# Patient Record
Sex: Female | Born: 1980
Health system: Southern US, Community
[De-identification: ages and names within clinical notes are randomized; demographics above are authoritative.]

## PROBLEM LIST (undated history)

## (undated) ENCOUNTER — Inpatient Hospital Stay (HOSPITAL_COMMUNITY): Admission: AD | Payer: Federal, State, Local not specified - Other | Admitting: Psychiatry

## (undated) DIAGNOSIS — J45909 Unspecified asthma, uncomplicated: Secondary | ICD-10-CM

## (undated) DIAGNOSIS — K589 Irritable bowel syndrome without diarrhea: Secondary | ICD-10-CM

## (undated) DIAGNOSIS — J069 Acute upper respiratory infection, unspecified: Secondary | ICD-10-CM

## (undated) DIAGNOSIS — T8859XA Other complications of anesthesia, initial encounter: Secondary | ICD-10-CM

## (undated) DIAGNOSIS — S069X9A Unspecified intracranial injury with loss of consciousness of unspecified duration, initial encounter: Secondary | ICD-10-CM

## (undated) DIAGNOSIS — G4730 Sleep apnea, unspecified: Secondary | ICD-10-CM

## (undated) DIAGNOSIS — J309 Allergic rhinitis, unspecified: Secondary | ICD-10-CM

## (undated) DIAGNOSIS — F329 Major depressive disorder, single episode, unspecified: Secondary | ICD-10-CM

## (undated) DIAGNOSIS — F988 Other specified behavioral and emotional disorders with onset usually occurring in childhood and adolescence: Secondary | ICD-10-CM

## (undated) DIAGNOSIS — R519 Headache, unspecified: Secondary | ICD-10-CM

## (undated) DIAGNOSIS — K5900 Constipation, unspecified: Secondary | ICD-10-CM

## (undated) DIAGNOSIS — R5383 Other fatigue: Secondary | ICD-10-CM

## (undated) DIAGNOSIS — J039 Acute tonsillitis, unspecified: Secondary | ICD-10-CM

## (undated) DIAGNOSIS — R4020 Unspecified coma: Secondary | ICD-10-CM

## (undated) DIAGNOSIS — R51 Headache: Secondary | ICD-10-CM

## (undated) DIAGNOSIS — D649 Anemia, unspecified: Secondary | ICD-10-CM

## (undated) DIAGNOSIS — T4145XA Adverse effect of unspecified anesthetic, initial encounter: Secondary | ICD-10-CM

## (undated) DIAGNOSIS — F411 Generalized anxiety disorder: Secondary | ICD-10-CM

## (undated) DIAGNOSIS — R5381 Other malaise: Secondary | ICD-10-CM

## (undated) DIAGNOSIS — L989 Disorder of the skin and subcutaneous tissue, unspecified: Secondary | ICD-10-CM

## (undated) DIAGNOSIS — H6690 Otitis media, unspecified, unspecified ear: Secondary | ICD-10-CM

## (undated) DIAGNOSIS — E785 Hyperlipidemia, unspecified: Secondary | ICD-10-CM

## (undated) HISTORY — DX: Major depressive disorder, single episode, unspecified: F32.9

## (undated) HISTORY — DX: Hyperlipidemia, unspecified: E78.5

## (undated) HISTORY — DX: Acute tonsillitis, unspecified: J03.90

## (undated) HISTORY — DX: Allergic rhinitis, unspecified: J30.9

## (undated) HISTORY — DX: Otitis media, unspecified, unspecified ear: H66.90

## (undated) HISTORY — DX: Other malaise: R53.81

## (undated) HISTORY — DX: Disorder of the skin and subcutaneous tissue, unspecified: L98.9

## (undated) HISTORY — DX: Constipation, unspecified: K59.00

## (undated) HISTORY — DX: Generalized anxiety disorder: F41.1

## (undated) HISTORY — DX: Acute upper respiratory infection, unspecified: J06.9

## (undated) HISTORY — DX: Other specified behavioral and emotional disorders with onset usually occurring in childhood and adolescence: F98.8

## (undated) HISTORY — DX: Other fatigue: R53.83

## (undated) HISTORY — PX: OTHER SURGICAL HISTORY: SHX169

---
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
## 2002-11-13 ENCOUNTER — Other Ambulatory Visit: Admission: RE | Admit: 2002-11-13 | Discharge: 2002-11-13 | Payer: Self-pay | Admitting: Obstetrics and Gynecology

## 2003-05-16 ENCOUNTER — Inpatient Hospital Stay (HOSPITAL_COMMUNITY): Admission: AC | Admit: 2003-05-16 | Discharge: 2003-05-22 | Payer: Self-pay

## 2003-05-26 ENCOUNTER — Emergency Department (HOSPITAL_COMMUNITY): Admission: EM | Admit: 2003-05-26 | Discharge: 2003-05-26 | Payer: Self-pay | Admitting: Emergency Medicine

## 2004-09-11 ENCOUNTER — Ambulatory Visit: Payer: Self-pay | Admitting: Internal Medicine

## 2005-12-22 ENCOUNTER — Ambulatory Visit: Payer: Self-pay | Admitting: Internal Medicine

## 2007-01-27 HISTORY — PX: CERVICAL POLYPECTOMY: SHX88

## 2007-03-30 ENCOUNTER — Encounter: Admission: RE | Admit: 2007-03-30 | Discharge: 2007-03-30 | Payer: Self-pay | Admitting: Internal Medicine

## 2007-09-28 ENCOUNTER — Encounter: Admission: RE | Admit: 2007-09-28 | Discharge: 2007-09-28 | Payer: Self-pay | Admitting: Internal Medicine

## 2008-09-26 ENCOUNTER — Ambulatory Visit: Payer: Self-pay | Admitting: Internal Medicine

## 2008-09-26 DIAGNOSIS — L989 Disorder of the skin and subcutaneous tissue, unspecified: Secondary | ICD-10-CM | POA: Insufficient documentation

## 2008-09-26 DIAGNOSIS — K5900 Constipation, unspecified: Secondary | ICD-10-CM

## 2008-09-26 DIAGNOSIS — J309 Allergic rhinitis, unspecified: Secondary | ICD-10-CM

## 2008-09-26 DIAGNOSIS — F329 Major depressive disorder, single episode, unspecified: Secondary | ICD-10-CM

## 2008-09-26 DIAGNOSIS — E785 Hyperlipidemia, unspecified: Secondary | ICD-10-CM | POA: Insufficient documentation

## 2008-09-26 DIAGNOSIS — F3289 Other specified depressive episodes: Secondary | ICD-10-CM

## 2008-09-26 DIAGNOSIS — F411 Generalized anxiety disorder: Secondary | ICD-10-CM | POA: Insufficient documentation

## 2008-09-26 DIAGNOSIS — F32A Depression, unspecified: Secondary | ICD-10-CM | POA: Insufficient documentation

## 2008-09-26 HISTORY — DX: Other specified depressive episodes: F32.89

## 2008-09-26 HISTORY — DX: Major depressive disorder, single episode, unspecified: F32.9

## 2008-09-26 HISTORY — DX: Disorder of the skin and subcutaneous tissue, unspecified: L98.9

## 2008-09-26 HISTORY — DX: Generalized anxiety disorder: F41.1

## 2008-09-26 HISTORY — DX: Allergic rhinitis, unspecified: J30.9

## 2008-09-26 HISTORY — DX: Hyperlipidemia, unspecified: E78.5

## 2008-09-26 HISTORY — DX: Constipation, unspecified: K59.00

## 2009-01-14 ENCOUNTER — Ambulatory Visit: Payer: Self-pay | Admitting: Internal Medicine

## 2009-01-14 DIAGNOSIS — R5381 Other malaise: Secondary | ICD-10-CM

## 2009-01-14 DIAGNOSIS — J039 Acute tonsillitis, unspecified: Secondary | ICD-10-CM

## 2009-01-14 DIAGNOSIS — R5383 Other fatigue: Secondary | ICD-10-CM

## 2009-01-14 DIAGNOSIS — H6690 Otitis media, unspecified, unspecified ear: Secondary | ICD-10-CM | POA: Insufficient documentation

## 2009-01-14 HISTORY — DX: Other fatigue: R53.83

## 2009-01-14 HISTORY — DX: Otitis media, unspecified, unspecified ear: H66.90

## 2009-01-14 HISTORY — DX: Other malaise: R53.81

## 2009-01-14 HISTORY — DX: Acute tonsillitis, unspecified: J03.90

## 2009-10-07 ENCOUNTER — Ambulatory Visit (HOSPITAL_COMMUNITY)
Admission: RE | Admit: 2009-10-07 | Discharge: 2009-10-07 | Payer: Self-pay | Source: Home / Self Care | Admitting: Obstetrics & Gynecology

## 2009-10-22 ENCOUNTER — Ambulatory Visit: Payer: Self-pay | Admitting: Internal Medicine

## 2009-10-22 ENCOUNTER — Encounter (INDEPENDENT_AMBULATORY_CARE_PROVIDER_SITE_OTHER): Payer: Self-pay | Admitting: *Deleted

## 2009-10-22 DIAGNOSIS — F988 Other specified behavioral and emotional disorders with onset usually occurring in childhood and adolescence: Secondary | ICD-10-CM | POA: Insufficient documentation

## 2009-10-22 DIAGNOSIS — J069 Acute upper respiratory infection, unspecified: Secondary | ICD-10-CM | POA: Insufficient documentation

## 2009-10-22 HISTORY — DX: Acute upper respiratory infection, unspecified: J06.9

## 2009-10-22 HISTORY — DX: Other specified behavioral and emotional disorders with onset usually occurring in childhood and adolescence: F98.8

## 2009-12-16 ENCOUNTER — Ambulatory Visit: Payer: Self-pay | Admitting: Internal Medicine

## 2010-02-16 ENCOUNTER — Encounter: Payer: Self-pay | Admitting: Infectious Diseases
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
## 2010-02-25 NOTE — Letter (Signed)
Summary: Out of Work  [facility]  [address] [place], [place] [address]   Phone: [phone]  Fax: [phone]    December 16, 2009   Employee:  [employer]    To Whom It May Concern:   For Medical reasons, please excuse the above named employee from work for the following dates:  Start:   Monday, Nov 21  End:   Thursday, Nov 24  If you need additional information, please feel free to contact our office.         Sincerely,    Jacques Navy MD
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
## 2010-02-25 NOTE — Letter (Signed)
Summary: Out of Work  [facility]  [address] [place], [place] [address]   Phone: [phone]  Fax: [phone]    October 22, 2009   Employee:  [name] [employer] [facility] [employer] [facility]    To Whom It May Concern:   For Medical reasons, please excuse the above named employee from work for the following dates:  Start:   10/21/2009  End:   10/21/2009  If you need additional information, please feel free to contact our office.         Sincerely,    DR. Oliver Barre

## 2010-04-10 LAB — CBC
Hemoglobin: 13.3 g/dL (ref 12.0–15.0)
MCH: 32.3 pg (ref 26.0–34.0)
MCHC: 34.5 g/dL (ref 30.0–36.0)
MCV: 93.5 fL (ref 78.0–100.0)
RBC: 4.12 MIL/uL (ref 3.87–5.11)

## 2010-04-10 LAB — PREGNANCY, URINE: Preg Test, Ur: NEGATIVE

## 2010-04-25 ENCOUNTER — Emergency Department (HOSPITAL_COMMUNITY)
Admission: EM | Admit: 2010-04-25 | Discharge: 2010-04-25 | Disposition: A | Discharge status: Home / Self Care | Payer: Managed Care, Other (non HMO) | Attending: Emergency Medicine | Admitting: Emergency Medicine

## 2010-04-25 DIAGNOSIS — F411 Generalized anxiety disorder: Secondary | ICD-10-CM | POA: Insufficient documentation

## 2010-04-25 DIAGNOSIS — R002 Palpitations: Secondary | ICD-10-CM | POA: Insufficient documentation

## 2010-04-25 DIAGNOSIS — R259 Unspecified abnormal involuntary movements: Secondary | ICD-10-CM | POA: Insufficient documentation

## 2010-04-25 LAB — URINALYSIS, ROUTINE W REFLEX MICROSCOPIC
Bilirubin Urine: NEGATIVE
Ketones, ur: NEGATIVE mg/dL
Protein, ur: NEGATIVE mg/dL
Urobilinogen, UA: 0.2 mg/dL (ref 0.0–1.0)

## 2010-04-25 LAB — POCT PREGNANCY, URINE: Preg Test, Ur: NEGATIVE

## 2010-04-30 ENCOUNTER — Encounter: Payer: Self-pay | Admitting: Internal Medicine

## 2010-04-30 ENCOUNTER — Ambulatory Visit (INDEPENDENT_AMBULATORY_CARE_PROVIDER_SITE_OTHER): Payer: Managed Care, Other (non HMO) | Admitting: Internal Medicine

## 2010-04-30 DIAGNOSIS — F411 Generalized anxiety disorder: Secondary | ICD-10-CM

## 2010-04-30 DIAGNOSIS — N390 Urinary tract infection, site not specified: Secondary | ICD-10-CM | POA: Insufficient documentation

## 2010-04-30 DIAGNOSIS — N63 Unspecified lump in unspecified breast: Secondary | ICD-10-CM

## 2010-04-30 DIAGNOSIS — F900 Attention-deficit hyperactivity disorder, predominantly inattentive type: Secondary | ICD-10-CM | POA: Insufficient documentation

## 2010-04-30 DIAGNOSIS — N6320 Unspecified lump in the left breast, unspecified quadrant: Secondary | ICD-10-CM

## 2010-04-30 DIAGNOSIS — F988 Other specified behavioral and emotional disorders with onset usually occurring in childhood and adolescence: Secondary | ICD-10-CM

## 2010-04-30 MED ORDER — ESCITALOPRAM OXALATE 10 MG PO TABS: 10.0000 mg | ORAL_TABLET | Freq: Every day | ORAL | Status: DC

## 2010-04-30 MED ORDER — AMPHETAMINE-DEXTROAMPHET ER 20 MG PO CP24: 20.0000 mg | ORAL_CAPSULE | Freq: Two times a day (BID) | ORAL | Status: DC

## 2010-04-30 MED ORDER — CEPHALEXIN 500 MG PO CAPS: 500.0000 mg | ORAL_CAPSULE | Freq: Three times a day (TID) | ORAL | Status: AC
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
## 2010-04-30 NOTE — Patient Instructions (Signed)
Take all new medications as prescribed Continue all other medications as before You will be contacted regarding the referral for: diagnostic mammogram Please return in 6 mo, or sooner if needed

## 2010-05-01 ENCOUNTER — Telehealth: Payer: Self-pay

## 2010-05-01 ENCOUNTER — Encounter: Payer: Self-pay | Admitting: Internal Medicine

## 2010-05-01 DIAGNOSIS — F988 Other specified behavioral and emotional disorders with onset usually occurring in childhood and adolescence: Secondary | ICD-10-CM

## 2010-05-01 MED ORDER — AMPHETAMINE-DEXTROAMPHET ER 20 MG PO CP24: 20.0000 mg | ORAL_CAPSULE | ORAL | Status: DC

## 2010-05-01 NOTE — Telephone Encounter (Signed)
Pt informed, Rx in cabinet for pt pick up  

## 2010-05-01 NOTE — Telephone Encounter (Signed)
rx changed to once daily   Done hardcopy to dahlia/LIM B

## 2010-05-01 NOTE — Telephone Encounter (Signed)
Pt called stating that pharmacy advised that her Insurance company will only allow pt #30 tablets Adderall because it is a XR tab an therefore should only be taken once daily, please advise.

## 2010-05-01 NOTE — Assessment & Plan Note (Signed)
To start tx with adderall as per emr, f/u any uncontrolled symptoms, call for any perceived side effect

## 2010-05-01 NOTE — Assessment & Plan Note (Signed)
To check urine studies today, tx with cephalexin course,  to f/u any worsening symptoms or concerns

## 2010-05-01 NOTE — Assessment & Plan Note (Signed)
Limited exam today per pt, but will order diag mammogram per request

## 2010-05-01 NOTE — Assessment & Plan Note (Signed)
Uncontrolled, to tx with lexapro after d/w pt today;  If has sexual side effect would consider else such as zoloft
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
## 2010-09-25 ENCOUNTER — Ambulatory Visit: Payer: Managed Care, Other (non HMO) | Admitting: Internal Medicine

## 2010-09-25 ENCOUNTER — Encounter: Payer: Self-pay | Admitting: Internal Medicine

## 2010-09-25 ENCOUNTER — Ambulatory Visit (INDEPENDENT_AMBULATORY_CARE_PROVIDER_SITE_OTHER): Payer: Managed Care, Other (non HMO) | Admitting: Internal Medicine

## 2010-09-25 VITALS — BP 110/82 | HR 90 | Temp 99.0°F | Ht 69.0 in | Wt 142.5 lb

## 2010-09-25 DIAGNOSIS — T7421XA Adult sexual abuse, confirmed, initial encounter: Secondary | ICD-10-CM

## 2010-09-25 DIAGNOSIS — F988 Other specified behavioral and emotional disorders with onset usually occurring in childhood and adolescence: Secondary | ICD-10-CM

## 2010-09-25 DIAGNOSIS — F411 Generalized anxiety disorder: Secondary | ICD-10-CM

## 2010-09-25 DIAGNOSIS — IMO0002 Reserved for concepts with insufficient information to code with codable children: Secondary | ICD-10-CM | POA: Insufficient documentation

## 2010-09-25 DIAGNOSIS — J019 Acute sinusitis, unspecified: Secondary | ICD-10-CM | POA: Insufficient documentation

## 2010-09-25 MED ORDER — AMPHETAMINE-DEXTROAMPHETAMINE 20 MG PO TABS: ORAL_TABLET | ORAL | Status: DC

## 2010-09-25 MED ORDER — AZITHROMYCIN 250 MG PO TABS: ORAL_TABLET | ORAL | Status: AC

## 2010-09-25 MED ORDER — ESCITALOPRAM OXALATE 10 MG PO TABS: 10.0000 mg | ORAL_TABLET | Freq: Every day | ORAL | Status: DC

## 2010-09-25 NOTE — Assessment & Plan Note (Signed)
Improved , to cont meds as is - to cont 1/2 - 1 po qd as is working well
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
## 2010-09-25 NOTE — Assessment & Plan Note (Signed)
Improved, though has some sleepiness/fatigue with the 10 mg lexapro; is feeling better and declines change at this time  - she plans to cont 1/2 - 1 po qd

## 2010-09-25 NOTE — Patient Instructions (Addendum)
Take all new medications as prescribed - the antibiotic (sent to the pharmacy) You are given the 3 month prescription for the lexapro if needed You are given the 3 months of dated prescriptions for the adderall as well Please call if you need further refills Please continue your counseling as you have planned You are given the work note today Please return in 6 months, or sooner if needed

## 2010-09-26 ENCOUNTER — Encounter: Payer: Self-pay | Admitting: Internal Medicine
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
## 2010-09-26 NOTE — Assessment & Plan Note (Signed)
Mild to mod, for antibx course,  to f/u any worsening symptoms or concerns 

## 2010-10-27 ENCOUNTER — Encounter: Payer: Self-pay | Admitting: Internal Medicine

## 2010-10-27 DIAGNOSIS — Z0000 Encounter for general adult medical examination without abnormal findings: Secondary | ICD-10-CM | POA: Insufficient documentation

## 2010-10-30 ENCOUNTER — Ambulatory Visit: Payer: Managed Care, Other (non HMO) | Admitting: Internal Medicine

## 2010-11-04 ENCOUNTER — Encounter: Payer: Self-pay | Admitting: Internal Medicine

## 2010-11-04 ENCOUNTER — Ambulatory Visit (INDEPENDENT_AMBULATORY_CARE_PROVIDER_SITE_OTHER): Payer: Managed Care, Other (non HMO) | Admitting: Internal Medicine

## 2010-11-04 ENCOUNTER — Other Ambulatory Visit (INDEPENDENT_AMBULATORY_CARE_PROVIDER_SITE_OTHER): Payer: Managed Care, Other (non HMO)

## 2010-11-04 VITALS — BP 100/62 | HR 75 | Temp 97.6°F | Ht 66.0 in | Wt 149.4 lb

## 2010-11-04 DIAGNOSIS — F988 Other specified behavioral and emotional disorders with onset usually occurring in childhood and adolescence: Secondary | ICD-10-CM

## 2010-11-04 DIAGNOSIS — Z0000 Encounter for general adult medical examination without abnormal findings: Secondary | ICD-10-CM

## 2010-11-04 DIAGNOSIS — F411 Generalized anxiety disorder: Secondary | ICD-10-CM

## 2010-11-04 DIAGNOSIS — J019 Acute sinusitis, unspecified: Secondary | ICD-10-CM

## 2010-11-04 LAB — BASIC METABOLIC PANEL
Calcium: 9 mg/dL (ref 8.4–10.5)
GFR: 114.87 mL/min (ref >60.00)
Glucose, Bld: 91 mg/dL (ref 70–99)
Sodium: 144 mEq/L (ref 135–145)

## 2010-11-04 LAB — URINALYSIS, ROUTINE W REFLEX MICROSCOPIC
Ketones, ur: NEGATIVE
Specific Gravity, Urine: >=1.03 (ref 1.000–1.030)
Urine Glucose: NEGATIVE
pH: 6 (ref 5.0–8.0)

## 2010-11-04 LAB — CBC WITH DIFFERENTIAL/PLATELET
Basophils Absolute: 0 10*3/uL (ref 0.0–0.1)
Eosinophils Relative: 1.9 % (ref 0.0–5.0)
HCT: 43 % (ref 36.0–46.0)
Hemoglobin: 14.1 g/dL (ref 12.0–15.0)
Lymphs Abs: 2.2 10*3/uL (ref 0.7–4.0)
Monocytes Relative: 7 % (ref 3.0–12.0)
Neutro Abs: 5.1 10*3/uL (ref 1.4–7.7)
RDW: 13.6 % (ref 11.5–14.6)

## 2010-11-04 LAB — HEPATIC FUNCTION PANEL
Albumin: 4.2 g/dL (ref 3.5–5.2)
Alkaline Phosphatase: 82 U/L (ref 39–117)

## 2010-11-04 MED ORDER — AZITHROMYCIN 250 MG PO TABS: ORAL_TABLET | ORAL | Status: AC

## 2010-11-04 MED ORDER — AMPHETAMINE-DEXTROAMPHET ER 20 MG PO CP24: 20.0000 mg | ORAL_CAPSULE | ORAL | Status: DC

## 2010-11-04 NOTE — Assessment & Plan Note (Signed)

## 2010-11-04 NOTE — Progress Notes (Signed)
Quick Note:  Voice message left on [employer] system - lab is negative, normal or otherwise stable, pt to continue same tx ______ 

## 2010-11-04 NOTE — Patient Instructions (Signed)
Take all new medications as prescribed - the antibiotic The adderall was corrected and given hardcopy to you today contj Please go to LAB in the Basement for the blood and/or urine tests to be done today Please call the phone number [phone] (the [employer] System) for results of testing in 2-3 days;  When calling, simply dial the number, and when prompted enter the MRN number above (the Medical Record Number) and the # key, then the message should start. Please call if you change your mind about the flu shot Please return in 1 year for your yearly visit, or sooner if needed, with Lab testing done 3-5 days before

## 2010-11-06 ENCOUNTER — Encounter: Payer: Self-pay | Admitting: Internal Medicine

## 2010-11-06 NOTE — Assessment & Plan Note (Signed)
stable overall by hx and exam, most recent data reviewed with pt, and pt to continue medical treatment as before, for refills today 

## 2010-11-06 NOTE — Assessment & Plan Note (Signed)
stable overall by hx and exam, most recent data reviewed with pt, and pt to continue medical treatment as before  Lab Results  Component Value Date   WBC 8.0 11/04/2010   HGB 14.1 11/04/2010   HCT 43.0 11/04/2010   PLT 205.0 11/04/2010   GLUCOSE 91 11/04/2010   ALT 30 11/04/2010   AST 24 11/04/2010   NA 144 11/04/2010   K 4.7 11/04/2010   CL 108 11/04/2010   CREATININE 0.7 11/04/2010   BUN 13 11/04/2010   CO2 30 11/04/2010   TSH 0.68 11/04/2010    

## 2010-11-06 NOTE — Progress Notes (Signed)
  Subjective:    Patient ID: [name], female    DOB: Jun 19, 1980, 30 y.o.   MRN: [id number]  HPI   Here for wellness and f/u;  Overall doing ok;  Pt denies CP, worsening SOB, DOE, wheezing, orthopnea, PND, worsening LE edema, palpitations, dizziness or syncope.  Pt denies neurological change such as new Headache, facial or extremity weakness.  Pt denies polydipsia, polyuria, or low sugar symptoms. Pt states overall good compliance with treatment and medications, good tolerability, and trying to follow lower cholesterol diet.  Pt denies worsening depressive symptoms, suicidal ideation or panic. No fever, wt loss, night sweats, loss of appetite, or other constitutional symptoms.  Pt states good ability with ADL's, low fall risk, home safety reviewed and adequate, no significant changes in hearing or vision, and occasionally active with exercise.   Here with 3 days acute onset fever, facial pain, pressure, general weakness and malaise, and greenish d/c, with slight ST, but little to no cough and Pt denies chest pain, increased sob or doe, wheezing, orthopnea, PND, increased LE swelling, palpitations, dizziness or syncope.  Pt denies new neurological symptoms such as new headache, or facial or extremity weakness or numbness   Pt denies polydipsia, polyuria.  Denies worsening depressive symptoms, suicidal ideation, or panic, though has ongoing anxiety, not increased recently.   ADD med working well. Past Medical History  Diagnosis Date  . HYPERLIPIDEMIA 09/26/2008  . ANXIETY 09/26/2008  . DEPRESSION 09/26/2008  . ADD 10/22/2009  . OTITIS MEDIA, LEFT 01/14/2009  . TONSILLITIS, ACUTE 01/14/2009  . URI 10/22/2009  . ALLERGIC RHINITIS 09/26/2008  . CONSTIPATION 09/26/2008  . SKIN LESION 09/26/2008  . FATIGUE 01/14/2009   No past surgical history on file.  reports that she has quit smoking. She does not have any smokeless tobacco history on file. She reports that she drinks alcohol. Her drug history not on  file. family history includes Diabetes in her other; Hypertension in her other; and Stroke in her other. No Known Allergies Current Outpatient Prescriptions on File Prior to Visit  Medication Sig Dispense Refill  . escitalopram (LEXAPRO) 10 MG tablet Take 1 tablet (10 mg total) by mouth daily.  90 tablet  3   Review of Systems Review of Systems  Constitutional: Negative for diaphoresis and unexpected weight change.  HENT: Negative for drooling and tinnitus.   Eyes: Negative for photophobia and visual disturbance.  Respiratory: Negative for choking and stridor.   Gastrointestinal: Negative for vomiting and blood in stool.  Genitourinary: Negative for hematuria and decreased urine volume.       Objective:   Physical Exam BP 100/62  Pulse 75  Temp(Src) 97.6 F (36.4 C) (Oral)  Ht 5\' 6"  (1.676 m)  Wt 149 lb 6.4 oz (67.767 kg)  BMI 24.11 kg/m2  SpO2 99% Physical Exam  VS noted Constitutional: Pt appears well-developed and well-nourished.  HENT: Head: Normocephalic.  Right Ear: External ear normal.  Left Ear: External ear normal.  Bilat tm's mild erythema.  Sinus tender bilat.  Pharynx mild erythema Eyes: Conjunctivae and EOM are normal. Pupils are equal, round, and reactive to light.  Neck: Normal range of motion. Neck supple.  Cardiovascular: Normal rate and regular rhythm.   Pulmonary/Chest: Effort normal and breath sounds normal.  Neurological: Pt is alert. No cranial nerve deficit.  Skin: Skin is warm. No erythema.  Psychiatric: Pt behavior is normal. Thought content normal. 1+ nervous, not depressed affect    Assessment & Plan:

## 2010-11-06 NOTE — Assessment & Plan Note (Signed)
Mild to mod, for antibx course,  to f/u any worsening symptoms or concerns 

## 2011-01-09 ENCOUNTER — Encounter: Payer: Self-pay | Admitting: Internal Medicine

## 2011-01-09 ENCOUNTER — Ambulatory Visit (INDEPENDENT_AMBULATORY_CARE_PROVIDER_SITE_OTHER): Payer: Managed Care, Other (non HMO) | Admitting: Internal Medicine

## 2011-01-09 VITALS — BP 110/66 | HR 83 | Temp 98.0°F | Ht 69.0 in | Wt 149.1 lb

## 2011-01-09 DIAGNOSIS — F329 Major depressive disorder, single episode, unspecified: Secondary | ICD-10-CM

## 2011-01-09 DIAGNOSIS — F988 Other specified behavioral and emotional disorders with onset usually occurring in childhood and adolescence: Secondary | ICD-10-CM

## 2011-01-09 DIAGNOSIS — J019 Acute sinusitis, unspecified: Secondary | ICD-10-CM

## 2011-01-09 MED ORDER — AZITHROMYCIN 250 MG PO TABS: ORAL_TABLET | ORAL | Status: AC

## 2011-01-09 MED ORDER — AMPHETAMINE-DEXTROAMPHET ER 20 MG PO CP24: 20.0000 mg | ORAL_CAPSULE | ORAL | Status: DC

## 2011-01-09 MED ORDER — AMPHETAMINE-DEXTROAMPHET ER 20 MG PO CP24
20.0000 mg | ORAL_CAPSULE | ORAL | Status: DC
Start: 2011-01-09 — End: 2011-07-09

## 2011-01-09 NOTE — Patient Instructions (Signed)
Take all new medications as prescribed Continue all other medications as before You are given the adderall refills today You are given the work note

## 2011-01-10 ENCOUNTER — Encounter: Payer: Self-pay | Admitting: Internal Medicine

## 2011-01-10 NOTE — Progress Notes (Signed)
  Subjective:    Patient ID: [name], female    DOB: [birth year], 30 y.o.   MRN: [id number]  HPI   Here with 3 days acute onset fever, facial pain, pressure, general weakness and malaise, and greenish d/c, with slight ST, but little to no cough and Pt denies chest pain, increased sob or doe, wheezing, orthopnea, PND, increased LE swelling, palpitations, dizziness or syncope.  ADD symtpoms well controlled with ability to work/social without signficant difficulty with concentration/task completion.  Denies worsening depressive symptoms, suicidal ideation, or panic, though has ongoing anxiety, not increased recently.   Pt denies new neurological symptoms such as new headache, or facial or extremity weakness or numbness   Pt denies polydipsia, polyuria. Past Medical History  Diagnosis Date  . HYPERLIPIDEMIA 09/26/2008  . ANXIETY 09/26/2008  . DEPRESSION 09/26/2008  . ADD 10/22/2009  . OTITIS MEDIA, LEFT 01/14/2009  . TONSILLITIS, ACUTE 01/14/2009  . URI 10/22/2009  . ALLERGIC RHINITIS 09/26/2008  . CONSTIPATION 09/26/2008  . SKIN LESION 09/26/2008  . FATIGUE 01/14/2009   No past surgical history on file.  reports that she has quit smoking. She does not have any smokeless tobacco history on file. She reports that she drinks alcohol. Her drug history not on file. family history includes Diabetes in her other; Hypertension in her other; and Stroke in her other. No Known Allergies Current Outpatient Prescriptions on File Prior to Visit  Medication Sig Dispense Refill  . escitalopram (LEXAPRO) 10 MG tablet Take 1 tablet (10 mg total) by mouth daily.  90 tablet  3   Review of Systems Review of Systems  Constitutional: Negative for diaphoresis and unexpected weight change.  HENT: Negative for drooling and tinnitus.   Eyes: Negative for photophobia and visual disturbance.  Respiratory: Negative for choking and stridor.   Gastrointestinal: Negative for vomiting and blood in stool.  Genitourinary: Negative  for hematuria and decreased urine volume. .       Objective:   Physical Exam BP 110/66  Pulse 83  Temp(Src) 98 F (36.7 C) (Oral)  Ht 5\' 9"  (1.753 m)  Wt 149 lb 1.9 oz (67.64 kg)  BMI 22.02 kg/m2  SpO2 99% Physical Exam  VS noted.mild ill appaering Constitutional: Pt appears well-developed and well-nourished.  HENT: Head: Normocephalic.  Right Ear: External ear normal.  Left Ear: External ear normal.  Bilat tm's mild erythema.  Sinus tender.  Pharynx mild erythema Eyes: Conjunctivae and EOM are normal. Pupils are equal, round, and reactive to light.  Neck: Normal range of motion. Neck supple.  Cardiovascular: Normal rate and regular rhythm.   Pulmonary/Chest: Effort normal and breath sounds normal.  Neurological: Pt is alert. No cranial nerve deficit.  Skin: Skin is warm. No erythema.  Psychiatric: Pt behavior is normal. Thought content normal. not depressed affect, slight nervous only    Assessment & Plan:

## 2011-01-10 NOTE — Assessment & Plan Note (Addendum)
stable overall by hx and exam,, and pt to continue medical treatment as before , refills done today 

## 2011-01-10 NOTE — Assessment & Plan Note (Signed)
Mild to mod, for antibx course,  to f/u any worsening symptoms or concerns 

## 2011-01-10 NOTE — Assessment & Plan Note (Signed)
stable overall by hx and exam, most recent data reviewed with pt, and pt to continue medical treatment as before  Lab Results  Component Value Date   WBC 8.0 11/04/2010   HGB 14.1 11/04/2010   HCT 43.0 11/04/2010   PLT 205.0 11/04/2010   GLUCOSE 91 11/04/2010   ALT 30 11/04/2010   AST 24 11/04/2010   NA 144 11/04/2010   K 4.7 11/04/2010   CL 108 11/04/2010   CREATININE 0.7 11/04/2010   BUN 13 11/04/2010   CO2 30 11/04/2010   TSH 0.68 11/04/2010

## 2011-03-10 ENCOUNTER — Telehealth: Payer: Self-pay | Admitting: Internal Medicine

## 2011-03-10 ENCOUNTER — Ambulatory Visit: Payer: Managed Care, Other (non HMO) | Admitting: Endocrinology

## 2011-03-10 DIAGNOSIS — Z0289 Encounter for other administrative examinations: Secondary | ICD-10-CM

## 2011-03-10 NOTE — Telephone Encounter (Signed)
The pt is under the impression she has a refill available for an antibiotic and is hoping to get that called in.  She decided not to come into her appt for today, and has made one for Thursday.  Thanks!

## 2011-03-10 NOTE — Telephone Encounter (Signed)
Sorry, we normally dont  rx for antibx without OV for appropriateness, consider urgent care or ER if not able to come to OV

## 2011-03-11 NOTE — Telephone Encounter (Signed)
Called left message to call back 

## 2011-03-11 NOTE — Telephone Encounter (Signed)
Patient has appointment on 03/12/2011 at 2:15 with JWJ.

## 2011-03-12 ENCOUNTER — Ambulatory Visit: Payer: Managed Care, Other (non HMO) | Admitting: Internal Medicine

## 2011-03-12 DIAGNOSIS — Z0289 Encounter for other administrative examinations: Secondary | ICD-10-CM

## 2011-03-31 ENCOUNTER — Ambulatory Visit (INDEPENDENT_AMBULATORY_CARE_PROVIDER_SITE_OTHER): Payer: Managed Care, Other (non HMO) | Admitting: Internal Medicine

## 2011-03-31 ENCOUNTER — Encounter: Payer: Self-pay | Admitting: *Deleted

## 2011-03-31 ENCOUNTER — Encounter: Payer: Self-pay | Admitting: Internal Medicine

## 2011-03-31 VITALS — BP 130/82 | HR 89 | Temp 97.9°F | Ht 69.0 in | Wt 149.2 lb

## 2011-03-31 DIAGNOSIS — F988 Other specified behavioral and emotional disorders with onset usually occurring in childhood and adolescence: Secondary | ICD-10-CM

## 2011-03-31 DIAGNOSIS — J019 Acute sinusitis, unspecified: Secondary | ICD-10-CM

## 2011-03-31 DIAGNOSIS — F411 Generalized anxiety disorder: Secondary | ICD-10-CM

## 2011-03-31 MED ORDER — ESCITALOPRAM OXALATE 10 MG PO TABS: ORAL_TABLET | ORAL | Status: DC

## 2011-03-31 MED ORDER — CLONAZEPAM 0.5 MG PO TABS: 0.5000 mg | ORAL_TABLET | Freq: Every day | ORAL | Status: DC | PRN

## 2011-03-31 MED ORDER — AMPHETAMINE-DEXTROAMPHETAMINE 10 MG PO TABS: 10.0000 mg | ORAL_TABLET | Freq: Every day | ORAL | Status: DC

## 2011-03-31 MED ORDER — AZITHROMYCIN 250 MG PO TABS: ORAL_TABLET | ORAL | Status: AC

## 2011-03-31 NOTE — Progress Notes (Signed)
  Subjective:    Patient ID: [name], female    DOB: Apr 27, 1980, 31 y.o.   MRN: [id number]  HPI   Here with 3 days acute onset fever, facial pain, pressure, general weakness and malaise, and greenish d/c, with slight ST, but little to no cough and Pt denies chest pain, increased sob or doe, wheezing, orthopnea, PND, increased LE swelling, palpitations, dizziness or syncope.  Denies worsening depressive symptoms, suicidal ideation, or panic, though has ongoing anxiety, increased recetnly with work stress, also with occasional diarrhea intermittent for 1 wk, adn difficulty getting to sleep.  Adderall can sometimes makes getting to sleep more difficult. Past Medical History  Diagnosis Date  . HYPERLIPIDEMIA 09/26/2008  . ANXIETY 09/26/2008  . DEPRESSION 09/26/2008  . ADD 10/22/2009  . OTITIS MEDIA, LEFT 01/14/2009  . TONSILLITIS, ACUTE 01/14/2009  . URI 10/22/2009  . ALLERGIC RHINITIS 09/26/2008  . CONSTIPATION 09/26/2008  . SKIN LESION 09/26/2008  . FATIGUE 01/14/2009   No past surgical history on file.  reports that she has quit smoking. She does not have any smokeless tobacco history on file. She reports that she drinks alcohol. Her drug history not on file. family history includes Diabetes in her other; Hypertension in her other; and Stroke in her other. No Known Allergies No current outpatient prescriptions on file prior to visit.   Review of Systems Review of Systems  Constitutional: Negative for diaphoresis and unexpected weight change.  HENT: Negative for drooling and tinnitus.   Eyes: Negative for photophobia and visual disturbance.  Respiratory: Negative for choking and stridor.   Gastrointestinal: Negative for vomiting and blood in stool.  Genitourinary: Negative for hematuria and decreased urine volume.     Objective:   Physical Exam BP 130/82  Pulse 89  Temp(Src) 97.9 F (36.6 C) (Oral)  Ht 5\' 9"  (1.753 m)  Wt 149 lb 4 oz (67.699 kg)  BMI 22.04 kg/m2  SpO2 99% Physical  Exam  VS noted, mild ill Constitutional: Pt appears well-developed and well-nourished.  HENT: Head: Normocephalic.  Right Ear: External ear normal.  Left Ear: External ear normal.  Bilat tm's mild erythema.  Sinus tender bilat.  Pharynx mild erythema Eyes: Conjunctivae and EOM are normal. Pupils are equal, round, and reactive to light.  Neck: Normal range of motion. Neck supple.  Cardiovascular: Normal rate and regular rhythm.   Pulmonary/Chest: Effort normal and breath sounds normal.  Neurological: Pt is alert. No cranial nerve deficit.  Skin: Skin is warm. No erythema.  Psychiatric: Pt behavior is normal. Thought content normal. 1+ nervous    Assessment & Plan:

## 2011-03-31 NOTE — Assessment & Plan Note (Addendum)
Ok to incresae the lexapro to 15 mg daily,  to f/u any worsening symptoms or concerns, encouraged to contiune counseling which she has not done for several months; for klonopin prn more severe symtpoms

## 2011-03-31 NOTE — Assessment & Plan Note (Signed)
Mild to mod, for antibx course,  to f/u any worsening symptoms or concerns 

## 2011-03-31 NOTE — Patient Instructions (Addendum)
Ok to increase the lexapro 10 mg to 1 and 1/2 pills per day Take all new medications as prescribed - the clonazepam as needed only for nerves, and the antibiotic Decrease the adderal to 10 mg per day Continue all other medications as before

## 2011-03-31 NOTE — Assessment & Plan Note (Signed)
Ok to decr the adderal to 10 mg daily,  to f/u any worsening symptoms or concerns

## 2011-05-15 ENCOUNTER — Ambulatory Visit: Payer: Managed Care, Other (non HMO) | Admitting: Internal Medicine

## 2011-05-15 DIAGNOSIS — Z0289 Encounter for other administrative examinations: Secondary | ICD-10-CM

## 2011-05-25 ENCOUNTER — Other Ambulatory Visit: Payer: Self-pay

## 2011-05-25 ENCOUNTER — Encounter: Payer: Self-pay | Admitting: Internal Medicine

## 2011-05-25 ENCOUNTER — Ambulatory Visit: Payer: Managed Care, Other (non HMO) | Admitting: Internal Medicine

## 2011-05-25 ENCOUNTER — Ambulatory Visit (INDEPENDENT_AMBULATORY_CARE_PROVIDER_SITE_OTHER): Payer: Managed Care, Other (non HMO) | Admitting: Internal Medicine

## 2011-05-25 VITALS — BP 122/78 | HR 91 | Temp 97.7°F

## 2011-05-25 DIAGNOSIS — J309 Allergic rhinitis, unspecified: Secondary | ICD-10-CM

## 2011-05-25 DIAGNOSIS — F411 Generalized anxiety disorder: Secondary | ICD-10-CM

## 2011-05-25 DIAGNOSIS — B349 Viral infection, unspecified: Secondary | ICD-10-CM

## 2011-05-25 DIAGNOSIS — R42 Dizziness and giddiness: Secondary | ICD-10-CM

## 2011-05-25 DIAGNOSIS — B9789 Other viral agents as the cause of diseases classified elsewhere: Secondary | ICD-10-CM

## 2011-05-25 MED ORDER — CLONAZEPAM 0.5 MG PO TABS: 0.5000 mg | ORAL_TABLET | Freq: Every day | ORAL | Status: DC | PRN

## 2011-05-25 MED ORDER — MECLIZINE HCL 12.5 MG PO TABS: 12.5000 mg | ORAL_TABLET | Freq: Three times a day (TID) | ORAL | Status: AC | PRN

## 2011-05-25 MED ORDER — FLUTICASONE PROPIONATE 50 MCG/ACT NA SUSP: 2.0000 | Freq: Every day | NASAL | Status: DC

## 2011-05-25 MED ORDER — FEXOFENADINE HCL 180 MG PO TABS: 180.0000 mg | ORAL_TABLET | Freq: Every day | ORAL | Status: DC

## 2011-05-25 NOTE — Patient Instructions (Signed)
Take all new medications as prescribed Continue all other medications as before You are given the work note today You are given the refill today as requested

## 2011-05-25 NOTE — Assessment & Plan Note (Signed)
Due to current illness, for meclizine prn,  to f/u any worsening symptoms or concerns

## 2011-05-25 NOTE — Assessment & Plan Note (Signed)
Mild to mod, already essentially resolved,  to f/u any worsening symptoms or concerns

## 2011-05-25 NOTE — Assessment & Plan Note (Signed)
Mild to mod, for allegra/flonase,  to f/u any worsening symptoms or concerns 

## 2011-05-25 NOTE — Progress Notes (Signed)
Subjective:    Patient ID: [name], female    DOB: 20-Aug-1980, 31 y.o.   MRN: [id number]  HPI  Here with 2-3 days onset low grade temp, URI symtpoms, nausea and several loose stools now mostly resolved by time of her visit today.  Does have several wks ongoing nasal allergy symptoms with clear congestion, itch and sneeze, without fever, pain, ST, cough or wheezing.  Also with intermittent vertigo with turning eyes to the left.  Pt denies chest pain, increased sob or doe, wheezing, orthopnea, PND, increased LE swelling, palpitations, dizziness or syncope other than above.  Pt denies new neurological symptoms such as new headache, or facial or extremity weakness or numbness.   Pt denies polydipsia, polyuria.  Denies worsening depressive symptoms, suicidal ideation, or panic, though has ongoing anxiety, not increased recently.  Past Medical History  Diagnosis Date  . HYPERLIPIDEMIA 09/26/2008  . ANXIETY 09/26/2008  . DEPRESSION 09/26/2008  . ADD 10/22/2009  . OTITIS MEDIA, LEFT 01/14/2009  . TONSILLITIS, ACUTE 01/14/2009  . URI 10/22/2009  . ALLERGIC RHINITIS 09/26/2008  . CONSTIPATION 09/26/2008  . SKIN LESION 09/26/2008  . FATIGUE 01/14/2009   No past surgical history on file.  reports that she has quit smoking. She does not have any smokeless tobacco history on file. She reports that she drinks alcohol. Her drug history not on file. family history includes Diabetes in her other; Hypertension in her other; and Stroke in her other. No Known Allergies Current Outpatient Prescriptions on File Prior to Visit  Medication Sig Dispense Refill  . amphetamine-dextroamphetamine (ADDERALL XR, 20MG ,) 20 MG 24 hr capsule Take 1 capsule (20 mg total) by mouth every morning. To fill Apr 03, 2011  30 capsule  0  . amphetamine-dextroamphetamine (ADDERALL) 10 MG tablet Take 1 tablet (10 mg total) by mouth daily.  30 tablet  0  . escitalopram (LEXAPRO) 10 MG tablet Take 1 and 1/2 tabs per day  135 tablet  3  .  fexofenadine (ALLEGRA) 180 MG tablet Take 1 tablet (180 mg total) by mouth daily.  30 tablet  2  . fluticasone (FLONASE) 50 MCG/ACT nasal spray Place 2 sprays into the nose daily.  16 g  2  . DISCONTD: clonazePAM (KLONOPIN) 0.5 MG tablet Take 1 tablet (0.5 mg total) by mouth daily as needed for anxiety.  30 tablet  1   Review of Systems Review of Systems  Constitutional: Negative for diaphoresis and unexpected weight change.  HENT: Negative for drooling and tinnitus.   Eyes: Negative for photophobia and visual disturbance.  Respiratory: Negative for choking and stridor.   Gastrointestinal: Negative for vomiting and blood in stool.  Genitourinary: Negative for hematuria and decreased urine volume.  Musculoskeletal: Negative for gait problem.  Skin: Negative for color change and wound.     Objective:   Physical Exam BP 122/78  Pulse 91  Temp(Src) 97.7 F (36.5 C) (Oral)  SpO2 95% Physical Exam  VS noted, not ill appearing Constitutional: Pt appears well-developed and well-nourished.  HENT: Head: Normocephalic.  Right Ear: External ear normal.  Left Ear: External ear normal.  Bilat tm's mild erythema.  Sinus nontender.  Pharynx mild erythema Eyes: Conjunctivae and EOM are normal. Pupils are equal, round, and reactive to light.  Neck: Normal range of motion. Neck supple.  Cardiovascular: Normal rate and regular rhythm.   Pulmonary/Chest: Effort normal and breath sounds normal.  Abd:  Soft, NT, non-distended, + BS - benign Neurological: Pt is alert. Not confused, cn  intact, motor/dtr/gait intact  Skin: Skin is warm. No erythema.  Psychiatric: Pt behavior is normal. Thought content normal. not depressed affect    Assessment & Plan:

## 2011-05-25 NOTE — Assessment & Plan Note (Signed)
stable overall by hx and exam, most recent data reviewed with pt, and pt to continue medical treatment as before  Lab Results  Component Value Date   WBC 8.0 11/04/2010   HGB 14.1 11/04/2010   HCT 43.0 11/04/2010   PLT 205.0 11/04/2010   GLUCOSE 91 11/04/2010   ALT 30 11/04/2010   AST 24 11/04/2010   NA 144 11/04/2010   K 4.7 11/04/2010   CL 108 11/04/2010   CREATININE 0.7 11/04/2010   BUN 13 11/04/2010   CO2 30 11/04/2010   TSH 0.68 11/04/2010    

## 2011-07-07 ENCOUNTER — Ambulatory Visit: Payer: Managed Care, Other (non HMO) | Admitting: Internal Medicine

## 2011-07-07 DIAGNOSIS — Z0289 Encounter for other administrative examinations: Secondary | ICD-10-CM

## 2011-07-09 ENCOUNTER — Encounter: Payer: Self-pay | Admitting: Internal Medicine

## 2011-07-09 ENCOUNTER — Ambulatory Visit (INDEPENDENT_AMBULATORY_CARE_PROVIDER_SITE_OTHER): Payer: Managed Care, Other (non HMO) | Admitting: Internal Medicine

## 2011-07-09 VITALS — BP 112/70 | HR 82 | Temp 97.2°F | Ht 69.0 in | Wt 146.5 lb

## 2011-07-09 DIAGNOSIS — F329 Major depressive disorder, single episode, unspecified: Secondary | ICD-10-CM

## 2011-07-09 DIAGNOSIS — F411 Generalized anxiety disorder: Secondary | ICD-10-CM

## 2011-07-09 DIAGNOSIS — F988 Other specified behavioral and emotional disorders with onset usually occurring in childhood and adolescence: Secondary | ICD-10-CM

## 2011-07-09 MED ORDER — AMPHETAMINE-DEXTROAMPHET ER 20 MG PO CP24: 20.0000 mg | ORAL_CAPSULE | Freq: Two times a day (BID) | ORAL | Status: DC

## 2011-07-09 MED ORDER — ESCITALOPRAM OXALATE 10 MG PO TABS: ORAL_TABLET | ORAL | Status: DC

## 2011-07-09 MED ORDER — CLONAZEPAM 0.5 MG PO TABS: 0.5000 mg | ORAL_TABLET | Freq: Every day | ORAL | Status: DC | PRN

## 2011-07-09 NOTE — Patient Instructions (Addendum)
Continue all other medications as before You are given the refills as reqeusted

## 2011-07-09 NOTE — Assessment & Plan Note (Signed)
stable overall by hx and exam, and pt to continue medical treatment as before 

## 2011-07-09 NOTE — Assessment & Plan Note (Signed)
stable overall by hx and exam, most recent data reviewed with pt, and pt to continue medical treatment as before  Lab Results  Component Value Date   WBC 8.0 11/04/2010   HGB 14.1 11/04/2010   HCT 43.0 11/04/2010   PLT 205.0 11/04/2010   GLUCOSE 91 11/04/2010   ALT 30 11/04/2010   AST 24 11/04/2010   NA 144 11/04/2010   K 4.7 11/04/2010   CL 108 11/04/2010   CREATININE 0.7 11/04/2010   BUN 13 11/04/2010   CO2 30 11/04/2010   TSH 0.68 11/04/2010    

## 2011-07-09 NOTE — Assessment & Plan Note (Signed)
Ok for adderal 20 ER bid - total x 3 mo -
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
## 2011-07-14 ENCOUNTER — Emergency Department (HOSPITAL_COMMUNITY)
Admission: EM | Admit: 2011-07-14 | Discharge: 2011-07-14 | Disposition: A | Discharge status: Home / Self Care | Payer: Managed Care, Other (non HMO) | Attending: Emergency Medicine | Admitting: Emergency Medicine

## 2011-07-14 DIAGNOSIS — Z8249 Family history of ischemic heart disease and other diseases of the circulatory system: Secondary | ICD-10-CM | POA: Insufficient documentation

## 2011-07-14 DIAGNOSIS — Z823 Family history of stroke: Secondary | ICD-10-CM | POA: Insufficient documentation

## 2011-07-14 DIAGNOSIS — S0180XA Unspecified open wound of other part of head, initial encounter: Secondary | ICD-10-CM | POA: Insufficient documentation

## 2011-07-14 DIAGNOSIS — F3289 Other specified depressive episodes: Secondary | ICD-10-CM | POA: Insufficient documentation

## 2011-07-14 DIAGNOSIS — E785 Hyperlipidemia, unspecified: Secondary | ICD-10-CM | POA: Insufficient documentation

## 2011-07-14 DIAGNOSIS — F988 Other specified behavioral and emotional disorders with onset usually occurring in childhood and adolescence: Secondary | ICD-10-CM | POA: Insufficient documentation

## 2011-07-14 DIAGNOSIS — F411 Generalized anxiety disorder: Secondary | ICD-10-CM | POA: Insufficient documentation

## 2011-07-14 DIAGNOSIS — Z87898 Personal history of other specified conditions: Secondary | ICD-10-CM | POA: Insufficient documentation

## 2011-07-14 DIAGNOSIS — IMO0002 Reserved for concepts with insufficient information to code with codable children: Secondary | ICD-10-CM

## 2011-07-14 DIAGNOSIS — Z87891 Personal history of nicotine dependence: Secondary | ICD-10-CM | POA: Insufficient documentation

## 2011-07-14 DIAGNOSIS — Z833 Family history of diabetes mellitus: Secondary | ICD-10-CM | POA: Insufficient documentation

## 2011-07-14 DIAGNOSIS — F329 Major depressive disorder, single episode, unspecified: Secondary | ICD-10-CM | POA: Insufficient documentation

## 2011-07-14 NOTE — Discharge Instructions (Signed)
Laceration Care, Adult °A laceration is a cut that goes through all layers of the skin. The cut goes into the tissue beneath the skin. °HOME CARE °For stitches (sutures) or staples: °· Keep the cut clean and dry.  °· If you have a bandage (dressing), change it at least once a day. Change the bandage if it gets wet or dirty, or as told by your doctor.  °· Wash the cut with soap and water 2 times a day. Rinse the cut with water. Pat it dry with a clean towel.  °· Put a thin layer of medicated cream on the cut as told by your doctor.  °· You may shower after the first 24 hours. Do not soak the cut in water until the stitches are removed.  °· Only take medicines as told by your doctor.  °· Have your stitches or staples removed as told by your doctor.  °For skin adhesive strips: °· Keep the cut clean and dry.  °· Do not get the strips wet. You may take a bath, but be careful to keep the cut dry.  °· If the cut gets wet, pat it dry with a clean towel.  °· The strips will fall off on their own. Do not remove the strips that are still stuck to the cut.  °For wound glue: °· You may shower or take baths. Do not soak or scrub the cut. Do not swim. Avoid heavy sweating until the glue falls off on its own. After a shower or bath, pat the cut dry with a clean towel.  °· Do not put medicine on your cut until the glue falls off.  °· If you have a bandage, do not put tape over the glue.  °· Avoid lots of sunlight or tanning lamps until the glue falls off. Put sunscreen on the cut for the first year to reduce your scar.  °· The glue will fall off on its own. Do not pick at the glue.  °You may need a tetanus shot if: °· You cannot remember when you had your last tetanus shot.  °· You have never had a tetanus shot.  °If you need a tetanus shot and you choose not to have one, you may get tetanus. Sickness from tetanus can be serious. °GET HELP RIGHT AWAY IF:  °· Your pain does not get better with medicine.  °· Your arm, hand, leg, or  foot loses feeling (numbness) or changes color.  °· Your cut is bleeding.  °· Your joint feels weak, or you cannot use your joint.  °· You have painful lumps on your body.  °· Your cut is red, puffy (swollen), or painful.  °· You have a red line on the skin near the cut.  °· You have yellowish-white fluid (pus) coming from the cut.  °· You have a fever.  °· You have a bad smell coming from the cut or bandage.  °· Your cut breaks open before or after stitches are removed.  °· You notice something coming out of the cut, such as wood or glass.  °· You cannot move a finger or toe.  °MAKE SURE YOU:  °· Understand these instructions.  °· Will watch your condition.  °· Will get help right away if you are not doing well or get worse.  °Document Released: 07/01/2007 Document Revised: 01/01/2011 Document Reviewed: 07/08/2010 °[employer]® Patient Information ©2012 [employer]. °
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
## 2011-07-14 NOTE — ED Notes (Signed)
[facility] came and saw the patient.
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
## 2011-07-16 ENCOUNTER — Telehealth: Payer: Self-pay

## 2011-07-16 NOTE — Telephone Encounter (Signed)
Received denial for patients Amphetamine/Dextroamphetamineer due to quainty higher than allowed amount. MD informed to call patient to inform and ask if ok to go back to once daily? Called the patient left message to call back

## 2011-07-17 NOTE — Telephone Encounter (Signed)
Called left message to call back 

## 2011-07-17 NOTE — Telephone Encounter (Signed)
Called the patient left message to call back 

## 2011-08-12 ENCOUNTER — Emergency Department (HOSPITAL_COMMUNITY)
Admission: EM | Admit: 2011-08-12 | Discharge: 2011-08-12 | Disposition: A | Discharge status: Home / Self Care | Payer: Managed Care, Other (non HMO) | Attending: Emergency Medicine | Admitting: Emergency Medicine

## 2011-08-12 ENCOUNTER — Encounter (HOSPITAL_COMMUNITY): Payer: Self-pay | Admitting: Emergency Medicine

## 2011-08-12 DIAGNOSIS — F4320 Adjustment disorder, unspecified: Secondary | ICD-10-CM | POA: Insufficient documentation

## 2011-08-12 LAB — POCT PREGNANCY, URINE: Preg Test, Ur: NEGATIVE

## 2011-08-12 LAB — BASIC METABOLIC PANEL
BUN: 10 mg/dL (ref 6–23)
Calcium: 9.4 mg/dL (ref 8.4–10.5)
Chloride: 105 mEq/L (ref 96–112)
Creatinine, Ser: 0.71 mg/dL (ref 0.50–1.10)
GFR calc Af Amer: >90 mL/min (ref >90)
GFR calc non Af Amer: >90 mL/min (ref >90)

## 2011-08-12 LAB — RAPID URINE DRUG SCREEN, HOSP PERFORMED
Amphetamines: NOT DETECTED
Opiates: NOT DETECTED

## 2011-08-12 LAB — URINALYSIS, ROUTINE W REFLEX MICROSCOPIC
Ketones, ur: NEGATIVE mg/dL
Leukocytes, UA: NEGATIVE
Nitrite: NEGATIVE
pH: 6 (ref 5.0–8.0)

## 2011-08-12 LAB — CBC
HCT: 43.6 % (ref 36.0–46.0)
MCH: 31.9 pg (ref 26.0–34.0)
MCHC: 33.5 g/dL (ref 30.0–36.0)
MCV: 95.2 fL (ref 78.0–100.0)
Platelets: 274 10*3/uL (ref 150–400)
RDW: 12.8 % (ref 11.5–15.5)

## 2011-08-12 LAB — ETHANOL: Alcohol, Ethyl (B): 87 mg/dL — ABNORMAL HIGH (ref 0–11)
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
## 2011-08-12 NOTE — ED Notes (Signed)
MD at bedside. 

## 2011-09-29 ENCOUNTER — Other Ambulatory Visit: Payer: Self-pay | Admitting: Internal Medicine

## 2011-09-30 ENCOUNTER — Other Ambulatory Visit: Payer: Self-pay

## 2011-09-30 MED ORDER — CLONAZEPAM 0.5 MG PO TABS: 0.5000 mg | ORAL_TABLET | Freq: Every day | ORAL | Status: DC | PRN

## 2011-09-30 NOTE — Telephone Encounter (Signed)
Done hardcopy to robin  

## 2011-09-30 NOTE — Telephone Encounter (Signed)
Faxed hardcopy to pharmacy. 

## 2011-11-23 ENCOUNTER — Encounter: Payer: Self-pay | Admitting: Internal Medicine

## 2011-11-23 ENCOUNTER — Ambulatory Visit (INDEPENDENT_AMBULATORY_CARE_PROVIDER_SITE_OTHER): Payer: Managed Care, Other (non HMO) | Admitting: Internal Medicine

## 2011-11-23 VITALS — BP 120/70 | HR 79 | Temp 97.0°F | Ht 69.0 in | Wt 160.1 lb

## 2011-11-23 DIAGNOSIS — F988 Other specified behavioral and emotional disorders with onset usually occurring in childhood and adolescence: Secondary | ICD-10-CM

## 2011-11-23 DIAGNOSIS — J019 Acute sinusitis, unspecified: Secondary | ICD-10-CM | POA: Insufficient documentation

## 2011-11-23 DIAGNOSIS — F411 Generalized anxiety disorder: Secondary | ICD-10-CM

## 2011-11-23 MED ORDER — AZITHROMYCIN 250 MG PO TABS: ORAL_TABLET | ORAL | Status: DC

## 2011-11-23 MED ORDER — AMPHETAMINE-DEXTROAMPHET ER 20 MG PO CP24: 20.0000 mg | ORAL_CAPSULE | ORAL | Status: DC

## 2011-11-23 NOTE — Progress Notes (Signed)
Subjective:    Patient ID: [name], female    DOB: 27-Apr-1980, 31 y.o.   MRN: [id number]  HPI   Here with 3 days acute onset fever, facial pain, pressure, general weakness and malaise, and greenish d/c, with slight ST, but little to no cough and Pt denies chest pain, increased sob or doe, wheezing, orthopnea, PND, increased LE swelling, palpitations, dizziness or syncope.  Pt denies new neurological symptoms such as new headache, or facial or extremity weakness or numbness   Pt denies polydipsia, polyuria.  Also mentions she has tried a trial off her adderall, but has been mentioned to by her physician employer that she appears to be continuing to have symtpoms of ADD with mult project starts with out finishing, and suggested she consider re-start treatment.  Pt has gained wt off the adderal as well, but was not overly losing wt on it.  Denies worsening depressive symptoms, suicidal ideation, or panic, though has ongoing anxiety Past Medical History  Diagnosis Date  . HYPERLIPIDEMIA 09/26/2008  . ANXIETY 09/26/2008  . DEPRESSION 09/26/2008  . ADD 10/22/2009  . OTITIS MEDIA, LEFT 01/14/2009  . TONSILLITIS, ACUTE 01/14/2009  . URI 10/22/2009  . ALLERGIC RHINITIS 09/26/2008  . CONSTIPATION 09/26/2008  . SKIN LESION 09/26/2008  . FATIGUE 01/14/2009   Past Surgical History  Procedure Date  . Extraction of wisdom teeth     error in charting  . Polyp removal      reports that she has been smoking Cigarettes.  She has never used smokeless tobacco. She reports that she drinks alcohol. She reports that she does not use illicit drugs. family history includes Diabetes in her other; Hypertension in her mother and other; and Thyroid disease in her mother. No Known Allergies Current Outpatient Prescriptions on File Prior to Visit  Medication Sig Dispense Refill  . clonazePAM (KLONOPIN) 0.5 MG tablet Take 1 tablet (0.5 mg total) by mouth daily as needed. For anxiety  30 tablet  2  . escitalopram (LEXAPRO)  10 MG tablet TAKE 1 TABLET BY MOUTH EVERY DAY  90 tablet  2  . clonazePAM (KLONOPIN) 0.5 MG tablet Take 1 tablet (0.5 mg total) by mouth daily as needed for anxiety.  30 tablet  2    Review of Systems  Constitutional: Negative for diaphoresis and unexpected weight change.  HENT: Negative for tinnitus.   Eyes: Negative for photophobia and visual disturbance.  Respiratory: Negative for choking and stridor.   Gastrointestinal: Negative for vomiting and blood in stool.  Genitourinary: Negative for hematuria and decreased urine volume.  Musculoskeletal: Negative for gait problem.  Skin: Negative for color change and wound.  Neurological: Negative for tremors and numbness.  Psychiatric/Behavioral: Negative for decreased concentration. The patient is not hyperactive.       Objective:   Physical Exam BP 120/70  Pulse 79  Temp 97 F (36.1 C) (Oral)  Ht 5\' 9"  (1.753 m)  Wt 160 lb 2 oz (72.632 kg)  BMI 23.65 kg/m2  SpO2 99%  Physical Exam  VS noted, mild ill Constitutional: Pt appears well-developed and well-nourished.  HENT: Head: Normocephalic.  Right Ear: External ear normal.  Left Ear: External ear normal.  Bilat tm's mild erythema.  Sinus tender.  Pharynx mild erythema Eyes: Conjunctivae and EOM are normal. Pupils are equal, round, and reactive to light.  Neck: Normal range of motion. Neck supple.  Cardiovascular: Normal rate and regular rhythm.   Pulmonary/Chest: Effort normal and breath sounds normal.  Neurological: Pt  is alert. Not confused  Skin: Skin is warm. No erythema.  Psychiatric: Pt behavior is normal. Thought content normal. 1+ nervous, not depressed affect    Assessment & Plan:

## 2011-11-23 NOTE — Assessment & Plan Note (Signed)
stable overall by hx and exam, and pt to continue medical treatment as before, cont's to see counseling as well

## 2011-11-23 NOTE — Patient Instructions (Addendum)
Take all new medications as prescribed Continue all other medications as before Please re-start the Adderall with the same dosing as before Please return in 1 year for your yearly visit, or sooner if needed

## 2011-11-23 NOTE — Assessment & Plan Note (Signed)
Mild to mod, for antibx course,  to f/u any worsening symptoms or concerns 

## 2011-11-23 NOTE — Assessment & Plan Note (Signed)
Needs re-start med,  to f/u any worsening symptoms or concerns

## 2011-12-28 ENCOUNTER — Other Ambulatory Visit: Payer: Self-pay

## 2011-12-28 MED ORDER — CLONAZEPAM 0.5 MG PO TABS: 0.5000 mg | ORAL_TABLET | Freq: Every day | ORAL | Status: DC | PRN

## 2011-12-28 NOTE — Telephone Encounter (Signed)
Faxed hard copy to the pharmacy.

## 2011-12-28 NOTE — Telephone Encounter (Signed)
Done hardcopy to robin  

## 2012-01-11 ENCOUNTER — Other Ambulatory Visit: Payer: Self-pay | Admitting: Obstetrics and Gynecology

## 2012-02-08 ENCOUNTER — Telehealth: Payer: Self-pay | Admitting: Internal Medicine

## 2012-02-08 MED ORDER — AMPHETAMINE-DEXTROAMPHET ER 20 MG PO CP24: 20.0000 mg | ORAL_CAPSULE | ORAL | Status: DC

## 2012-02-08 NOTE — Telephone Encounter (Signed)
Done hardcopy to robin  

## 2012-02-08 NOTE — Telephone Encounter (Signed)
Requesting a refill on Adderall [facility] on [facility]

## 2012-02-08 NOTE — Telephone Encounter (Signed)
Called the patient informed to pickup hardcopy at the front desk at our office.

## 2012-02-28 ENCOUNTER — Emergency Department (HOSPITAL_COMMUNITY)
Admission: EM | Admit: 2012-02-28 | Discharge: 2012-02-28 | Disposition: A | Discharge status: Home / Self Care | Payer: Managed Care, Other (non HMO) | Attending: Emergency Medicine | Admitting: Emergency Medicine

## 2012-02-28 ENCOUNTER — Emergency Department (HOSPITAL_COMMUNITY): Payer: Managed Care, Other (non HMO)

## 2012-02-28 ENCOUNTER — Encounter (HOSPITAL_COMMUNITY): Payer: Self-pay

## 2012-02-28 DIAGNOSIS — Z8669 Personal history of other diseases of the nervous system and sense organs: Secondary | ICD-10-CM | POA: Insufficient documentation

## 2012-02-28 DIAGNOSIS — E785 Hyperlipidemia, unspecified: Secondary | ICD-10-CM | POA: Insufficient documentation

## 2012-02-28 DIAGNOSIS — F411 Generalized anxiety disorder: Secondary | ICD-10-CM | POA: Insufficient documentation

## 2012-02-28 DIAGNOSIS — Z8709 Personal history of other diseases of the respiratory system: Secondary | ICD-10-CM | POA: Insufficient documentation

## 2012-02-28 DIAGNOSIS — F3289 Other specified depressive episodes: Secondary | ICD-10-CM | POA: Insufficient documentation

## 2012-02-28 DIAGNOSIS — F17200 Nicotine dependence, unspecified, uncomplicated: Secondary | ICD-10-CM | POA: Insufficient documentation

## 2012-02-28 DIAGNOSIS — Z872 Personal history of diseases of the skin and subcutaneous tissue: Secondary | ICD-10-CM | POA: Insufficient documentation

## 2012-02-28 DIAGNOSIS — Y9389 Activity, other specified: Secondary | ICD-10-CM | POA: Insufficient documentation

## 2012-02-28 DIAGNOSIS — F329 Major depressive disorder, single episode, unspecified: Secondary | ICD-10-CM | POA: Insufficient documentation

## 2012-02-28 DIAGNOSIS — Y92410 Unspecified street and highway as the place of occurrence of the external cause: Secondary | ICD-10-CM | POA: Insufficient documentation

## 2012-02-28 DIAGNOSIS — S0990XA Unspecified injury of head, initial encounter: Secondary | ICD-10-CM | POA: Insufficient documentation

## 2012-02-28 DIAGNOSIS — S0993XA Unspecified injury of face, initial encounter: Secondary | ICD-10-CM | POA: Insufficient documentation

## 2012-02-28 DIAGNOSIS — Z8719 Personal history of other diseases of the digestive system: Secondary | ICD-10-CM | POA: Insufficient documentation

## 2012-02-28 DIAGNOSIS — Z79899 Other long term (current) drug therapy: Secondary | ICD-10-CM | POA: Insufficient documentation

## 2012-02-28 DIAGNOSIS — IMO0002 Reserved for concepts with insufficient information to code with codable children: Secondary | ICD-10-CM | POA: Insufficient documentation

## 2012-02-28 DIAGNOSIS — F988 Other specified behavioral and emotional disorders with onset usually occurring in childhood and adolescence: Secondary | ICD-10-CM | POA: Insufficient documentation

## 2012-02-28 DIAGNOSIS — Z3202 Encounter for pregnancy test, result negative: Secondary | ICD-10-CM | POA: Insufficient documentation

## 2012-02-28 DIAGNOSIS — S199XXA Unspecified injury of neck, initial encounter: Secondary | ICD-10-CM | POA: Insufficient documentation

## 2012-02-28 LAB — URINALYSIS, ROUTINE W REFLEX MICROSCOPIC
Bilirubin Urine: NEGATIVE
Hgb urine dipstick: NEGATIVE
Nitrite: NEGATIVE
Protein, ur: NEGATIVE mg/dL
Urobilinogen, UA: 0.2 mg/dL (ref 0.0–1.0)

## 2012-02-28 LAB — RAPID URINE DRUG SCREEN, HOSP PERFORMED
Amphetamines: NOT DETECTED
Barbiturates: NOT DETECTED
Benzodiazepines: NOT DETECTED

## 2012-02-28 LAB — POCT I-STAT, CHEM 8
Hemoglobin: 13.9 g/dL (ref 12.0–15.0)
Sodium: 144 mEq/L (ref 135–145)
TCO2: 23 mmol/L (ref 0–100)

## 2012-02-28 LAB — GLUCOSE, CAPILLARY: Glucose-Capillary: 94 mg/dL (ref 70–99)

## 2012-02-28 LAB — PREGNANCY, URINE: Preg Test, Ur: NEGATIVE

## 2012-02-28 MED ORDER — METHOCARBAMOL 500 MG PO TABS: 500.0000 mg | ORAL_TABLET | Freq: Two times a day (BID) | ORAL | Status: DC

## 2012-02-28 MED ORDER — IBUPROFEN 600 MG PO TABS: 600.0000 mg | ORAL_TABLET | Freq: Four times a day (QID) | ORAL | Status: DC | PRN

## 2012-02-28 MED ORDER — IBUPROFEN 800 MG PO TABS
800.0000 mg | ORAL_TABLET | Freq: Once | ORAL | Status: AC
  Administered 2012-02-28: 800 mg via ORAL
  Filled 2012-02-28: qty 1

## 2012-02-28 NOTE — ED Notes (Addendum)
Pt currently unable to provide urine sample at this time.

## 2012-02-28 NOTE — ED Notes (Signed)
Pt. Is aware that we need urine,unable to give [place] an example.  Pt.s clothes removed and pt. Is gowned.

## 2012-02-28 NOTE — ED Notes (Signed)
Pt. Refused In and Out cath.  Pt. Ambulated to the bathroom, gait steady, urine specimen obtained

## 2012-02-28 NOTE — ED Notes (Signed)
Po. Fluids given
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
## 2012-02-28 NOTE — ED Provider Notes (Signed)
Medical screening examination/treatment/procedure(s) were performed by non-physician practitioner and as supervising physician I was immediately available for consultation/collaboration.  Hannia Matchett T Nastasha Reising, MD 02/28/12 1459 
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
## 2012-03-15 ENCOUNTER — Telehealth: Payer: Self-pay | Admitting: Internal Medicine

## 2012-03-15 MED ORDER — AMPHETAMINE-DEXTROAMPHET ER 20 MG PO CP24: 20.0000 mg | ORAL_CAPSULE | ORAL | Status: DC

## 2012-03-15 NOTE — Telephone Encounter (Signed)
Done hardcopy to lucy 

## 2012-03-15 NOTE — Telephone Encounter (Signed)
The pt called hoping to get a refill of her Adderall rx

## 2012-03-15 NOTE — Telephone Encounter (Signed)
Pt notiified

## 2012-04-07 ENCOUNTER — Other Ambulatory Visit: Payer: Self-pay

## 2012-04-07 MED ORDER — CLONAZEPAM 0.5 MG PO TABS: 0.5000 mg | ORAL_TABLET | Freq: Every day | ORAL | Status: DC | PRN

## 2012-04-07 NOTE — Telephone Encounter (Signed)
Done hardcopy to robin  

## 2012-04-07 NOTE — Telephone Encounter (Signed)
Faxed hardcopy to pharmacy. 

## 2012-06-29 ENCOUNTER — Other Ambulatory Visit: Payer: Self-pay

## 2012-06-29 MED ORDER — CLONAZEPAM 0.5 MG PO TABS: 0.5000 mg | ORAL_TABLET | Freq: Every day | ORAL | Status: DC | PRN

## 2012-06-29 MED ORDER — AMPHETAMINE-DEXTROAMPHET ER 20 MG PO CP24: 20.0000 mg | ORAL_CAPSULE | ORAL | Status: DC

## 2012-06-29 MED ORDER — ESCITALOPRAM OXALATE 10 MG PO TABS: ORAL_TABLET | ORAL | Status: DC

## 2012-06-29 NOTE — Telephone Encounter (Signed)
Done hardcopy to robin  

## 2012-06-29 NOTE — Telephone Encounter (Signed)
Called the patient left detailed message that prescriptions requested are ready for pickup at the front desk.

## 2012-08-17 ENCOUNTER — Ambulatory Visit: Payer: Managed Care, Other (non HMO) | Admitting: Internal Medicine

## 2012-10-24 ENCOUNTER — Other Ambulatory Visit: Payer: Self-pay | Admitting: Internal Medicine

## 2012-11-02 ENCOUNTER — Encounter: Payer: Self-pay | Admitting: Internal Medicine

## 2012-11-02 ENCOUNTER — Ambulatory Visit (INDEPENDENT_AMBULATORY_CARE_PROVIDER_SITE_OTHER): Payer: Managed Care, Other (non HMO) | Admitting: Internal Medicine

## 2012-11-02 VITALS — BP 102/70 | HR 80 | Temp 98.6°F | Ht 69.0 in | Wt 169.4 lb

## 2012-11-02 DIAGNOSIS — J019 Acute sinusitis, unspecified: Secondary | ICD-10-CM

## 2012-11-02 MED ORDER — AZITHROMYCIN 250 MG PO TABS: ORAL_TABLET | ORAL | Status: DC

## 2012-11-02 MED ORDER — HYDROCODONE-HOMATROPINE 5-1.5 MG/5ML PO SYRP: 5.0000 mL | ORAL_SOLUTION | Freq: Four times a day (QID) | ORAL | Status: DC | PRN

## 2012-11-02 NOTE — Patient Instructions (Signed)
Please take all new medication as prescribed Please continue all other medications as before, and refills have been done if requested.  Please remember to sign up for My Chart if you have not done so, as this will be important to you in the future with finding out test results, communicating by private email, and scheduling acute appointments online when needed.   

## 2012-11-02 NOTE — Assessment & Plan Note (Signed)
Mild to mod, for antibx course,  to f/u any worsening symptoms or concerns 

## 2012-11-02 NOTE — Progress Notes (Signed)
  Subjective:    Patient ID: [name], female    DOB: [birth year], 32 y.o.   MRN: [id number]  HPI  Here with 2-3 days acute onset fever, facial pain, pressure, headache, general weakness and malaise, and greenish d/c, with mild ST and cough, but pt denies chest pain, wheezing, increased sob or doe, orthopnea, PND, increased LE swelling, palpitations, dizziness or syncope. Past Medical History  Diagnosis Date  . HYPERLIPIDEMIA 09/26/2008  . ANXIETY 09/26/2008  . DEPRESSION 09/26/2008  . ADD 10/22/2009  . OTITIS MEDIA, LEFT 01/14/2009  . TONSILLITIS, ACUTE 01/14/2009  . URI 10/22/2009  . ALLERGIC RHINITIS 09/26/2008  . CONSTIPATION 09/26/2008  . SKIN LESION 09/26/2008  . FATIGUE 01/14/2009   Past Surgical History  Procedure Laterality Date  . Extraction of wisdom teeth      error in charting  . Polyp removal       reports that she has been smoking Cigarettes.  She has been smoking about 0.00 packs per day. She has never used smokeless tobacco. She reports that she drinks alcohol. She reports that she does not use illicit drugs. family history includes Diabetes in her other; Hypertension in her mother and other; Thyroid disease in her mother. No Known Allergies Current Outpatient Prescriptions on File Prior to Visit  Medication Sig Dispense Refill  . amphetamine-dextroamphetamine (ADDERALL XR) 20 MG 24 hr capsule Take 1 capsule (20 mg total) by mouth every morning.  30 capsule  0  . clonazePAM (KLONOPIN) 0.5 MG tablet Take 1 tablet (0.5 mg total) by mouth daily as needed. To fill July 05, 2012, For anxiety  30 tablet  2  . escitalopram (LEXAPRO) 10 MG tablet TAKE 1 TABLET BY MOUTH EVERY DAY  90 tablet  0  . ibuprofen (ADVIL,MOTRIN) 600 MG tablet Take 1 tablet (600 mg total) by mouth every 6 (six) hours as needed for pain.  30 tablet  0  . methocarbamol (ROBAXIN) 500 MG tablet Take 1 tablet (500 mg total) by mouth 2 (two) times daily.  20 tablet  0   No current facility-administered medications  on file prior to visit.   Review of Systems All otherwise neg per pt     Objective:   Physical Exam BP 102/70  Pulse 80  Temp(Src) 98.6 F (37 C) (Oral)  Ht 5\' 9"  (1.753 m)  Wt 169 lb 6 oz (76.828 kg)  BMI 25 kg/m2  SpO2 97% VS noted,  Constitutional: Pt appears well-developed and well-nourished.  HENT: Head: NCAT.  Right Ear: External ear normal.  Left Ear: External ear normal.  Eyes: Conjunctivae and EOM are normal. Pupils are equal, round, and reactive to light.  Bilat tm's with mild erythema.  Max sinus areas mild tender.  Pharynx with mild erythema, no exudate Neck: Normal range of motion. Neck supple.  Cardiovascular: Normal rate and regular rhythm.   Pulmonary/Chest: Effort normal and breath sounds normal.  Neurological: Pt is alert. Not confused  Skin: Skin is warm. No erythema.  Psychiatric: Pt behavior is normal. Thought content normal.     Assessment & Plan:

## 2013-01-01 ENCOUNTER — Other Ambulatory Visit: Payer: Self-pay | Admitting: Internal Medicine

## 2013-01-02 ENCOUNTER — Ambulatory Visit: Payer: Managed Care, Other (non HMO) | Admitting: Nurse Practitioner

## 2013-01-16 ENCOUNTER — Other Ambulatory Visit: Payer: Self-pay | Admitting: Obstetrics and Gynecology

## 2013-05-17 ENCOUNTER — Ambulatory Visit: Payer: Managed Care, Other (non HMO) | Admitting: Physician Assistant

## 2013-05-17 ENCOUNTER — Ambulatory Visit: Payer: Managed Care, Other (non HMO) | Admitting: Family

## 2013-05-18 ENCOUNTER — Ambulatory Visit (INDEPENDENT_AMBULATORY_CARE_PROVIDER_SITE_OTHER): Payer: Managed Care, Other (non HMO) | Admitting: Internal Medicine

## 2013-05-18 ENCOUNTER — Encounter: Payer: Self-pay | Admitting: *Deleted

## 2013-05-18 ENCOUNTER — Encounter: Payer: Self-pay | Admitting: Internal Medicine

## 2013-05-18 VITALS — BP 102/74 | HR 80 | Temp 98.7°F | Ht 69.0 in | Wt 161.0 lb

## 2013-05-18 DIAGNOSIS — F988 Other specified behavioral and emotional disorders with onset usually occurring in childhood and adolescence: Secondary | ICD-10-CM

## 2013-05-18 DIAGNOSIS — J329 Chronic sinusitis, unspecified: Secondary | ICD-10-CM | POA: Insufficient documentation

## 2013-05-18 DIAGNOSIS — J309 Allergic rhinitis, unspecified: Secondary | ICD-10-CM

## 2013-05-18 MED ORDER — METHYLPREDNISOLONE ACETATE 80 MG/ML IJ SUSP
80.0000 mg | Freq: Once | INTRAMUSCULAR | Status: AC
  Administered 2013-05-18: 80 mg via INTRAMUSCULAR

## 2013-05-18 MED ORDER — FLUTICASONE PROPIONATE 50 MCG/ACT NA SUSP: 2.0000 | Freq: Every day | NASAL | Status: DC

## 2013-05-18 MED ORDER — PREDNISONE 10 MG PO TABS: ORAL_TABLET | ORAL | Status: DC

## 2013-05-18 MED ORDER — AZITHROMYCIN 250 MG PO TABS: ORAL_TABLET | ORAL | Status: DC

## 2013-05-18 MED ORDER — FEXOFENADINE HCL 180 MG PO TABS: 180.0000 mg | ORAL_TABLET | Freq: Every day | ORAL | Status: DC

## 2013-05-18 NOTE — Patient Instructions (Signed)
You had the steroid shot today  Please take all new medication as prescribed - the prednisone, allegra, flonase and antibiotic as discussed  Please continue all other medications as before, and refills have been done if requested. Please have the pharmacy call with any other refills you may need.

## 2013-05-18 NOTE — Progress Notes (Signed)
Pre visit review using our clinic review tool, if applicable. No additional management support is needed unless otherwise documented below in the visit note. 

## 2013-05-19 ENCOUNTER — Telehealth: Payer: Self-pay | Admitting: Internal Medicine

## 2013-05-19 NOTE — Telephone Encounter (Signed)
Relevant patient education mailed to patient.  

## 2013-05-20 NOTE — Assessment & Plan Note (Signed)
Mild to mod, for depomedrol IM, predpack asd,  to f/u any worsening symptoms or concerns 

## 2013-05-20 NOTE — Assessment & Plan Note (Signed)
Mild to mod, for antibx course,  to f/u any worsening symptoms or concerns 

## 2013-05-20 NOTE — Assessment & Plan Note (Signed)
stable overall by history and exam, and pt to continue medical treatment as before,  to f/u any worsening symptoms or concerns 

## 2013-05-20 NOTE — Progress Notes (Signed)
Subjective:    Patient ID: [name], female    DOB: 04/06/1980, 33 y.o.   MRN: [id number]  HPI  Here to f/u with 2-3 wks onset allergy symptoms - Does have several wks ongoing nasal allergy symptoms with clearish congestion, itch and sneezing, without fever, pain, ST, cough, swelling or wheezing.  Ithcing all over making her very nervous, wonders several times about scabies after a friend suggested this.  Has intermittent HA, recurring red skin areas, general weakness, and increased stress, anxiety.  Also  Here with 2-3 days acute onset fever, facial pain, pressure, headache, general weakness and malaise.  Denies worsening depressive symptoms, suicidal ideation, or panic Past Medical History  Diagnosis Date  . HYPERLIPIDEMIA 09/26/2008  . ANXIETY 09/26/2008  . DEPRESSION 09/26/2008  . ADD 10/22/2009  . OTITIS MEDIA, LEFT 01/14/2009  . TONSILLITIS, ACUTE 01/14/2009  . URI 10/22/2009  . ALLERGIC RHINITIS 09/26/2008  . CONSTIPATION 09/26/2008  . SKIN LESION 09/26/2008  . FATIGUE 01/14/2009   Past Surgical History  Procedure Laterality Date  . Extraction of wisdom teeth      error in charting  . Polyp removal       reports that she has been smoking Cigarettes.  She has been smoking about 0.00 packs per day. She has never used smokeless tobacco. She reports that she drinks alcohol. She reports that she does not use illicit drugs. family history includes Diabetes in her other; Hypertension in her mother and other; Thyroid disease in her mother. No Known Allergies Current Outpatient Prescriptions on File Prior to Visit  Medication Sig Dispense Refill  . amphetamine-dextroamphetamine (ADDERALL XR) 20 MG 24 hr capsule Take 1 capsule (20 mg total) by mouth every morning.  30 capsule  0  . clonazePAM (KLONOPIN) 0.5 MG tablet Take 1 tablet (0.5 mg total) by mouth daily as needed. To fill July 05, 2012, For anxiety  30 tablet  2  . escitalopram (LEXAPRO) 10 MG tablet TAKE 1 TABLET BY MOUTH EVERY DAY  90  tablet  3  . ibuprofen (ADVIL,MOTRIN) 600 MG tablet Take 1 tablet (600 mg total) by mouth every 6 (six) hours as needed for pain.  30 tablet  0  . methocarbamol (ROBAXIN) 500 MG tablet Take 1 tablet (500 mg total) by mouth 2 (two) times daily.  20 tablet  0   No current facility-administered medications on file prior to visit.    Review of Systems  Constitutional: Negative for unusual diaphoresis or other sweats  HENT: Negative for ringing in ear Eyes: Negative for double vision or worsening visual disturbance.  Respiratory: Negative for choking and stridor.   Gastrointestinal: Negative for vomiting or other signifcant bowel change Genitourinary: Negative for hematuria or decreased urine volume.  Musculoskeletal: Negative for other MSK pain or swelling Skin: Negative for color change and worsening wound.  Neurological: Negative for tremors and numbness other than noted  Psychiatric/Behavioral: Negative for decreased concentration or agitation other than above       Objective:   Physical Exam BP 102/74  Pulse 80  Temp(Src) 98.7 F (37.1 C) (Oral)  Ht 5\' 9"  (1.753 m)  Wt 161 lb (73.029 kg)  BMI 23.76 kg/m2 Ms. VS noted,  Constitutional: Pt appears well-developed, well-nourished.  HENT: Head: NCAT.  Right Ear: External ear normal.  Left Ear: External ear normal.  Eyes: . Pupils are equal, round, and reactive to light. Conjunctivae and EOM are normal Bilat tm's with mild erythema.  Max sinus areas mild tender.  Pharynx with mild erythema, no exudate Neck: Normal range of motion. Neck supple.  Cardiovascular: Normal rate and regular rhythm.   Pulmonary/Chest: Effort normal and breath sounds normal.  Neurological: Pt is alert. Not confused , motor grossly intact Skin: Skin is warm. Several areas ill defined erythema with blanching, no induration, NT, no lip swelling Psychiatric: Pt behavior is normal. mild agitation.1+ nervous     Assessment & Plan:

## 2013-08-28 ENCOUNTER — Ambulatory Visit: Payer: Self-pay | Admitting: Internal Medicine

## 2013-08-30 ENCOUNTER — Encounter: Payer: Self-pay | Admitting: Internal Medicine

## 2013-08-30 ENCOUNTER — Ambulatory Visit (INDEPENDENT_AMBULATORY_CARE_PROVIDER_SITE_OTHER): Payer: Managed Care, Other (non HMO) | Admitting: Internal Medicine

## 2013-08-30 ENCOUNTER — Other Ambulatory Visit (INDEPENDENT_AMBULATORY_CARE_PROVIDER_SITE_OTHER): Payer: Managed Care, Other (non HMO)

## 2013-08-30 VITALS — BP 118/80 | HR 80 | Temp 97.8°F | Wt 159.4 lb

## 2013-08-30 DIAGNOSIS — R112 Nausea with vomiting, unspecified: Secondary | ICD-10-CM

## 2013-08-30 DIAGNOSIS — R5381 Other malaise: Secondary | ICD-10-CM

## 2013-08-30 DIAGNOSIS — K589 Irritable bowel syndrome without diarrhea: Secondary | ICD-10-CM

## 2013-08-30 DIAGNOSIS — J309 Allergic rhinitis, unspecified: Secondary | ICD-10-CM

## 2013-08-30 DIAGNOSIS — R5383 Other fatigue: Principal | ICD-10-CM

## 2013-08-30 LAB — CBC WITH DIFFERENTIAL/PLATELET
BASOS PCT: 0.4 % (ref 0.0–3.0)
Basophils Absolute: 0 10*3/uL (ref 0.0–0.1)
EOS PCT: 0.3 % (ref 0.0–5.0)
Eosinophils Absolute: 0 10*3/uL (ref 0.0–0.7)
HEMATOCRIT: 44.5 % (ref 36.0–46.0)
Hemoglobin: 14.8 g/dL (ref 12.0–15.0)
Lymphocytes Relative: 17.6 % (ref 12.0–46.0)
Lymphs Abs: 2.1 10*3/uL (ref 0.7–4.0)
MCHC: 33.3 g/dL (ref 30.0–36.0)
MCV: 96.5 fl (ref 78.0–100.0)
MONOS PCT: 5 % (ref 3.0–12.0)
Monocytes Absolute: 0.6 10*3/uL (ref 0.1–1.0)
Neutro Abs: 9.1 10*3/uL — ABNORMAL HIGH (ref 1.4–7.7)
Neutrophils Relative %: 76.7 % (ref 43.0–77.0)
Platelets: 217 10*3/uL (ref 150.0–400.0)
RBC: 4.61 Mil/uL (ref 3.87–5.11)
RDW: 13.2 % (ref 11.5–15.5)
WBC: 11.8 10*3/uL — AB (ref 4.0–10.5)

## 2013-08-30 NOTE — Progress Notes (Signed)
Pre visit review using our clinic review tool, if applicable. No additional management support is needed unless otherwise documented below in the visit note. 
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
## 2013-08-30 NOTE — Patient Instructions (Addendum)
Your next office appointment will be determined based upon review of your pending labs . Those instructions will be transmitted to you  by mail;whichever process is your choice to receive results & recommendations .  Followup as needed for your acute issue. Please report any significant change in your symptoms. Plain Mucinex (NOT D) for thick secretions ;force NON dairy fluids .   Nasal cleansing in the shower as discussed with lather of mild shampoo.After 10 seconds wash off lather while  exhaling through nostrils. Make sure that all residual soap is removed to prevent irritation.  Flonase OR Nasacort AQ 1 spray in each nostril twice a day as needed. Use the "crossover" technique into opposite nostril spraying toward opposite ear @ 45 degree angle, not straight up into nostril.  Use a Neti pot daily only  as needed for significant sinus congestion; going from open side to congested side . Plain Allegra (NOT D )  160 daily , Loratidine 10 mg , OR Zyrtec 10 mg @ bedtime  as needed for itchy eyes & sneezing. Report persistent or progressive fever; discolored nasal or chest secretions; or frontal headache or facial  pain.       Please take a probiotic , Florastor OR Align, every day if  the bowels are loose. This will replace the normal bacteria which  are necessary for formation of normal stool and processing of food.

## 2013-08-31 LAB — HEPATIC FUNCTION PANEL
ALBUMIN: 4.6 g/dL (ref 3.5–5.2)
ALK PHOS: 65 U/L (ref 39–117)
ALT: 16 U/L (ref 0–35)
AST: 18 U/L (ref 0–37)
Bilirubin, Direct: 0.1 mg/dL (ref 0.0–0.3)
TOTAL PROTEIN: 7.1 g/dL (ref 6.0–8.3)
Total Bilirubin: 0.7 mg/dL (ref 0.2–1.2)

## 2013-08-31 LAB — BASIC METABOLIC PANEL
BUN: 9 mg/dL (ref 6–23)
CALCIUM: 9.7 mg/dL (ref 8.4–10.5)
CHLORIDE: 101 meq/L (ref 96–112)
CO2: 28 meq/L (ref 19–32)
Creatinine, Ser: 0.7 mg/dL (ref 0.4–1.2)
GFR: 101.77 mL/min (ref >60.00)
GLUCOSE: 91 mg/dL (ref 70–99)
Potassium: 4.8 mEq/L (ref 3.5–5.1)
SODIUM: 136 meq/L (ref 135–145)

## 2013-08-31 LAB — TSH: TSH: 1.65 u[IU]/mL (ref 0.35–4.50)

## 2013-09-13 ENCOUNTER — Other Ambulatory Visit: Payer: Self-pay | Admitting: Internal Medicine

## 2013-09-17 ENCOUNTER — Other Ambulatory Visit: Payer: Self-pay | Admitting: Internal Medicine

## 2013-09-19 ENCOUNTER — Telehealth: Payer: Self-pay

## 2013-09-19 NOTE — Telephone Encounter (Signed)
Done hardcopy to robin  

## 2013-09-19 NOTE — Telephone Encounter (Signed)
Refill done.  

## 2013-09-19 NOTE — Telephone Encounter (Signed)
Faxed hardcopy to [facility] [address] [place] 

## 2013-11-10 ENCOUNTER — Emergency Department (HOSPITAL_COMMUNITY): Payer: Managed Care, Other (non HMO)

## 2013-11-10 ENCOUNTER — Encounter (HOSPITAL_COMMUNITY): Payer: Self-pay | Admitting: Emergency Medicine

## 2013-11-10 ENCOUNTER — Emergency Department (HOSPITAL_COMMUNITY)
Admission: EM | Admit: 2013-11-10 | Discharge: 2013-11-11 | Disposition: A | Discharge status: Home / Self Care | Payer: Managed Care, Other (non HMO) | Attending: Emergency Medicine | Admitting: Emergency Medicine

## 2013-11-10 DIAGNOSIS — F329 Major depressive disorder, single episode, unspecified: Secondary | ICD-10-CM | POA: Diagnosis not present

## 2013-11-10 DIAGNOSIS — R0789 Other chest pain: Secondary | ICD-10-CM | POA: Diagnosis not present

## 2013-11-10 DIAGNOSIS — R05 Cough: Secondary | ICD-10-CM | POA: Diagnosis not present

## 2013-11-10 DIAGNOSIS — F419 Anxiety disorder, unspecified: Secondary | ICD-10-CM | POA: Insufficient documentation

## 2013-11-10 DIAGNOSIS — Z720 Tobacco use: Secondary | ICD-10-CM | POA: Insufficient documentation

## 2013-11-10 DIAGNOSIS — Z872 Personal history of diseases of the skin and subcutaneous tissue: Secondary | ICD-10-CM | POA: Diagnosis not present

## 2013-11-10 DIAGNOSIS — F909 Attention-deficit hyperactivity disorder, unspecified type: Secondary | ICD-10-CM | POA: Diagnosis not present

## 2013-11-10 DIAGNOSIS — Z79899 Other long term (current) drug therapy: Secondary | ICD-10-CM | POA: Insufficient documentation

## 2013-11-10 DIAGNOSIS — Z8709 Personal history of other diseases of the respiratory system: Secondary | ICD-10-CM | POA: Insufficient documentation

## 2013-11-10 DIAGNOSIS — Z8719 Personal history of other diseases of the digestive system: Secondary | ICD-10-CM | POA: Diagnosis not present

## 2013-11-10 DIAGNOSIS — Z8669 Personal history of other diseases of the nervous system and sense organs: Secondary | ICD-10-CM | POA: Diagnosis not present

## 2013-11-10 DIAGNOSIS — R079 Chest pain, unspecified: Secondary | ICD-10-CM | POA: Diagnosis present

## 2013-11-10 DIAGNOSIS — Z8639 Personal history of other endocrine, nutritional and metabolic disease: Secondary | ICD-10-CM | POA: Insufficient documentation

## 2013-11-10 LAB — CBC WITH DIFFERENTIAL/PLATELET
BASOS ABS: 0 10*3/uL (ref 0.0–0.1)
BASOS PCT: 0 % (ref 0–1)
EOS PCT: 2 % (ref 0–5)
Eosinophils Absolute: 0.1 10*3/uL (ref 0.0–0.7)
HEMATOCRIT: 40.6 % (ref 36.0–46.0)
HEMOGLOBIN: 13.6 g/dL (ref 12.0–15.0)
Lymphocytes Relative: 34 % (ref 12–46)
Lymphs Abs: 3 10*3/uL (ref 0.7–4.0)
MCH: 32 pg (ref 26.0–34.0)
MCHC: 33.5 g/dL (ref 30.0–36.0)
MCV: 95.5 fL (ref 78.0–100.0)
MONO ABS: 0.5 10*3/uL (ref 0.1–1.0)
MONOS PCT: 6 % (ref 3–12)
Neutro Abs: 5.3 10*3/uL (ref 1.7–7.7)
Neutrophils Relative %: 58 % (ref 43–77)
Platelets: 191 10*3/uL (ref 150–400)
RBC: 4.25 MIL/uL (ref 3.87–5.11)
RDW: 12 % (ref 11.5–15.5)
WBC: 9.1 10*3/uL (ref 4.0–10.5)

## 2013-11-10 LAB — I-STAT BETA HCG BLOOD, ED (MC, WL, AP ONLY): I-stat hCG, quantitative: <5 m[IU]/mL (ref <5)

## 2013-11-10 LAB — BASIC METABOLIC PANEL
ANION GAP: 13 (ref 5–15)
BUN: 10 mg/dL (ref 6–23)
CALCIUM: 9.1 mg/dL (ref 8.4–10.5)
CO2: 22 mEq/L (ref 19–32)
Chloride: 103 mEq/L (ref 96–112)
Creatinine, Ser: 0.65 mg/dL (ref 0.50–1.10)
GFR calc non Af Amer: >90 mL/min (ref >90)
Glucose, Bld: 91 mg/dL (ref 70–99)
Potassium: 3.9 mEq/L (ref 3.7–5.3)
Sodium: 138 mEq/L (ref 137–147)

## 2013-11-10 LAB — I-STAT TROPONIN, ED: TROPONIN I, POC: 0 ng/mL (ref 0.00–0.08)

## 2013-11-10 MED ORDER — ASPIRIN 81 MG PO CHEW
324.0000 mg | CHEWABLE_TABLET | Freq: Once | ORAL | Status: AC
  Administered 2013-11-10: 324 mg via ORAL
  Filled 2013-11-10: qty 4

## 2013-11-10 NOTE — ED Notes (Signed)
Patient transported to X-ray 

## 2013-11-10 NOTE — ED Notes (Signed)
Pt presents w/ c/o CP.  Pain is in left chest w/ radiation to left arm.  Pt describes chest pain as a pressure 5/10.  Pt has had CP like this in the past w/ no cardiac origin.  Pt endorses SOB however is able to speak in full sentences.  Pt does have hx of anxiety.

## 2013-11-10 NOTE — ED Notes (Signed)
Bed: WTR5 Expected date:  Expected time:  Means of arrival:  Comments: 

## 2013-11-10 NOTE — ED Provider Notes (Signed)
CSN: [id number]     Arrival date & time 11/10/13  2217 History  This chart was scribed for non-physician practitioner, Wynetta EmeryNicole Leshawn Houseworth, PA-C working with Lahoma CrockerKelvin Campos, MD by Luisa DagoPriscilla Tutu, ED scribe. This patient was seen in room WA14/WA14 and the patient's care was started at 10:56 PM.      Chief Complaint  Patient presents with  . Chest Pain     The history is provided by the patient. No language interpreter was used.   HPI Comments: [name] is a 33 y.o. female with a hx of anxiety presents to the Emergency Department complaining of acute onset chest pain that started today just prior to arrival. Pt describes her current pain as a "pulling" pain and radiating down bilateral arms. Ms. Lovie MacadamiaKenney states that the pain is relieved by applying pressure to the affected areas. She also endorses associated leg swelling, leg pain, and a dry cough. Pt states that she is unsure of this current episode being a panic attack. She reports taking half a Klonopin at home which gave her mild relief of her symptoms. Pt rates her current pain as a "3/10". Pain is non-pleuritical and non-exertional. Denies taking any birth control, recent travels, or hx of unexpected family death at the age of 89. Pt smokes daily.  Past Medical History  Diagnosis Date  . HYPERLIPIDEMIA 09/26/2008  . ANXIETY 09/26/2008  . DEPRESSION 09/26/2008  . ADD 10/22/2009  . OTITIS MEDIA, LEFT 01/14/2009  . TONSILLITIS, ACUTE 01/14/2009  . URI 10/22/2009  . ALLERGIC RHINITIS 09/26/2008  . CONSTIPATION 09/26/2008  . SKIN LESION 09/26/2008  . FATIGUE 01/14/2009   Past Surgical History  Procedure Laterality Date  . Extraction of wisdom teeth      error in charting  . Polyp removal      Family History  Problem Relation Age of Onset  . Diabetes Other   . Hypertension Other   . Thyroid disease Mother   . Hypertension Mother    History  Substance Use Topics  . Smoking status: Current Every Day Smoker    Types: Cigarettes  .  Smokeless tobacco: Never Used  . Alcohol Use: Yes   OB History   Grav Para Term Preterm Abortions TAB SAB Ect Mult Living                 Review of Systems  Constitutional: Negative for chills and appetite change.  HENT: Negative for mouth sores and sore throat.   Eyes: Negative for visual disturbance.  Respiratory: Positive for cough. Negative for chest tightness and wheezing.   Cardiovascular: Positive for chest pain and leg swelling.  Gastrointestinal: Negative for diarrhea and abdominal distention.  Endocrine: Negative for polydipsia, polyphagia and polyuria.  Genitourinary: Negative for dysuria, frequency and hematuria.  Musculoskeletal: Positive for arthralgias. Negative for gait problem.  Skin: Negative for color change, pallor and rash.  Neurological: Negative for syncope and light-headedness.  Hematological: Does not bruise/bleed easily.  Psychiatric/Behavioral: Negative for behavioral problems and confusion. The patient is nervous/anxious.    Allergies  Review of patient's allergies indicates no known allergies.  Home Medications   Prior to Admission medications   Medication Sig Start Date End Date Taking? Authorizing Provider  clonazePAM (KLONOPIN) 0.5 MG tablet TAKE 1 TABLET BY MOUTH EVERY DAY AS NEEDED FOR ANXIETY 09/19/13  Yes Corwin LevinsJames W John, MD  escitalopram (LEXAPRO) 10 MG tablet TAKE 1 TABLET BY MOUTH EVERY DAY 09/13/13  Yes Corwin LevinsJames W John, MD  amphetamine-dextroamphetamine (ADDERALL XR) 20  MG 24 hr capsule Take 1 capsule (20 mg total) by mouth every morning. 06/29/12   Corwin LevinsJames W John, MD  clonazePAM (KLONOPIN) 0.5 MG tablet Take 1 tablet (0.5 mg total) by mouth 3 (three) times daily as needed for anxiety. 11/11/13   Maxtyn Nuzum, PA-C   Triage Vitals:BP 149/69  Pulse 92  Temp(Src) 98 F (36.7 C) (Oral)  Resp 18  SpO2 98%  LMP 11/09/2013  Physical Exam  Nursing note and vitals reviewed. Constitutional: She is oriented to person, place, and time. She appears  well-developed and well-nourished. No distress.  HENT:  Head: Normocephalic and atraumatic.  Mouth/Throat: Oropharynx is clear and moist.  Eyes: Conjunctivae and EOM are normal. Pupils are equal, round, and reactive to light.  Neck: Normal range of motion. Neck supple. No tracheal deviation present.  Cardiovascular: Normal rate and intact distal pulses.   Pulmonary/Chest: Effort normal and breath sounds normal. No respiratory distress. She has no wheezes. She has no rales. She exhibits no tenderness.  Abdominal: Soft. She exhibits no distension and no mass. There is no tenderness. There is no rebound and no guarding.  Musculoskeletal: Normal range of motion. She exhibits no edema and no tenderness.  No calf asymmetry, superficial collaterals, palpable cords, edema, Homans sign negative bilaterally.    Neurological: She is alert and oriented to person, place, and time.  Skin: Skin is warm and dry.  Psychiatric: She has a normal mood and affect. Her behavior is normal.    ED Course  Procedures (including critical care time)  DIAGNOSTIC STUDIES: Oxygen Saturation is 98% on RA, normal by my interpretation.    COORDINATION OF CARE: 11:05 PM- Pt advised of plan for treatment and pt agrees.  Labs Review Labs Reviewed  CBC WITH DIFFERENTIAL  BASIC METABOLIC PANEL  D-DIMER, QUANTITATIVE  I-STAT TROPOININ, ED  I-STAT BETA HCG BLOOD, ED ([facility], [facility], [facility] ONLY)    Imaging Review Dg Chest 2 View  11/11/2013   CLINICAL DATA:  33 year old female with left-sided chest pain extending into left arm. Hyperlipidemia. Initial encounter.  EXAM: CHEST  2 VIEW  COMPARISON:  Thoracic spine plain film exam 02/28/2012.  FINDINGS: No infiltrate, congestive heart failure or pneumothorax.  Heart size within normal limits.  Minimal curvature thoracic spine.  IMPRESSION: No acute abnormality.  Please see above.   Electronically Signed   By: Bridgett LarssonSteve  Olson M.D.   On: 11/11/2013 00:00     EKG  Interpretation   Date/Time:  Friday November 10 2013 22:22:20 EDT Ventricular Rate:  100 PR Interval:  117 QRS Duration: 81 QT Interval:  347 QTC Calculation: 447 R Axis:   75 Text Interpretation:  Sinus tachycardia Biatrial enlargement Baseline  wander in lead(s) I II aVR V6 No old tracing to compare Confirmed by  CAMPOS  MD, KEVIN ([id number]) on 11/11/2013 12:55:48 AM      MDM   Final diagnoses:  Atypical chest pain    Filed Vitals:   11/10/13 2223  BP: 149/69  Pulse: 92  Temp: 98 F (36.7 C)  TempSrc: Oral  Resp: 18  SpO2: 98%    Medications  aspirin chewable tablet 324 mg (324 mg Oral Given 11/10/13 2351)  LORazepam (ATIVAN) injection 1 mg (1 mg Intravenous Given 11/11/13 0100)    Janet Settingmanda J Amores is a 33 y.o. female presenting with left-sided chest pain with radiation to the left arm. EKG is nonischemic, troponin is negative. Her heart score is low risk. D-dimer is negative and chest x-ray without acute  abnormality. Patient's pain is relieved by Klonopin, and this may be related to anxiety. Patient is given a short course of Klonopin until she can follow with her primary care. Explained to her that this is not typical of a script we would write for in the emergency room at making an exception in this case.  Evaluation does not show pathology that would require ongoing emergent intervention or inpatient treatment. Pt is hemodynamically stable and mentating appropriately. Discussed findings and plan with patient/guardian, who agrees with care plan. All questions answered. Return precautions discussed and outpatient follow up given.   Discharge Medication List as of 11/11/2013 12:35 AM    START taking these medications   Details  !! clonazePAM (KLONOPIN) 0.5 MG tablet Take 1 tablet (0.5 mg total) by mouth 3 (three) times daily as needed for anxiety., Starting 11/11/2013, Until Discontinued, Print     !! - Potential duplicate medications found. Please discuss with provider.         I personally performed the services described in this documentation, which was scribed in my presence. The recorded information has been reviewed and is accurate.    Wynetta Emery, PA-C 11/11/13 [phone]

## 2013-11-11 LAB — D-DIMER, QUANTITATIVE (NOT AT ARMC)

## 2013-11-11 MED ORDER — LORAZEPAM 2 MG/ML IJ SOLN
1.0000 mg | Freq: Once | INTRAMUSCULAR | Status: AC
  Administered 2013-11-11: 1 mg via INTRAVENOUS
  Filled 2013-11-11: qty 1

## 2013-11-11 MED ORDER — CLONAZEPAM 0.5 MG PO TABS: 0.5000 mg | ORAL_TABLET | Freq: Three times a day (TID) | ORAL | Status: DC | PRN

## 2013-11-11 NOTE — ED Provider Notes (Signed)
Medical screening examination/treatment/procedure(s) were performed by non-physician practitioner and as supervising physician I was immediately available for consultation/collaboration.   EKG Interpretation   Date/Time:  Friday November 10 2013 22:22:20 EDT Ventricular Rate:  100 PR Interval:  117 QRS Duration: 81 QT Interval:  347 QTC Calculation: 447 R Axis:   75 Text Interpretation:  Sinus tachycardia Biatrial enlargement Baseline  wander in lead(s) I II aVR V6 No old tracing to compare Confirmed by  Othniel Maret  MD, Spiro Ausborn ([id number]) on 11/11/2013 12:55:48 AM        Lyanne CoKevin M Kaelen Caughlin, MD 11/11/13 [phone]

## 2013-11-11 NOTE — Discharge Instructions (Signed)
Please follow with your primary care doctor in the next 2 days for a check-up. They must obtain records for further management.  ° °Do not hesitate to return to the Emergency Department for any new, worsening or concerning symptoms.  ° ° °Chest Pain (Nonspecific) °It is often hard to give a diagnosis for the cause of chest pain. There is always a chance that your pain could be related to something serious, such as a heart attack or a blood clot in the lungs. You need to follow up with your doctor. °HOME CARE °· If antibiotic medicine was given, take it as directed by your doctor. Finish the medicine even if you start to feel better. °· For the next few days, avoid activities that bring on chest pain. Continue physical activities as told by your doctor. °· Do not use any tobacco products. This includes cigarettes, chewing tobacco, and e-cigarettes. °· Avoid drinking alcohol. °· Only take medicine as told by your doctor. °· Follow your doctor's suggestions for more testing if your chest pain does not go away. °· Keep all doctor visits you made. °GET HELP IF: °· Your chest pain does not go away, even after treatment. °· You have a rash with blisters on your chest. °· You have a fever. °GET HELP RIGHT AWAY IF:  °· You have more pain or pain that spreads to your arm, neck, jaw, back, or belly (abdomen). °· You have shortness of breath. °· You cough more than usual or cough up blood. °· You have very bad back or belly pain. °· You feel sick to your stomach (nauseous) or throw up (vomit). °· You have very bad weakness. °· You pass out (faint). °· You have chills. °This is an emergency. Do not wait to see if the problems will go away. Call your local emergency services (911 in [place] Do not drive yourself to the hospital. °MAKE SURE YOU:  °· Understand these instructions. °· Will watch your condition. °· Will get help right away if you are not doing well or get worse. °Document Released: 07/01/2007 Document Revised:  01/17/2013 Document Reviewed: 07/01/2007 °[employer]® Patient Information ©2015 [employer]. This information is not intended to replace advice given to you by your health care provider. Make sure you discuss any questions you have with your health care provider. ° °

## 2013-11-15 ENCOUNTER — Emergency Department (HOSPITAL_COMMUNITY)
Admission: EM | Admit: 2013-11-15 | Discharge: 2013-11-15 | Disposition: A | Discharge status: Home / Self Care | Payer: Managed Care, Other (non HMO) | Attending: Emergency Medicine | Admitting: Emergency Medicine

## 2013-11-15 ENCOUNTER — Emergency Department (HOSPITAL_COMMUNITY): Payer: Managed Care, Other (non HMO)

## 2013-11-15 ENCOUNTER — Encounter (HOSPITAL_COMMUNITY): Payer: Self-pay | Admitting: Emergency Medicine

## 2013-11-15 DIAGNOSIS — Z8719 Personal history of other diseases of the digestive system: Secondary | ICD-10-CM | POA: Insufficient documentation

## 2013-11-15 DIAGNOSIS — Z8709 Personal history of other diseases of the respiratory system: Secondary | ICD-10-CM | POA: Diagnosis not present

## 2013-11-15 DIAGNOSIS — Z79899 Other long term (current) drug therapy: Secondary | ICD-10-CM | POA: Diagnosis not present

## 2013-11-15 DIAGNOSIS — Y92410 Unspecified street and highway as the place of occurrence of the external cause: Secondary | ICD-10-CM | POA: Diagnosis not present

## 2013-11-15 DIAGNOSIS — Z720 Tobacco use: Secondary | ICD-10-CM | POA: Insufficient documentation

## 2013-11-15 DIAGNOSIS — G4700 Insomnia, unspecified: Secondary | ICD-10-CM | POA: Diagnosis not present

## 2013-11-15 DIAGNOSIS — F0781 Postconcussional syndrome: Secondary | ICD-10-CM | POA: Insufficient documentation

## 2013-11-15 DIAGNOSIS — Y9389 Activity, other specified: Secondary | ICD-10-CM | POA: Insufficient documentation

## 2013-11-15 DIAGNOSIS — F329 Major depressive disorder, single episode, unspecified: Secondary | ICD-10-CM | POA: Diagnosis not present

## 2013-11-15 DIAGNOSIS — G9389 Other specified disorders of brain: Secondary | ICD-10-CM | POA: Insufficient documentation

## 2013-11-15 DIAGNOSIS — S0990XA Unspecified injury of head, initial encounter: Secondary | ICD-10-CM | POA: Insufficient documentation

## 2013-11-15 DIAGNOSIS — F419 Anxiety disorder, unspecified: Secondary | ICD-10-CM | POA: Insufficient documentation

## 2013-11-15 DIAGNOSIS — Z8639 Personal history of other endocrine, nutritional and metabolic disease: Secondary | ICD-10-CM | POA: Insufficient documentation

## 2013-11-15 DIAGNOSIS — S199XXA Unspecified injury of neck, initial encounter: Secondary | ICD-10-CM | POA: Diagnosis not present

## 2013-11-15 DIAGNOSIS — F1010 Alcohol abuse, uncomplicated: Secondary | ICD-10-CM | POA: Diagnosis not present

## 2013-11-15 DIAGNOSIS — Z3202 Encounter for pregnancy test, result negative: Secondary | ICD-10-CM | POA: Insufficient documentation

## 2013-11-15 LAB — URINALYSIS, ROUTINE W REFLEX MICROSCOPIC
BILIRUBIN URINE: NEGATIVE
GLUCOSE, UA: NEGATIVE mg/dL
Hgb urine dipstick: NEGATIVE
KETONES UR: NEGATIVE mg/dL
Leukocytes, UA: NEGATIVE
Nitrite: NEGATIVE
Protein, ur: NEGATIVE mg/dL
SPECIFIC GRAVITY, URINE: 1.016 (ref 1.005–1.030)
Urobilinogen, UA: 0.2 mg/dL (ref 0.0–1.0)
pH: 6 (ref 5.0–8.0)

## 2013-11-15 LAB — BASIC METABOLIC PANEL
Anion gap: 12 (ref 5–15)
BUN: 11 mg/dL (ref 6–23)
CALCIUM: 9.5 mg/dL (ref 8.4–10.5)
CHLORIDE: 102 meq/L (ref 96–112)
CO2: 25 meq/L (ref 19–32)
CREATININE: 0.68 mg/dL (ref 0.50–1.10)
GFR calc Af Amer: >90 mL/min (ref >90)
GFR calc non Af Amer: >90 mL/min (ref >90)
Glucose, Bld: 93 mg/dL (ref 70–99)
Potassium: 3.9 mEq/L (ref 3.7–5.3)
Sodium: 139 mEq/L (ref 137–147)

## 2013-11-15 LAB — CBC WITH DIFFERENTIAL/PLATELET
BASOS ABS: 0 10*3/uL (ref 0.0–0.1)
Basophils Relative: 0 % (ref 0–1)
Eosinophils Absolute: 0.1 10*3/uL (ref 0.0–0.7)
Eosinophils Relative: 1 % (ref 0–5)
HCT: 39.7 % (ref 36.0–46.0)
Hemoglobin: 13 g/dL (ref 12.0–15.0)
LYMPHS PCT: 30 % (ref 12–46)
Lymphs Abs: 2.6 10*3/uL (ref 0.7–4.0)
MCH: 31.7 pg (ref 26.0–34.0)
MCHC: 32.7 g/dL (ref 30.0–36.0)
MCV: 96.8 fL (ref 78.0–100.0)
MONOS PCT: 4 % (ref 3–12)
Monocytes Absolute: 0.4 10*3/uL (ref 0.1–1.0)
NEUTROS ABS: 5.5 10*3/uL (ref 1.7–7.7)
Neutrophils Relative %: 65 % (ref 43–77)
Platelets: 208 10*3/uL (ref 150–400)
RBC: 4.1 MIL/uL (ref 3.87–5.11)
RDW: 11.9 % (ref 11.5–15.5)
WBC: 8.6 10*3/uL (ref 4.0–10.5)

## 2013-11-15 LAB — PREGNANCY, URINE: Preg Test, Ur: NEGATIVE

## 2013-11-15 MED ORDER — ACETAMINOPHEN 500 MG PO TABS
1000.0000 mg | ORAL_TABLET | Freq: Once | ORAL | Status: AC
  Administered 2013-11-15: 1000 mg via ORAL
  Filled 2013-11-15: qty 2

## 2013-11-15 NOTE — Discharge Instructions (Signed)
Concussion  A concussion, or closed-head injury, is a brain injury caused by a direct blow to the head or by a quick and sudden movement (jolt) of the head or neck. Concussions are usually not life-threatening. Even so, the effects of a concussion can be serious. If you have had a concussion before, you are more likely to experience concussion-like symptoms after a direct blow to the head.   CAUSES  · Direct blow to the head, such as from running into another player during a soccer game, being hit in a fight, or hitting your head on a hard surface.  · A jolt of the head or neck that causes the brain to move back and forth inside the skull, such as in a car crash.  SIGNS AND SYMPTOMS  The signs of a concussion can be hard to notice. Early on, they may be missed by you, family members, and health care providers. You may look fine but act or feel differently.  Symptoms are usually temporary, but they may last for days, weeks, or even longer. Some symptoms may appear right away while others may not show up for hours or days. Every head injury is different. Symptoms include:  · Mild to moderate headaches that will not go away.  · A feeling of pressure inside your head.  · Having more trouble than usual:  ¨ Learning or remembering things you have heard.  ¨ Answering questions.  ¨ Paying attention or concentrating.  ¨ Organizing daily tasks.  ¨ Making decisions and solving problems.  · Slowness in thinking, acting or reacting, speaking, or reading.  · Getting lost or being easily confused.  · Feeling tired all the time or lacking energy (fatigued).  · Feeling drowsy.  · Sleep disturbances.  ¨ Sleeping more than usual.  ¨ Sleeping less than usual.  ¨ Trouble falling asleep.  ¨ Trouble sleeping (insomnia).  · Loss of balance or feeling lightheaded or dizzy.  · Nausea or vomiting.  · Numbness or tingling.  · Increased sensitivity to:  ¨ Sounds.  ¨ Lights.  ¨ Distractions.  · Vision problems or eyes that tire  easily.  · Diminished sense of taste or smell.  · Ringing in the ears.  · Mood changes such as feeling sad or anxious.  · Becoming easily irritated or angry for little or no reason.  · Lack of motivation.  · Seeing or hearing things other people do not see or hear (hallucinations).  DIAGNOSIS  Your health care provider can usually diagnose a concussion based on a description of your injury and symptoms. He or she will ask whether you passed out (lost consciousness) and whether you are having trouble remembering events that happened right before and during your injury.  Your evaluation might include:  · A brain scan to look for signs of injury to the brain. Even if the test shows no injury, you may still have a concussion.  · Blood tests to be sure other problems are not present.  TREATMENT  · Concussions are usually treated in an emergency department, in urgent care, or at a clinic. You may need to stay in the hospital overnight for further treatment.  · Tell your health care provider if you are taking any medicines, including prescription medicines, over-the-counter medicines, and natural remedies. Some medicines, such as blood thinners (anticoagulants) and aspirin, may increase the chance of complications. Also tell your health care provider whether you have had alcohol or are taking illegal drugs. This information   may affect treatment.  · Your health care provider will send you home with important instructions to follow.  · How fast you will recover from a concussion depends on many factors. These factors include how severe your concussion is, what part of your brain was injured, your age, and how healthy you were before the concussion.  · Most people with mild injuries recover fully. Recovery can take time. In general, recovery is slower in older persons. Also, persons who have had a concussion in the past or have other medical problems may find that it takes longer to recover from their current injury.  HOME  CARE INSTRUCTIONS  General Instructions  · Carefully follow the directions your health care provider gave you.  · Only take over-the-counter or prescription medicines for pain, discomfort, or fever as directed by your health care provider.  · Take only those medicines that your health care provider has approved.  · Do not drink alcohol until your health care provider says you are well enough to do so. Alcohol and certain other drugs may slow your recovery and can put you at risk of further injury.  · If it is harder than usual to remember things, write them down.  · If you are easily distracted, try to do one thing at a time. For example, do not try to watch TV while fixing dinner.  · Talk with family members or close friends when making important decisions.  · Keep all follow-up appointments. Repeated evaluation of your symptoms is recommended for your recovery.  · Watch your symptoms and tell others to do the same. Complications sometimes occur after a concussion. Older adults with a brain injury may have a higher risk of serious complications, such as a blood clot on the brain.  · Tell your teachers, school nurse, school counselor, coach, athletic trainer, or work manager about your injury, symptoms, and restrictions. Tell them about what you can or cannot do. They should watch for:  ¨ Increased problems with attention or concentration.  ¨ Increased difficulty remembering or learning new information.  ¨ Increased time needed to complete tasks or assignments.  ¨ Increased irritability or decreased ability to cope with stress.  ¨ Increased symptoms.  · Rest. Rest helps the brain to heal. Make sure you:  ¨ Get plenty of sleep at night. Avoid staying up late at night.  ¨ Keep the same bedtime hours on weekends and weekdays.  ¨ Rest during the day. Take daytime naps or rest breaks when you feel tired.  · Limit activities that require a lot of thought or concentration. These include:  ¨ Doing homework or job-related  work.  ¨ Watching TV.  ¨ Working on the computer.  · Avoid any situation where there is potential for another head injury (football, hockey, soccer, basketball, martial arts, downhill snow sports and horseback riding). Your condition will get worse every time you experience a concussion. You should avoid these activities until you are evaluated by the appropriate follow-up health care providers.  Returning To Your Regular Activities  You will need to return to your normal activities slowly, not all at once. You must give your body and brain enough time for recovery.  · Do not return to sports or other athletic activities until your health care provider tells you it is safe to do so.  · Ask your health care provider when you can drive, ride a bicycle, or operate heavy machinery. Your ability to react may be slower after a   brain injury. Never do these activities if you are dizzy.  · Ask your health care provider about when you can return to work or school.  Preventing Another Concussion  It is very important to avoid another brain injury, especially before you have recovered. In rare cases, another injury can lead to permanent brain damage, brain swelling, or death. The risk of this is greatest during the first 7-10 days after a head injury. Avoid injuries by:  · Wearing a seat belt when riding in a car.  · Drinking alcohol only in moderation.  · Wearing a helmet when biking, skiing, skateboarding, skating, or doing similar activities.  · Avoiding activities that could lead to a second concussion, such as contact or recreational sports, until your health care provider says it is okay.  · Taking safety measures in your home.  ¨ Remove clutter and tripping hazards from floors and stairways.  ¨ Use grab bars in bathrooms and handrails by stairs.  ¨ Place non-slip mats on floors and in bathtubs.  ¨ Improve lighting in dim areas.  SEEK MEDICAL CARE IF:  · You have increased problems paying attention or  concentrating.  · You have increased difficulty remembering or learning new information.  · You need more time to complete tasks or assignments than before.  · You have increased irritability or decreased ability to cope with stress.  · You have more symptoms than before.  Seek medical care if you have any of the following symptoms for more than 2 weeks after your injury:  · Lasting (chronic) headaches.  · Dizziness or balance problems.  · Nausea.  · Vision problems.  · Increased sensitivity to noise or light.  · Depression or mood swings.  · Anxiety or irritability.  · Memory problems.  · Difficulty concentrating or paying attention.  · Sleep problems.  · Feeling tired all the time.  SEEK IMMEDIATE MEDICAL CARE IF:  · You have severe or worsening headaches. These may be a sign of a blood clot in the brain.  · You have weakness (even if only in one hand, leg, or part of the face).  · You have numbness.  · You have decreased coordination.  · You vomit repeatedly.  · You have increased sleepiness.  · One pupil is larger than the other.  · You have convulsions.  · You have slurred speech.  · You have increased confusion. This may be a sign of a blood clot in the brain.  · You have increased restlessness, agitation, or irritability.  · You are unable to recognize people or places.  · You have neck pain.  · It is difficult to wake you up.  · You have unusual behavior changes.  · You lose consciousness.  MAKE SURE YOU:  · Understand these instructions.  · Will watch your condition.  · Will get help right away if you are not doing well or get worse.  Document Released: 04/04/2003 Document Revised: 01/17/2013 Document Reviewed: 08/04/2012  [employer]® Patient Information ©2015 [employer]. This information is not intended to replace advice given to you by your health care provider. Make sure you discuss any questions you have with your health care provider.
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
## 2013-11-18 NOTE — ED Provider Notes (Signed)
Medical screening examination/treatment/procedure(s) were performed by non-physician practitioner and as supervising physician I was immediately available for consultation/collaboration.   EKG Interpretation   Date/Time:  Wednesday November 15 2013 19:02:10 EDT Ventricular Rate:  90 PR Interval:  120 QRS Duration: 82 QT Interval:  362 QTC Calculation: 443 R Axis:   80 Text Interpretation:  Sinus rhythm Baseline wander in lead(s) II aVF ED  PHYSICIAN INTERPRETATION AVAILABLE IN [facility] Confirmed by TEST,  Record ([id number]) on 11/17/2013 6:52:14 AM       Juliet RudeNathan R. Rubin PayorPickering, MD 11/18/13 [phone]

## 2013-12-17 ENCOUNTER — Other Ambulatory Visit: Payer: Self-pay | Admitting: Internal Medicine

## 2014-01-15 ENCOUNTER — Other Ambulatory Visit: Payer: Self-pay | Admitting: Internal Medicine

## 2014-01-16 NOTE — Telephone Encounter (Signed)
Faxed hardcopy for Clonazepam to [facility] [address] [place] 

## 2014-01-16 NOTE — Telephone Encounter (Signed)
Done hardcopy to robin  

## 2014-07-11 ENCOUNTER — Ambulatory Visit: Payer: Managed Care, Other (non HMO) | Admitting: Internal Medicine

## 2014-09-19 ENCOUNTER — Encounter (INDEPENDENT_AMBULATORY_CARE_PROVIDER_SITE_OTHER): Payer: Self-pay

## 2014-09-19 ENCOUNTER — Ambulatory Visit (INDEPENDENT_AMBULATORY_CARE_PROVIDER_SITE_OTHER): Payer: BLUE CROSS/BLUE SHIELD | Admitting: Internal Medicine

## 2014-09-19 ENCOUNTER — Encounter: Payer: Self-pay | Admitting: Emergency Medicine

## 2014-09-19 ENCOUNTER — Encounter: Payer: Self-pay | Admitting: Internal Medicine

## 2014-09-19 VITALS — BP 128/84 | HR 85 | Temp 97.9°F | Resp 18 | Wt 160.0 lb

## 2014-09-19 DIAGNOSIS — B360 Pityriasis versicolor: Secondary | ICD-10-CM | POA: Diagnosis not present

## 2014-09-19 DIAGNOSIS — J309 Allergic rhinitis, unspecified: Secondary | ICD-10-CM | POA: Diagnosis not present

## 2014-09-19 MED ORDER — AMOXICILLIN 500 MG PO CAPS: 500.0000 mg | ORAL_CAPSULE | Freq: Three times a day (TID) | ORAL | Status: DC

## 2014-09-19 NOTE — Patient Instructions (Signed)
Apply Selsun Blue to the rash at night and shower it off each morning. Continue this 3 X/week until the rash has resolved.    Plain Mucinex (NOT D) for thick secretions ;force NON dairy fluids .   Nasal cleansing in the shower as discussed with lather of mild shampoo.After 10 seconds wash off lather while  exhaling through nostrils. Make sure that all residual soap is removed to prevent irritation.  Flonase OR Nasacort AQ 1 spray in each nostril twice a day as needed. Use the "crossover" technique into opposite nostril spraying toward opposite ear @ 45 degree angle, not straight up into nostril.  Plain Allegra (NOT D )  160 daily , Loratidine 10 mg , OR Zyrtec 10 mg @ bedtime  as needed for itchy eyes & sneezing.  Zicam Melts or Zinc lozenges as per package label for sore throat .  Complementary options to boost immunity include  vitamin C 2000 mg daily; & Echinacea for 4-7 days.   Fill the  prescription for antibiotic if fever; discolored nasal or chest secretions; or frontal headache or facial pain  present

## 2014-09-19 NOTE — Progress Notes (Signed)
   Subjective:    Patient ID: [name], female    DOB: May 24, 1980, 34 y.o.   MRN: [id number]  HPI  Her symptoms began 09/16/14 as frontal headache, facial discomfort, and paranasal pressure. Secretions from the nose were clear. She also began to have a cough with clear sputum. She questioned chills or possible fever. She has random sneezing and itchy/watery eyes with this is in the context of perennial rhinitis with seasonal exacerbations. She has no history of asthma. She smokes a pack a day. She's been using Claritin-D.  Review of Systems Nasal purulence, dental pain, sore throat , otic pain or otic discharge denied. No documented fever.  There is no purulent sputum production, wheezing,or  paroxysmal nocturnal dyspnea. She has no associated shortness of breath or wheezing. She is concerned about a hypopigmented rash over the upper posterior thorax.     Objective:   Physical Exam  General appearance:Adequately nourished; no acute distress or increased work of breathing is present.    Lymphatic: No  lymphadenopathy about the head, neck, or axilla .  Eyes: No conjunctival inflammation or lid edema is present. There is no scleral icterus.  Ears:  External ear exam shows no significant lesions or deformities.  Minimal erythema of the right tympanic membrane. Left tympanic membrane normal.  Nose:  External nasal examination shows no deformity or inflammation. Nasal mucosa are minimally erythematous without lesions or exudates No septal dislocation or deviation.No obstruction to airflow.   Oral exam: Dental hygiene is good; lips and gums are healthy appearing.There is no oropharyngeal erythema or exudate .  Neck:  No deformities, thyromegaly, masses, or tenderness noted.   Supple with full range of motion without pain.   Heart:  Normal rate and regular rhythm. S1 and S2 normal without gallop, murmur, click, rub or other extra sounds.   Lungs:Chest clear to auscultation; no wheezes,  rhonchi,rales ,or rubs present.  Extremities:  No cyanosis, edema, or clubbing  noted    Skin: Warm & dry w/o tenting or jaundice.Classic tinea versicolor upper back.      Assessment & Plan:  #1 allergic rhinitis #2 tinea versicolor See orders & AVS

## 2014-09-19 NOTE — Progress Notes (Signed)
Pre visit review using our clinic review tool, if applicable. No additional management support is needed unless otherwise documented below in the visit note. 

## 2014-09-20 ENCOUNTER — Ambulatory Visit: Payer: Managed Care, Other (non HMO) | Admitting: Internal Medicine

## 2014-09-20 DIAGNOSIS — B360 Pityriasis versicolor: Secondary | ICD-10-CM | POA: Insufficient documentation

## 2014-09-27 ENCOUNTER — Other Ambulatory Visit: Payer: Self-pay | Admitting: Internal Medicine

## 2014-09-28 ENCOUNTER — Other Ambulatory Visit: Payer: Self-pay | Admitting: Internal Medicine

## 2014-10-02 NOTE — Telephone Encounter (Signed)
Done hardcopy to dahlia/LIM B  

## 2014-10-02 NOTE — Telephone Encounter (Signed)
Rx faxed to pharmacy  

## 2014-10-03 ENCOUNTER — Other Ambulatory Visit: Payer: Self-pay | Admitting: Internal Medicine

## 2014-12-06 ENCOUNTER — Other Ambulatory Visit: Payer: Self-pay | Admitting: Obstetrics and Gynecology

## 2014-12-07 LAB — CYTOLOGY - PAP

## 2015-01-25 ENCOUNTER — Other Ambulatory Visit (INDEPENDENT_AMBULATORY_CARE_PROVIDER_SITE_OTHER): Payer: BLUE CROSS/BLUE SHIELD

## 2015-01-25 ENCOUNTER — Ambulatory Visit (INDEPENDENT_AMBULATORY_CARE_PROVIDER_SITE_OTHER): Payer: BLUE CROSS/BLUE SHIELD | Admitting: Internal Medicine

## 2015-01-25 VITALS — BP 138/92 | HR 92 | Temp 97.9°F | Resp 18 | Wt 173.0 lb

## 2015-01-25 DIAGNOSIS — B373 Candidiasis of vulva and vagina: Secondary | ICD-10-CM

## 2015-01-25 DIAGNOSIS — R300 Dysuria: Secondary | ICD-10-CM

## 2015-01-25 DIAGNOSIS — J069 Acute upper respiratory infection, unspecified: Secondary | ICD-10-CM | POA: Diagnosis not present

## 2015-01-25 DIAGNOSIS — B3731 Acute candidiasis of vulva and vagina: Secondary | ICD-10-CM

## 2015-01-25 LAB — URINALYSIS, ROUTINE W REFLEX MICROSCOPIC
BILIRUBIN URINE: NEGATIVE
HGB URINE DIPSTICK: NEGATIVE
Ketones, ur: NEGATIVE
Leukocytes, UA: NEGATIVE
NITRITE: NEGATIVE
RBC / HPF: NONE SEEN (ref 0–?)
Specific Gravity, Urine: <=1.005 — AB (ref 1.000–1.030)
Total Protein, Urine: NEGATIVE
Urine Glucose: NEGATIVE
Urobilinogen, UA: 0.2 (ref 0.0–1.0)
pH: 6 (ref 5.0–8.0)

## 2015-01-25 MED ORDER — NAPROXEN 500 MG PO TABS: 500.0000 mg | ORAL_TABLET | Freq: Two times a day (BID) | ORAL | Status: DC

## 2015-01-25 MED ORDER — FLUCONAZOLE 150 MG PO TABS: 150.0000 mg | ORAL_TABLET | Freq: Once | ORAL | Status: DC

## 2015-01-25 NOTE — Patient Instructions (Addendum)
  Test(s) ordered today. Your results will be released to MyChart (or called to you) after review, usually within 72hours after test completion. If any changes need to be made, you will be notified at that same time.  I have prescribed diflucan for your yeast infection.    Your prescription(s) have been submitted to your pharmacy. Please take as directed and contact our office if you believe you are having problem(s) with the medication(s).   For your cold symptoms:  If your symptoms worsen or fail to improve, please contact our office for further instruction, or in case of emergency go directly to the emergency room at the closest medical facility.   General Recommendations:    Please drink plenty of fluids.  Get plenty of rest   Sleep in humidified air  Use saline nasal sprays  Netti pot  OTC Medications:  Decongestants - helps relieve congestion   Flonase (generic fluticasone) or Nasacort (generic triamcinolone) - please make sure to use the "cross-over" technique at a 45 degree angle towards the opposite eye as opposed to straight up the nasal passageway.   Sudafed (generic pseudoephedrine - Note this is the one that is available behind the pharmacy counter); Products with phenylephrine (-PE) may also be used but is often not as effective as pseudoephedrine.   If you have HIGH BLOOD PRESSURE - Coricidin HBP; AVOID any product that is -D as this contains pseudoephedrine which may increase your blood pressure.  Afrin (oxymetazoline) every 6-8 hours for up to 3 days.  Allergies - helps relieve runny nose, itchy eyes and sneezing   Claritin (generic loratidine), Allegra (fexofenidine), or Zyrtec (generic cyrterizine) for runny nose. These medications should not cause drowsiness.  Note - Benadryl (generic diphenhydramine) may be used however may cause drowsiness  Cough -   Delsym or Robitussin (generic dextromethorphan)  Expectorants - helps loosen mucus to  ease removal   Mucinex (generic guaifenesin) as directed on the package.  Headaches / General Aches   Tylenol (generic acetaminophen) - DO NOT EXCEED 3 grams (3,000 mg) in a 24 hour time period  Advil/Motrin (generic ibuprofen)  Sore Throat -   Salt water gargle   Chloraseptic (generic benzocaine) spray or lozenges / Sucrets (generic dyclonine)
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
## 2015-01-25 NOTE — Progress Notes (Signed)
Pre visit review using our clinic review tool, if applicable. No additional management support is needed unless otherwise documented below in the visit note. 

## 2015-01-26 LAB — URINE CULTURE
COLONY COUNT: NO GROWTH
Organism ID, Bacteria: NO GROWTH

## 2015-01-27 ENCOUNTER — Encounter: Payer: Self-pay | Admitting: Internal Medicine

## 2015-02-06 ENCOUNTER — Other Ambulatory Visit: Payer: Self-pay | Admitting: Internal Medicine

## 2015-02-13 ENCOUNTER — Ambulatory Visit (INDEPENDENT_AMBULATORY_CARE_PROVIDER_SITE_OTHER): Payer: BLUE CROSS/BLUE SHIELD | Admitting: Internal Medicine

## 2015-02-13 ENCOUNTER — Encounter: Payer: Self-pay | Admitting: Internal Medicine

## 2015-02-13 VITALS — BP 134/82 | HR 86 | Temp 98.3°F | Ht 69.0 in | Wt 170.0 lb

## 2015-02-13 DIAGNOSIS — N926 Irregular menstruation, unspecified: Secondary | ICD-10-CM | POA: Insufficient documentation

## 2015-02-13 DIAGNOSIS — F411 Generalized anxiety disorder: Secondary | ICD-10-CM | POA: Diagnosis not present

## 2015-02-13 DIAGNOSIS — Z7251 High risk heterosexual behavior: Secondary | ICD-10-CM

## 2015-02-13 DIAGNOSIS — R110 Nausea: Secondary | ICD-10-CM

## 2015-02-13 LAB — POCT URINE PREGNANCY: Preg Test, Ur: NEGATIVE

## 2015-02-13 MED ORDER — PANTOPRAZOLE SODIUM 40 MG PO TBEC: 40.0000 mg | DELAYED_RELEASE_TABLET | Freq: Every day | ORAL | Status: DC

## 2015-02-13 MED ORDER — ONDANSETRON HCL 4 MG PO TABS: 4.0000 mg | ORAL_TABLET | Freq: Three times a day (TID) | ORAL | Status: DC | PRN

## 2015-02-13 MED ORDER — CLONAZEPAM 0.5 MG PO TABS: ORAL_TABLET | ORAL | Status: DC

## 2015-02-13 NOTE — Patient Instructions (Signed)
Your urine pregnancy test was negative  Please take all new medication as prescribed - the nausea medication, and protonix for possible stomach acid related nausea  Please continue all other medications as before, and refills have been done if requested - the klonopin  Please have the pharmacy call with any other refills you may need.  You will be contacted regarding the referral for: counseling  Please keep your appointments with your specialists as you may have planned  Please go to the LAB in the Basement (turn left off the elevator) for the tests to be done tomorrow - the blood pregnancy testing, and urine  You will be contacted by phone if any changes need to be made immediately.  Otherwise, you will receive a letter about your results with an explanation, but please check with MyChart first.  Please remember to sign up for MyChart if you have not done so, as this will be important to you in the future with finding out test results, communicating by private email, and scheduling acute appointments online when needed.

## 2015-02-13 NOTE — Progress Notes (Signed)
Subjective:    Patient ID: [name], female    DOB: 1980/10/17, 35 y.o.   MRN: [id number]  HPI  Here to f/u after 2 days missed menses, had some nausea this am without vomiting, diarrhea, fever or pain, and Denies worsening reflux, abd pain, dysphagia, other bowel change or blood. Is asking for pregnancy testing after recent unprotected intercourse.  Pt denies chest pain, increased sob or doe, wheezing, orthopnea, PND, increased LE swelling, palpitations, dizziness or syncope.  Pt denies new neurological symptoms such as new headache, or facial or extremity weakness or numbness   Pt denies polydipsia, polyuria, Denies worsening depressive symptoms, suicidal ideation, or panic; has ongoing anxiety, worse recently, has new job and relationship Past Medical History  Diagnosis Date  . HYPERLIPIDEMIA 09/26/2008  . ANXIETY 09/26/2008  . DEPRESSION 09/26/2008  . ADD 10/22/2009  . OTITIS MEDIA, LEFT 01/14/2009  . TONSILLITIS, ACUTE 01/14/2009  . URI 10/22/2009  . ALLERGIC RHINITIS 09/26/2008  . CONSTIPATION 09/26/2008  . SKIN LESION 09/26/2008  . FATIGUE 01/14/2009   Past Surgical History  Procedure Laterality Date  . Extraction of wisdom teeth      error in charting  . Polyp removal       reports that she has been smoking Cigarettes.  She has never used smokeless tobacco. She reports that she drinks alcohol. She reports that she does not use illicit drugs. family history includes Diabetes in her other; Hypertension in her mother and other; Thyroid disease in her mother. No Known Allergies Current Outpatient Prescriptions on File Prior to Visit  Medication Sig Dispense Refill  . escitalopram (LEXAPRO) 10 MG tablet TAKE 1 TABLET BY MOUTH EVERY DAY 90 tablet 0  . fluconazole (DIFLUCAN) 150 MG tablet Take 1 tablet (150 mg total) by mouth once. 1 tablet 0  . ibuprofen (ADVIL,MOTRIN) 200 MG tablet Take 600-800 mg by mouth every 6 (six) hours as needed for headache.    . naproxen (NAPROSYN) 500 MG  tablet Take 1 tablet (500 mg total) by mouth 2 (two) times daily with a meal. 30 tablet 0  . OVER THE COUNTER MEDICATION Take 1-2 tablets by mouth daily.     No current facility-administered medications on file prior to visit.   Review of Systems  Constitutional: Negative for unusual diaphoresis or night sweats HENT: Negative for ringing in ear or discharge Eyes: Negative for double vision or worsening visual disturbance.  Respiratory: Negative for choking and stridor.   Gastrointestinal: Negative for vomiting or other signifcant bowel change Genitourinary: Negative for hematuria or change in urine volume.  Musculoskeletal: Negative for other MSK pain or swelling Skin: Negative for color change and worsening wound.  Neurological: Negative for tremors and numbness other than noted  Psychiatric/Behavioral: Negative for decreased concentration or agitation other than above       Objective:   Physical Exam BP 134/82 mmHg  Pulse 86  Temp(Src) 98.3 F (36.8 C) (Oral)  Ht  (1.753 m)  Wt 170 lb (77.111 kg)  BMI 25.09 kg/m2  SpO2 97%  LMP 01/13/2015 VS noted,  Constitutional: Pt appears in no significant distress HENT: Head: NCAT.  Right Ear: External ear normal.  Left Ear: External ear normal.  Eyes: . Pupils are equal, round, and reactive to light. Conjunctivae and EOM are normal Neck: Normal range of motion. Neck supple.  Cardiovascular: Normal rate and regular rhythm.   Pulmonary/Chest: Effort normal and breath sounds without rales or wheezing.  Abd:  Soft, NT, ND, +  BS Neurological: Pt is alert. Not confused , motor grossly intact Skin: Skin is warm. No rash, no LE edema Psychiatric: Pt behavior is normal. No agitation. 2+ nervous   POCT urine pregnancy  Status: Finalresult Visible to patient:  Not Released Dx:  Unprotected sexual intercourse           Ref Range 6:15 PM  50yr ago  29yr ago  67yr ago     Preg Test, Ur Negative  Negative                Assessment & Plan:

## 2015-02-13 NOTE — Progress Notes (Signed)
Pre visit review using our clinic review tool, if applicable. No additional management support is needed unless otherwise documented below in the visit note. 

## 2015-02-21 NOTE — Assessment & Plan Note (Addendum)
?   Gastritis vs other, also check UA, for protonix daily trial,  to f/u any worsening symptoms or concerns

## 2015-02-21 NOTE — Assessment & Plan Note (Signed)
situationally worse it  Seems with ongoing stressors, for klonopin prn, refer counseling,  to f/u any worsening symptoms or concerns

## 2015-02-21 NOTE — Assessment & Plan Note (Signed)
Pregnancy neg, she is asking for blood testing as well, ok for this,  to f/u any worsening symptoms or concerns

## 2015-03-20 ENCOUNTER — Ambulatory Visit: Payer: BLUE CROSS/BLUE SHIELD | Admitting: Licensed Clinical Social Worker

## 2015-03-27 ENCOUNTER — Other Ambulatory Visit: Payer: Self-pay | Admitting: Obstetrics and Gynecology

## 2015-04-23 ENCOUNTER — Other Ambulatory Visit: Payer: Self-pay | Admitting: Internal Medicine

## 2015-05-11 ENCOUNTER — Emergency Department (HOSPITAL_COMMUNITY): Payer: BLUE CROSS/BLUE SHIELD

## 2015-05-11 ENCOUNTER — Emergency Department (HOSPITAL_COMMUNITY)
Admission: EM | Admit: 2015-05-11 | Discharge: 2015-05-11 | Disposition: A | Discharge status: Home / Self Care | Payer: BLUE CROSS/BLUE SHIELD | Attending: Emergency Medicine | Admitting: Emergency Medicine

## 2015-05-11 ENCOUNTER — Encounter (HOSPITAL_COMMUNITY): Payer: Self-pay | Admitting: Emergency Medicine

## 2015-05-11 DIAGNOSIS — F17210 Nicotine dependence, cigarettes, uncomplicated: Secondary | ICD-10-CM | POA: Insufficient documentation

## 2015-05-11 DIAGNOSIS — Z8709 Personal history of other diseases of the respiratory system: Secondary | ICD-10-CM | POA: Insufficient documentation

## 2015-05-11 DIAGNOSIS — S40022A Contusion of left upper arm, initial encounter: Secondary | ICD-10-CM | POA: Diagnosis not present

## 2015-05-11 DIAGNOSIS — S8012XA Contusion of left lower leg, initial encounter: Secondary | ICD-10-CM | POA: Insufficient documentation

## 2015-05-11 DIAGNOSIS — S8011XA Contusion of right lower leg, initial encounter: Secondary | ICD-10-CM | POA: Insufficient documentation

## 2015-05-11 DIAGNOSIS — Y9289 Other specified places as the place of occurrence of the external cause: Secondary | ICD-10-CM | POA: Insufficient documentation

## 2015-05-11 DIAGNOSIS — S20212A Contusion of left front wall of thorax, initial encounter: Secondary | ICD-10-CM | POA: Insufficient documentation

## 2015-05-11 DIAGNOSIS — S50312A Abrasion of left elbow, initial encounter: Secondary | ICD-10-CM | POA: Diagnosis not present

## 2015-05-11 DIAGNOSIS — S80211A Abrasion, right knee, initial encounter: Secondary | ICD-10-CM | POA: Diagnosis not present

## 2015-05-11 DIAGNOSIS — S5001XA Contusion of right elbow, initial encounter: Secondary | ICD-10-CM | POA: Diagnosis not present

## 2015-05-11 DIAGNOSIS — S0083XA Contusion of other part of head, initial encounter: Secondary | ICD-10-CM | POA: Diagnosis not present

## 2015-05-11 DIAGNOSIS — Y9389 Activity, other specified: Secondary | ICD-10-CM | POA: Diagnosis not present

## 2015-05-11 DIAGNOSIS — S1093XA Contusion of unspecified part of neck, initial encounter: Secondary | ICD-10-CM | POA: Diagnosis not present

## 2015-05-11 DIAGNOSIS — S8001XA Contusion of right knee, initial encounter: Secondary | ICD-10-CM | POA: Diagnosis not present

## 2015-05-11 DIAGNOSIS — F419 Anxiety disorder, unspecified: Secondary | ICD-10-CM | POA: Insufficient documentation

## 2015-05-11 DIAGNOSIS — S8002XA Contusion of left knee, initial encounter: Secondary | ICD-10-CM | POA: Diagnosis not present

## 2015-05-11 DIAGNOSIS — S40021A Contusion of right upper arm, initial encounter: Secondary | ICD-10-CM | POA: Insufficient documentation

## 2015-05-11 DIAGNOSIS — S0990XA Unspecified injury of head, initial encounter: Secondary | ICD-10-CM | POA: Diagnosis not present

## 2015-05-11 DIAGNOSIS — S50311A Abrasion of right elbow, initial encounter: Secondary | ICD-10-CM | POA: Insufficient documentation

## 2015-05-11 DIAGNOSIS — Y998 Other external cause status: Secondary | ICD-10-CM | POA: Diagnosis not present

## 2015-05-11 DIAGNOSIS — S5002XA Contusion of left elbow, initial encounter: Secondary | ICD-10-CM | POA: Insufficient documentation

## 2015-05-11 DIAGNOSIS — S80212A Abrasion, left knee, initial encounter: Secondary | ICD-10-CM | POA: Insufficient documentation

## 2015-05-11 DIAGNOSIS — S199XXA Unspecified injury of neck, initial encounter: Secondary | ICD-10-CM | POA: Diagnosis not present

## 2015-05-11 DIAGNOSIS — Z8639 Personal history of other endocrine, nutritional and metabolic disease: Secondary | ICD-10-CM | POA: Insufficient documentation

## 2015-05-11 DIAGNOSIS — Z8719 Personal history of other diseases of the digestive system: Secondary | ICD-10-CM | POA: Insufficient documentation

## 2015-05-11 DIAGNOSIS — S0993XA Unspecified injury of face, initial encounter: Secondary | ICD-10-CM | POA: Diagnosis not present

## 2015-05-11 DIAGNOSIS — R0682 Tachypnea, not elsewhere classified: Secondary | ICD-10-CM | POA: Diagnosis not present

## 2015-05-11 DIAGNOSIS — Z3202 Encounter for pregnancy test, result negative: Secondary | ICD-10-CM | POA: Insufficient documentation

## 2015-05-11 DIAGNOSIS — S3991XA Unspecified injury of abdomen, initial encounter: Secondary | ICD-10-CM | POA: Diagnosis not present

## 2015-05-11 DIAGNOSIS — S0003XA Contusion of scalp, initial encounter: Secondary | ICD-10-CM | POA: Diagnosis not present

## 2015-05-11 DIAGNOSIS — F329 Major depressive disorder, single episode, unspecified: Secondary | ICD-10-CM | POA: Insufficient documentation

## 2015-05-11 DIAGNOSIS — Z8669 Personal history of other diseases of the nervous system and sense organs: Secondary | ICD-10-CM | POA: Insufficient documentation

## 2015-05-11 DIAGNOSIS — Z23 Encounter for immunization: Secondary | ICD-10-CM | POA: Insufficient documentation

## 2015-05-11 DIAGNOSIS — S299XXA Unspecified injury of thorax, initial encounter: Secondary | ICD-10-CM | POA: Diagnosis not present

## 2015-05-11 DIAGNOSIS — Z79899 Other long term (current) drug therapy: Secondary | ICD-10-CM | POA: Insufficient documentation

## 2015-05-11 DIAGNOSIS — M25561 Pain in right knee: Secondary | ICD-10-CM | POA: Diagnosis not present

## 2015-05-11 DIAGNOSIS — Z872 Personal history of diseases of the skin and subcutaneous tissue: Secondary | ICD-10-CM | POA: Insufficient documentation

## 2015-05-11 DIAGNOSIS — S2020XA Contusion of thorax, unspecified, initial encounter: Secondary | ICD-10-CM

## 2015-05-11 LAB — BASIC METABOLIC PANEL
ANION GAP: 11 (ref 5–15)
BUN: 13 mg/dL (ref 6–20)
CHLORIDE: 109 mmol/L (ref 101–111)
CO2: 22 mmol/L (ref 22–32)
Calcium: 9.3 mg/dL (ref 8.9–10.3)
Creatinine, Ser: 0.7 mg/dL (ref 0.44–1.00)
GFR calc Af Amer: >60 mL/min (ref >60)
GLUCOSE: 114 mg/dL — AB (ref 65–99)
POTASSIUM: 3.7 mmol/L (ref 3.5–5.1)
Sodium: 142 mmol/L (ref 135–145)

## 2015-05-11 LAB — URINALYSIS, ROUTINE W REFLEX MICROSCOPIC
Bilirubin Urine: NEGATIVE
GLUCOSE, UA: NEGATIVE mg/dL
Hgb urine dipstick: NEGATIVE
Ketones, ur: NEGATIVE mg/dL
LEUKOCYTES UA: NEGATIVE
Nitrite: NEGATIVE
PROTEIN: NEGATIVE mg/dL
pH: 5 (ref 5.0–8.0)

## 2015-05-11 LAB — I-STAT BETA HCG BLOOD, ED (MC, WL, AP ONLY)

## 2015-05-11 LAB — CBC
HCT: 39.8 % (ref 36.0–46.0)
HEMOGLOBIN: 13.3 g/dL (ref 12.0–15.0)
MCH: 30.9 pg (ref 26.0–34.0)
MCHC: 33.4 g/dL (ref 30.0–36.0)
MCV: 92.3 fL (ref 78.0–100.0)
PLATELETS: 244 10*3/uL (ref 150–400)
RBC: 4.31 MIL/uL (ref 3.87–5.11)
RDW: 12.3 % (ref 11.5–15.5)
WBC: 16.7 10*3/uL — AB (ref 4.0–10.5)

## 2015-05-11 MED ORDER — DIPHENHYDRAMINE HCL 25 MG PO CAPS
25.0000 mg | ORAL_CAPSULE | Freq: Once | ORAL | Status: AC
  Administered 2015-05-11: 25 mg via ORAL
  Filled 2015-05-11: qty 1

## 2015-05-11 MED ORDER — ACETAMINOPHEN 325 MG PO TABS: 325.0000 mg | ORAL_TABLET | Freq: Once | ORAL | Status: DC

## 2015-05-11 MED ORDER — SODIUM CHLORIDE 0.9 % IV BOLUS (SEPSIS)
1000.0000 mL | Freq: Once | INTRAVENOUS | Status: AC
  Administered 2015-05-11: 1000 mL via INTRAVENOUS

## 2015-05-11 MED ORDER — KETOROLAC TROMETHAMINE 15 MG/ML IJ SOLN
15.0000 mg | Freq: Once | INTRAMUSCULAR | Status: AC
  Administered 2015-05-11: 15 mg via INTRAMUSCULAR
  Filled 2015-05-11: qty 1

## 2015-05-11 MED ORDER — IOPAMIDOL (ISOVUE-300) INJECTION 61%
100.0000 mL | Freq: Once | INTRAVENOUS | Status: AC | PRN
  Administered 2015-05-11: 100 mL via INTRAVENOUS

## 2015-05-11 MED ORDER — METOCLOPRAMIDE HCL 10 MG PO TABS
10.0000 mg | ORAL_TABLET | Freq: Once | ORAL | Status: AC
  Administered 2015-05-11: 10 mg via ORAL
  Filled 2015-05-11: qty 1

## 2015-05-11 MED ORDER — TETANUS-DIPHTH-ACELL PERTUSSIS 5-2.5-18.5 LF-MCG/0.5 IM SUSP
0.5000 mL | Freq: Once | INTRAMUSCULAR | Status: AC
  Administered 2015-05-11: 0.5 mL via INTRAMUSCULAR
  Filled 2015-05-11: qty 0.5

## 2015-05-11 MED ORDER — ONDANSETRON HCL 4 MG/2ML IJ SOLN
4.0000 mg | Freq: Once | INTRAMUSCULAR | Status: AC
  Administered 2015-05-11: 4 mg via INTRAVENOUS
  Filled 2015-05-11: qty 2

## 2015-05-11 MED ORDER — LORAZEPAM 2 MG/ML IJ SOLN
1.0000 mg | Freq: Once | INTRAMUSCULAR | Status: AC
  Administered 2015-05-11: 1 mg via INTRAVENOUS
  Filled 2015-05-11: qty 1
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
## 2015-05-11 NOTE — ED Notes (Signed)
Pt cannot use restroom at this time, aware urine specimen is needed.  
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
## 2015-05-11 NOTE — ED Notes (Signed)
CID at bedside.  I spoke with pts sister Oswaldo Conroyammie Erickson [phone] per pts request, she will be here asap. Per patients request she will not call patients mother.
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
## 2015-05-11 NOTE — ED Notes (Signed)
Sane RN at bedside.

## 2015-05-15 ENCOUNTER — Ambulatory Visit (INDEPENDENT_AMBULATORY_CARE_PROVIDER_SITE_OTHER): Payer: BLUE CROSS/BLUE SHIELD | Admitting: Internal Medicine

## 2015-05-15 ENCOUNTER — Encounter: Payer: Self-pay | Admitting: Internal Medicine

## 2015-05-15 VITALS — HR 75 | Temp 98.1°F | Ht 69.0 in | Wt 160.5 lb

## 2015-05-15 DIAGNOSIS — T148 Other injury of unspecified body region: Secondary | ICD-10-CM

## 2015-05-15 DIAGNOSIS — T07XXXA Unspecified multiple injuries, initial encounter: Secondary | ICD-10-CM | POA: Insufficient documentation

## 2015-05-15 DIAGNOSIS — F411 Generalized anxiety disorder: Secondary | ICD-10-CM | POA: Diagnosis not present

## 2015-05-15 MED ORDER — TRAMADOL HCL 50 MG PO TABS: 50.0000 mg | ORAL_TABLET | Freq: Three times a day (TID) | ORAL | Status: DC | PRN

## 2015-05-15 MED ORDER — CYCLOBENZAPRINE HCL 5 MG PO TABS: 5.0000 mg | ORAL_TABLET | Freq: Three times a day (TID) | ORAL | Status: DC | PRN

## 2015-05-15 NOTE — Assessment & Plan Note (Signed)
[facility], to cont counseling or let us know if need further refrral

## 2015-05-15 NOTE — Progress Notes (Signed)
Pre visit review using our clinic review tool, if applicable. No additional management support is needed unless otherwise documented below in the visit note. 
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
## 2015-05-15 NOTE — Patient Instructions (Addendum)
Please take all new medication as prescribed - the pain medication and muscle relaxer as needed  Please continue all other medications as before, and refills have been done if requested.  Please have the pharmacy call with any other refills you may need.  Please keep your appointments with your specialists as you may have planned  You are given the work note  Please call if you need referral for counseling services
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
## 2015-06-27 ENCOUNTER — Other Ambulatory Visit: Payer: Self-pay | Admitting: Internal Medicine

## 2015-08-01 ENCOUNTER — Other Ambulatory Visit: Payer: Self-pay | Admitting: *Deleted

## 2015-08-01 MED ORDER — ESCITALOPRAM OXALATE 10 MG PO TABS: 10.0000 mg | ORAL_TABLET | Freq: Every day | ORAL | Status: DC

## 2015-09-12 ENCOUNTER — Ambulatory Visit (INDEPENDENT_AMBULATORY_CARE_PROVIDER_SITE_OTHER): Payer: BLUE CROSS/BLUE SHIELD | Admitting: Internal Medicine

## 2015-09-12 ENCOUNTER — Encounter: Payer: Self-pay | Admitting: Internal Medicine

## 2015-09-12 VITALS — BP 118/64 | HR 105 | Temp 98.4°F | Resp 20 | Wt 166.0 lb

## 2015-09-12 DIAGNOSIS — F909 Attention-deficit hyperactivity disorder, unspecified type: Secondary | ICD-10-CM | POA: Diagnosis not present

## 2015-09-12 DIAGNOSIS — J019 Acute sinusitis, unspecified: Secondary | ICD-10-CM

## 2015-09-12 DIAGNOSIS — F988 Other specified behavioral and emotional disorders with onset usually occurring in childhood and adolescence: Secondary | ICD-10-CM

## 2015-09-12 DIAGNOSIS — F411 Generalized anxiety disorder: Secondary | ICD-10-CM | POA: Diagnosis not present

## 2015-09-12 MED ORDER — AMPHETAMINE-DEXTROAMPHET ER 20 MG PO CP24: 20.0000 mg | ORAL_CAPSULE | ORAL | 0 refills | Status: DC

## 2015-09-12 MED ORDER — CLONAZEPAM 0.5 MG PO TABS: ORAL_TABLET | ORAL | 2 refills | Status: DC

## 2015-09-12 MED ORDER — LEVOFLOXACIN 500 MG PO TABS: 500.0000 mg | ORAL_TABLET | Freq: Every day | ORAL | 0 refills | Status: AC

## 2015-09-12 NOTE — Assessment & Plan Note (Signed)
Mod with few intermitent panic, for klonopin prn, to f/u any worsening symptoms or concerns

## 2015-09-12 NOTE — Assessment & Plan Note (Signed)
Mild to mod, for antibx course,  to f/u any worsening symptoms or concerns 

## 2015-09-12 NOTE — Patient Instructions (Signed)
Please take all new medication as prescribed - the antibiotic, klonopin and adderall  Please continue all other medications as before, and refills have been done if requested.  Please have the pharmacy call with any other refills you may need.  Please keep your appointments with your specialists as you may have planned

## 2015-09-12 NOTE — Progress Notes (Signed)
Subjective:    Patient ID: [name], female    DOB: 06/10/1980, 35 y.o.   MRN: [id number]  HPI   Here with 2-3 days acute onset fever, facial pain, pressure, headache, general weakness and malaise, and greenish d/c, with mild ST and cough, but pt denies chest pain, wheezing, increased sob or doe, orthopnea, PND, increased LE swelling, palpitations, dizziness or syncope.  Denies worsening depressive symptoms, suicidal ideation, or panic; has ongoing anxiety, marked increased recently with occasional panic like symptoms due to increased stressors.  Starting school soon, would like to re-start adderall to help with studies focus.   Past Medical History:  Diagnosis Date  . ADD 10/22/2009  . ALLERGIC RHINITIS 09/26/2008  . ANXIETY 09/26/2008  . CONSTIPATION 09/26/2008  . DEPRESSION 09/26/2008  . FATIGUE 01/14/2009  . HYPERLIPIDEMIA 09/26/2008  . OTITIS MEDIA, LEFT 01/14/2009  . SKIN LESION 09/26/2008  . TONSILLITIS, ACUTE 01/14/2009  . URI 10/22/2009   Past Surgical History:  Procedure Laterality Date  . extraction of wisdom teeth     error in charting  . polyp removal       reports that she has been smoking Cigarettes.  She has never used smokeless tobacco. She reports that she drinks alcohol. She reports that she does not use drugs. family history includes Diabetes in her other; Hypertension in her mother and other; Thyroid disease in her mother. No Known Allergies Current Outpatient Prescriptions on File Prior to Visit  Medication Sig Dispense Refill  . cyclobenzaprine (FLEXERIL) 5 MG tablet TAKE 1 TABLET BY MOUTH 3 TIMES A DAY AS NEEDED FOR MUSCLE SPASMS 60 tablet 1  . escitalopram (LEXAPRO) 10 MG tablet Take 1 tablet (10 mg total) by mouth daily. 90 tablet 1  . ondansetron (ZOFRAN) 4 MG tablet Take 1 tablet (4 mg total) by mouth every 8 (eight) hours as needed for nausea or vomiting. 30 tablet 0  . OVER THE COUNTER MEDICATION Take 1-2 tablets by mouth daily.    . pantoprazole (PROTONIX)  40 MG tablet Take 1 tablet (40 mg total) by mouth daily. 90 tablet 3  . traMADol (ULTRAM) 50 MG tablet Take 1 tablet (50 mg total) by mouth every 8 (eight) hours as needed. 40 tablet 0  . VIORELE 0.15-0.02/0.01 MG (21/5) tablet Take 1 tablet by mouth daily.  11   No current facility-administered medications on file prior to visit.    Review of Systems  Constitutional: Negative for unusual diaphoresis or night sweats HENT: Negative for ear swelling or discharge Eyes: Negative for worsening visual haziness  Respiratory: Negative for choking and stridor.   Gastrointestinal: Negative for distension or worsening eructation Genitourinary: Negative for retention or change in urine volume.  Musculoskeletal: Negative for other MSK pain or swelling Skin: Negative for color change and worsening wound Neurological: Negative for tremors and numbness other than noted  Psychiatric/Behavioral: Negative for decreased concentration or agitation other than above       Objective:   Physical Exam BP 118/64   Pulse (!) 105   Temp 98.4 F (36.9 C) (Oral)   Resp 20   Wt 166 lb (75.3 kg)   SpO2 97%   BMI 24.51 kg/m  VS noted,  Constitutional: Pt appears in no apparent distress HENT: Head: NCAT.  Right Ear: External ear normal.  Left Ear: External ear normal.  Bilat tm's with mild erythema.  Max sinus areas mild tender.  Pharynx with mild erythema, no exudate Eyes: . Pupils are equal, round, and  reactive to light. Conjunctivae and EOM are normal Neck: Normal range of motion. Neck supple.  Cardiovascular: Normal rate and regular rhythm.   Pulmonary/Chest: Effort normal and breath sounds without rales or wheezing.  Neurological: Pt is alert. Not confused , motor grossly intact Skin: Skin is warm. No rash, no LE edema Psychiatric: Pt behavior is normal. No agitation. 1-2+ nervous    Assessment & Plan:

## 2015-09-12 NOTE — Progress Notes (Signed)
Letter sent.

## 2015-09-12 NOTE — Progress Notes (Signed)
Pre visit review using our clinic review tool, if applicable. No additional management support is needed unless otherwise documented below in the visit note. 

## 2015-09-12 NOTE — Assessment & Plan Note (Signed)
Mild to mod, for adderall asd,  to f/u any worsening symptoms or concerns

## 2015-12-05 ENCOUNTER — Telehealth: Payer: Self-pay | Admitting: Internal Medicine

## 2015-12-05 MED ORDER — AMPHETAMINE-DEXTROAMPHET ER 20 MG PO CP24: 20.0000 mg | ORAL_CAPSULE | ORAL | 0 refills | Status: DC

## 2015-12-05 NOTE — Telephone Encounter (Signed)
Patient is requesting refill on adderall.   °

## 2015-12-05 NOTE — Telephone Encounter (Signed)
Done hardcopy to Corinne  

## 2015-12-05 NOTE — Telephone Encounter (Signed)
Patient aware.

## 2016-01-01 ENCOUNTER — Encounter: Payer: Self-pay | Admitting: Family

## 2016-01-01 ENCOUNTER — Ambulatory Visit (INDEPENDENT_AMBULATORY_CARE_PROVIDER_SITE_OTHER): Payer: BLUE CROSS/BLUE SHIELD | Admitting: Family

## 2016-01-01 VITALS — BP 134/88 | HR 84 | Temp 98.1°F | Resp 16 | Ht 69.0 in | Wt 168.0 lb

## 2016-01-01 DIAGNOSIS — J019 Acute sinusitis, unspecified: Secondary | ICD-10-CM

## 2016-01-01 MED ORDER — PROMETHAZINE-CODEINE 6.25-10 MG/5ML PO SYRP: 5.0000 mL | ORAL_SOLUTION | Freq: Four times a day (QID) | ORAL | 0 refills | Status: DC | PRN

## 2016-01-01 MED ORDER — AZITHROMYCIN 250 MG PO TABS: ORAL_TABLET | ORAL | 0 refills | Status: DC

## 2016-01-01 NOTE — Assessment & Plan Note (Signed)
Symptoms and exam consistent with acute sinusitis with possibility of viral infection. Recommend conservative treatment with over-the-counter medications as needed for symptom relief and supportive care. Written prescription for azithromycin provided if symptoms worsen or do not improve. Instructions on watchful waiting reviewed. Start promethazine with codeine as needed for cough and sleep.

## 2016-01-01 NOTE — Patient Instructions (Signed)
Thank you for choosing [facility].  SUMMARY AND INSTRUCTIONS:  Medication:  Your prescription(s) have been submitted to your pharmacy or been printed and provided for you. Please take as directed and contact our office if you believe you are having problem(s) with the medication(s) or have any questions.   Follow up:  If your symptoms worsen or fail to improve, please contact our office for further instruction, or in case of emergency go directly to the emergency room at the closest medical facility.    General Recommendations:    Please drink plenty of fluids.  Get plenty of rest   Sleep in humidified air  Use saline nasal sprays  Netti pot   OTC Medications:  Decongestants - helps relieve congestion   Flonase (generic fluticasone) or Nasacort (generic triamcinolone) - please make sure to use the "cross-over" technique at a 45 degree angle towards the opposite eye as opposed to straight up the nasal passageway.   Sudafed (generic pseudoephedrine - Note this is the one that is available behind the pharmacy counter); Products with phenylephrine (-PE) may also be used but is often not as effective as pseudoephedrine.   If you have HIGH BLOOD PRESSURE - Coricidin HBP; AVOID any product that is -D as this contains pseudoephedrine which may increase your blood pressure.  Afrin (oxymetazoline) every 6-8 hours for up to 3 days.   Allergies - helps relieve runny nose, itchy eyes and sneezing   Claritin (generic loratidine), Allegra (fexofenidine), or Zyrtec (generic cyrterizine) for runny nose. These medications should not cause drowsiness.  Note - Benadryl (generic diphenhydramine) may be used however may cause drowsiness  Cough -   Delsym or Robitussin (generic dextromethorphan)  Expectorants - helps loosen mucus to ease removal   Mucinex (generic guaifenesin) as directed on the package.  Headaches / General Aches   Tylenol (generic acetaminophen) - DO  NOT EXCEED 3 grams (3,000 mg) in a 24 hour time period  Advil/Motrin (generic ibuprofen)   Sore Throat -   Salt water gargle   Chloraseptic (generic benzocaine) spray or lozenges / Sucrets (generic dyclonine)    Sinusitis Sinusitis is redness, soreness, and inflammation of the paranasal sinuses. Paranasal sinuses are air pockets within the bones of your face (beneath the eyes, the middle of the forehead, or above the eyes). In healthy paranasal sinuses, mucus is able to drain out, and air is able to circulate through them by way of your nose. However, when your paranasal sinuses are inflamed, mucus and air can become trapped. This can allow bacteria and other germs to grow and cause infection. Sinusitis can develop quickly and last only a short time (acute) or continue over a long period (chronic). Sinusitis that lasts for more than 12 weeks is considered chronic.  CAUSES  Causes of sinusitis include:  Allergies.  Structural abnormalities, such as displacement of the cartilage that separates your nostrils (deviated septum), which can decrease the air flow through your nose and sinuses and affect sinus drainage.  Functional abnormalities, such as when the small hairs (cilia) that line your sinuses and help remove mucus do not work properly or are not present. SIGNS AND SYMPTOMS  Symptoms of acute and chronic sinusitis are the same. The primary symptoms are pain and pressure around the affected sinuses. Other symptoms include:  Upper toothache.  Earache.  Headache.  Bad breath.  Decreased sense of smell and taste.  A cough, which worsens when you are lying flat.  Fatigue.  Fever.  Thick drainage   from your nose, which often is green and may contain pus (purulent).  Swelling and warmth over the affected sinuses. DIAGNOSIS  Your health care provider will perform a physical exam. During the exam, your health care provider may:  Look in your nose for signs of abnormal growths  in your nostrils (nasal polyps).  Tap over the affected sinus to check for signs of infection.  View the inside of your sinuses (endoscopy) using an imaging device that has a light attached (endoscope). If your health care provider suspects that you have chronic sinusitis, one or more of the following tests may be recommended:  Allergy tests.  Nasal culture. A sample of mucus is taken from your nose, sent to a lab, and screened for bacteria.  Nasal cytology. A sample of mucus is taken from your nose and examined by your health care provider to determine if your sinusitis is related to an allergy. TREATMENT  Most cases of acute sinusitis are related to a viral infection and will resolve on their own within 10 days. Sometimes medicines are prescribed to help relieve symptoms (pain medicine, decongestants, nasal steroid sprays, or saline sprays).  However, for sinusitis related to a bacterial infection, your health care provider will prescribe antibiotic medicines. These are medicines that will help kill the bacteria causing the infection.  Rarely, sinusitis is caused by a fungal infection. In theses cases, your health care provider will prescribe antifungal medicine. For some cases of chronic sinusitis, surgery is needed. Generally, these are cases in which sinusitis recurs more than 3 times per year, despite other treatments. HOME CARE INSTRUCTIONS   Drink plenty of water. Water helps thin the mucus so your sinuses can drain more easily.  Use a humidifier.  Inhale steam 3 to 4 times a day (for example, sit in the bathroom with the shower running).  Apply a warm, moist washcloth to your face 3 to 4 times a day, or as directed by your health care provider.  Use saline nasal sprays to help moisten and clean your sinuses.  Take medicines only as directed by your health care provider.  If you were prescribed either an antibiotic or antifungal medicine, finish it all even if you start to feel  better. SEEK IMMEDIATE MEDICAL CARE IF:  You have increasing pain or severe headaches.  You have nausea, vomiting, or drowsiness.  You have swelling around your face.  You have vision problems.  You have a stiff neck.  You have difficulty breathing. MAKE SURE YOU:   Understand these instructions.  Will watch your condition.  Will get help right away if you are not doing well or get worse. Document Released: 01/12/2005 Document Revised: 05/29/2013 Document Reviewed: 01/27/2011 Exit[employer] Patient Information 2015 [employer]. This information is not intended to replace advice given to you by your health care provider. Make sure you discuss any questions you have with your health care provider.   

## 2016-01-01 NOTE — Progress Notes (Signed)
Subjective:    Patient ID: [name], female    DOB: 07/13/1980, 35 y.o.   MRN: [id number]  Chief Complaint  Patient presents with  . Cough    cough, congestion, burning in throat and ears, body aches, starting sunday    HPI:  Janet [name] is a 35 y.o. female who  has a past medical history of ADD (10/22/2009); ALLERGIC RHINITIS (09/26/2008); ANXIETY (09/26/2008); CONSTIPATION (09/26/2008); DEPRESSION (09/26/2008); FATIGUE (01/14/2009); HYPERLIPIDEMIA (09/26/2008); OTITIS MEDIA, LEFT (01/14/2009); SKIN LESION (09/26/2008); TONSILLITIS, ACUTE (01/14/2009); and URI (10/22/2009). and presents today for an acute office visit.  This is a new problem. Associated symptoms of cough, congestion, burning in throat and ears and body aches that has been going on for about 4 days. No fevers. Modifying factors include sinus medication and Advil which helped with here symptoms a little. No recent antibiotics. Does have several sick contacts.    No Known Allergies    Outpatient Medications Prior to Visit  Medication Sig Dispense Refill  . amphetamine-dextroamphetamine (ADDERALL XR) 20 MG 24 hr capsule Take 1 capsule (20 mg total) by mouth every morning. To fill Dec 10, 2015 30 capsule 0  . clonazePAM (KLONOPIN) 0.5 MG tablet TAKE 1 - 2 TABLET BY MOUTH EVERY DAY AS NEEDED FOR ANXIETY 60 tablet 2  . escitalopram (LEXAPRO) 10 MG tablet Take 1 tablet (10 mg total) by mouth daily. 90 tablet 1  . OVER THE COUNTER MEDICATION Take 1-2 tablets by mouth daily.    [name]. VIORELE 0.15-0.02/0.01 MG (21/5) tablet Take 1 tablet by mouth daily.  11  . pantoprazole (PROTONIX) 40 MG tablet Take 1 tablet (40 mg total) by mouth daily. 90 tablet 3  . cyclobenzaprine (FLEXERIL) 5 MG tablet TAKE 1 TABLET BY MOUTH 3 TIMES A DAY AS NEEDED FOR MUSCLE SPASMS 60 tablet 1  . ondansetron (ZOFRAN) 4 MG tablet Take 1 tablet (4 mg total) by mouth every 8 (eight) hours as needed for nausea or vomiting. 30 tablet 0  . traMADol (ULTRAM) 50 MG  tablet Take 1 tablet (50 mg total) by mouth every 8 (eight) hours as needed. 40 tablet 0   No facility-administered medications prior to visit.      Review of Systems  Constitutional: Negative for chills and fever.  HENT: Positive for ear pain, sinus pain, sinus pressure and sore throat. Negative for congestion.   Respiratory: Positive for cough. Negative for chest tightness and shortness of breath.   Neurological: Positive for headaches.      Objective:    BP 134/88 (BP Location: Left Arm, Patient Position: Sitting, Cuff Size: Normal)   Pulse 84   Temp 98.1 F (36.7 C) (Oral)   Resp 16   Ht 5\' 9"  (1.753 m)   Wt 168 lb (76.2 kg)   SpO2 98%   BMI 24.81 kg/m  Nursing note and vital signs reviewed.  Physical Exam  Constitutional: She is oriented to person, place, and time. She appears well-developed and well-nourished.  HENT:  Right Ear: Hearing, tympanic membrane, external ear and ear canal normal.  Left Ear: Hearing, tympanic membrane, external ear and ear canal normal.  Nose: Right sinus exhibits maxillary sinus tenderness and frontal sinus tenderness. Left sinus exhibits maxillary sinus tenderness and frontal sinus tenderness.  Mouth/Throat: Uvula is midline, oropharynx is clear and moist and mucous membranes are normal.  Neck: Neck supple.  Cardiovascular: Normal rate, regular rhythm, normal heart sounds and intact distal pulses.   Pulmonary/Chest: Effort normal and breath  sounds normal.  Neurological: She is alert and oriented to person, place, and time.  Skin: Skin is warm and dry.       Assessment & Plan:   Problem List Items Addressed This Visit      Respiratory   Acute sinus infection - Primary    Symptoms and exam consistent with acute sinusitis with possibility of viral infection. Recommend conservative treatment with over-the-counter medications as needed for symptom relief and supportive care. Written prescription for azithromycin provided if symptoms worsen  or do not improve. Instructions on watchful waiting reviewed. Start promethazine with codeine as needed for cough and sleep.      Relevant Medications   azithromycin (ZITHROMAX) 250 MG tablet   promethazine-codeine (PHENERGAN WITH CODEINE) 6.25-10 MG/5ML syrup       I have discontinued Ms. Marney DoctorKenney's ondansetron, pantoprazole, traMADol, and cyclobenzaprine. I am also having her start on azithromycin and promethazine-codeine. Additionally, I am having her maintain her OVER THE COUNTER MEDICATION, VIORELE, escitalopram, clonazePAM, and amphetamine-dextroamphetamine.   Meds ordered this encounter  Medications  . azithromycin (ZITHROMAX) 250 MG tablet    Sig: Take 2 tablets by mouth for 1 day and then 1 tablet by mouth for 4 days.    Dispense:  6 tablet    Refill:  0    Order Specific Question:   Supervising Provider    Answer:   Hillard DankerRAWFORD, ELIZABETH A [[id number]]  . promethazine-codeine (PHENERGAN WITH CODEINE) 6.25-10 MG/5ML syrup    Sig: Take 5 mLs by mouth every 6 (six) hours as needed for cough.    Dispense:  180 mL    Refill:  0    Order Specific Question:   Supervising Provider    Answer:   Hillard DankerRAWFORD, ELIZABETH A [[id number]]     Follow-up: Return if symptoms worsen or fail to improve.  Jeanine Luzalone, Gregory, FNP

## 2016-01-10 ENCOUNTER — Telehealth: Payer: Self-pay | Admitting: *Deleted

## 2016-01-10 MED ORDER — CLONAZEPAM 0.5 MG PO TABS: ORAL_TABLET | ORAL | 2 refills | Status: DC

## 2016-01-10 MED ORDER — AMPHETAMINE-DEXTROAMPHET ER 20 MG PO CP24: 20.0000 mg | ORAL_CAPSULE | ORAL | 0 refills | Status: DC

## 2016-01-10 NOTE — Telephone Encounter (Signed)
Done hardcopy to Corinne including one rx for adderall as this was all that was requested

## 2016-01-10 NOTE — Telephone Encounter (Signed)
Called patient, unable to reach, left message on voicemail advising that both medications have been placed up front in the front office and are ready to be picked up.

## 2016-01-10 NOTE — Telephone Encounter (Signed)
Rec'd call pt requesting refill on Adderrall & Klonopin...Raechel Chute/lmb

## 2016-02-11 ENCOUNTER — Telehealth: Payer: Self-pay | Admitting: *Deleted

## 2016-02-11 MED ORDER — AMPHETAMINE-DEXTROAMPHET ER 20 MG PO CP24: 20.0000 mg | ORAL_CAPSULE | ORAL | 0 refills | Status: DC

## 2016-02-11 NOTE — Telephone Encounter (Signed)
Requesting refill on her Adderrall.../lmb 

## 2016-02-11 NOTE — Telephone Encounter (Signed)
Done hardcopy to Corinne  

## 2016-02-11 NOTE — Telephone Encounter (Signed)
Called patient available for pick up

## 2016-02-17 DIAGNOSIS — Z1159 Encounter for screening for other viral diseases: Secondary | ICD-10-CM | POA: Diagnosis not present

## 2016-02-17 DIAGNOSIS — Z6823 Body mass index (BMI) 23.0-23.9, adult: Secondary | ICD-10-CM | POA: Diagnosis not present

## 2016-02-17 DIAGNOSIS — Z114 Encounter for screening for human immunodeficiency virus [HIV]: Secondary | ICD-10-CM | POA: Diagnosis not present

## 2016-02-17 DIAGNOSIS — Z113 Encounter for screening for infections with a predominantly sexual mode of transmission: Secondary | ICD-10-CM | POA: Diagnosis not present

## 2016-02-17 DIAGNOSIS — Z01419 Encounter for gynecological examination (general) (routine) without abnormal findings: Secondary | ICD-10-CM | POA: Diagnosis not present

## 2016-02-17 DIAGNOSIS — R109 Unspecified abdominal pain: Secondary | ICD-10-CM | POA: Diagnosis not present

## 2016-02-17 DIAGNOSIS — Z3200 Encounter for pregnancy test, result unknown: Secondary | ICD-10-CM | POA: Diagnosis not present

## 2016-02-18 ENCOUNTER — Telehealth: Payer: Self-pay | Admitting: *Deleted

## 2016-02-18 NOTE — Telephone Encounter (Signed)
Rec'd call from pt stating that [facility] will not refill her Adderral script. They are saying she can not get filled until Feb 2nd, and she don't know why. Called [facility] spoke w/tech she stated that the pt turn in script for Dec on 1/2, therefore she is not due until 2/2. She states it looks like she turn in Dec late. They have dates filled for 9/16, 10/16, and 11/17, Dec was turn in 1/2. Tried calling pt back no answer LMOM RTC...Raechel Chute/lmb

## 2016-02-18 NOTE — Telephone Encounter (Signed)
Pt called in and said that she was returning your call??   °

## 2016-02-28 ENCOUNTER — Encounter: Payer: Self-pay | Admitting: Adult Health

## 2016-02-28 ENCOUNTER — Ambulatory Visit (INDEPENDENT_AMBULATORY_CARE_PROVIDER_SITE_OTHER): Payer: BLUE CROSS/BLUE SHIELD | Admitting: Adult Health

## 2016-02-28 VITALS — BP 110/64 | Temp 98.2°F | Ht 69.0 in

## 2016-02-28 DIAGNOSIS — J0140 Acute pansinusitis, unspecified: Secondary | ICD-10-CM | POA: Diagnosis not present

## 2016-02-28 DIAGNOSIS — A084 Viral intestinal infection, unspecified: Secondary | ICD-10-CM

## 2016-02-28 MED ORDER — ONDANSETRON HCL 4 MG PO TABS: 4.0000 mg | ORAL_TABLET | Freq: Three times a day (TID) | ORAL | 0 refills | Status: DC | PRN

## 2016-02-28 NOTE — Progress Notes (Signed)
Subjective:    Patient ID: [name], female    DOB: Feb 09, 1980, 36 y.o.   MRN: [id number]  HPI  36 year old female who presents to the office with the following acute symptoms: fatigue, sneezing, chills, sinus pain/pressure, diarrhea, and nausea x 3 days.   She denies any fever, vomiting, productive cough, shortness of breath or feeling ill.   She has been using Mucinex and normal saline which has helped   Review of Systems See HPI  Past Medical History:  Diagnosis Date  . ADD 10/22/2009  . ALLERGIC RHINITIS 09/26/2008  . ANXIETY 09/26/2008  . CONSTIPATION 09/26/2008  . DEPRESSION 09/26/2008  . FATIGUE 01/14/2009  . HYPERLIPIDEMIA 09/26/2008  . OTITIS MEDIA, LEFT 01/14/2009  . SKIN LESION 09/26/2008  . TONSILLITIS, ACUTE 01/14/2009  . URI 10/22/2009    Social History   Social History  . Marital status: Single    Spouse name: N/A  . Number of children: N/A  . Years of education: N/A   Occupational History  . full time waitress    Social History Main Topics  . Smoking status: Current Every Day Smoker    Types: Cigarettes  . Smokeless tobacco: Never Used  . Alcohol use Yes  . Drug use: No  . Sexual activity: Not on file   Other Topics Concern  . Not on file   Social History Narrative   Lives with mother    Past Surgical History:  Procedure Laterality Date  . extraction of wisdom teeth     error in charting  . polyp removal       Family History  Problem Relation Age of Onset  . Diabetes Other   . Hypertension Other   . Thyroid disease Mother   . Hypertension Mother     No Known Allergies  Current Outpatient Prescriptions on File Prior to Visit  Medication Sig Dispense Refill  . amphetamine-dextroamphetamine (ADDERALL XR) 20 MG 24 hr capsule Take 1 capsule (20 mg total) by mouth every morning. To fill Jan 10, 2016 30 capsule 0  . clonazePAM (KLONOPIN) 0.5 MG tablet TAKE 1 - 2 TABLET BY MOUTH EVERY DAY AS NEEDED FOR ANXIETY 60 tablet 2  . escitalopram  (LEXAPRO) 10 MG tablet Take 1 tablet (10 mg total) by mouth daily. 90 tablet 1  . OVER THE COUNTER MEDICATION Take 1-2 tablets by mouth daily.    . promethazine-codeine (PHENERGAN WITH CODEINE) 6.25-10 MG/5ML syrup Take 5 mLs by mouth every 6 (six) hours as needed for cough. 180 mL 0  . VIORELE 0.15-0.02/0.01 MG (21/5) tablet Take 1 tablet by mouth daily.  11   No current facility-administered medications on file prior to visit.     Temp 98.2 F (36.8 C) (Oral)   Ht 5\' 9"  (1.753 m)       Objective:   Physical Exam  Constitutional: She is oriented to person, place, and time. She appears well-developed and well-nourished. No distress.  HENT:  Head: Normocephalic and atraumatic.  Right Ear: External ear normal.  Left Ear: External ear normal.  Nose: No mucosal edema or rhinorrhea. Right sinus exhibits maxillary sinus tenderness and frontal sinus tenderness. Left sinus exhibits maxillary sinus tenderness and frontal sinus tenderness.  Mouth/Throat: Uvula is midline and oropharynx is clear and moist. No oropharyngeal exudate.  Eyes: Conjunctivae and EOM are normal. Pupils are equal, round, and reactive to light. Right eye exhibits no discharge. Left eye exhibits no discharge. No scleral icterus.  Neck: Normal range  of motion. Neck supple. No thyromegaly present.  Cardiovascular: Normal rate, regular rhythm, normal heart sounds and intact distal pulses.  Exam reveals no gallop and no friction rub.   No murmur heard. Pulmonary/Chest: Effort normal and breath sounds normal. No respiratory distress. She has no wheezes. She has no rales. She exhibits no tenderness.  Abdominal: Soft. Bowel sounds are normal. She exhibits no distension and no mass. There is no tenderness. There is no rebound.  Lymphadenopathy:    She has no cervical adenopathy.  Neurological: She is alert and oriented to person, place, and time.  Skin: Skin is warm and dry. No rash noted. She is not diaphoretic. No erythema. No  pallor.  Psychiatric: She has a normal mood and affect. Her behavior is normal. Judgment and thought content normal.  Nursing note and vitals reviewed.     Assessment & Plan:  1. Acute non-recurrent pansinusitis - Likely viral in nature - Flonase and decongestant   2. Viral gastroenteritis - ondansetron (ZOFRAN) 4 MG tablet; Take 1 tablet (4 mg total) by mouth every 8 (eight) hours as needed for nausea or vomiting.  Dispense: 20 tablet; Refill: 0 - Rest and stay hydrated.   Follow up with PCP if no improvement in the next 2-3 days   Shirline Freesory Chawn Spraggins, NP

## 2016-03-27 ENCOUNTER — Telehealth: Payer: Self-pay | Admitting: *Deleted

## 2016-03-27 MED ORDER — AMPHETAMINE-DEXTROAMPHET ER 20 MG PO CP24: 20.0000 mg | ORAL_CAPSULE | ORAL | 0 refills | Status: DC

## 2016-03-27 NOTE — Telephone Encounter (Signed)
Called pt no answer LMOM rx ready for pick-up.../lmb 

## 2016-03-27 NOTE — Telephone Encounter (Signed)
Rec'd call pt requesting refill on her Adderrall.../lmb 

## 2016-03-27 NOTE — Telephone Encounter (Signed)
Done hardcopy to Anna  

## 2016-03-30 ENCOUNTER — Other Ambulatory Visit: Payer: Self-pay | Admitting: Internal Medicine

## 2016-04-22 ENCOUNTER — Telehealth: Payer: Self-pay | Admitting: *Deleted

## 2016-04-22 MED ORDER — AMPHETAMINE-DEXTROAMPHET ER 20 MG PO CP24: 20.0000 mg | ORAL_CAPSULE | ORAL | 0 refills | Status: DC

## 2016-04-22 NOTE — Telephone Encounter (Signed)
Done hardcopy to Shirron  

## 2016-04-22 NOTE — Telephone Encounter (Signed)
Rec'd call pt requesting refill on her adderrall.../lmb 

## 2016-04-22 NOTE — Telephone Encounter (Signed)
Called pt. No Vm left due to mailbox being full. Script at front desk.

## 2016-05-06 DIAGNOSIS — M9905 Segmental and somatic dysfunction of pelvic region: Secondary | ICD-10-CM | POA: Diagnosis not present

## 2016-05-06 DIAGNOSIS — M791 Myalgia: Secondary | ICD-10-CM | POA: Diagnosis not present

## 2016-05-06 DIAGNOSIS — M9903 Segmental and somatic dysfunction of lumbar region: Secondary | ICD-10-CM | POA: Diagnosis not present

## 2016-05-06 DIAGNOSIS — R51 Headache: Secondary | ICD-10-CM | POA: Diagnosis not present

## 2016-05-06 DIAGNOSIS — M9902 Segmental and somatic dysfunction of thoracic region: Secondary | ICD-10-CM | POA: Diagnosis not present

## 2016-05-06 DIAGNOSIS — M9901 Segmental and somatic dysfunction of cervical region: Secondary | ICD-10-CM | POA: Diagnosis not present

## 2016-05-06 DIAGNOSIS — M62830 Muscle spasm of back: Secondary | ICD-10-CM | POA: Diagnosis not present

## 2016-05-08 DIAGNOSIS — R51 Headache: Secondary | ICD-10-CM | POA: Diagnosis not present

## 2016-05-08 DIAGNOSIS — M9903 Segmental and somatic dysfunction of lumbar region: Secondary | ICD-10-CM | POA: Diagnosis not present

## 2016-05-08 DIAGNOSIS — M791 Myalgia: Secondary | ICD-10-CM | POA: Diagnosis not present

## 2016-05-08 DIAGNOSIS — M62830 Muscle spasm of back: Secondary | ICD-10-CM | POA: Diagnosis not present

## 2016-05-08 DIAGNOSIS — M9902 Segmental and somatic dysfunction of thoracic region: Secondary | ICD-10-CM | POA: Diagnosis not present

## 2016-05-08 DIAGNOSIS — M9901 Segmental and somatic dysfunction of cervical region: Secondary | ICD-10-CM | POA: Diagnosis not present

## 2016-05-08 DIAGNOSIS — M9905 Segmental and somatic dysfunction of pelvic region: Secondary | ICD-10-CM | POA: Diagnosis not present

## 2016-05-14 ENCOUNTER — Ambulatory Visit (INDEPENDENT_AMBULATORY_CARE_PROVIDER_SITE_OTHER): Payer: BLUE CROSS/BLUE SHIELD | Admitting: Internal Medicine

## 2016-05-14 ENCOUNTER — Encounter: Payer: Self-pay | Admitting: Internal Medicine

## 2016-05-14 VITALS — BP 124/78 | HR 89 | Ht 69.0 in | Wt 166.0 lb

## 2016-05-14 DIAGNOSIS — Z0000 Encounter for general adult medical examination without abnormal findings: Secondary | ICD-10-CM | POA: Diagnosis not present

## 2016-05-14 DIAGNOSIS — F411 Generalized anxiety disorder: Secondary | ICD-10-CM | POA: Diagnosis not present

## 2016-05-14 DIAGNOSIS — J309 Allergic rhinitis, unspecified: Secondary | ICD-10-CM

## 2016-05-14 DIAGNOSIS — M5412 Radiculopathy, cervical region: Secondary | ICD-10-CM

## 2016-05-14 DIAGNOSIS — F988 Other specified behavioral and emotional disorders with onset usually occurring in childhood and adolescence: Secondary | ICD-10-CM

## 2016-05-14 MED ORDER — CLONAZEPAM 0.5 MG PO TABS: ORAL_TABLET | ORAL | 2 refills | Status: DC

## 2016-05-14 MED ORDER — AMPHETAMINE-DEXTROAMPHET ER 20 MG PO CP24: 20.0000 mg | ORAL_CAPSULE | ORAL | 0 refills | Status: DC

## 2016-05-14 MED ORDER — AMPHETAMINE-DEXTROAMPHET ER 20 MG PO CP24
20.0000 mg | ORAL_CAPSULE | ORAL | 0 refills | Status: DC
Start: 2016-05-14 — End: 2016-05-14

## 2016-05-14 NOTE — Patient Instructions (Addendum)
Please take all new medication as prescribed - the muscle relaxer and anti-inflammatory  Please call in 1 wk if not better for a Orthopedic referral  OK to take OTC allegra for the allergies  Please continue all other medications as before, and refills have been done if requested.  Please have the pharmacy call with any other refills you may need.  Please keep your appointments with your specialists as you may have planned  Please return in 1 year for your yearly visit, or sooner if needed

## 2016-05-14 NOTE — Progress Notes (Signed)
Subjective:    Patient ID: [name], female    DOB: [birth year], 36 y.o.   MRN: [id number]  HPI  Here for wellness and f/u;  Overall doing ok;  Pt denies Chest pain, worsening SOB, DOE, wheezing, orthopnea, PND, worsening LE edema, palpitations, dizziness or syncope.  Pt denies neurological change such as new headache, facial or extremity weakness.  Pt denies polydipsia, polyuria, or low sugar symptoms. Pt states overall good compliance with treatment and medications, good tolerability, and has been trying to follow appropriate diet.  Pt denies worsening depressive symptoms, suicidal ideation or panic. No fever, night sweats, wt loss, loss of appetite, or other constitutional symptoms.  Pt states good ability with ADL's, has low fall risk, home safety reviewed and adequate, no other significant changes in hearing or vision, and only occasionally active with exercise. Also Here with right shoulder and right lateral hip pain, not really better with massage tx,then better for a short time with chiropracter, but now wtill hurts Right shoudler and neck pain haas been mod to severe for 9 days,some better now, wax and wanes, mostly constant, worse to pick up heavy things and left overhead, has some radiation worsening to the distal RUE, some numbness in a few fingers, and has been dropping things with right hand. Right hip pain has been intermitent since 36yo accident at home; has also some right  Knee and foot arch.  Also with occasional vague, HA's.  [facility] labs today. Past Medical History:  Diagnosis Date  . ADD 10/22/2009  . ALLERGIC RHINITIS 09/26/2008  . ANXIETY 09/26/2008  . CONSTIPATION 09/26/2008  . DEPRESSION 09/26/2008  . FATIGUE 01/14/2009  . HYPERLIPIDEMIA 09/26/2008  . OTITIS MEDIA, LEFT 01/14/2009  . SKIN LESION 09/26/2008  . TONSILLITIS, ACUTE 01/14/2009  . URI 10/22/2009   Past Surgical History:  Procedure Laterality Date  . extraction of wisdom teeth     error in charting  . polyp  removal       reports that she has been smoking Cigarettes.  She has never used smokeless tobacco. She reports that she drinks alcohol. She reports that she does not use drugs. family history includes Diabetes in her other; Hypertension in her mother and other; Thyroid disease in her mother. No Known Allergies Current Outpatient Prescriptions on File Prior to Visit  Medication Sig Dispense Refill  . escitalopram (LEXAPRO) 10 MG tablet TAKE 1 TABLET BY MOUTH EVERY DAY 90 tablet 1  . OVER THE COUNTER MEDICATION Take 1-2 tablets by mouth daily.    Marland Kitchen VIORELE 0.15-0.02/0.01 MG (21/5) tablet Take 1 tablet by mouth daily.  11   No current facility-administered medications on file prior to visit.    Review of Systems Constitutional: Negative for other unusual diaphoresis, sweats, appetite or weight changes HENT: Negative for other worsening hearing loss, ear pain, facial swelling, mouth sores or neck stiffness.   Eyes: Negative for other worsening pain, redness or other visual disturbance.  Respiratory: Negative for other stridor or swelling Cardiovascular: Negative for other palpitations or other chest pain  Gastrointestinal: Negative for worsening diarrhea or loose stools, blood in stool, distention or other pain Genitourinary: Negative for hematuria, flank pain or other change in urine volume.  Musculoskeletal: Negative for myalgias or other joint swelling.  Skin: Negative for other color change, or other wound or worsening drainage.  Neurological: Negative for other syncope or numbness. Hematological: Negative for other adenopathy or swelling Psychiatric/Behavioral: Negative for hallucinations, other worsening agitation, SI, self-injury, or new  decreased concentration All other system neg per pt    Objective:   Physical Exam BP 124/78   Pulse 89   Ht  (1.753 m)   Wt 166 lb (75.3 kg)   LMP 04/21/2016   SpO2 99%   BMI 24.51 kg/m  VS noted,  Constitutional: Pt is oriented to  person, place, and time. Appears well-developed and well-nourished, in no significant distress and comfortable Head: Normocephalic and atraumatic  Eyes: Conjunctivae and EOM are normal. Pupils are equal, round, and reactive to light Right Ear: External ear normal without discharge Left Ear: External ear normal without discharge Nose: Nose without discharge or deformity Bilat tm's with mild erythema.  Max sinus areas non tender.  Pharynx with mild erythema, no exudate Mouth/Throat: Oropharynx is without other ulcerations and moist  Neck: Normal range of motion. Neck supple. No JVD present. No tracheal deviation present or significant neck LA or mass Cardiovascular: Normal rate, regular rhythm, normal heart sounds and intact distal pulses.   Pulmonary/Chest: WOB normal and breath sounds without rales or wheezing  Abdominal: Soft. Bowel sounds are normal. NT. No HSM  Musculoskeletal: Normal range of motion. Exhibits no edema; spine nontender Lymphadenopathy: Has no other cervical adenopathy.  Neurological: Pt is alert and oriented to person, place, and time. Pt has normal reflexes. No cranial nerve deficit. Motor grossly intact, Gait intact Skin: Skin is warm and dry. No rash noted or new ulcerations Psychiatric:  Has mild nervous mood and affect. Behavior is normal without agitation No other exam findings  Lab Results  Component Value Date   WBC 16.7 (H) 05/11/2015   HGB 13.3 05/11/2015   HCT 39.8 05/11/2015   PLT 244 05/11/2015   GLUCOSE 114 (H) 05/11/2015   ALT 16 08/30/2013   AST 18 08/30/2013   NA 142 05/11/2015   K 3.7 05/11/2015   CL 109 05/11/2015   CREATININE 0.70 05/11/2015   BUN 13 05/11/2015   CO2 22 05/11/2015   TSH 1.65 08/30/2013       Assessment & Plan:

## 2016-05-14 NOTE — Progress Notes (Signed)
Pre visit review using our clinic review tool, if applicable. No additional management support is needed unless otherwise documented below in the visit note. 

## 2016-05-17 DIAGNOSIS — M5412 Radiculopathy, cervical region: Secondary | ICD-10-CM | POA: Insufficient documentation

## 2016-05-17 NOTE — Assessment & Plan Note (Addendum)
Mild symptoms without neuro changes, for trial muscle relaxer and nsaid prn as she declines referral, but to call for ortho referral if not improved

## 2016-05-17 NOTE — Assessment & Plan Note (Signed)
stable overall by history and exam, and pt to continue medical treatment as before,  to f/u any worsening symptoms or concerns 

## 2016-05-17 NOTE — Assessment & Plan Note (Signed)

## 2016-05-17 NOTE — Assessment & Plan Note (Signed)
Stable, for med refills 

## 2016-05-17 NOTE — Assessment & Plan Note (Signed)
Ok for [employer] prn

## 2016-05-19 ENCOUNTER — Telehealth: Payer: Self-pay | Admitting: Internal Medicine

## 2016-05-19 MED ORDER — MELOXICAM 15 MG PO TABS: 15.0000 mg | ORAL_TABLET | Freq: Every day | ORAL | 3 refills | Status: DC

## 2016-05-19 MED ORDER — CYCLOBENZAPRINE HCL 5 MG PO TABS: 5.0000 mg | ORAL_TABLET | Freq: Three times a day (TID) | ORAL | 2 refills | Status: DC | PRN

## 2016-05-19 NOTE — Telephone Encounter (Signed)
Pt saw Dr. Jonny Ruiz on 4/19, in the AVS it states a anti inflammatory and muscle relaxer were prescribed, these were not sent into pharmacy, please send to pharmacy.  [facility] on [address]

## 2016-05-19 NOTE — Telephone Encounter (Signed)
Ok , to be done now

## 2016-07-14 ENCOUNTER — Ambulatory Visit (INDEPENDENT_AMBULATORY_CARE_PROVIDER_SITE_OTHER): Payer: BLUE CROSS/BLUE SHIELD | Admitting: Internal Medicine

## 2016-07-14 ENCOUNTER — Encounter: Payer: Self-pay | Admitting: Internal Medicine

## 2016-07-14 VITALS — BP 110/72 | HR 90 | Ht 69.0 in | Wt 174.0 lb

## 2016-07-14 DIAGNOSIS — M5412 Radiculopathy, cervical region: Secondary | ICD-10-CM

## 2016-07-14 DIAGNOSIS — J019 Acute sinusitis, unspecified: Secondary | ICD-10-CM

## 2016-07-14 DIAGNOSIS — J309 Allergic rhinitis, unspecified: Secondary | ICD-10-CM

## 2016-07-14 MED ORDER — AZITHROMYCIN 250 MG PO TABS: ORAL_TABLET | ORAL | 1 refills | Status: DC

## 2016-07-14 MED ORDER — HYDROCODONE-HOMATROPINE 5-1.5 MG/5ML PO SYRP: 5.0000 mL | ORAL_SOLUTION | Freq: Four times a day (QID) | ORAL | 0 refills | Status: AC | PRN

## 2016-07-14 MED ORDER — ALBUTEROL SULFATE HFA 108 (90 BASE) MCG/ACT IN AERS: 2.0000 | INHALATION_SPRAY | Freq: Four times a day (QID) | RESPIRATORY_TRACT | 2 refills | Status: DC | PRN

## 2016-07-14 NOTE — Progress Notes (Signed)
Subjective:    Patient ID: [name], female    DOB: 11/12/1980, 36 y.o.   MRN: [id number]  HPI   Here with 2-3 days acute onset fever, facial pain, pressure, headache, general weakness and malaise, and greenish d/c, with mild ST and cough, but pt denies chest pain, wheezing, increased sob or doe, orthopnea, PND, increased LE swelling, palpitations, dizziness or syncope. Does have several wks ongoing nasal allergy symptoms with clearish congestion, itch and sneezing, without fever, pain, ST, cough, swelling or wheezing.  Overall not better with mucinex, still feels occas off balance and water noises in the ears. Stil having right neck pain/arm pain with occasional dropping things, Chiropracter only helps each time for 1 day, and is asking for PT eval, does not want to see neurology or other Past Medical History:  Diagnosis Date  . ADD 10/22/2009  . ALLERGIC RHINITIS 09/26/2008  . ANXIETY 09/26/2008  . CONSTIPATION 09/26/2008  . DEPRESSION 09/26/2008  . FATIGUE 01/14/2009  . HYPERLIPIDEMIA 09/26/2008  . OTITIS MEDIA, LEFT 01/14/2009  . SKIN LESION 09/26/2008  . TONSILLITIS, ACUTE 01/14/2009  . URI 10/22/2009   Past Surgical History:  Procedure Laterality Date  . extraction of wisdom teeth     error in charting  . polyp removal       reports that she has been smoking Cigarettes.  She has never used smokeless tobacco. She reports that she drinks alcohol. She reports that she does not use drugs. family history includes Diabetes in her other; Hypertension in her mother and other; Thyroid disease in her mother. No Known Allergies Current Outpatient Prescriptions on File Prior to Visit  Medication Sig Dispense Refill  . amphetamine-dextroamphetamine (ADDERALL XR) 20 MG 24 hr capsule Take 1 capsule (20 mg total) by mouth every morning. To fill July 12, 2016 30 capsule 0  . clonazePAM (KLONOPIN) 0.5 MG tablet TAKE 1 - 2 TABLET BY MOUTH EVERY DAY AS NEEDED FOR ANXIETY 60 tablet 2  . cyclobenzaprine  (FLEXERIL) 5 MG tablet Take 1 tablet (5 mg total) by mouth 3 (three) times daily as needed for muscle spasms. 60 tablet 2  . escitalopram (LEXAPRO) 10 MG tablet TAKE 1 TABLET BY MOUTH EVERY DAY 90 tablet 1  . meloxicam (MOBIC) 15 MG tablet Take 1 tablet (15 mg total) by mouth daily. 30 tablet 3  . OVER THE COUNTER MEDICATION Take 1-2 tablets by mouth daily.    Marland Kitchen. VIORELE 0.15-0.02/0.01 MG (21/5) tablet Take 1 tablet by mouth daily.  11   No current facility-administered medications on file prior to visit.    Review of Systems  Constitutional: Negative for other unusual diaphoresis or sweats HENT: Negative for ear discharge or swelling Eyes: Negative for other worsening visual disturbances Respiratory: Negative for stridor or other swelling  Gastrointestinal: Negative for worsening distension or other blood Genitourinary: Negative for retention or other urinary change Musculoskeletal: Negative for other MSK pain or swelling Skin: Negative for color change or other new lesions Neurological: Negative for worsening tremors and other numbness  Psychiatric/Behavioral: Negative for worsening agitation or other fatigue All other system neg per pt    Objective:   Physical Exam BP 110/72   Pulse 90   Ht 5\' 9"  (1.753 m)   Wt 174 lb (78.9 kg)   SpO2 98%   BMI 25.70 kg/m  VS noted, mild ill Constitutional: Pt appears in NAD HENT: Head: NCAT.  Right Ear: External ear normal.  Left Ear: External ear normal.  Eyes: .  Pupils are equal, round, and reactive to light. Conjunctivae and EOM are normal Spine: nontender Nose: without d/c or deformity Neck: Neck supple. Gross normal ROM Cardiovascular: Normal rate and regular rhythm.   Pulmonary/Chest: Effort normal and breath sounds without rales or wheezing.  Abd:  Soft, NT, ND, + BS, no organomegaly Neurological: Pt is alert. At baseline orientation, motor grossly intact Skin: Skin is warm. No rashes, other new lesions, no LE edema Psychiatric:  Pt behavior is normal without agitation  No other exam findings    Assessment & Plan:

## 2016-07-14 NOTE — Assessment & Plan Note (Signed)
Mild to mod, for antibx course,  to f/u any worsening symptoms or concerns 

## 2016-07-14 NOTE — Assessment & Plan Note (Signed)
Mild recurrent, declines med tx except for PT eval,  to f/u any worsening symptoms or concerns

## 2016-07-14 NOTE — Patient Instructions (Signed)
Please take all new medication as prescribed - the antibiotic, cough medicine, and inhaler if needed  You will be contacted regarding the referral for: Physical therapy  Please continue all other medications as before, and refills have been done if requested.  Please have the pharmacy call with any other refills you may need.  Please keep your appointments with your specialists as you may have planned

## 2016-07-14 NOTE — Assessment & Plan Note (Signed)
Mild to mod, for OTC zyrtec and albut MDi prn as this helped in past with lying down,  to f/u any worsening symptoms or concerns

## 2016-08-04 DIAGNOSIS — N760 Acute vaginitis: Secondary | ICD-10-CM | POA: Diagnosis not present

## 2016-08-04 DIAGNOSIS — N898 Other specified noninflammatory disorders of vagina: Secondary | ICD-10-CM | POA: Diagnosis not present

## 2016-08-04 DIAGNOSIS — R350 Frequency of micturition: Secondary | ICD-10-CM | POA: Diagnosis not present

## 2016-08-04 DIAGNOSIS — N939 Abnormal uterine and vaginal bleeding, unspecified: Secondary | ICD-10-CM | POA: Diagnosis not present

## 2016-08-14 ENCOUNTER — Other Ambulatory Visit: Payer: Self-pay | Admitting: Internal Medicine

## 2016-08-26 ENCOUNTER — Telehealth: Payer: Self-pay | Admitting: Internal Medicine

## 2016-08-26 MED ORDER — AMPHETAMINE-DEXTROAMPHET ER 20 MG PO CP24: 20.0000 mg | ORAL_CAPSULE | ORAL | 0 refills | Status: DC

## 2016-08-26 NOTE — Telephone Encounter (Signed)
Check [place] registry last filled 07/26/2016 @ [facility] pls advise...Raechel Chute/LMB

## 2016-08-26 NOTE — Telephone Encounter (Signed)
Done hardcopy to Shirron  

## 2016-08-26 NOTE — Telephone Encounter (Signed)
Called pt, LVM informing. Script at front desk.

## 2016-08-26 NOTE — Telephone Encounter (Signed)
Pt called in, needs refill amphetamine-dextroamphetamine (ADDERALL XR) 20 MG 24 hr capsule [[id number]][[id number]]    Please call when ready for pick up Last seen 07/14/2016

## 2016-09-24 ENCOUNTER — Telehealth: Payer: Self-pay | Admitting: Internal Medicine

## 2016-09-24 MED ORDER — AMPHETAMINE-DEXTROAMPHET ER 20 MG PO CP24: 20.0000 mg | ORAL_CAPSULE | ORAL | 0 refills | Status: DC

## 2016-09-24 NOTE — Telephone Encounter (Signed)
Informed pt Script at front desk  

## 2016-09-24 NOTE — Telephone Encounter (Signed)
Done hardcopy to Shirron  

## 2016-09-24 NOTE — Telephone Encounter (Signed)
Would like to know if she can pick script for adderall up before the weekend.

## 2016-10-01 ENCOUNTER — Other Ambulatory Visit: Payer: Self-pay | Admitting: Internal Medicine

## 2016-10-05 ENCOUNTER — Encounter: Payer: Self-pay | Admitting: Internal Medicine

## 2016-10-13 ENCOUNTER — Encounter: Payer: Self-pay | Admitting: Internal Medicine

## 2016-10-13 ENCOUNTER — Ambulatory Visit (INDEPENDENT_AMBULATORY_CARE_PROVIDER_SITE_OTHER): Payer: BLUE CROSS/BLUE SHIELD | Admitting: Internal Medicine

## 2016-10-13 VITALS — BP 114/76 | HR 87 | Temp 98.6°F | Ht 69.0 in | Wt 193.0 lb

## 2016-10-13 DIAGNOSIS — F411 Generalized anxiety disorder: Secondary | ICD-10-CM | POA: Diagnosis not present

## 2016-10-13 DIAGNOSIS — J019 Acute sinusitis, unspecified: Secondary | ICD-10-CM

## 2016-10-13 DIAGNOSIS — F988 Other specified behavioral and emotional disorders with onset usually occurring in childhood and adolescence: Secondary | ICD-10-CM | POA: Diagnosis not present

## 2016-10-13 MED ORDER — AMPHETAMINE-DEXTROAMPHET ER 20 MG PO CP24: 20.0000 mg | ORAL_CAPSULE | ORAL | 0 refills | Status: DC

## 2016-10-13 MED ORDER — CLONAZEPAM 0.5 MG PO TABS: ORAL_TABLET | ORAL | 5 refills | Status: DC

## 2016-10-13 MED ORDER — AMOXICILLIN 500 MG PO CAPS: 500.0000 mg | ORAL_CAPSULE | Freq: Three times a day (TID) | ORAL | 0 refills | Status: DC

## 2016-10-13 NOTE — Progress Notes (Signed)
Subjective:    Patient ID: [name], female    DOB: 1980/08/18, 36 y.o.   MRN: [id number]  HPI   Here with 2-3 days acute onset fever, facial pain, pressure, headache, general weakness and malaise, and greenish d/c, with mild ST and cough, but pt denies chest pain, wheezing, increased sob or doe, orthopnea, PND, increased LE swelling, palpitations, dizziness or syncope.  Denies worsening depressive symptoms, suicidal ideation, or panic; has ongoing anxiety, not increased recently.   ADD med working well, able to function well at work, needs refills. Past Medical History:  Diagnosis Date  . ADD 10/22/2009  . ALLERGIC RHINITIS 09/26/2008  . ANXIETY 09/26/2008  . CONSTIPATION 09/26/2008  . DEPRESSION 09/26/2008  . FATIGUE 01/14/2009  . HYPERLIPIDEMIA 09/26/2008  . OTITIS MEDIA, LEFT 01/14/2009  . SKIN LESION 09/26/2008  . TONSILLITIS, ACUTE 01/14/2009  . URI 10/22/2009   Past Surgical History:  Procedure Laterality Date  . extraction of wisdom teeth     error in charting  . polyp removal       reports that she has been smoking Cigarettes.  She has never used smokeless tobacco. She reports that she drinks alcohol. She reports that she does not use drugs. family history includes Diabetes in her other; Hypertension in her mother and other; Thyroid disease in her mother. No Known Allergies Current Outpatient Prescriptions on File Prior to Visit  Medication Sig Dispense Refill  . albuterol (PROVENTIL HFA;VENTOLIN HFA) 108 (90 Base) MCG/ACT inhaler Inhale 2 puffs into the lungs every 6 (six) hours as needed for wheezing or shortness of breath. 1 Inhaler 2  . cyclobenzaprine (FLEXERIL) 5 MG tablet TAKE 1 TABLET BY MOUTH 3 TIMES A DAY AS NEEDED FOR MUSCLE SPASMS 60 tablet 2  . escitalopram (LEXAPRO) 10 MG tablet TAKE 1 TABLET BY MOUTH EVERY DAY 90 tablet 1  . meloxicam (MOBIC) 15 MG tablet TAKE 1 TABLET BY MOUTH EVERY DAY 30 tablet 3  . OVER THE COUNTER MEDICATION Take 1-2 tablets by mouth daily.      Marland Kitchen VIORELE 0.15-0.02/0.01 MG (21/5) tablet Take 1 tablet by mouth daily.  11   No current facility-administered medications on file prior to visit.    Review of Systems  Constitutional: Negative for other unusual diaphoresis or sweats HENT: Negative for ear discharge or swelling Eyes: Negative for other worsening visual disturbances Respiratory: Negative for stridor or other swelling  Gastrointestinal: Negative for worsening distension or other blood Genitourinary: Negative for retention or other urinary change Musculoskeletal: Negative for other MSK pain or swelling Skin: Negative for color change or other new lesions Neurological: Negative for worsening tremors and other numbness  Psychiatric/Behavioral: Negative for worsening agitation or other fatigue All other system neg per pt    Objective:   Physical Exam BP 114/76   Pulse 87   Temp 98.6 F (37 C) (Oral)   Ht  (1.753 m)   Wt 193 lb (87.5 kg)   SpO2 99%   BMI 28.50 kg/m  VS noted, mild ill Constitutional: Pt appears in NAD HENT: Head: NCAT.  Right Ear: External ear normal.  Left Ear: External ear normal.  Eyes: . Pupils are equal, round, and reactive to light. Conjunctivae and EOM are normal Nose: without d/c or deformity Bilat tm's with mild erythema.  Max sinus areas mild tender.  Pharynx with mild erythema, no exudate Neck: Neck supple. Gross normal ROM Cardiovascular: Normal rate and regular rhythm.   Pulmonary/Chest: Effort normal and breath sounds  without rales or wheezing.  Neurological: Pt is alert. At baseline orientation, motor grossly intact Skin: Skin is warm. No rashes, other new lesions, no LE edema Psychiatric: Pt behavior is normal without agitation , mild nervous, not depressed affect No other exam findings    Assessment & Plan:

## 2016-10-13 NOTE — Patient Instructions (Signed)
Please take all new medication as prescribed  You can also take Delsym OTC for cough, and/or Mucinex D or Claritin D (or it's generic off brand) for congestion, and tylenol as needed for pain.  Please continue all other medications as before, and refills have been done if requested - the klnopin, and adderall  Please have the pharmacy call with any other refills you may need.  Please keep your appointments with your specialists as you may have planned

## 2016-10-15 NOTE — Assessment & Plan Note (Signed)
stable overall by history and exam,  and pt to continue medical treatment as before, refills prn,  to f/u any worsening symptoms or concerns

## 2016-10-15 NOTE — Assessment & Plan Note (Signed)
Mild to mod, for antibx course,  to f/u any worsening symptoms or concerns 

## 2016-10-15 NOTE — Assessment & Plan Note (Signed)
stable overall by history and exam, for med refills, and pt to continue medical treatment as before,  to f/u any worsening symptoms or concerns

## 2016-10-27 ENCOUNTER — Telehealth: Payer: Self-pay | Admitting: Internal Medicine

## 2016-10-27 DIAGNOSIS — M542 Cervicalgia: Secondary | ICD-10-CM

## 2016-10-27 DIAGNOSIS — M79601 Pain in right arm: Secondary | ICD-10-CM

## 2016-10-27 NOTE — Telephone Encounter (Signed)
Ok, this is done 

## 2016-10-27 NOTE — Telephone Encounter (Signed)
Pt called stating that she called to schedule her PT appointment but the original referral has expired and they need a new referral sent over so that she can schedule. Can this be re-entered?

## 2016-10-29 ENCOUNTER — Other Ambulatory Visit: Payer: Self-pay | Admitting: Internal Medicine

## 2016-10-29 NOTE — Telephone Encounter (Signed)
Done erx 

## 2016-11-05 ENCOUNTER — Ambulatory Visit: Payer: BLUE CROSS/BLUE SHIELD

## 2016-11-08 ENCOUNTER — Other Ambulatory Visit: Payer: Self-pay | Admitting: Internal Medicine

## 2016-11-09 NOTE — Telephone Encounter (Signed)
lexapro done erx 

## 2016-11-12 ENCOUNTER — Ambulatory Visit: Payer: BLUE CROSS/BLUE SHIELD | Attending: Internal Medicine

## 2016-11-12 DIAGNOSIS — M542 Cervicalgia: Secondary | ICD-10-CM | POA: Diagnosis not present

## 2016-11-12 DIAGNOSIS — R293 Abnormal posture: Secondary | ICD-10-CM | POA: Diagnosis not present

## 2016-11-12 DIAGNOSIS — R252 Cramp and spasm: Secondary | ICD-10-CM | POA: Diagnosis not present

## 2016-11-12 NOTE — Patient Instructions (Signed)
Issued from cabinet , dry needle info, cervical and scapula retraction and lateral neck stretches   daily 2-3 x 23- reps
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
## 2016-11-23 ENCOUNTER — Ambulatory Visit: Payer: BLUE CROSS/BLUE SHIELD

## 2016-11-25 ENCOUNTER — Telehealth: Payer: Self-pay | Admitting: Physical Therapy

## 2016-11-25 ENCOUNTER — Ambulatory Visit: Payer: BLUE CROSS/BLUE SHIELD | Admitting: Physical Therapy

## 2016-11-25 NOTE — Telephone Encounter (Signed)
Attempted to call pt regarding missed appointment, but pt's mailbox is full.

## 2016-11-30 ENCOUNTER — Ambulatory Visit: Payer: BLUE CROSS/BLUE SHIELD | Attending: Internal Medicine

## 2016-12-02 ENCOUNTER — Encounter: Payer: Self-pay | Admitting: Physical Therapy

## 2016-12-09 ENCOUNTER — Encounter: Payer: Self-pay | Admitting: Physical Therapy

## 2017-01-08 ENCOUNTER — Ambulatory Visit: Payer: Self-pay | Admitting: Internal Medicine

## 2017-01-13 ENCOUNTER — Ambulatory Visit: Payer: BLUE CROSS/BLUE SHIELD | Admitting: Family Medicine

## 2017-01-13 ENCOUNTER — Ambulatory Visit: Payer: Self-pay | Admitting: Family Medicine

## 2017-01-13 ENCOUNTER — Ambulatory Visit: Payer: Self-pay | Admitting: *Deleted

## 2017-01-13 NOTE — Telephone Encounter (Signed)
  Pt had one episode of dizziness this am. Reports has UTI symptoms, has had in past. Pt instructed to increase fluid intake, slow positional changes. Has appt at 2pm today; does have someone to drive her. Answer Assessment - Initial Assessment Questions 1. DESCRIPTION: "Describe your dizziness."     Standing, walking around 2. LIGHTHEADED: "Do you feel lightheaded?" (e.g., somewhat faint, woozy, weak upon standing)     yes 3. VERTIGO: "Do you feel like either you or the room is spinning or tilting?" (i.e. vertigo)      4. SEVERITY: "How bad is it?"  "Do you feel like you are going to faint?" "Can you stand and walk?"   - MILD - walking normally   - MODERATE - interferes with normal activities (e.g., work, school)    - SEVERE - unable to stand, requires support to walk, feels like passing out now.      *No Answer* 5. ONSET:  "When did the dizziness begin?"     *No Answer* 6. AGGRAVATING FACTORS: "Does anything make it worse?" (e.g., standing, change in head position)     *No Answer* 7. HEART RATE: "Can you tell me your heart rate?" "How many beats in 15 seconds?"  (Note: not all patients can do this)       *No Answer* 8. CAUSE: "What do you think is causing the dizziness?"     *No Answer* 9. RECURRENT SYMPTOM: "Have you had dizziness before?" If so, ask: "When was the last time?" "What happened that time?"     *No Answer* 10. OTHER SYMPTOMS: "Do you have any other symptoms?" (e.g., fever, chest pain, vomiting, diarrhea, bleeding)       no 11. PREGNANCY: "Is there any chance you are pregnant?" "When was your last menstrual period?"       Around 11/19  Protocols used: DIZZINESS [facility]- LIGHTHEADEDNESS-A-AH

## 2017-01-14 ENCOUNTER — Other Ambulatory Visit (INDEPENDENT_AMBULATORY_CARE_PROVIDER_SITE_OTHER): Payer: BLUE CROSS/BLUE SHIELD

## 2017-01-14 ENCOUNTER — Ambulatory Visit: Payer: BLUE CROSS/BLUE SHIELD | Admitting: Internal Medicine

## 2017-01-14 ENCOUNTER — Encounter: Payer: Self-pay | Admitting: Internal Medicine

## 2017-01-14 VITALS — BP 120/76 | HR 86 | Temp 98.4°F | Ht 69.0 in | Wt 204.0 lb

## 2017-01-14 DIAGNOSIS — M545 Low back pain, unspecified: Secondary | ICD-10-CM

## 2017-01-14 DIAGNOSIS — R300 Dysuria: Secondary | ICD-10-CM

## 2017-01-14 DIAGNOSIS — F988 Other specified behavioral and emotional disorders with onset usually occurring in childhood and adolescence: Secondary | ICD-10-CM | POA: Diagnosis not present

## 2017-01-14 LAB — URINALYSIS, ROUTINE W REFLEX MICROSCOPIC
BILIRUBIN URINE: NEGATIVE
HGB URINE DIPSTICK: NEGATIVE
Ketones, ur: NEGATIVE
Leukocytes, UA: NEGATIVE
NITRITE: NEGATIVE
PH: 6.5 (ref 5.0–8.0)
Specific Gravity, Urine: 1.015 (ref 1.000–1.030)
TOTAL PROTEIN, URINE-UPE24: NEGATIVE
URINE GLUCOSE: NEGATIVE
UROBILINOGEN UA: 0.2 (ref 0.0–1.0)

## 2017-01-14 MED ORDER — CEPHALEXIN 500 MG PO CAPS: 500.0000 mg | ORAL_CAPSULE | Freq: Three times a day (TID) | ORAL | 0 refills | Status: AC

## 2017-01-14 MED ORDER — AMPHETAMINE-DEXTROAMPHET ER 20 MG PO CP24: 20.0000 mg | ORAL_CAPSULE | ORAL | 0 refills | Status: DC

## 2017-01-14 NOTE — Progress Notes (Signed)
Subjective:    Patient ID: [name], female    DOB: 03/31/1980, 36 y.o.   MRN: [id number]  HPI  Here to f/u with 2 wks onset lower back pain with some tenderness to the lowest lumbar and even sacral area, mild to mod, intermittent, aching and sharp, worse to bend or twist, seemed to begin after heavy lifting, assoc with transient bilat knee pain and swelling (better now); Also c/o dysuria and freq and urgency but Denies urinary symptoms such as flank pain, hematuria or n/v, fever, chills. No fever or blood, but has odd smell, fatigued and mild dizzy for several days.  Has been drinking copious water and cranberry but not better.  Believes her back pain may be due to cystitis. Also, ADD med working well, cont;s to remain employed and fair to good concentration and task completion.   Past Medical History:  Diagnosis Date  . ADD 10/22/2009  . ALLERGIC RHINITIS 09/26/2008  . ANXIETY 09/26/2008  . CONSTIPATION 09/26/2008  . DEPRESSION 09/26/2008  . FATIGUE 01/14/2009  . HYPERLIPIDEMIA 09/26/2008  . OTITIS MEDIA, LEFT 01/14/2009  . SKIN LESION 09/26/2008  . TONSILLITIS, ACUTE 01/14/2009  . URI 10/22/2009   Past Surgical History:  Procedure Laterality Date  . extraction of wisdom teeth     error in charting  . polyp removal       reports that she has been smoking cigarettes.  she has never used smokeless tobacco. She reports that she drinks alcohol. She reports that she does not use drugs. family history includes Diabetes in her other; Hypertension in her mother and other; Thyroid disease in her mother. No Known Allergies Current Outpatient Medications on File Prior to Visit  Medication Sig Dispense Refill  . albuterol (PROVENTIL HFA;VENTOLIN HFA) 108 (90 Base) MCG/ACT inhaler Inhale 2 puffs into the lungs every 6 (six) hours as needed for wheezing or shortness of breath. 1 Inhaler 2  . clonazePAM (KLONOPIN) 0.5 MG tablet TAKE 1 - 2 TABLET BY MOUTH EVERY DAY AS NEEDED FOR ANXIETY 60 tablet 5  .  cyclobenzaprine (FLEXERIL) 5 MG tablet TAKE 1 TABLET BY MOUTH 3 TIMES A DAY AS NEEDED FOR MUSCLE SPASMS 60 tablet 2  . escitalopram (LEXAPRO) 10 MG tablet TAKE 1 TABLET BY MOUTH EVERY DAY 90 tablet 1  . meloxicam (MOBIC) 15 MG tablet TAKE 1 TABLET BY MOUTH EVERY DAY 30 tablet 3  . OVER THE COUNTER MEDICATION Take 1-2 tablets by mouth daily.    Marland Kitchen. VIORELE 0.15-0.02/0.01 MG (21/5) tablet Take 1 tablet by mouth daily.  11   No current facility-administered medications on file prior to visit.    Review of Systems  Constitutional: Negative for other unusual diaphoresis or sweats HENT: Negative for ear discharge or swelling Eyes: Negative for other worsening visual disturbances Respiratory: Negative for stridor or other swelling  Gastrointestinal: Negative for worsening distension or other blood Genitourinary: Negative for retention or other urinary change Musculoskeletal: Negative for other MSK pain or swelling Skin: Negative for color change or other new lesions Neurological: Negative for worsening tremors and other numbness  Psychiatric/Behavioral: Negative for worsening agitation or other fatigue All other system neg per pt    Objective:   Physical Exam BP 120/76   Pulse 86   Temp 98.4 F (36.9 C) (Oral)   Ht 5\' 9"  (1.753 m)   Wt 204 lb (92.5 kg)   SpO2 99%   BMI 30.13 kg/m  VS noted,  Constitutional: Pt appears in NAD HENT:  Head: NCAT.  Right Ear: External ear normal.  Left Ear: External ear normal.  Eyes: . Pupils are equal, round, and reactive to light. Conjunctivae and EOM are normal Nose: without d/c or deformity Neck: Neck supple. Gross normal ROM Cardiovascular: Normal rate and regular rhythm.   Pulmonary/Chest: Effort normal and breath sounds without rales or wheezing.  Abd:  Soft, with mild tender low mid abd without guarding or rebound, ND, + BS, no organomegaly Spine  nontender throughout except low mid lumbar and sacral areas, without swelling or  rash Neurological: Pt is alert. At baseline orientation, motor grossly intact Skin: Skin is warm. No rashes, other new lesions, no LE edema Psychiatric: Pt behavior is normal without agitation  No other exam findings Lab Results  Component Value Date   WBC 16.7 (H) 05/11/2015   HGB 13.3 05/11/2015   HCT 39.8 05/11/2015   PLT 244 05/11/2015   GLUCOSE 114 (H) 05/11/2015   ALT 16 08/30/2013   AST 18 08/30/2013   NA 142 05/11/2015   K 3.7 05/11/2015   CL 109 05/11/2015   CREATININE 0.70 05/11/2015   BUN 13 05/11/2015   CO2 22 05/11/2015   TSH 1.65 08/30/2013       Assessment & Plan:

## 2017-01-14 NOTE — Assessment & Plan Note (Signed)
Stable, to cont current tx,. to f/u any worsening symptoms or concerns

## 2017-01-14 NOTE — Assessment & Plan Note (Signed)
I suspect separate issue from dysuria, likely MSK strain from lifting, for restart mobic prn, consider films if not improved to r/o other such as ankylosing spondylitis or other

## 2017-01-14 NOTE — Patient Instructions (Signed)
Please take all new medication as prescribed - the antibiotic  Please continue all other medications as before, including the mobic for pain, and the Adderrall  Please call for xray of the back if not better in 3-5 days  Please have the pharmacy call with any other refills you may need.  Please continue your efforts at being more active, low cholesterol diet, and weight control.  Please keep your appointments with your specialists as you may have planned

## 2017-01-14 NOTE — Assessment & Plan Note (Signed)
By hx and exam seems c/w cystitis, for urine studies, empiric cephalaxin,  to f/u any worsening symptoms or concerns

## 2017-01-15 LAB — URINE CULTURE
MICRO NUMBER: 81433950
Result:: NO GROWTH
SPECIMEN QUALITY: ADEQUATE

## 2017-01-26 HISTORY — PX: CERVICAL POLYPECTOMY: SHX88

## 2017-01-27 ENCOUNTER — Telehealth: Payer: Self-pay | Admitting: Internal Medicine

## 2017-01-27 NOTE — Telephone Encounter (Signed)
Copied from CRM [phone]. Topic: Quick Communication - Lab Results >> Jan 27, 2017  3:34 PM Laural BenesJohnson, Louisianahiquita C wrote: Pt called in to request her lab results.    Please advise.

## 2017-01-27 NOTE — Telephone Encounter (Signed)
I think she means the UA and culture - both negative for infection  I suspect her pain at the last visit was more related to a lower back issue, such as lumbar arthritis or degenerative disc or muscle pull

## 2017-01-28 NOTE — Telephone Encounter (Signed)
Her adderall was just refilled dec 20;  I suspect she has a rx at her pharmacy that she is unaware  OK to inform pt

## 2017-01-28 NOTE — Telephone Encounter (Signed)
Pt has been informed and expressed understanding. She mentioned that she needed a refill on Adderall.

## 2017-01-29 ENCOUNTER — Other Ambulatory Visit: Payer: Self-pay | Admitting: Internal Medicine

## 2017-01-29 NOTE — Telephone Encounter (Signed)
This would be too early, since the last rx was sent dec 20

## 2017-01-29 NOTE — Telephone Encounter (Signed)
Okay to fill Adderall

## 2017-01-29 NOTE — Telephone Encounter (Signed)
Copied from CRM [phone]#[id number]. Topic: Quick Communication - Rx Refill/Question >> Jan 29, 2017  1:13 PM Gerrianne ScalePayne, Lauriel Helin L wrote: Has the patient contacted their pharmacy? No.   (Agent: If no, request that the patient contact the pharmacy for the refill.)amphetamine-dextroamphetamine (ADDERALL XR) 20 MG 24 hr capsule   Preferred Pharmacy (with phone number or street name): [id number] [place]- [place], [place] - [address] [phone] (Phone) [phone])     Agent: Please be advised that RX refills may take up to 3 business days. We ask that you follow-up with your pharmacy.

## 2017-02-08 MED ORDER — AMPHETAMINE-DEXTROAMPHET ER 20 MG PO CP24: 20.0000 mg | ORAL_CAPSULE | ORAL | 0 refills | Status: DC

## 2017-02-08 NOTE — Addendum Note (Signed)
Addended by: Corwin LevinsJOHN, Ziyan Hillmer W on: 02/08/2017 12:57 PM   Modules accepted: Orders

## 2017-02-08 NOTE — Telephone Encounter (Signed)
°  Relation to pt: self  Call back number: [phone]  [facility] [address], [place], [place] [address] [phone]  Reason for call:   Patient states she would like amphetamine-dextroamphetamine (ADDERALL XR) 20 MG 24 hr capsule [facility],  [facility] [address], [place], [place] [address] [phone].   Patient states she was notified by the pharmacy due to medication being a controlled substance, patient would have to call PCP requesting refill. Patient in need of clarity regarding frequency stating PCP increase to 2x daily, please advise

## 2017-02-08 NOTE — Telephone Encounter (Signed)
[facility]  - done erx  Bid dosing may have been mentioned at her last visit, but I think it was my impression she would stay on 1 per day, as we do not normally change rx for controlled substances between visits due to the sensitive nature of the medication

## 2017-03-04 ENCOUNTER — Telehealth: Payer: Self-pay | Admitting: Internal Medicine

## 2017-03-04 NOTE — Telephone Encounter (Signed)
Copied from CRM [phone]. Topic: Quick Communication - Rx Refill/Question >> Mar 04, 2017  9:49 AM Floria RavelingStovall, Shana A wrote: Medication: amphetamine-dextroamphetamine (ADDERALL XR) 20 MG 24 hr capsule [[id number]][[id number]] .  She stated that this is suppose to upped to 2  20mg  tabs daily    Has the patient contacted their pharmacy? no   (Agent: If no, request that the patient contact the pharmacy for the refill.)   Preferred Pharmacy (with phone number or street name):  [facility] [address] -    Agent: Please be advised that RX refills may take up to 3 business days. We ask that you follow-up with your pharmacy.

## 2017-03-04 NOTE — Telephone Encounter (Signed)
Medication management:  Patient is stating there was supposed to be a change in her medication dosage- in review- can not find it reflected in chart. Patient states Adderall XR 20 mg is supposed to be 2 tablets daily.  LOV: 01/14/17

## 2017-03-04 NOTE — Telephone Encounter (Signed)
Called pt, VM is full, can not leave a msg.

## 2017-03-04 NOTE — Telephone Encounter (Signed)
This was probably discussed at her last visit, but as she was doing ok, I felt she should stay on once per day  This can be further discussed at next visit if she wants

## 2017-03-09 ENCOUNTER — Encounter: Payer: Self-pay | Admitting: Internal Medicine

## 2017-03-09 NOTE — Telephone Encounter (Addendum)
Spoke with CMA, The medication is not due for a refill until 2/14, it will be refilled then when she comes into her appointment.

## 2017-03-09 NOTE — Telephone Encounter (Signed)
Pt calling for a f/u, she states she is fine for now taking the 20mg  once a day.  F/u with pt

## 2017-03-11 ENCOUNTER — Encounter: Payer: Self-pay | Admitting: Internal Medicine

## 2017-03-11 ENCOUNTER — Other Ambulatory Visit (INDEPENDENT_AMBULATORY_CARE_PROVIDER_SITE_OTHER): Payer: BLUE CROSS/BLUE SHIELD

## 2017-03-11 ENCOUNTER — Ambulatory Visit: Payer: BLUE CROSS/BLUE SHIELD | Admitting: Internal Medicine

## 2017-03-11 VITALS — BP 116/78 | HR 88 | Temp 98.2°F | Ht 69.0 in | Wt 219.0 lb

## 2017-03-11 DIAGNOSIS — Z0000 Encounter for general adult medical examination without abnormal findings: Secondary | ICD-10-CM | POA: Diagnosis not present

## 2017-03-11 DIAGNOSIS — R739 Hyperglycemia, unspecified: Secondary | ICD-10-CM | POA: Diagnosis not present

## 2017-03-11 DIAGNOSIS — F988 Other specified behavioral and emotional disorders with onset usually occurring in childhood and adolescence: Secondary | ICD-10-CM

## 2017-03-11 DIAGNOSIS — R7303 Prediabetes: Secondary | ICD-10-CM | POA: Insufficient documentation

## 2017-03-11 LAB — HEPATIC FUNCTION PANEL
ALT: 19 U/L (ref 0–35)
AST: 19 U/L (ref 0–37)
Albumin: 4.2 g/dL (ref 3.5–5.2)
Alkaline Phosphatase: 72 U/L (ref 39–117)
BILIRUBIN TOTAL: 0.2 mg/dL (ref 0.2–1.2)
Bilirubin, Direct: 0 mg/dL (ref 0.0–0.3)
Total Protein: 7 g/dL (ref 6.0–8.3)

## 2017-03-11 LAB — CBC WITH DIFFERENTIAL/PLATELET
BASOS PCT: 1.3 % (ref 0.0–3.0)
Basophils Absolute: 0.1 10*3/uL (ref 0.0–0.1)
EOS ABS: 0.1 10*3/uL (ref 0.0–0.7)
EOS PCT: 1 % (ref 0.0–5.0)
HCT: 41 % (ref 36.0–46.0)
Hemoglobin: 13.6 g/dL (ref 12.0–15.0)
LYMPHS ABS: 2.8 10*3/uL (ref 0.7–4.0)
Lymphocytes Relative: 28.8 % (ref 12.0–46.0)
MCHC: 33.1 g/dL (ref 30.0–36.0)
MCV: 91.7 fl (ref 78.0–100.0)
MONO ABS: 0.7 10*3/uL (ref 0.1–1.0)
Monocytes Relative: 7 % (ref 3.0–12.0)
NEUTROS PCT: 61.9 % (ref 43.0–77.0)
Neutro Abs: 6 10*3/uL (ref 1.4–7.7)
Platelets: 267 10*3/uL (ref 150.0–400.0)
RBC: 4.48 Mil/uL (ref 3.87–5.11)
RDW: 12.9 % (ref 11.5–15.5)
WBC: 9.7 10*3/uL (ref 4.0–10.5)

## 2017-03-11 LAB — TSH: TSH: 2.6 u[IU]/mL (ref 0.35–4.50)

## 2017-03-11 LAB — BASIC METABOLIC PANEL
BUN: 13 mg/dL (ref 6–23)
CHLORIDE: 103 meq/L (ref 96–112)
CO2: 28 meq/L (ref 19–32)
Calcium: 9.1 mg/dL (ref 8.4–10.5)
Creatinine, Ser: 0.64 mg/dL (ref 0.40–1.20)
GFR: 112.27 mL/min (ref >60.00)
GLUCOSE: 91 mg/dL (ref 70–99)
POTASSIUM: 4.3 meq/L (ref 3.5–5.1)
SODIUM: 138 meq/L (ref 135–145)

## 2017-03-11 LAB — URINALYSIS, ROUTINE W REFLEX MICROSCOPIC
BILIRUBIN URINE: NEGATIVE
Hgb urine dipstick: NEGATIVE
KETONES UR: NEGATIVE
LEUKOCYTES UA: NEGATIVE
NITRITE: NEGATIVE
RBC / HPF: NONE SEEN (ref 0–?)
SPECIFIC GRAVITY, URINE: 1.025 (ref 1.000–1.030)
Total Protein, Urine: NEGATIVE
URINE GLUCOSE: NEGATIVE
UROBILINOGEN UA: 0.2 (ref 0.0–1.0)
pH: 6.5 (ref 5.0–8.0)

## 2017-03-11 LAB — LIPID PANEL
CHOL/HDL RATIO: 3
Cholesterol: 198 mg/dL (ref 0–200)
HDL: 75.6 mg/dL (ref >39.00)
LDL CALC: 104 mg/dL — AB (ref 0–99)
NONHDL: 122.43
Triglycerides: 93 mg/dL (ref 0.0–149.0)
VLDL: 18.6 mg/dL (ref 0.0–40.0)

## 2017-03-11 LAB — HEMOGLOBIN A1C: Hgb A1c MFr Bld: 5.2 % (ref 4.6–6.5)

## 2017-03-11 MED ORDER — AMPHETAMINE-DEXTROAMPHET ER 20 MG PO CP24: 20.0000 mg | ORAL_CAPSULE | ORAL | 0 refills | Status: DC

## 2017-03-11 MED ORDER — CLONAZEPAM 0.5 MG PO TABS: ORAL_TABLET | ORAL | 5 refills | Status: DC

## 2017-03-11 MED ORDER — AMPHETAMINE-DEXTROAMPHETAMINE 10 MG PO TABS: ORAL_TABLET | ORAL | 0 refills | Status: DC

## 2017-03-11 NOTE — Progress Notes (Signed)
Subjective:    Patient ID: [name], female    DOB: 1980/11/06, 37 y.o.   MRN: [id number]  HPI   Here for wellness and f/u;  Overall doing ok;  Pt denies Chest pain, worsening SOB, DOE, wheezing, orthopnea, PND, worsening LE edema, palpitations, dizziness or syncope.  Pt denies neurological change such as new headache, facial or extremity weakness.  Pt denies polydipsia, polyuria, or low sugar symptoms. Pt states overall good compliance with treatment and medications, good tolerability, and has been trying to follow appropriate diet.  Pt denies worsening depressive symptoms, suicidal ideation or panic. No fever, night sweats, wt loss, loss of appetite, or other constitutional symptoms.  Pt states good ability with ADL's, has low fall risk, home safety reviewed and adequate, no other significant changes in hearing or vision, and only occasionally active with exercise.  Peak wt has been 315 in 2009, still working on wt loss, but now starting to regain Wt Readings from Last 3 Encounters:  03/11/17 219 lb (99.3 kg)  01/14/17 204 lb (92.5 kg)  10/13/16 193 lb (87.5 kg)  Adderal 20 ER qam not helping later in the day with lack of concentration and focus, not getting work done Past Medical History:  Diagnosis Date  . ADD 10/22/2009  . ALLERGIC RHINITIS 09/26/2008  . ANXIETY 09/26/2008  . CONSTIPATION 09/26/2008  . DEPRESSION 09/26/2008  . FATIGUE 01/14/2009  . HYPERLIPIDEMIA 09/26/2008  . OTITIS MEDIA, LEFT 01/14/2009  . SKIN LESION 09/26/2008  . TONSILLITIS, ACUTE 01/14/2009  . URI 10/22/2009   Past Surgical History:  Procedure Laterality Date  . extraction of wisdom teeth     error in charting  . polyp removal       reports that she has been smoking cigarettes.  she has never used smokeless tobacco. She reports that she drinks alcohol. She reports that she does not use drugs. family history includes Diabetes in her other; Hypertension in her mother and other; Thyroid disease in her mother. No  Known Allergies Current Outpatient Medications on File Prior to Visit  Medication Sig Dispense Refill  . albuterol (PROVENTIL HFA;VENTOLIN HFA) 108 (90 Base) MCG/ACT inhaler Inhale 2 puffs into the lungs every 6 (six) hours as needed for wheezing or shortness of breath. 1 Inhaler 2  . cyclobenzaprine (FLEXERIL) 5 MG tablet TAKE 1 TABLET BY MOUTH 3 TIMES A DAY AS NEEDED FOR MUSCLE SPASMS 60 tablet 2  . escitalopram (LEXAPRO) 10 MG tablet TAKE 1 TABLET BY MOUTH EVERY DAY 90 tablet 1  . meloxicam (MOBIC) 15 MG tablet TAKE 1 TABLET BY MOUTH EVERY DAY 30 tablet 3  . OVER THE COUNTER MEDICATION Take 1-2 tablets by mouth daily.    Marland Kitchen VIORELE 0.15-0.02/0.01 MG (21/5) tablet Take 1 tablet by mouth daily.  11   No current facility-administered medications on file prior to visit.    Review of Systems Constitutional: Negative for other unusual diaphoresis, sweats, appetite or weight changes HENT: Negative for other worsening hearing loss, ear pain, facial swelling, mouth sores or neck stiffness.   Eyes: Negative for other worsening pain, redness or other visual disturbance.  Respiratory: Negative for other stridor or swelling Cardiovascular: Negative for other palpitations or other chest pain  Gastrointestinal: Negative for worsening diarrhea or loose stools, blood in stool, distention or other pain Genitourinary: Negative for hematuria, flank pain or other change in urine volume.  Musculoskeletal: Negative for myalgias or other joint swelling.  Skin: Negative for other color change, or other wound  or worsening drainage.  Neurological: Negative for other syncope or numbness. Hematological: Negative for other adenopathy or swelling Psychiatric/Behavioral: Negative for hallucinations, other worsening agitation, SI, self-injury, or new decreased concentration All other system neg per pt    Objective:   Physical Exam BP 116/78   Pulse 88   Temp 98.2 F (36.8 C) (Oral)   Ht 5\' 9"  (1.753 m)   Wt 219  lb (99.3 kg)   SpO2 98%   BMI 32.34 kg/m  VS noted,  Constitutional: Pt is oriented to person, place, and time. Appears well-developed and well-nourished, in no significant distress and comfortable Head: Normocephalic and atraumatic  Eyes: Conjunctivae and EOM are normal. Pupils are equal, round, and reactive to light Right Ear: External ear normal without discharge Left Ear: External ear normal without discharge Nose: Nose without discharge or deformity Mouth/Throat: Oropharynx is without other ulcerations and moist  Neck: Normal range of motion. Neck supple. No JVD present. No tracheal deviation present or significant neck LA or mass Cardiovascular: Normal rate, regular rhythm, normal heart sounds and intact distal pulses.   Pulmonary/Chest: WOB normal and breath sounds without rales or wheezing  Abdominal: Soft. Bowel sounds are normal. NT. No HSM  Musculoskeletal: Normal range of motion. Exhibits no edema Lymphadenopathy: Has no other cervical adenopathy.  Neurological: Pt is alert and oriented to person, place, and time. Pt has normal reflexes. No cranial nerve deficit. Motor grossly intact, Gait intact Skin: Skin is warm and dry. No rash noted or new ulcerations Psychiatric:  Has normal mood and affect. Behavior is normal without agitation No other exam findings    Assessment & Plan:

## 2017-03-11 NOTE — Patient Instructions (Addendum)
OK to add the adderall 10 mg about 2 pm daily  Please continue all other medications as before, and refills have been done if requested.  Please have the pharmacy call with any other refills you may need.  Please continue your efforts at being more active, low cholesterol diet, and weight control.  You are otherwise up to date with prevention measures today.  Please keep your appointments with your specialists as you may have planned  Please go to the LAB in the Basement (turn left off the elevator) for the tests to be done today  You will be contacted by phone if any changes need to be made immediately.  Otherwise, you will receive a letter about your results with an explanation, but please check with MyChart first.  Please remember to sign up for MyChart if you have not done so, as this will be important to you in the future with finding out test results, communicating by private email, and scheduling acute appointments online when needed.  Please return in 6 months, or sooner if needed

## 2017-03-13 ENCOUNTER — Encounter: Payer: Self-pay | Admitting: Internal Medicine

## 2017-03-13 NOTE — Assessment & Plan Note (Signed)
Mild uncontrolled, to cont adderal ER 20 in am, add adderal 10 qd about mid day, to f/u any worsening symptoms or concerns

## 2017-03-13 NOTE — Assessment & Plan Note (Signed)

## 2017-03-13 NOTE — Assessment & Plan Note (Signed)
Asympt, mild,  Lab Results  Component Value Date   HGBA1C 5.2 03/11/2017  stable overall by history and exam, recent data reviewed with pt, and pt to continue medical treatment as before,  to f/u any worsening symptoms or concerns

## 2017-04-07 ENCOUNTER — Encounter: Payer: Self-pay | Admitting: Internal Medicine

## 2017-04-07 MED ORDER — AMPHETAMINE-DEXTROAMPHETAMINE 10 MG PO TABS: ORAL_TABLET | ORAL | 0 refills | Status: DC

## 2017-04-07 MED ORDER — AMPHETAMINE-DEXTROAMPHET ER 20 MG PO CP24: 20.0000 mg | ORAL_CAPSULE | ORAL | 0 refills | Status: DC

## 2017-05-03 ENCOUNTER — Encounter: Payer: Self-pay | Admitting: Internal Medicine

## 2017-05-03 MED ORDER — AMPHETAMINE-DEXTROAMPHETAMINE 10 MG PO TABS: ORAL_TABLET | ORAL | 0 refills | Status: DC

## 2017-05-03 MED ORDER — AMPHETAMINE-DEXTROAMPHET ER 20 MG PO CP24: 20.0000 mg | ORAL_CAPSULE | ORAL | 0 refills | Status: DC

## 2017-06-01 ENCOUNTER — Other Ambulatory Visit: Payer: Self-pay | Admitting: Internal Medicine

## 2017-06-01 ENCOUNTER — Encounter: Payer: Self-pay | Admitting: Internal Medicine

## 2017-06-01 MED ORDER — AMPHETAMINE-DEXTROAMPHETAMINE 10 MG PO TABS: ORAL_TABLET | ORAL | 0 refills | Status: DC

## 2017-06-01 MED ORDER — AMPHETAMINE-DEXTROAMPHET ER 20 MG PO CP24: 20.0000 mg | ORAL_CAPSULE | ORAL | 0 refills | Status: DC

## 2017-06-01 NOTE — Telephone Encounter (Signed)
This is already addressed today

## 2017-06-01 NOTE — Telephone Encounter (Signed)
Done erx 

## 2017-06-30 ENCOUNTER — Other Ambulatory Visit: Payer: Self-pay | Admitting: Internal Medicine

## 2017-06-30 MED ORDER — AMPHETAMINE-DEXTROAMPHET ER 20 MG PO CP24: 20.0000 mg | ORAL_CAPSULE | ORAL | 0 refills | Status: DC

## 2017-06-30 MED ORDER — AMPHETAMINE-DEXTROAMPHETAMINE 10 MG PO TABS: ORAL_TABLET | ORAL | 0 refills | Status: DC

## 2017-06-30 NOTE — Telephone Encounter (Signed)
Done erx 

## 2017-07-26 ENCOUNTER — Other Ambulatory Visit: Payer: Self-pay | Admitting: Obstetrics and Gynecology

## 2017-07-26 ENCOUNTER — Other Ambulatory Visit (HOSPITAL_COMMUNITY)
Admission: RE | Admit: 2017-07-26 | Discharge: 2017-07-26 | Disposition: A | Discharge status: Home / Self Care | Payer: BLUE CROSS/BLUE SHIELD | Source: Ambulatory Visit | Attending: Obstetrics and Gynecology | Admitting: Obstetrics and Gynecology

## 2017-07-26 ENCOUNTER — Other Ambulatory Visit: Payer: Self-pay | Admitting: Internal Medicine

## 2017-07-26 DIAGNOSIS — N6310 Unspecified lump in the right breast, unspecified quadrant: Secondary | ICD-10-CM

## 2017-07-26 DIAGNOSIS — Z01411 Encounter for gynecological examination (general) (routine) with abnormal findings: Secondary | ICD-10-CM | POA: Insufficient documentation

## 2017-07-26 DIAGNOSIS — N6320 Unspecified lump in the left breast, unspecified quadrant: Secondary | ICD-10-CM

## 2017-07-27 ENCOUNTER — Encounter: Payer: Self-pay | Admitting: Internal Medicine

## 2017-07-27 MED ORDER — AMPHETAMINE-DEXTROAMPHET ER 20 MG PO CP24: 20.0000 mg | ORAL_CAPSULE | ORAL | 0 refills | Status: DC

## 2017-07-27 MED ORDER — AMPHETAMINE-DEXTROAMPHETAMINE 10 MG PO TABS: ORAL_TABLET | ORAL | 0 refills | Status: DC

## 2017-07-27 NOTE — Telephone Encounter (Signed)
Done erx 

## 2017-07-28 LAB — CYTOLOGY - PAP
Diagnosis: NEGATIVE
HPV 16/18/45 GENOTYPING: NEGATIVE
HPV: DETECTED — AB

## 2017-08-02 ENCOUNTER — Other Ambulatory Visit: Payer: Self-pay

## 2017-08-02 ENCOUNTER — Ambulatory Visit: Payer: Self-pay

## 2017-08-02 ENCOUNTER — Ambulatory Visit
Admission: RE | Admit: 2017-08-02 | Discharge: 2017-08-02 | Disposition: A | Discharge status: Home / Self Care | Payer: BLUE CROSS/BLUE SHIELD | Source: Ambulatory Visit | Attending: Obstetrics and Gynecology | Admitting: Obstetrics and Gynecology

## 2017-08-02 DIAGNOSIS — N6310 Unspecified lump in the right breast, unspecified quadrant: Secondary | ICD-10-CM

## 2017-08-02 DIAGNOSIS — N6320 Unspecified lump in the left breast, unspecified quadrant: Secondary | ICD-10-CM

## 2017-08-11 DIAGNOSIS — N840 Polyp of corpus uteri: Secondary | ICD-10-CM | POA: Diagnosis not present

## 2017-08-31 ENCOUNTER — Other Ambulatory Visit: Payer: Self-pay | Admitting: Internal Medicine

## 2017-08-31 MED ORDER — AMPHETAMINE-DEXTROAMPHET ER 20 MG PO CP24: 20.0000 mg | ORAL_CAPSULE | ORAL | 0 refills | Status: DC

## 2017-08-31 MED ORDER — AMPHETAMINE-DEXTROAMPHETAMINE 10 MG PO TABS: ORAL_TABLET | ORAL | 0 refills | Status: DC

## 2017-08-31 NOTE — Telephone Encounter (Signed)
Done erx 

## 2017-08-31 NOTE — Telephone Encounter (Signed)
MD approved sent electronically to [facility].Marland Kitchen.Raechel Chute/lmb

## 2017-09-01 ENCOUNTER — Other Ambulatory Visit: Payer: Self-pay | Admitting: Internal Medicine

## 2017-09-06 ENCOUNTER — Other Ambulatory Visit: Payer: Self-pay | Admitting: Internal Medicine

## 2017-09-08 ENCOUNTER — Emergency Department (HOSPITAL_COMMUNITY)
Admission: EM | Admit: 2017-09-08 | Discharge: 2017-09-08 | Discharge status: Against Medical Advice | Payer: BLUE CROSS/BLUE SHIELD

## 2017-09-08 NOTE — ED Notes (Signed)
Called pt from lobby x2 No response

## 2017-09-08 NOTE — ED Notes (Signed)
Called pt from lobby for V/S, EKG x1 No response

## 2017-10-01 ENCOUNTER — Other Ambulatory Visit: Payer: Self-pay | Admitting: Internal Medicine

## 2017-10-01 MED ORDER — AMPHETAMINE-DEXTROAMPHETAMINE 10 MG PO TABS: ORAL_TABLET | ORAL | 0 refills | Status: DC

## 2017-10-01 MED ORDER — AMPHETAMINE-DEXTROAMPHET ER 20 MG PO CP24: 20.0000 mg | ORAL_CAPSULE | ORAL | 0 refills | Status: DC

## 2017-10-01 NOTE — Telephone Encounter (Signed)
Done erx (both rx)

## 2017-10-01 NOTE — Telephone Encounter (Signed)
   LOV: 03/11/17 NextOV:not scheduled Last Filled/Quantity: 08/31/17 30#

## 2017-10-13 ENCOUNTER — Ambulatory Visit (HOSPITAL_COMMUNITY)
Admission: RE | Admit: 2017-10-13 | Discharge: 2017-10-13 | Disposition: A | Discharge status: Home / Self Care | Payer: BLUE CROSS/BLUE SHIELD | Source: Home / Self Care | Attending: Psychiatry | Admitting: Psychiatry

## 2017-10-13 ENCOUNTER — Emergency Department (HOSPITAL_COMMUNITY): Payer: BLUE CROSS/BLUE SHIELD

## 2017-10-13 ENCOUNTER — Emergency Department (HOSPITAL_COMMUNITY)
Admission: EM | Admit: 2017-10-13 | Discharge: 2017-10-14 | Disposition: A | Discharge status: Other Acute Inpatient Hospital | Payer: BLUE CROSS/BLUE SHIELD | Source: Home / Self Care | Attending: Emergency Medicine | Admitting: Emergency Medicine

## 2017-10-13 ENCOUNTER — Emergency Department (HOSPITAL_COMMUNITY)
Admission: EM | Admit: 2017-10-13 | Discharge: 2017-10-13 | Disposition: A | Discharge status: Home / Self Care | Payer: BLUE CROSS/BLUE SHIELD | Source: Home / Self Care | Attending: Emergency Medicine | Admitting: Emergency Medicine

## 2017-10-13 ENCOUNTER — Encounter (HOSPITAL_COMMUNITY): Payer: Self-pay

## 2017-10-13 ENCOUNTER — Other Ambulatory Visit: Payer: Self-pay

## 2017-10-13 ENCOUNTER — Emergency Department (HOSPITAL_COMMUNITY)
Admission: EM | Admit: 2017-10-13 | Discharge: 2017-10-13 | Disposition: A | Discharge status: Home / Self Care | Payer: BLUE CROSS/BLUE SHIELD | Attending: Emergency Medicine | Admitting: Emergency Medicine

## 2017-10-13 DIAGNOSIS — G9389 Other specified disorders of brain: Secondary | ICD-10-CM | POA: Diagnosis not present

## 2017-10-13 DIAGNOSIS — M7918 Myalgia, other site: Secondary | ICD-10-CM | POA: Diagnosis not present

## 2017-10-13 DIAGNOSIS — R109 Unspecified abdominal pain: Secondary | ICD-10-CM | POA: Insufficient documentation

## 2017-10-13 DIAGNOSIS — F29 Unspecified psychosis not due to a substance or known physiological condition: Secondary | ICD-10-CM

## 2017-10-13 DIAGNOSIS — F989 Unspecified behavioral and emotional disorders with onset usually occurring in childhood and adolescence: Secondary | ICD-10-CM | POA: Diagnosis not present

## 2017-10-13 DIAGNOSIS — Z5321 Procedure and treatment not carried out due to patient leaving prior to being seen by health care provider: Secondary | ICD-10-CM

## 2017-10-13 DIAGNOSIS — R0602 Shortness of breath: Secondary | ICD-10-CM | POA: Insufficient documentation

## 2017-10-13 DIAGNOSIS — D72829 Elevated white blood cell count, unspecified: Secondary | ICD-10-CM

## 2017-10-13 DIAGNOSIS — R4020 Unspecified coma: Secondary | ICD-10-CM

## 2017-10-13 DIAGNOSIS — R4689 Other symptoms and signs involving appearance and behavior: Secondary | ICD-10-CM

## 2017-10-13 DIAGNOSIS — R05 Cough: Secondary | ICD-10-CM | POA: Diagnosis not present

## 2017-10-13 HISTORY — DX: Unspecified coma: R40.20

## 2017-10-13 LAB — URINALYSIS, ROUTINE W REFLEX MICROSCOPIC
BILIRUBIN URINE: NEGATIVE
Glucose, UA: NEGATIVE mg/dL
Hgb urine dipstick: NEGATIVE
Ketones, ur: NEGATIVE mg/dL
LEUKOCYTES UA: NEGATIVE
Nitrite: NEGATIVE
Protein, ur: 30 mg/dL — AB
SPECIFIC GRAVITY, URINE: 1.009 (ref 1.005–1.030)
pH: 6 (ref 5.0–8.0)

## 2017-10-13 LAB — COMPREHENSIVE METABOLIC PANEL
ALK PHOS: 108 U/L (ref 38–126)
ALT: 24 U/L (ref 0–44)
ANION GAP: 11 (ref 5–15)
AST: 27 U/L (ref 15–41)
Albumin: 4.8 g/dL (ref 3.5–5.0)
BILIRUBIN TOTAL: 0.6 mg/dL (ref 0.3–1.2)
BUN: 5 mg/dL — AB (ref 6–20)
CALCIUM: 9.9 mg/dL (ref 8.9–10.3)
CO2: 28 mmol/L (ref 22–32)
Chloride: 104 mmol/L (ref 98–111)
Creatinine, Ser: 0.68 mg/dL (ref 0.44–1.00)
GFR calc Af Amer: >60 mL/min (ref >60)
GLUCOSE: 116 mg/dL — AB (ref 70–99)
Potassium: 3.4 mmol/L — ABNORMAL LOW (ref 3.5–5.1)
Sodium: 143 mmol/L (ref 135–145)
TOTAL PROTEIN: 7.8 g/dL (ref 6.5–8.1)

## 2017-10-13 LAB — CBC
HCT: 41.4 % (ref 36.0–46.0)
Hemoglobin: 13.9 g/dL (ref 12.0–15.0)
MCH: 30.5 pg (ref 26.0–34.0)
MCHC: 33.6 g/dL (ref 30.0–36.0)
MCV: 90.8 fL (ref 78.0–100.0)
PLATELETS: 319 10*3/uL (ref 150–400)
RBC: 4.56 MIL/uL (ref 3.87–5.11)
RDW: 12.8 % (ref 11.5–15.5)
WBC: 17 10*3/uL — ABNORMAL HIGH (ref 4.0–10.5)

## 2017-10-13 LAB — DIFFERENTIAL
Basophils Absolute: 0 10*3/uL (ref 0.0–0.1)
Basophils Relative: 0 %
EOS ABS: 0 10*3/uL (ref 0.0–0.7)
Eosinophils Relative: 0 %
LYMPHS ABS: 2.9 10*3/uL (ref 0.7–4.0)
Lymphocytes Relative: 18 %
Monocytes Absolute: 1.3 10*3/uL — ABNORMAL HIGH (ref 0.1–1.0)
Monocytes Relative: 8 %
Neutro Abs: 12 10*3/uL — ABNORMAL HIGH (ref 1.7–7.7)
Neutrophils Relative %: 74 %

## 2017-10-13 LAB — VITAMIN B12: VITAMIN B 12: 485 pg/mL (ref 180–914)

## 2017-10-13 LAB — RAPID URINE DRUG SCREEN, HOSP PERFORMED
Amphetamines: POSITIVE — AB
BARBITURATES: NOT DETECTED
Benzodiazepines: NOT DETECTED
Cocaine: NOT DETECTED
Opiates: NOT DETECTED
Tetrahydrocannabinol: NOT DETECTED

## 2017-10-13 LAB — I-STAT BETA HCG BLOOD, ED (MC, WL, AP ONLY)

## 2017-10-13 LAB — ETHANOL

## 2017-10-13 LAB — ACETAMINOPHEN LEVEL

## 2017-10-13 LAB — SALICYLATE LEVEL: Salicylate Lvl: <7 mg/dL (ref 2.8–30.0)

## 2017-10-13 LAB — TSH: TSH: 1.088 u[IU]/mL (ref 0.350–4.500)

## 2017-10-13 MED ORDER — LORAZEPAM 2 MG/ML IJ SOLN
1.0000 mg | Freq: Once | INTRAMUSCULAR | Status: AC
  Administered 2017-10-13: 1 mg via INTRAVENOUS
  Filled 2017-10-13: qty 1

## 2017-10-13 MED ORDER — POTASSIUM CHLORIDE CRYS ER 20 MEQ PO TBCR
30.0000 meq | EXTENDED_RELEASE_TABLET | Freq: Once | ORAL | Status: AC
  Administered 2017-10-14: 30 meq via ORAL
  Filled 2017-10-13: qty 1

## 2017-10-13 MED ORDER — LORAZEPAM 0.5 MG PO TABS: 0.5000 mg | ORAL_TABLET | Freq: Four times a day (QID) | ORAL | Status: DC | PRN

## 2017-10-13 MED ORDER — HALOPERIDOL LACTATE 5 MG/ML IJ SOLN
5.0000 mg | Freq: Once | INTRAMUSCULAR | Status: AC
  Administered 2017-10-13: 5 mg via INTRAMUSCULAR
  Filled 2017-10-13: qty 1

## 2017-10-13 NOTE — ED Notes (Signed)
Per registration staff: pt came to window and asked to be let back into triage. Pt was informed that she was dismissed from the system because she told the triage RN that she was leaving. Pt stated, "I went outside for some fresh air." Pt informed that if she left again she would be dismissed from the system again.
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
## 2017-10-13 NOTE — ED Notes (Signed)
Patient returned from to X-ray. 

## 2017-10-13 NOTE — ED Triage Notes (Signed)
Pt called from triage for a room with no answer, pt not seen in the lobby

## 2017-10-13 NOTE — ED Notes (Signed)
Pt complains of general body aches, shortness of breath, a metallic taste in her mouth and not being able to eat for two days

## 2017-10-13 NOTE — H&P (Signed)
Behavioral Health Medical Screening Exam  [name] is an 37 y.o. female patient [facility] with complaints that something is wrong with her body.  States that she has this metallic taste in her mouth, leaking urine, and abdominal pain.  Patient states she feels someone or something is doing something to her body at night because she wakes up the next morning in worse pain.  Patient also states that something is going on in the community.  Patient denies suicidal/self-harm/homicidal ideation, psychosis, and paranoia.  Patient states that she went to the hospital last night but they did not do anything for her.   Total Time spent with patient: 30 minutes  Psychiatric Specialty Exam: Physical Exam  Constitutional: She is oriented to person, place, and time. She appears well-developed and well-nourished.  Neck: Normal range of motion. Neck supple.  Respiratory: Effort normal.  Musculoskeletal: Normal range of motion.  Neurological: She is alert and oriented to person, place, and time.  Skin: Skin is warm and dry.  Psychiatric: Her mood appears anxious. Thought content is paranoid and delusional. She expresses impulsivity.    Review of Systems  Psychiatric/Behavioral: Hallucinations: Denies. Suicidal ideas: Denies. The patient is nervous/anxious.   All other systems reviewed and are negative.   Blood pressure 139/72, pulse 92, temperature 97.9 F (36.6 C), resp. rate 18, last menstrual period 09/29/2017, SpO2 100 %.There is no height or weight on file to calculate BMI.  General Appearance: Casual  Eye Contact:  Good  Speech:  Clear and Coherent and Pressured  Volume:  Normal  Mood:  Anxious  Affect:  Full Range  Thought Process:  Disorganized  Orientation:  Other:  Oriented to self and place  Thought Content:  Delusions and Paranoid Ideation  Suicidal Thoughts:  No  Homicidal Thoughts:  No  Memory:  Immediate;   Fair Recent;   Fair Remote;   Fair  Judgement:   Impaired  Insight:  Lacking  Psychomotor Activity:  Restlessness  Concentration: Concentration: Fair and Attention Span: Fair  Recall:  FiservFair  Fund of Knowledge:Fair  Language: Good  Akathisia:  No  Handed:  Right  AIMS (if indicated):     Assets:  Communication Skills Desire for Improvement Housing Social Support  Sleep:       Musculoskeletal: Strength & Muscle Tone: within normal limits Gait & Station: normal Patient leans: N/A  Blood pressure 139/72, pulse 92, temperature 97.9 F (36.6 C), resp. rate 18, last menstrual period 09/29/2017, SpO2 100 %.  Recommendations:  Inpatient psychiatric treatment once medically cleared  Based on my evaluation the patient appears to have an emergency medical condition for which I recommend the patient be transferred to the emergency department for further evaluation.  Severn Goddard, NP 10/13/2017, 12:54 PM

## 2017-10-13 NOTE — ED Notes (Signed)
Patient refused lab draw @ this time. Observed patient walking out of triage room.

## 2017-10-13 NOTE — ED Triage Notes (Signed)
Patient arrives stating, "I need help, I want a bed, some real food, and some xrays." Patient reports abdominal pain and states "pee has been running out of me and i've been seen and nobody has done anything." Patient restless and with pressure speech in triage. Denies SI/HI, A/V hallucinations. Patient reports decrease sleep. Denies any N/V/D.

## 2017-10-13 NOTE — ED Notes (Signed)
Bed: [facility] Expected date:  Expected time:  Means of arrival:  Comments: 

## 2017-10-13 NOTE — ED Notes (Signed)
Patient sitting outside of door, I asked patient to come to triage room, patient requesting to leave. Patient states, "I'm hungry, can I walk over to  Behavioral health." Patient ambulated out of ED with steady gait in no distress.

## 2017-10-13 NOTE — ED Notes (Signed)
Patient observed walking back into triage 3, informed patient to stay in room to be treated.

## 2017-10-13 NOTE — ED Notes (Signed)
Patient observed walking out of triage.

## 2017-10-13 NOTE — ED Notes (Signed)
Patient transported to X-ray via WC. 

## 2017-10-13 NOTE — BH Assessment (Signed)
Assessment Note  [name] is an 37 y.o. female. Pt denies SI/HI and AVH. Pt denies previous SI attempts. Pt reports paranoia. Pt states she feels someone is watching her and someone is causing her to have severe stomach pain. The Pt was poor historian. Pt's speech was tangential and had flight of ideas. Pt states there is mental in her body and in her clothes so she needs Xrays to see how the mental is affecting her body. Pt denies previous hospitalization.   Shuvon, NP recommends inpatient. [facility].   Diagnosis:  F23 Brief Psychotic Disorder  Past Medical History:  Past Medical History:  Diagnosis Date  . ADD 10/22/2009  . ALLERGIC RHINITIS 09/26/2008  . ANXIETY 09/26/2008  . CONSTIPATION 09/26/2008  . DEPRESSION 09/26/2008  . FATIGUE 01/14/2009  . HYPERLIPIDEMIA 09/26/2008  . OTITIS MEDIA, LEFT 01/14/2009  . SKIN LESION 09/26/2008  . TONSILLITIS, ACUTE 01/14/2009  . URI 10/22/2009    Past Surgical History:  Procedure Laterality Date  . extraction of wisdom teeth     error in charting  . polyp removal       Family History:  Family History  Problem Relation Age of Onset  . Diabetes Other   . Hypertension Other   . Thyroid disease Mother   . Hypertension Mother     Social History:  reports that she has been smoking cigarettes. She has never used smokeless tobacco. She reports that she drinks alcohol. She reports that she does not use drugs.  Additional Social History:  Alcohol / Drug Use Pain Medications: please see mar Prescriptions: please see mar Over the Counter: please see mar History of alcohol / drug use?: No history of alcohol / drug abuse Longest period of sobriety (when/how long): NA  CIWA: CIWA-Ar BP: 139/72 Pulse Rate: 92 COWS:    Allergies: No Known Allergies  Home Medications:  (Not in a hospital admission)  OB/GYN Status:  Patient's last menstrual period was 09/29/2017.  General Assessment Data [facility]  [facility] TTS Assessment: In system Is this a Tele or Face-to-Face Assessment?: Face-to-Face Is this an Initial Assessment or a Re-assessment for this encounter?: Initial Assessment Patient Accompanied by:: N/A Language Other than English: No Living Arrangements: Other (Comment)(home) What gender do you identify as?: Female Marital status: Single Maiden name: NA Pregnancy Status: No Living Arrangements: Parent Can pt return to current living arrangement?: No Admission Status: Voluntary Is patient capable of signing voluntary admission?: Yes Referral Source: Self/Family/Friend Insurance type: Scientist, research (physical sciences)BCBS  Medical Screening Exam [facility] Walk[facility] ONLY) Medical Exam completed: Yes  Crisis Care Plan Living Arrangements: Parent Legal Guardian: Other:(self) Name of Psychiatrist: NA Name of Therapist: NA  Education Status Is patient currently in school?: No Is the patient employed, unemployed or receiving disability?: Unemployed  Risk to self with the past 6 months Suicidal Ideation: No Has patient been a risk to self within the past 6 months prior to admission? : No Suicidal Intent: No Has patient had any suicidal intent within the past 6 months prior to admission? : No Is patient at risk for suicide?: No Suicidal Plan?: No Has patient had any suicidal plan within the past 6 months prior to admission? : No Access to Means: No What has been your use of drugs/alcohol within the last 12 months?: NA Previous Attempts/Gestures: No How many times?: 0 Other Self Harm Risks: NA Triggers for Past Attempts: None known Intentional Self Injurious Behavior: None Family Suicide History:  No Recent stressful life event(s): Other (Comment)(unknown) Persecutory voices/beliefs?: No Depression: No Depression Symptoms: (pt denies) Substance abuse history and/or treatment for substance abuse?: No Suicide prevention information given to non-admitted patients: Not  applicable  Risk to Others within the past 6 months Homicidal Ideation: No Does patient have any lifetime risk of violence toward others beyond the six months prior to admission? : No Thoughts of Harm to Others: No Current Homicidal Intent: No Current Homicidal Plan: No Access to Homicidal Means: No Identified Victim: NA History of harm to others?: No Assessment of Violence: None Noted Violent Behavior Description: NA Does patient have access to weapons?: No Criminal Charges Pending?: No Does patient have a court date: No Is patient on probation?: No  Psychosis Hallucinations: None noted Delusions: None noted  Mental Status Report Appearance/Hygiene: Unremarkable Eye Contact: Fair Motor Activity: Freedom of movement Speech: Logical/coherent Level of Consciousness: Alert Mood: Anxious Affect: Anxious Anxiety Level: Moderate Thought Processes: Coherent, Relevant Judgement: Unimpaired Orientation: Person, Place, Time, Situation Obsessive Compulsive Thoughts/Behaviors: None  Cognitive Functioning Concentration: Normal Memory: Recent Intact, Remote Intact Is patient IDD: No Insight: Fair Impulse Control: Fair Appetite: Fair Have you had any weight changes? : No Change Sleep: Unable to Assess Total Hours of Sleep: 8 Vegetative Symptoms: Unable to Assess  ADLScreening [facility] Assessment Services) Patient's cognitive ability adequate to safely complete daily activities?: Yes Patient able to express need for assistance with ADLs?: Yes Independently performs ADLs?: Yes (appropriate for developmental age)  Prior Inpatient Therapy Prior Inpatient Therapy: No  Prior Outpatient Therapy Prior Outpatient Therapy: No  ADL Screening (condition at time of admission) Patient's cognitive ability adequate to safely complete daily activities?: Yes Is the patient deaf or have difficulty hearing?: No Does the patient have difficulty seeing, even when wearing glasses/contacts?:  No Does the patient have difficulty concentrating, remembering, or making decisions?: No Patient able to express need for assistance with ADLs?: Yes Does the patient have difficulty dressing or bathing?: No Independently performs ADLs?: Yes (appropriate for developmental age) Does the patient have difficulty walking or climbing stairs?: No       Abuse/Neglect Assessment (Assessment to be complete while patient is alone) Abuse/Neglect Assessment Can Be Completed: Yes Physical Abuse: Denies Verbal Abuse: Denies Sexual Abuse: Denies Exploitation of patient/patient's resources: Denies     Merchant navy officer (For Healthcare) Does Patient Have a Medical Advance Directive?: No Would patient like information on creating a medical advance directive?: No - Patient declined          Disposition:  Disposition Initial Assessment Completed for this Encounter: Yes Disposition of Patient: Movement to [facility] or [facility] ED Patient refused recommended treatment: No Mode of transportation if patient is discharged?: Car  On Site Evaluation by:   Reviewed with Physician:    Emmit Pomfret 10/13/2017 1:38 PM

## 2017-10-13 NOTE — Progress Notes (Signed)
Current [facility] AC request another pt be assessed prior to completing this assessment. TTS will complete assessment for this pt later this date.   Arnet Hofferber, MSW, LCSW Therapeutic Triage Specialist  [phone]  
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
## 2017-10-13 NOTE — ED Triage Notes (Signed)
Pt walked out to the nursing station and requested some oxygen because she was having trouble breathing, pt was reassured that her O2 level was normal, I placed the patient on 2liters of O2, the patient said she felt like she was dying and then shortly after she walked out of the ED.  Pt was walking and talking on her way out of the ED.

## 2017-10-13 NOTE — ED Notes (Signed)
[employer] speaking with patient.  

## 2017-10-13 NOTE — ED Notes (Signed)
Pt medicated for anxiety apprehension and flight of ideas related to blood draw labs obtained.

## 2017-10-13 NOTE — ED Notes (Addendum)
Pt states that she is scared and doesn't want anyone to touch her. She refuses blood draw at this time. Will wait on provider to see patient.

## 2017-10-14 ENCOUNTER — Other Ambulatory Visit: Payer: Self-pay

## 2017-10-14 ENCOUNTER — Inpatient Hospital Stay (HOSPITAL_COMMUNITY)
Admission: AD | Admit: 2017-10-14 | Discharge: 2017-10-19 | DRG: 885 | Disposition: A | Discharge status: Home / Self Care | Payer: BLUE CROSS/BLUE SHIELD | Attending: Psychiatry | Admitting: Psychiatry

## 2017-10-14 ENCOUNTER — Encounter (HOSPITAL_COMMUNITY): Payer: Self-pay

## 2017-10-14 DIAGNOSIS — Z91410 Personal history of adult physical and sexual abuse: Secondary | ICD-10-CM | POA: Diagnosis not present

## 2017-10-14 DIAGNOSIS — F909 Attention-deficit hyperactivity disorder, unspecified type: Secondary | ICD-10-CM | POA: Diagnosis present

## 2017-10-14 DIAGNOSIS — R451 Restlessness and agitation: Secondary | ICD-10-CM | POA: Diagnosis not present

## 2017-10-14 DIAGNOSIS — Z5321 Procedure and treatment not carried out due to patient leaving prior to being seen by health care provider: Secondary | ICD-10-CM | POA: Diagnosis not present

## 2017-10-14 DIAGNOSIS — Z79899 Other long term (current) drug therapy: Secondary | ICD-10-CM

## 2017-10-14 DIAGNOSIS — F302 Manic episode, severe with psychotic symptoms: Secondary | ICD-10-CM | POA: Diagnosis present

## 2017-10-14 DIAGNOSIS — F411 Generalized anxiety disorder: Secondary | ICD-10-CM | POA: Diagnosis present

## 2017-10-14 DIAGNOSIS — M62838 Other muscle spasm: Secondary | ICD-10-CM | POA: Diagnosis present

## 2017-10-14 DIAGNOSIS — G4700 Insomnia, unspecified: Secondary | ICD-10-CM | POA: Diagnosis present

## 2017-10-14 DIAGNOSIS — E785 Hyperlipidemia, unspecified: Secondary | ICD-10-CM | POA: Diagnosis not present

## 2017-10-14 DIAGNOSIS — M7918 Myalgia, other site: Secondary | ICD-10-CM | POA: Diagnosis not present

## 2017-10-14 DIAGNOSIS — F17210 Nicotine dependence, cigarettes, uncomplicated: Secondary | ICD-10-CM | POA: Diagnosis present

## 2017-10-14 DIAGNOSIS — F419 Anxiety disorder, unspecified: Secondary | ICD-10-CM | POA: Diagnosis not present

## 2017-10-14 DIAGNOSIS — R0602 Shortness of breath: Secondary | ICD-10-CM | POA: Diagnosis not present

## 2017-10-14 LAB — RPR: RPR Ser Ql: NONREACTIVE

## 2017-10-14 LAB — HIV ANTIBODY (ROUTINE TESTING W REFLEX): HIV SCREEN 4TH GENERATION: NONREACTIVE

## 2017-10-14 MED ORDER — ESCITALOPRAM OXALATE 10 MG PO TABS
10.0000 mg | ORAL_TABLET | Freq: Every day | ORAL | Status: DC
  Administered 2017-10-15 – 2017-10-16 (×2): 10 mg via ORAL
  Filled 2017-10-14 (×4): qty 1

## 2017-10-14 MED ORDER — LORAZEPAM 0.5 MG PO TABS: 0.5000 mg | ORAL_TABLET | Freq: Three times a day (TID) | ORAL | Status: DC | PRN

## 2017-10-14 MED ORDER — ACETAMINOPHEN 325 MG PO TABS
650.0000 mg | ORAL_TABLET | Freq: Four times a day (QID) | ORAL | Status: DC | PRN
  Administered 2017-10-17 – 2017-10-18 (×3): 650 mg via ORAL
  Filled 2017-10-14 (×3): qty 2

## 2017-10-14 MED ORDER — HYDROXYZINE HCL 25 MG PO TABS: 25.0000 mg | ORAL_TABLET | Freq: Three times a day (TID) | ORAL | Status: DC | PRN

## 2017-10-14 MED ORDER — ESCITALOPRAM OXALATE 10 MG PO TABS
10.0000 mg | ORAL_TABLET | Freq: Every day | ORAL | Status: DC
  Administered 2017-10-14: 10 mg via ORAL
  Filled 2017-10-14: qty 1

## 2017-10-14 MED ORDER — ALUM & MAG HYDROXIDE-SIMETH 200-200-20 MG/5ML PO SUSP: 30.0000 mL | ORAL | Status: DC | PRN

## 2017-10-14 MED ORDER — LORAZEPAM 0.5 MG PO TABS
0.5000 mg | ORAL_TABLET | Freq: Three times a day (TID) | ORAL | Status: DC | PRN
  Filled 2017-10-14: qty 1

## 2017-10-14 MED ORDER — TRAZODONE HCL 50 MG PO TABS: 50.0000 mg | ORAL_TABLET | Freq: Every evening | ORAL | Status: DC | PRN

## 2017-10-14 MED ORDER — NICOTINE 21 MG/24HR TD PT24
21.0000 mg | MEDICATED_PATCH | Freq: Every day | TRANSDERMAL | Status: DC
  Filled 2017-10-14 (×5): qty 1

## 2017-10-14 MED ORDER — MAGNESIUM HYDROXIDE 400 MG/5ML PO SUSP: 30.0000 mL | Freq: Every day | ORAL | Status: DC | PRN

## 2017-10-14 NOTE — Progress Notes (Signed)
The  Patient expressed that she was pleased to be in the hospital and was grateful for the employees support. Her goal for tomorrow is to contact her family about bringing in her glasses.

## 2017-10-14 NOTE — ED Notes (Signed)
Pt accepted to 505-2 and can go over 1600.
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
## 2017-10-14 NOTE — BH Assessment (Signed)
Patient accepted for involuntary admission to [facility]. Bed 505-2, [place]. Please call report to [phone]. Bed available today at 1600.

## 2017-10-14 NOTE — Tx Team (Signed)
Initial Treatment Plan 10/14/2017 6:20 PM Benson SettingAmanda J Edge UJW:[id number]    PATIENT STRESSORS: Health problems Medication change or noncompliance Substance abuse   PATIENT STRENGTHS: Ability for insight Capable of independent living General fund of knowledge Supportive family/friends   PATIENT IDENTIFIED PROBLEMS: Pt stated that she doesn't know why she is here  Pt stated she just wanted to talk to someone about her bruises and now she is here                   DISCHARGE CRITERIA:  Ability to meet basic life and health needs Adequate post-discharge living arrangements Improved stabilization in mood, thinking, and/or behavior  PRELIMINARY DISCHARGE PLAN: Outpatient therapy Participate in family therapy Return to previous living arrangement  PATIENT/FAMILY INVOLVEMENT: This treatment plan has been presented to and reviewed with the patient, Benson Settingmanda J Inks.  The patient and family have been given the opportunity to ask questions and make suggestions.  Raylene MiyamotoMichael R Deshae Dickison, RN 10/14/2017, 6:20 PM

## 2017-10-14 NOTE — Progress Notes (Signed)
Per Janet ConnJason Berry, [name] is recommended for inpt treatment. TTS to seek placement. EDP [name], Janet PicketJaime Pilcher, [name] and Janet nurse [name], [name] have been advised.  [name], [name], [name] Therapeutic Triage Specialist  [phone]

## 2017-10-14 NOTE — Discharge Instructions (Signed)
Instructions from ED provider:  Once your discharge, please have your complete blood count rechecked by your primary care provider, Dr. Fayrene FearingJames.  Your white blood cell count was elevated today, however everything else was normal, there is no signs of infection.  This is possibly related to increased body stress.  Thank you for allowing us to participate in your care today.
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
## 2017-10-14 NOTE — BHH Counselor (Signed)
[id number]. Pt can come at 1600.  Wolfgang PhoenixBrandi Denim Start, [facility] Triage Specialist

## 2017-10-15 DIAGNOSIS — F419 Anxiety disorder, unspecified: Secondary | ICD-10-CM

## 2017-10-15 DIAGNOSIS — G4700 Insomnia, unspecified: Secondary | ICD-10-CM

## 2017-10-15 DIAGNOSIS — F302 Manic episode, severe with psychotic symptoms: Principal | ICD-10-CM

## 2017-10-15 MED ORDER — NICOTINE POLACRILEX 2 MG MT GUM
2.0000 mg | CHEWING_GUM | OROMUCOSAL | Status: DC | PRN
  Administered 2017-10-15 – 2017-10-17 (×4): 2 mg via ORAL
  Filled 2017-10-15 (×2): qty 1

## 2017-10-15 MED ORDER — OLANZAPINE 5 MG PO TBDP: 5.0000 mg | ORAL_TABLET | Freq: Three times a day (TID) | ORAL | Status: DC | PRN

## 2017-10-15 MED ORDER — TRAZODONE HCL 50 MG PO TABS
50.0000 mg | ORAL_TABLET | Freq: Every evening | ORAL | Status: DC | PRN
  Administered 2017-10-15: 50 mg via ORAL
  Filled 2017-10-15 (×2): qty 1

## 2017-10-15 MED ORDER — LORAZEPAM 2 MG/ML IJ SOLN: 1.0000 mg | Freq: Four times a day (QID) | INTRAMUSCULAR | Status: DC | PRN

## 2017-10-15 MED ORDER — ARIPIPRAZOLE 15 MG PO TABS
15.0000 mg | ORAL_TABLET | Freq: Every day | ORAL | Status: DC
  Administered 2017-10-15 – 2017-10-19 (×5): 15 mg via ORAL
  Filled 2017-10-15 (×6): qty 1

## 2017-10-15 MED ORDER — CYCLOBENZAPRINE HCL 5 MG PO TABS: 5.0000 mg | ORAL_TABLET | Freq: Three times a day (TID) | ORAL | Status: DC | PRN

## 2017-10-15 MED ORDER — ZIPRASIDONE MESYLATE 20 MG IM SOLR: 20.0000 mg | Freq: Two times a day (BID) | INTRAMUSCULAR | Status: DC | PRN

## 2017-10-15 MED ORDER — LORAZEPAM 1 MG PO TABS: 1.0000 mg | ORAL_TABLET | Freq: Four times a day (QID) | ORAL | Status: DC | PRN

## 2017-10-15 MED ORDER — HYDROXYZINE HCL 50 MG PO TABS
50.0000 mg | ORAL_TABLET | Freq: Four times a day (QID) | ORAL | Status: DC | PRN
  Filled 2017-10-15: qty 1

## 2017-10-15 NOTE — Progress Notes (Signed)
Adult Psychoeducational Group Note  Date:  10/15/2017 Time:  8:53 PM  Group Topic/Focus:  Wrap-Up Group:   The focus of this group is to help patients review their daily goal of treatment and discuss progress on daily workbooks.  Participation Level:  Active  Participation Quality:  Appropriate  Affect:  Appropriate  Cognitive:  Alert  Insight: Appropriate  Engagement in Group:  Engaged  Modes of Intervention:  Discussion  Additional Comments:  Pt stated that she enjoyed going outside today. Her goal is to learn how to balance and trust herself.   Kaleen OdeaCOOKE, Roman Sandall R 10/15/2017, 8:53 PM

## 2017-10-15 NOTE — Progress Notes (Signed)
Recreation Therapy Notes  Date: 9.20.19 Time: 1000 Location: 500 Hall  Group Topic: Communication  Goal Area(s) Addresses:  Patient will effectively communicate with peers in group.  Patient will verbalize benefit of healthy communication.   Intervention: Veterinary surgeonubber Discs  Activity: Hot Lava.  Each patient was given a rubber disc and the group was given one extra disc.  Patients were to use the discs to get each person from one end of the hall to the other and back to the starting point.    Education: Communication, Discharge Planning  Education Outcome: Acknowledges understanding/In group clarification offered/Needs additional education.   Clinical Observations/Feedback:  Pt did not attend group.     Caroll RancherMarjette Hever Castilleja, LRT/CTRS         Lillia AbedLindsay, Jerrilynn Mikowski A 10/15/2017 1:07 PM
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
## 2017-10-15 NOTE — Progress Notes (Addendum)
Nursing Progress Note: 7p-7a D: Pt currently presents with a anxious/preoccuppied/paranoia/presecutory affect and behavior. Interacting minimally with the milieu. Pt reports good sleep during the previous night with current medication regimen. Pt did attend wrap-up group.  A: Pt refused PRN medications. Pt's labs and vitals were monitored throughout the night. Pt supported emotionally and encouraged to express concerns and questions. Pt educated on medications.  R: Pt's safety ensured with 15 minute and environmental checks. Pt currently denies SI, HI, and AVH. Pt verbally contracts to seek staff if SI,HI, or AVH occurs and to consult with staff before acting on any harmful thoughts. Will continue to monitor.

## 2017-10-15 NOTE — Tx Team (Signed)
Interdisciplinary Treatment and Diagnostic Plan Update  10/15/2017 Time of Session: 3:27 PM  [name] MRN: [id number]  Principal Diagnosis: Bipolar I disorder, single manic episode, severe, with psychosis ([place])  Secondary Diagnoses: Principal Problem:   Bipolar I disorder, single manic episode, severe, with psychosis ([place])   Current Medications:  Current Facility-Administered Medications  Medication Dose Route Frequency Provider Last Rate Last Dose  . acetaminophen (TYLENOL) tablet 650 mg  650 mg Oral Q6H PRN Ethelene Hal, NP      . alum & mag hydroxide-simeth (MAALOX/MYLANTA) 200-200-20 MG/5ML suspension 30 mL  30 mL Oral Q4H PRN Ethelene Hal, NP      . ARIPiprazole (ABILIFY) tablet 15 mg  15 mg Oral Daily Pennelope Bracken, MD      . cyclobenzaprine (FLEXERIL) tablet 5 mg  5 mg Oral TID PRN Pennelope Bracken, MD      . escitalopram (LEXAPRO) tablet 10 mg  10 mg Oral Daily Ethelene Hal, NP   10 mg at 10/15/17 0759  . hydrOXYzine (ATARAX/VISTARIL) tablet 50 mg  50 mg Oral Q6H PRN Pennelope Bracken, MD      . LORazepam (ATIVAN) tablet 1 mg  1 mg Oral Q6H PRN Pennelope Bracken, MD       Or  . LORazepam (ATIVAN) injection 1 mg  1 mg Intramuscular Q6H PRN Pennelope Bracken, MD      . magnesium hydroxide (MILK OF MAGNESIA) suspension 30 mL  30 mL Oral Daily PRN Ethelene Hal, NP      . nicotine (NICODERM CQ - dosed in mg/24 hours) patch 21 mg  21 mg Transdermal Daily [place], Christopher T, MD      . OLANZapine zydis (ZYPREXA) disintegrating tablet 5 mg  5 mg Oral Q8H PRN Pennelope Bracken, MD       Or  . ziprasidone (GEODON) injection 20 mg  20 mg Intramuscular Q12H PRN Pennelope Bracken, MD      . traZODone (DESYREL) tablet 50 mg  50 mg Oral QHS PRN,MR X 1 [place], Randa Ngo, MD        PTA Medications: Medications Prior to Admission  Medication Sig Dispense Refill Last Dose  . albuterol  (PROVENTIL HFA;VENTOLIN HFA) 108 (90 Base) MCG/ACT inhaler Inhale 2 puffs into the lungs every 6 (six) hours as needed for wheezing or shortness of breath. 1 Inhaler 2 unk  . amphetamine-dextroamphetamine (ADDERALL XR) 20 MG 24 hr capsule Take 1 capsule (20 mg total) by mouth every morning. 30 capsule 0 Past Week at Unknown time  . amphetamine-dextroamphetamine (ADDERALL) 10 MG tablet 1 tab by mouth daily about 2 PM (Patient taking differently: Take 10 mg by mouth daily. ) 30 tablet 0 Past Week at Unknown time  . clonazePAM (KLONOPIN) 0.5 MG tablet TAKE 1 - 2 TABLET BY MOUTH EVERY DAY AS NEEDED FOR ANXIETY (Patient taking differently: Take 0.25-0.5 mg by mouth daily as needed for anxiety. ) 60 tablet 5 unk  . cyclobenzaprine (FLEXERIL) 5 MG tablet TAKE 1 TABLET BY MOUTH 3 TIMES A DAY AS NEEDED FOR MUSCLE SPASMS (Patient taking differently: Take 5 mg by mouth 3 (three) times daily as needed for muscle spasms. ) 60 tablet 2 Past Month at Unknown time  . escitalopram (LEXAPRO) 10 MG tablet TAKE 1 TABLET BY MOUTH EVERY DAY (Patient taking differently: Take 10 mg by mouth daily. ) 90 tablet 1 Past Month at Unknown time  . levonorgestrel-ethinyl estradiol (VIENVA) 0.1-20 MG-MCG tablet  Take 1 tablet by mouth daily.   Past Week at Unknown time  . meloxicam (MOBIC) 15 MG tablet TAKE 1 TABLET BY MOUTH EVERY DAY (Patient not taking: Reported on 10/14/2017) 30 tablet 3 Not Taking at Unknown time    Patient Stressors: Health problems Medication change or noncompliance Substance abuse  Patient Strengths: Ability for insight Capable of independent living General fund of knowledge Supportive family/friends  Treatment Modalities: Medication Management, Group therapy, Case management,  1 to 1 session with clinician, Psychoeducation, Recreational therapy.   Physician Treatment Plan for Primary Diagnosis: Bipolar I disorder, single manic episode, severe, with psychosis ([place]) Long Term Goal(s): Improvement in  symptoms so as ready for discharge  Short Term Goals: Ability to identify and develop effective coping behaviors will improve Ability to maintain clinical measurements within normal limits will improve  Medication Management: Evaluate patient's response, side effects, and tolerance of medication regimen.  Therapeutic Interventions: 1 to 1 sessions, Unit Group sessions and Medication administration.  Evaluation of Outcomes: Progressing  Physician Treatment Plan for Secondary Diagnosis: Principal Problem:   Bipolar I disorder, single manic episode, severe, with psychosis ([place])   Long Term Goal(s): Improvement in symptoms so as ready for discharge  Short Term Goals: Ability to identify and develop effective coping behaviors will improve Ability to maintain clinical measurements within normal limits will improve  Medication Management: Evaluate patient's response, side effects, and tolerance of medication regimen.  Therapeutic Interventions: 1 to 1 sessions, Unit Group sessions and Medication administration.  Evaluation of Outcomes: Progressing   RN Treatment Plan for Primary Diagnosis: Bipolar I disorder, single manic episode, severe, with psychosis ([place]) Long Term Goal(s): Knowledge of disease and therapeutic regimen to maintain health will improve  Short Term Goals: Ability to identify and develop effective coping behaviors will improve and Compliance with prescribed medications will improve  Medication Management: RN will administer medications as ordered by provider, will assess and evaluate patient's response and provide education to patient for prescribed medication. RN will report any adverse and/or side effects to prescribing provider.  Therapeutic Interventions: 1 on 1 counseling sessions, Psychoeducation, Medication administration, Evaluate responses to treatment, Monitor vital signs and CBGs as ordered, Perform/monitor CIWA, COWS, AIMS and Fall Risk screenings as ordered,  Perform wound care treatments as ordered.  Evaluation of Outcomes: Progressing   LCSW Treatment Plan for Primary Diagnosis: Bipolar I disorder, single manic episode, severe, with psychosis ([place]) Long Term Goal(s): Safe transition to appropriate next level of care at discharge, Engage patient in therapeutic group addressing interpersonal concerns.  Short Term Goals: Engage patient in aftercare planning with referrals and resources  Therapeutic Interventions: Assess for all discharge needs, 1 to 1 time with Social worker, Explore available resources and support systems, Assess for adequacy in community support network, Educate family and significant other(s) on suicide prevention, Complete Psychosocial Assessment, Interpersonal group therapy.  Evaluation of Outcomes: Met  Return home, follow up outpt   Progress in Treatment: Attending groups: Yes Participating in groups: Yes Taking medication as prescribed: Yes Toleration medication: Yes, no side effects reported at this time Family/Significant other contact made: No Patient understands diagnosis: No Limited insight. Discussing patient identified problems/goals with staff: Yes Medical problems stabilized or resolved: Yes Denies suicidal/homicidal ideation: Yes Issues/concerns per patient self-inventory: None Other: N/A  New problem(s) identified: None identified at this time.   New Short Term/Long Term Goal(s): "Goals for me personally?  Make sure that everyone outside of here understands I just want to peacefully co-exist.  I  want to spend more time with my biological family. I feel like being here I am being delayed in my community and my development."   Discharge Plan or Barriers:   Reason for Continuation of Hospitalization: Paranoia Medication stabilization   Estimated Length of Stay: 9/25  Attendees: Patient: [name] 10/15/2017  3:27 PM  Physician: Maris Berger, MD 10/15/2017  3:27 PM  Nursing: Sena Hitch, RN 10/15/2017  3:27 PM  RN Care Manager: Lars Pinks, RN 10/15/2017  3:27 PM  Social Worker: Ripley Fraise 10/15/2017  3:27 PM  Recreational Therapist: Winfield Cunas 10/15/2017  3:27 PM  Other: Norberto Sorenson 10/15/2017  3:27 PM  Other:  10/15/2017  3:27 PM    Scribe for Treatment Team:  Roque Lias LCSW 10/15/2017 3:27 PM

## 2017-10-15 NOTE — Plan of Care (Signed)
Problem: Coping: Goal: Ability to identify and develop effective coping behavior will improve Outcome: Progressing   Problem: Nutritional: Goal: Ability to achieve adequate nutritional intake will improve Outcome: Progressing   Problem: Safety: Goal: Ability to remain free from injury will improve Outcome: Progressing D: Pt awake in dayroom at intervals during shift. Presents paranoid, suspicious of others "I don't trust her, I want her to be safe and me to be safe too, I can't be around people right now" in regards to her room mate. Observed in bathroom for period at a time, would not let room mate in. Rates her depression 2/10, hopelessness 0/10 and anxiety 5/10. Reports she slept fairly last night with good appetite, low energy and poor concentration level. Pt's goal this shift "getting to know the operations of this program".  A: Scheduled medications given per order with verbal education and effects monitored. Emotional support provided to pt as needed. Encouraged pt to voice concerns, attend to ADLs and comply with treatment regimen including groups. Safety checks maintained without self harm gestures or outburst.  R: Pt receptive to care. Compliant with medications when offered. Denies adverse drug reactions. Tolerates all PO intake well. POC maintained for safety and mood stability.

## 2017-10-15 NOTE — [facility] Group Notes (Signed)
LCSW Group Therapy Note  10/15/2017 1:15pm  Type of Therapy/Topic:  Group Therapy:  Feelings about Diagnosis  Participation Level:  Did Not Attend   Description of Group:   This group will allow patients to explore their thoughts and feelings about diagnoses they have received. Patients will be guided to explore their level of understanding and acceptance of these diagnoses. Facilitator will encourage patients to process their thoughts and feelings about the reactions of others to their diagnosis and will guide patients in identifying ways to discuss their diagnosis with significant others in their lives. This group will be process-oriented, with patients participating in exploration of their own experiences, giving and receiving support, and processing challenge from other group members.   Therapeutic Goals: 1. Patient will demonstrate understanding of diagnosis as evidenced by identifying two or more symptoms of the disorder 2. Patient will be able to express two feelings regarding the diagnosis 3. Patient will demonstrate their ability to communicate their needs through discussion and/or role play  Summary of Patient Progress:       Therapeutic Modalities:   Cognitive Behavioral Therapy Brief Therapy Feelings Identification    Ida RogueRodney B Gwendoline Judy, LCSW 10/15/2017 3:26 PM
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
## 2017-10-16 DIAGNOSIS — F17210 Nicotine dependence, cigarettes, uncomplicated: Secondary | ICD-10-CM

## 2017-10-16 MED ORDER — ESCITALOPRAM OXALATE 20 MG PO TABS
20.0000 mg | ORAL_TABLET | Freq: Every day | ORAL | Status: DC
  Administered 2017-10-17 – 2017-10-19 (×3): 20 mg via ORAL
  Filled 2017-10-16 (×4): qty 1

## 2017-10-16 MED ORDER — POTASSIUM CHLORIDE CRYS ER 20 MEQ PO TBCR
20.0000 meq | EXTENDED_RELEASE_TABLET | Freq: Two times a day (BID) | ORAL | Status: AC
  Administered 2017-10-16 – 2017-10-17 (×3): 20 meq via ORAL
  Filled 2017-10-16 (×3): qty 1

## 2017-10-16 MED ORDER — ONDANSETRON 4 MG PO TBDP
4.0000 mg | ORAL_TABLET | Freq: Three times a day (TID) | ORAL | Status: DC | PRN
  Administered 2017-10-16 – 2017-10-17 (×2): 4 mg via ORAL
  Filled 2017-10-16 (×2): qty 1

## 2017-10-16 MED ORDER — BENZONATATE 100 MG PO CAPS: 100.0000 mg | ORAL_CAPSULE | Freq: Three times a day (TID) | ORAL | Status: DC | PRN

## 2017-10-16 NOTE — [facility] Group Notes (Signed)
  [facility] LCSW Group Therapy Note  Date/Time:  10/16/2017 11:15AM-12:00PM  Type of Therapy and Topic:  Group Therapy:  Feelings About Hospitalization  Participation Level:  Minimal   Description of Group This process group involved patients discussing their feelings related to being hospitalized, as well as the benefits they see to being in the hospital.  These feelings and benefits were itemized.  The group then brainstormed specific ways in which they could seek those same benefits when they discharge and return home.  Therapeutic Goals 1. Patient will identify and describe positive and negative feelings related to hospitalization 2. Patient will verbalize benefits of hospitalization to themselves personally 3. Patients will brainstorm together ways they can obtain similar benefits in the outpatient setting, identify barriers to wellness and possible solutions  Summary of Patient Progress:  The patient expressed her primary feelings about being hospitalized are "it's a great place but I'm used to managing myself, like eating when I want and using the phone when I want.  People are on a different schedule for these things."  She left the room multiple times and said that was to "manage myself."  Therapeutic Modalities Cognitive Behavioral Therapy Motivational Interviewing    Ambrose MantleMareida Grossman-Orr, LCSW 10/16/2017, 8:04 AM  ;

## 2017-10-16 NOTE — [facility] Group Notes (Signed)
[facility] Group Notes:  (Nursing/MHT/Case Management/Adjunct)  Date:  10/16/2017  Time:  11:08 AM  Type of Therapy:  Psychoeducational Skills  Participation Level:  Active  Participation Quality:  Appropriate and Attentive  Affect:  Appropriate  Cognitive:  Alert and Appropriate  Insight:  Appropriate and Good  Engagement in Group:  Engaged  Modes of Intervention:  Discussion  Summary of Progress/Problems:  Discussed Journey to recovery.  Patient was attentive, contributed and receptive.  Audrie Lializabeth O Tawona Filsinger 10/16/2017, 11:08 AM

## 2017-10-16 NOTE — Plan of Care (Signed)
  Problem: Education: Goal: Emotional status will improve Outcome: Progressing   Problem: Activity: Goal: Interest or engagement in activities will improve Outcome: Progressing   Problem: Safety: Goal: Periods of time without injury will increase Outcome: Progressing  DAR NOTE: Patient presents with anxious affect and depressed mood.  Denies suicidal thoughts, pain, auditory and visual hallucinations.  Described energy level as low and concentration as good.  Rates depression at 5, hopelessness at 0, and anxiety at 5.  Maintained on routine safety checks.  Medications given as prescribed.  Support and encouragement offered as needed.  Attended group and participated.  States goal for today is "focus on coping skills for my relationships."  Patient visible in milieu with minimal interactions.  Zofran 4 mg given for complain of nausea with good effect.

## 2017-10-16 NOTE — Progress Notes (Signed)
D: When asked about her day pt stated, "alright". Asked if she had attended groups, pt stated, "I try to peep in on groups, but I don't stay through all of it because I get sleepy and need to lay down". Pt informed the writer that she was "worried about everybody". Asked the writer about several of her peers and asking "what's going on with them". Pt is still paranoid and thinks people are following her. Pt has no questions or concerns.   A:  Support and encouragement was offered. 15 min checks continued for safety.  R: Pt remains safe.
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
## 2017-10-17 NOTE — Progress Notes (Signed)
Adult Psychoeducational Group Note  Date:  10/17/2017 Time:  2:04 AM  Group Topic/Focus:  Wrap-Up Group:   The focus of this group is to help patients review their daily goal of treatment and discuss progress on daily workbooks.  Participation Level:  Active  Participation Quality:  Appropriate and Sharing  Affect:  Blunted and Flat  Cognitive:  Alert and Appropriate  Insight: Appropriate  Engagement in Group:  Engaged  Modes of Intervention:  Discussion  Additional Comments:  When asked pt stated, she had a "good day around a 8/10". Stated her goal was to rest. Stated, she "thought they were going outside but instead they went to the gym"the patient stated she doesn't usually have good experiences with gyms but did this time.   Fransico MichaelBrooks, Weronika Birch Laverne 10/17/2017, 2:04 AM

## 2017-10-17 NOTE — [facility] Group Notes (Signed)
[facility] Group Notes:  (Nursing/MHT/Case Management/Adjunct)  Date:  10/17/2017  Time:  10:29 AM  Type of Therapy:  Patient self inventory group and goals group  Participation Level:  Active  Participation Quality:  Inattentive  Affect:  Anxious and Resistant  Cognitive:  Disorganized  Insight:  Lacking  Engagement in Group:  Lacking  Modes of Intervention:  Education  Summary of Progress/Problems: Pt did attend patient self inventory group and goals group.   Elenora Hawbaker Shanta 10/17/2017, 10:29 AM 

## 2017-10-17 NOTE — Progress Notes (Signed)
Patient ID: [name], female   DOB: 04/17/1980, 37 y.o.   MRN: [id number]    D: Pt has been very paranoid on the unit today, she has been very concerned about her medication and her code number. This Clinical research associatewriter explained to patient that her code was private and that staff would not give her code to no one. Pt reported that her depression was a 3, her hopelessness was a 0, and her anxiety was a 5. Pt reported that her goal for today was to spend time in a clear state. Pt reported being negative SI/HI, no AH/VH noted. A: 15 min checks continued for patient safety. R: Pt safety maintained.

## 2017-10-17 NOTE — [facility] Group Notes (Signed)
[facility]  Date/Time:  10/17/2017  11:00AM-12:00PM  Type of Therapy and Topic:  Group Therapy:  Music and Mood  Participation Level:  Active   Description of Group: In this process group, members listened to a variety of genres of music and identified that different types of music evoke different responses.  Patients were encouraged to identify music that was soothing for them and music that was energizing for them.  Patients discussed how this knowledge can help with wellness and recovery in various ways including managing depression and anxiety as well as encouraging healthy sleep habits.    Therapeutic Goals: 1. Patients will explore the impact of different varieties of music on mood 2. Patients will verbalize the thoughts they have when listening to different types of music 3. Patients will identify music that is soothing to them as well as music that is energizing to them 4. Patients will discuss how to use this knowledge to assist in maintaining wellness and recovery 5. Patients will explore the use of music as a coping skill  Summary of Patient Progress:  At the beginning of group, patient expressed that she felt confused and unsure, also reporting that anxiety was "coming and going."  She talked quite a bit about a variety of emotions evoked by various songs.  She did come and go throughout group but not as much as yesterday.  At the end of group she said she felt "better."  Therapeutic Modalities: Solution Focused Brief Therapy Activity   Ambrose MantleMareida Grossman-Orr, LCSW

## 2017-10-17 NOTE — [facility] Group Notes (Signed)
[facility] Group Notes:  (Nursing/MHT/Case Management/Adjunct)  Date:  10/17/2017  Time:  10:33 AM  Type of Therapy:  Psychoeducational Skills  Participation Level:  Minimal  Participation Quality:  Resistant  Affect:  Resistant  Cognitive:  Disorganized  Insight:  Lacking  Engagement in Group:  Lacking  Modes of Intervention:  Problem-solving  Summary of Progress/Problems: Pt attended Psychoeducational group with topic healthy support system.    Jacquelyne BalintForrest, Hannahgrace Lalli Shanta 10/17/2017, 10:33 AM

## 2017-10-17 NOTE — [facility] Group Notes (Signed)
[facility] Group Notes:  (Nursing/MHT/Case Management/Adjunct)  Date:  10/17/2017  Time:  2:42 PM  Type of Therapy:  Psychoeducational Skills  Participation Level:  Did Not Attend  Participation Quality:  Did not attend  Affect:  Did not attend  Cognitive:  Did not attend  Insight:  None  Engagement in Group:  Did not attend  Modes of Intervention:  Did not attend  Summary of Progress/Problems: Pt did not attend Psychoeducational group with topic healthy support systems.    Jacquelyne BalintForrest, Sumi Lye Shanta 10/17/2017, 2:42 PM
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
## 2017-10-17 NOTE — Progress Notes (Signed)
D: Stated, she had a "good day", however remains with a flat affect. Informed the writer that she went to the gym today, but was hoping to go outside. Denies all. Pt has no questions or concerns.    A:  Support and encouragement was offered. 15 min checks continued for safety.  R: Pt remains safe.

## 2017-10-18 NOTE — Progress Notes (Signed)
D: Pt denies SI/HI/AVH. Pt is pleasant and cooperative. Pt stated she was doing better . Pt visible on the unit at times this evening, pt appears paranoid, but will interact when engaged.   A: Pt was offered support and encouragement. Pt was given scheduled medications. Pt was encourage to attend groups. Q 15 minute checks were done for safety.   R:Pt attends groups and interacts well with peers and staff. Pt is taking medication. Pt has no complaints.Pt receptive to treatment and safety maintained on unit.  Problem: Activity: Goal: Sleeping patterns will improve Outcome: Progressing   Problem: Education: Goal: Mental status will improve Outcome: Progressing   Problem: Education: Goal: Emotional status will improve Outcome: Progressing

## 2017-10-18 NOTE — Progress Notes (Signed)
Adult Psychoeducational Group Note  Date:  10/18/2017 Time:  9:05 PM  Group Topic/Focus:  Wrap-Up Group:   The focus of this group is to help patients review their daily goal of treatment and discuss progress on daily workbooks.  Participation Level:  Active  Participation Quality:  Appropriate  Affect:  Appropriate  Cognitive:  Appropriate  Insight: Appropriate  Engagement in Group:  Engaged  Modes of Intervention:  Discussion  Additional Comments: The patient expressed that she rates today a 10.The patient also said that attend ed groups and reached her goals.  Octavio Mannshigpen, Veria Stradley Lee 10/18/2017, 9:05 PM
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
## 2017-10-18 NOTE — Progress Notes (Signed)
Recreation Therapy Notes  Date: 9.23.19 Time: 1000 Location: [address] Dayroom  Group Topic: Coping Skills  Goal Area(s) Addresses:  Patient will identify positive coping skills.   Patient will identify benefits of using positive coping skills post d/c.  Intervention: Worksheet, white board, dry erase marker, pencils  Activity: Mind map.  LRT and patients filled in the first 8 boxes together on the mind map with anger, speech, lack of focus, depression, anxiety, self harm thoughts, finances, bi-polar/medical diagnosis.  Patients were to them come up with 3 coping skills individually for each situation.  The group would come back together and LRT would write the coping skills on the board.  Education: PharmacologistCoping Skills, Building control surveyorDischarge Planning.   Education Outcome: Acknowledges understanding/In group clarification offered/Needs additional education.   Clinical Observations/Feedback: Pt did not attend group.    Caroll RancherMarjette Tadan Shill, LRT/CTRS         Caroll RancherLindsay, Ibrahim Mcpheeters A 10/18/2017 11:54 AM

## 2017-10-18 NOTE — Progress Notes (Signed)
Patient ID: [name], female   DOB: 06/07/1980, 37 y.o.   MRN: [id number]  D: Pt with blunted affect, depressed mood, denies SI/HI/AVH, reported feeling dizzy at start of shift, was given some Gatorade, HR was 128 as recorded earlier by previous shift, and was 101 with B/P of 124/59 when repeated.  Pt was encouraged to drink fluids and verbalized understanding.  Pt rates her depression as 4 (10 being the worst), rates her hopelessness level as 4 (10 being the worst), and rates her anxiety level as 4 (10 being the worst).  Pt has been staying reclusive to her room.  A: Pt given all meds as scheduled, and is being maintained on Q15 minute checks for safety.  R: Will continue to monitor on Q15 minute checks, and will continue to monitor.

## 2017-10-18 NOTE — [facility] Group Notes (Signed)
LCSW Group Therapy Note   10/18/2017 1:15pm   Type of Therapy and Topic:  Group Therapy:  Overcoming Obstacles   Participation Level:  Active   Description of Group:    In this group patients will be encouraged to explore what they see as obstacles to their own wellness and recovery. They will be guided to discuss their thoughts, feelings, and behaviors related to these obstacles. The group will process together ways to cope with barriers, with attention given to specific choices patients can make. Each patient will be challenged to identify changes they are motivated to make in order to overcome their obstacles. This group will be process-oriented, with patients participating in exploration of their own experiences as well as giving and receiving support and challenge from other group members.   Therapeutic Goals: 1. Patient will identify personal and current obstacles as they relate to admission. 2. Patient will identify barriers that currently interfere with their wellness or overcoming obstacles.  3. Patient will identify feelings, thought process and behaviors related to these barriers. 4. Patient will identify two changes they are willing to make to overcome these obstacles:      Summary of Patient Progress In and out a couple of times, engaged when present.  Talked about needing to learn to set limits with others and "say no."  Stated she also needs a better work/life balance, and identified multiple things she can do to take care of herself that also bring her joy.     Therapeutic Modalities:   Cognitive Behavioral Therapy Solution Focused Therapy Motivational Interviewing Relapse Prevention Therapy  Ida RogueRodney B Channing Savich, LCSW 10/18/2017 3:14 PM

## 2017-10-18 NOTE — [facility] Group Notes (Signed)
DID NOT ATTEND 

## 2017-10-18 NOTE — [facility] Suicide Risk Assessment (Signed)
[facility] INPATIENT:  Family/Significant Other Suicide Prevention Education  Suicide Prevention Education:  Education Completed; No one has been identified by the patient as the family member/significant other with whom the patient will be residing, and identified as the person(s) who will aid the patient in the event of a mental health crisis (suicidal ideations/suicide attempt).  With written consent from the patient, the family member/significant other has been provided the following suicide prevention education, prior to the and/or following the discharge of the patient.  The suicide prevention education provided includes the following:  Suicide risk factors  Suicide prevention and interventions  National Suicide Hotline telephone number  [facility] assessment telephone number  [facility] Emergency Assistance 911  [facility] and/or Residential Mobile Crisis Unit telephone number  Request made of family/significant other to:  Remove weapons (e.g., guns, rifles, knives), all items previously/currently identified as safety concern.    Remove drugs/medications (over-the-counter, prescriptions, illicit drugs), all items previously/currently identified as a safety concern.  The family member/significant other verbalizes understanding of the suicide prevention education information provided.  The family member/significant other agrees to remove the items of safety concern listed above. The patient did not endorse SI at the time of admission, nor did the patient c/o SI during the stay here.  SPE not required.   [name] 10/18/2017, 1:17 PM

## 2017-10-18 NOTE — Progress Notes (Signed)
D: Pt continues to focus at times on one of her peers. Wondering what was wrong with the pt, and wanting to make sure the pt was "ok". Writer assured pt that staff was here to accommodate all pts. Informed the writer that she wasn't feeling well earlier.  Pt has no questions or concerns.    A:  Support and encouragement was offered. 15 min checks continued for safety.  R: Pt remains safe.

## 2017-10-19 MED ORDER — MELOXICAM 15 MG PO TABS: 15.0000 mg | ORAL_TABLET | Freq: Every day | ORAL | 3 refills | Status: DC

## 2017-10-19 MED ORDER — NICOTINE POLACRILEX 2 MG MT GUM: 2.0000 mg | CHEWING_GUM | OROMUCOSAL | 0 refills | Status: DC | PRN

## 2017-10-19 MED ORDER — ALBUTEROL SULFATE HFA 108 (90 BASE) MCG/ACT IN AERS: 2.0000 | INHALATION_SPRAY | Freq: Four times a day (QID) | RESPIRATORY_TRACT | 2 refills | Status: DC | PRN

## 2017-10-19 MED ORDER — HYDROXYZINE HCL 50 MG PO TABS: 50.0000 mg | ORAL_TABLET | Freq: Four times a day (QID) | ORAL | 0 refills | Status: DC | PRN

## 2017-10-19 MED ORDER — ARIPIPRAZOLE 15 MG PO TABS: 15.0000 mg | ORAL_TABLET | Freq: Every day | ORAL | 0 refills | Status: DC

## 2017-10-19 MED ORDER — TRAZODONE HCL 50 MG PO TABS: 50.0000 mg | ORAL_TABLET | Freq: Every evening | ORAL | 0 refills | Status: DC | PRN

## 2017-10-19 MED ORDER — ESCITALOPRAM OXALATE 20 MG PO TABS: 20.0000 mg | ORAL_TABLET | Freq: Every day | ORAL | 0 refills | Status: DC

## 2017-10-19 MED ORDER — CYCLOBENZAPRINE HCL 5 MG PO TABS: 5.0000 mg | ORAL_TABLET | Freq: Three times a day (TID) | ORAL | 0 refills | Status: DC | PRN

## 2017-10-19 NOTE — Progress Notes (Signed)
Recreation Therapy Notes  INPATIENT RECREATION TR PLAN  Patient Details Name: [name] MRN: [id number] DOB: 1980/04/15 Today's Date: 10/19/2017  Rec Therapy Plan Is patient appropriate for Therapeutic Recreation?: Yes Treatment times per week: about 3 days Estimated Length of Stay: 5-7 days TR Treatment/Interventions: Group participation (Comment)  Discharge Criteria Pt will be discharged from therapy if:: Discharged Treatment plan/goals/alternatives discussed and agreed upon by:: Patient/family  Discharge Summary Short term goals set: See patient care plan Short term goals met: Adequate for discharge Progress toward goals comments: Groups attended Which groups?: Wellness Reason goals not met: Pt attended one group. Therapeutic equipment acquired: N/A Reason patient discharged from therapy: Discharge from hospital Pt/family agrees with progress & goals achieved: Yes Date patient discharged from therapy: 10/19/17    Victorino Sparrow, LRT/CTRS   Ria Comment, Zaydn Gutridge A 10/19/2017, 12:03 PM
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
## 2017-10-19 NOTE — Plan of Care (Signed)
Pt was able to focus and stay on task at completion of recreation therapy group session.   Caroll RancherMarjette Andreina Outten, LRT/CTRS

## 2017-10-19 NOTE — Progress Notes (Signed)
Recreation Therapy Notes  Date: 9.24.19 Time: 1000 Location: 500 Hall Dayroom  Group Topic: Wellness  Goal Area(s) Addresses:  Patient will define components of whole wellness. Patient will verbalize benefit of whole wellness.  Behavioral Response: Engaged  Intervention:  Exercise, music  Activity: Exercise.  LRT lead patients in a series of stretches before going into the exercises.  Once stretched, each patient had the opportunity to lead the group in an exercise (wall push ups, squats, toe raises, etc).  The group completed 3 rounds of exercises before doing a cool down.  Education: Wellness, Building control surveyorDischarge Planning.   Education Outcome: Acknowledges education/In group clarification offered/Needs additional education.   Clinical Observations/Feedback: Pt completed the exercises.  Pt was bright and engaged during group.  Pt seemed to be enjoying the exercises.  Pt was appropriate and stayed for the entire group.    Caroll RancherMarjette Breyonna Nault, LRT/CTRS      Caroll RancherLindsay, Kayliana Codd A 10/19/2017 11:41 AM

## 2017-10-19 NOTE — Progress Notes (Signed)
  [facility] :  Will you be returning to the same living situation after discharge:  Yes,  home At discharge, do you have transportation home?: Yes,  has money for [employer] Do you have the ability to pay for your medications: Yes,  insurance  Release of information consent forms completed and in the chart;  Patient's signature needed at discharge.  Patient to Follow up at: Follow-up Information    [facility] on 10/26/2017.   Why:  Tuesday at1:00 with Dr Jannifer FranklinAkintayo for your hospital follow up appointment.  Then, Thursday, October 24 at [facility] with [place] for therapy. Contact information: [address] [place] [address] [phone]           Next level of care provider [facility]:no  Safety Planning and Suicide Prevention discussed: Yes,  yes     Has patient been referred to the Quitline?: N/A patient is not a smoker  Patient has been referred for addiction treatment: N/A  Ida RogueRodney B Fritzi Scripter, LCSW 10/19/2017, 8:41 AM

## 2017-10-19 NOTE — Progress Notes (Signed)
Patient discharged to lobby. Patient was stable and appreciative at that time. All papers and prescriptions were given and valuables returned. Verbal understanding expressed. Denies SI/HI and A/VH. Patient given opportunity to express concerns and ask questions.  

## 2017-10-21 ENCOUNTER — Telehealth: Payer: Self-pay | Admitting: *Deleted

## 2017-10-21 NOTE — Telephone Encounter (Signed)
Pt was on TCM report admitted 10/14/17 for Worsening paranoia & delusional thinking. was medically cleared and then transferred to [facility] for additional treatment and stabilization.She was started on trial of abilify and discontinued from klonopin. Pt D/C 10/19/17, and will follow-up w/ [facility] on 10/26/2017; Tuesday at1:00 with Dr Jannifer Franklin.Marland KitchenRaechel Chute

## 2017-10-29 DIAGNOSIS — N9489 Other specified conditions associated with female genital organs and menstrual cycle: Secondary | ICD-10-CM | POA: Diagnosis not present

## 2017-10-29 DIAGNOSIS — N920 Excessive and frequent menstruation with regular cycle: Secondary | ICD-10-CM | POA: Diagnosis not present

## 2017-10-29 DIAGNOSIS — Z01818 Encounter for other preprocedural examination: Secondary | ICD-10-CM | POA: Diagnosis not present

## 2017-11-04 ENCOUNTER — Encounter (HOSPITAL_COMMUNITY): Payer: Self-pay | Admitting: *Deleted

## 2017-11-04 ENCOUNTER — Other Ambulatory Visit: Payer: Self-pay

## 2017-11-11 ENCOUNTER — Encounter: Payer: Self-pay | Admitting: Internal Medicine

## 2017-11-11 ENCOUNTER — Ambulatory Visit: Payer: BLUE CROSS/BLUE SHIELD | Admitting: Internal Medicine

## 2017-11-11 VITALS — BP 120/84 | HR 85 | Temp 98.4°F | Ht 69.25 in | Wt 233.0 lb

## 2017-11-11 DIAGNOSIS — F329 Major depressive disorder, single episode, unspecified: Secondary | ICD-10-CM

## 2017-11-11 DIAGNOSIS — Z23 Encounter for immunization: Secondary | ICD-10-CM | POA: Diagnosis not present

## 2017-11-11 DIAGNOSIS — F32A Depression, unspecified: Secondary | ICD-10-CM

## 2017-11-11 DIAGNOSIS — R739 Hyperglycemia, unspecified: Secondary | ICD-10-CM

## 2017-11-11 DIAGNOSIS — F988 Other specified behavioral and emotional disorders with onset usually occurring in childhood and adolescence: Secondary | ICD-10-CM | POA: Diagnosis not present

## 2017-11-11 MED ORDER — AMPHETAMINE-DEXTROAMPHETAMINE 10 MG PO TABS: 10.0000 mg | ORAL_TABLET | Freq: Every day | ORAL | 0 refills | Status: DC

## 2017-11-11 MED ORDER — AMPHETAMINE-DEXTROAMPHETAMINE 20 MG PO TABS: 20.0000 mg | ORAL_TABLET | Freq: Every day | ORAL | 0 refills | Status: DC

## 2017-11-11 NOTE — Assessment & Plan Note (Signed)
Ok for restart adderal 20 am, 10 pm,  to f/u any worsening symptoms or concerns

## 2017-11-11 NOTE — Progress Notes (Signed)
Subjective:    Patient ID: [name], female    DOB: 06/21/1980, 37 y.o.   MRN: [id number]  HPI Here after recent psychiatric hospn, bipolar with psychosis episode, improved overall, Denies worsening depressive symptoms, suicidal ideation, or panic; has ongoing anxiety.  Sleeping better with trazodone.  Believes someone is following her.  More family stress recently as she is living with her mother and step father to help them.  Seeing therapy as well and feels is helping.  Having fatigue and wt gain on abilify  Has had adderall xr 20 at home, asks to restart with the non ER version this time.  Has psychiatric f/u appt next month Past Medical History:  Diagnosis Date  . ADD 10/22/2009  . ALLERGIC RHINITIS 09/26/2008  . Anemia   . ANXIETY 09/26/2008  . Asthma   . CONSTIPATION 09/26/2008  . DEPRESSION 09/26/2008  . FATIGUE 01/14/2009  . Headache   . HYPERLIPIDEMIA 09/26/2008  . OTITIS MEDIA, LEFT 01/14/2009  . SKIN LESION 09/26/2008  . Sleep apnea   . TONSILLITIS, ACUTE 01/14/2009  . Unconscious state (HCC) 10/13/2017  . URI 10/22/2009   Past Surgical History:  Procedure Laterality Date  . CERVICAL POLYPECTOMY  2009  . extraction of wisdom teeth     error in charting  . polyp removal       reports that she has been smoking cigarettes. She has a 10.00 pack-year smoking history. She has never used smokeless tobacco. She reports that she drank alcohol. She reports that she does not use drugs. family history includes Diabetes in her other; Hypertension in her mother and other; Thyroid disease in her mother. No Known Allergies  Review of Systems  Constitutional: Negative for other unusual diaphoresis or sweats HENT: Negative for ear discharge or swelling Eyes: Negative for other worsening visual disturbances Respiratory: Negative for stridor or other swelling  Gastrointestinal: Negative for worsening distension or other blood Genitourinary: Negative for retention or other urinary  change Musculoskeletal: Negative for other MSK pain or swelling Skin: Negative for color change or other new lesions Neurological: Negative for worsening tremors and other numbness  Psychiatric/Behavioral: Negative for worsening agitation or other fatigue All other system neg per pt    Objective:   Physical Exam BP 120/84   Pulse 85   Temp 98.4 F (36.9 C) (Oral)   Ht 5' 9.25" (1.759 m)   Wt 233 lb (105.7 kg)   SpO2 96%   BMI 34.16 kg/m  VS noted,  Constitutional: Pt appears in NAD HENT: Head: NCAT.  Right Ear: External ear normal.  Left Ear: External ear normal.  Eyes: . Pupils are equal, round, and reactive to light. Conjunctivae and EOM are normal Nose: without d/c or deformity Neck: Neck supple. Gross normal ROM Cardiovascular: Normal rate and regular rhythm.   Pulmonary/Chest: Effort normal and breath sounds without rales or wheezing.  Abd:  Soft, NT, ND, + BS, no organomegaly Neurological: Pt is alert. At baseline orientation, motor grossly intact Skin: Skin is warm. No rashes, other new lesions, no LE edema Psychiatric: Pt behavior is normal without agitation , mild nervous No other exam findings Lab Results  Component Value Date   WBC 17.0 (H) 10/13/2017   HGB 13.9 10/13/2017   HCT 41.4 10/13/2017   PLT 319 10/13/2017   GLUCOSE 116 (H) 10/13/2017   CHOL 198 03/11/2017   TRIG 93.0 03/11/2017   HDL 75.60 03/11/2017   LDLCALC 104 (H) 03/11/2017   ALT 24 10/13/2017  AST 27 10/13/2017   NA 143 10/13/2017   K 3.4 (L) 10/13/2017   CL 104 10/13/2017   CREATININE 0.68 10/13/2017   BUN 5 (L) 10/13/2017   CO2 28 10/13/2017   TSH 1.088 10/13/2017   HGBA1C 5.2 03/11/2017       Assessment & Plan:

## 2017-11-11 NOTE — Assessment & Plan Note (Signed)
stable overall by history and exam, recent data reviewed with pt, and pt to continue medical treatment as before,  to f/u any worsening symptoms or concerns  

## 2017-11-11 NOTE — Patient Instructions (Signed)
Please take all new medication as prescribed -the adderall as prescribed  Please continue all other medications as before, and refills have been done if requested.  Please have the pharmacy call with any other refills you may need.  Please continue your efforts at being more active, low cholesterol diet, and weight control.  Please keep your appointments with your specialists as you may have planned  Please return in 6 months, or sooner if needed

## 2017-11-11 NOTE — Assessment & Plan Note (Signed)
stable overall by history and exam, recent data reviewed with pt, and pt to continue medical treatment as before,  to f/u psychiatry as planned

## 2017-11-16 MED ORDER — SCOPOLAMINE 1 MG/3DAYS TD PT72: 1.0000 | MEDICATED_PATCH | Freq: Once | TRANSDERMAL | Status: DC

## 2017-11-16 MED ORDER — LACTATED RINGERS IV SOLN: INTRAVENOUS | Status: DC

## 2017-11-16 NOTE — H&P (Signed)
  History of Present Illness  General:  Pt presents for preop for hysteroscopy/D&C, removal of endometrial mass which is suspected to be a polyp.  Pt has heavy menses. Polyp was found a few years ago but she did not have polyp removed. SHG done in our office July 2019 showed 2 masses.   Current Medications  Taking   Alesse(Ammonium Lactate) 0.10 Levo tablet 0ne orally q day per package instructions/ starting 1st day of menses   Hair Skin Nails - Capsule as directed Orally   Lexapro(Escitalopram Oxalate) 10 MG Tablet 1 tablet Orally Once a day   Xanax(ALPRAZolam) 0.25 MG Tablet 1 tablet Orally Twice a day, Notes: PRN but pt doesn't take it   Not-Taking   Adderall(Amphetamine-Dextroamphetamine) 20 MG Tablet 1 tablet Orally Twice a day   Medication List reviewed and reconciled with the patient    Past Medical History  Depression/Anxiety-Dr. Oliver Barre.   ADD.   Breast cysts, bilateral.           Surgical History  cervical biopsy, polyp 2011   Family History  Mother: alive  Paternal Grand Father: deceased  Maternal Grand Mother: deceased, Blood clotting  Maternal aunt: alive, diagnosed with Diabetes  denies any GYN family cancer hx.   Social History  General:  no Alcohol.  Tobacco use  cigarettes: Former smoker Quit in year 2019 Tobacco history last updated 08/11/2017 Marital Status: single.  no Recreational drug use.  OCCUPATION: Armed forces logistics/support/administrative officer.  Exercise: yes, minimal.    Gyn History  Sexual activity currently sexually active.  Periods : every month.  LMP Current .  Birth control none, stop taking ocp 2months ago.  Last pap smear date 07/26/2017 Neg/HPV+ Neg16, 18/45.  Denies H/O Last mammogram date.  Denies H/O Abnormal pap smear.  Denies H/O STD.    OB History  Number of pregnancies 1.  abortion 1.  Pregnancy # 1 abortion.    Allergies  N.K.D.A.   Hospitalization/Major Diagnostic Procedure  None this past yr. 06/2017   Review of Systems   Denies fever/chills, chest pain, SOB, headaches, numbness/tingling. No h/o complication with anesthesia, bleeding disorders or blood clots.   Vital Signs  Wt 228.4, Wt change 6.4 lb, Ht 69.25, BMI 33.48, Pulse sitting 94, BP sitting 138/80.   Physical Examination  GENERAL:  Patient appears alert and oriented.  General Appearance: well-appearing, well-developed, no acute distress.  Speech: clear.  LUNGS:  Auscultation: no wheezing/rhonchi/rales. CTA bilaterally.  HEART:  Heart sounds: normal. RRR. no murmur.  ABDOMEN:  General: soft nontender, nondistended, no masses.  FEMALE GENITOURINARY:  Pelvic Not examined.  EXTREMITIES:  General: No edema or calf tenderness.     Assessments   1. Preop examination - Z01.818 (Primary)   2. Endometrial mass - N94.89, Suspect polyps.   3. Menorrhagia with regular cycle - N92.0   Treatment  1. Preop examination  Notes: Pt counseled on R/B/A of procedure. Questions answered.    Visit Codes  [address] OV LEVEL 3.    Follow Up  2 Weeks post op

## 2017-11-17 ENCOUNTER — Ambulatory Visit (HOSPITAL_COMMUNITY)
Admission: RE | Admit: 2017-11-17 | Discharge: 2017-11-17 | Disposition: A | Discharge status: Home / Self Care | Payer: BLUE CROSS/BLUE SHIELD | Source: Ambulatory Visit | Attending: Obstetrics and Gynecology | Admitting: Obstetrics and Gynecology

## 2017-11-17 ENCOUNTER — Encounter (HOSPITAL_COMMUNITY): Payer: Self-pay | Admitting: Anesthesiology

## 2017-11-17 ENCOUNTER — Other Ambulatory Visit: Payer: Self-pay

## 2017-11-17 ENCOUNTER — Ambulatory Visit (HOSPITAL_COMMUNITY): Payer: BLUE CROSS/BLUE SHIELD | Admitting: Anesthesiology

## 2017-11-17 ENCOUNTER — Encounter (HOSPITAL_COMMUNITY)
Admission: RE | Disposition: A | Discharge status: Home / Self Care | Payer: Self-pay | Source: Ambulatory Visit | Attending: Obstetrics and Gynecology

## 2017-11-17 DIAGNOSIS — N840 Polyp of corpus uteri: Secondary | ICD-10-CM | POA: Diagnosis not present

## 2017-11-17 DIAGNOSIS — Z79899 Other long term (current) drug therapy: Secondary | ICD-10-CM | POA: Insufficient documentation

## 2017-11-17 DIAGNOSIS — Z87891 Personal history of nicotine dependence: Secondary | ICD-10-CM | POA: Diagnosis not present

## 2017-11-17 DIAGNOSIS — N920 Excessive and frequent menstruation with regular cycle: Secondary | ICD-10-CM | POA: Insufficient documentation

## 2017-11-17 DIAGNOSIS — Z793 Long term (current) use of hormonal contraceptives: Secondary | ICD-10-CM | POA: Insufficient documentation

## 2017-11-17 HISTORY — DX: Headache, unspecified: R51.9

## 2017-11-17 HISTORY — DX: Anemia, unspecified: D64.9

## 2017-11-17 HISTORY — DX: Sleep apnea, unspecified: G47.30

## 2017-11-17 HISTORY — PX: DILATATION & CURETTAGE/HYSTEROSCOPY WITH MYOSURE: SHX6511

## 2017-11-17 HISTORY — DX: Headache: R51

## 2017-11-17 HISTORY — DX: Unspecified asthma, uncomplicated: J45.909

## 2017-11-17 HISTORY — DX: Other complications of anesthesia, initial encounter: T88.59XA

## 2017-11-17 HISTORY — DX: Unspecified coma: R40.20

## 2017-11-17 HISTORY — DX: Adverse effect of unspecified anesthetic, initial encounter: T41.45XA

## 2017-11-17 LAB — CBC
HCT: 41.9 % (ref 36.0–46.0)
HEMOGLOBIN: 13.4 g/dL (ref 12.0–15.0)
MCH: 29.7 pg (ref 26.0–34.0)
MCHC: 32 g/dL (ref 30.0–36.0)
MCV: 92.9 fL (ref 80.0–100.0)
Platelets: 278 10*3/uL (ref 150–400)
RBC: 4.51 MIL/uL (ref 3.87–5.11)
RDW: 13.5 % (ref 11.5–15.5)
WBC: 8 10*3/uL (ref 4.0–10.5)

## 2017-11-17 LAB — PREGNANCY, URINE: Preg Test, Ur: NEGATIVE

## 2017-11-17 SURGERY — DILATATION & CURETTAGE/HYSTEROSCOPY WITH MYOSURE
Anesthesia: General | Site: Vagina

## 2017-11-17 MED ORDER — LIDOCAINE HCL 2 % IJ SOLN
INTRAMUSCULAR | Status: DC | PRN
  Administered 2017-11-17: 10 mL

## 2017-11-17 MED ORDER — ONDANSETRON HCL 4 MG/2ML IJ SOLN
INTRAMUSCULAR | Status: DC | PRN
  Administered 2017-11-17: 4 mg via INTRAVENOUS

## 2017-11-17 MED ORDER — PROPOFOL 10 MG/ML IV BOLUS
INTRAVENOUS | Status: DC | PRN
  Administered 2017-11-17: 150 mg via INTRAVENOUS

## 2017-11-17 MED ORDER — PROPOFOL 10 MG/ML IV BOLUS
INTRAVENOUS | Status: AC
  Filled 2017-11-17: qty 20

## 2017-11-17 MED ORDER — HYDROMORPHONE HCL 1 MG/ML IJ SOLN
INTRAMUSCULAR | Status: DC | PRN
  Administered 2017-11-17: 1 mg via INTRAVENOUS

## 2017-11-17 MED ORDER — SILVER NITRATE-POT NITRATE 75-25 % EX MISC
CUTANEOUS | Status: AC
  Filled 2017-11-17: qty 1

## 2017-11-17 MED ORDER — DEXAMETHASONE SODIUM PHOSPHATE 10 MG/ML IJ SOLN
INTRAMUSCULAR | Status: DC | PRN
  Administered 2017-11-17: 4 mg via INTRAVENOUS

## 2017-11-17 MED ORDER — ONDANSETRON HCL 4 MG/2ML IJ SOLN: 4.0000 mg | Freq: Once | INTRAMUSCULAR | Status: DC | PRN

## 2017-11-17 MED ORDER — LIDOCAINE HCL (CARDIAC) PF 100 MG/5ML IV SOSY
PREFILLED_SYRINGE | INTRAVENOUS | Status: AC
  Filled 2017-11-17: qty 5

## 2017-11-17 MED ORDER — FENTANYL CITRATE (PF) 100 MCG/2ML IJ SOLN
INTRAMUSCULAR | Status: AC
  Filled 2017-11-17: qty 2

## 2017-11-17 MED ORDER — ACETAMINOPHEN 325 MG PO TABS: 325.0000 mg | ORAL_TABLET | ORAL | Status: DC | PRN

## 2017-11-17 MED ORDER — KETOROLAC TROMETHAMINE 30 MG/ML IJ SOLN
INTRAMUSCULAR | Status: DC | PRN
  Administered 2017-11-17: 30 mg via INTRAVENOUS

## 2017-11-17 MED ORDER — HYDROMORPHONE HCL 1 MG/ML IJ SOLN
INTRAMUSCULAR | Status: AC
  Filled 2017-11-17: qty 1

## 2017-11-17 MED ORDER — OXYCODONE HCL 5 MG/5ML PO SOLN: 5.0000 mg | Freq: Once | ORAL | Status: DC | PRN

## 2017-11-17 MED ORDER — IBUPROFEN 800 MG PO TABS: 800.0000 mg | ORAL_TABLET | Freq: Three times a day (TID) | ORAL | 0 refills | Status: DC | PRN

## 2017-11-17 MED ORDER — FENTANYL CITRATE (PF) 100 MCG/2ML IJ SOLN
INTRAMUSCULAR | Status: DC | PRN
  Administered 2017-11-17 (×2): 100 ug via INTRAVENOUS

## 2017-11-17 MED ORDER — MIDAZOLAM HCL 2 MG/2ML IJ SOLN
INTRAMUSCULAR | Status: DC | PRN
  Administered 2017-11-17: 2 mg via INTRAVENOUS

## 2017-11-17 MED ORDER — MIDAZOLAM HCL 2 MG/2ML IJ SOLN
INTRAMUSCULAR | Status: AC
  Filled 2017-11-17: qty 2

## 2017-11-17 MED ORDER — DEXAMETHASONE SODIUM PHOSPHATE 4 MG/ML IJ SOLN
INTRAMUSCULAR | Status: AC
  Filled 2017-11-17: qty 1

## 2017-11-17 MED ORDER — LIDOCAINE HCL (CARDIAC) PF 100 MG/5ML IV SOSY
PREFILLED_SYRINGE | INTRAVENOUS | Status: DC | PRN
  Administered 2017-11-17: 100 mg via INTRAVENOUS

## 2017-11-17 MED ORDER — KETOROLAC TROMETHAMINE 30 MG/ML IJ SOLN
INTRAMUSCULAR | Status: AC
  Filled 2017-11-17: qty 1

## 2017-11-17 MED ORDER — SILVER NITRATE-POT NITRATE 75-25 % EX MISC
CUTANEOUS | Status: DC | PRN
  Administered 2017-11-17: 2

## 2017-11-17 MED ORDER — LIDOCAINE HCL 2 % IJ SOLN
INTRAMUSCULAR | Status: AC
  Filled 2017-11-17: qty 20

## 2017-11-17 MED ORDER — ONDANSETRON HCL 4 MG/2ML IJ SOLN
INTRAMUSCULAR | Status: AC
  Filled 2017-11-17: qty 2

## 2017-11-17 MED ORDER — MEPERIDINE HCL 25 MG/ML IJ SOLN: 6.2500 mg | INTRAMUSCULAR | Status: DC | PRN

## 2017-11-17 MED ORDER — OXYCODONE HCL 5 MG PO TABS: 5.0000 mg | ORAL_TABLET | Freq: Once | ORAL | Status: DC | PRN

## 2017-11-17 MED ORDER — LACTATED RINGERS IV SOLN
INTRAVENOUS | Status: DC
  Administered 2017-11-17: 12:00:00 via INTRAVENOUS

## 2017-11-17 MED ORDER — SCOPOLAMINE 1 MG/3DAYS TD PT72
MEDICATED_PATCH | TRANSDERMAL | Status: AC
  Administered 2017-11-17: 1.5 mg via TRANSDERMAL
  Filled 2017-11-17: qty 1

## 2017-11-17 MED ORDER — ACETAMINOPHEN 160 MG/5ML PO SOLN: 325.0000 mg | ORAL | Status: DC | PRN

## 2017-11-17 MED ORDER — KETOROLAC TROMETHAMINE 30 MG/ML IJ SOLN: 30.0000 mg | Freq: Once | INTRAMUSCULAR | Status: DC | PRN

## 2017-11-17 MED ORDER — SCOPOLAMINE 1 MG/3DAYS TD PT72
1.0000 | MEDICATED_PATCH | Freq: Once | TRANSDERMAL | Status: DC
  Administered 2017-11-17: 1.5 mg via TRANSDERMAL

## 2017-11-17 MED ORDER — FENTANYL CITRATE (PF) 100 MCG/2ML IJ SOLN: 25.0000 ug | INTRAMUSCULAR | Status: DC | PRN

## 2017-11-17 SURGICAL SUPPLY — 16 items
CANISTER SUCT 3000ML PPV (MISCELLANEOUS) ×2 IMPLANT
CATH ROBINSON RED A/P 16FR (CATHETERS) ×2 IMPLANT
DEVICE MYOSURE LITE (MISCELLANEOUS) IMPLANT
DEVICE MYOSURE REACH (MISCELLANEOUS) ×2 IMPLANT
DILATOR CANAL MILEX (MISCELLANEOUS) ×3 IMPLANT
FILTER ARTHROSCOPY CONVERTOR (FILTER) ×2 IMPLANT
GLOVE BIO SURGEON STRL SZ7 (GLOVE) ×2 IMPLANT
GLOVE BIOGEL PI IND STRL 7.0 (GLOVE) ×2 IMPLANT
GLOVE BIOGEL PI INDICATOR 7.0 (GLOVE) ×2
GOWN STRL REUS W/TWL LRG LVL3 (GOWN DISPOSABLE) ×4 IMPLANT
PACK VAGINAL MINOR WOMEN LF (CUSTOM PROCEDURE TRAY) ×2 IMPLANT
PAD OB MATERNITY 4.3X12.25 (PERSONAL CARE ITEMS) ×2 IMPLANT
SEAL ROD LENS SCOPE MYOSURE (ABLATOR) ×2 IMPLANT
TOWEL OR 17X24 6PK STRL BLUE (TOWEL DISPOSABLE) ×4 IMPLANT
TUBING AQUILEX INFLOW (TUBING) ×2 IMPLANT
TUBING AQUILEX OUTFLOW (TUBING) ×4 IMPLANT

## 2017-11-17 NOTE — Discharge Instructions (Addendum)
Dilation and Curettage or Vacuum Curettage, Care After °This sheet gives you information about how to care for yourself after your procedure. Your health care provider may also give you more specific instructions. If you have problems or questions, contact your health care provider. °What can I expect after the procedure? °After your procedure, it is common to have: °· Mild pain or cramping. °· Some vaginal bleeding or spotting. ° °These may last for up to 2 weeks after your procedure. °Follow these instructions at home: °Activity ° °· Do not drive or use heavy machinery while taking prescription pain medicine. °· Avoid driving for the first 24 hours after your procedure. °· Take frequent, short walks, followed by rest periods, throughout the day. Ask your health care provider what activities are safe for you. After 1-2 days, you may be able to return to your normal activities. °· Do not lift anything heavier than 10 lb (4.5 kg) until your health care provider approves. °· For at least 2 weeks, or as long as told by your health care provider, do not: °? Douche. °? Use tampons. °? Have sexual intercourse. °General instructions ° °· Take over-the-counter and prescription medicines only as told by your health care provider. This is especially important if you take blood thinning medicine. °· Do not take baths, swim, or use a hot tub until your health care provider approves. Take showers instead of baths. °· Wear compression stockings as told by your health care provider. These stockings help to prevent blood clots and reduce swelling in your legs. °· It is your responsibility to get the results of your procedure. Ask your health care provider, or the department performing the procedure, when your results will be ready. °· Keep all follow-up visits as told by your health care provider. This is important. °Contact a health care provider if: °· You have severe cramps that get worse or that do not get better with  medicine. °· You have severe abdominal pain. °· You cannot drink fluids without vomiting. °· You develop pain in a different area of your pelvis. °· You have bad-smelling vaginal discharge. °· You have a rash. °Get help right away if: °· You have vaginal bleeding that soaks more than one sanitary pad in 1 hour, for 2 hours in a row. °· You pass large blood clots from your vagina. °· You have a fever that is above 100.4°F (38.0°C). °· Your abdomen feels very tender or hard. °· You have chest pain. °· You have shortness of breath. °· You cough up blood. °· You feel dizzy or light-headed. °· You faint. °· You have pain in your neck or shoulder area. °This information is not intended to replace advice given to you by your health care provider. Make sure you discuss any questions you have with your health care provider. °Document Released: 01/10/2000 Document Revised: 09/11/2015 Document Reviewed: 08/15/2015 °Elsevier Interactive Patient Education © 2018 [employer] ° ° °Post Anesthesia Home Care Instructions ° °Activity: °Get plenty of rest for the remainder of the day. A responsible individual must stay with you for 24 hours following the procedure.  °For the next 24 hours, DO NOT: °-Drive a car °-Operate machinery °-Drink alcoholic beverages °-Take any medication unless instructed by your physician °-Make any legal decisions or sign important papers. ° °Meals: °Start with liquid foods such as gelatin or soup. Progress to regular foods as tolerated. Avoid greasy, spicy, heavy foods. If nausea and/or vomiting occur, drink only clear liquids until the nausea   and/or vomiting subsides. Call your physician if vomiting continues. ° °Special Instructions/Symptoms: °Your throat may feel dry or sore from the anesthesia or the breathing tube placed in your throat during surgery. If this causes discomfort, gargle with warm salt water. The discomfort should disappear within 24 hours. ° °If you had a scopolamine patch placed  behind your ear for the management of post- operative nausea and/or vomiting: ° °1. The medication in the patch is effective for 72 hours, after which it should be removed.  Wrap patch in a tissue and discard in the trash. Wash hands thoroughly with soap and water. °2. You may remove the patch earlier than 72 hours if you experience unpleasant side effects which may include dry mouth, dizziness or visual disturbances. °3. Avoid touching the patch. Wash your hands with soap and water after contact with the patch. °  ° ° °

## 2017-11-17 NOTE — Brief Op Note (Signed)
11/17/2017  1:44 PM  PATIENT:  [name]  37 y.o. female  PRE-OPERATIVE DIAGNOSIS:  N84.0 Endometrial polyp, Menorrhagia  POST-OPERATIVE DIAGNOSIS:  endometrial polyp  PROCEDURE:  Procedure(s): DILATATION & CURETTAGE/HYSTEROSCOPY WITH MYOSURE (N/A) Polypectomy  SURGEON:  Surgeon(s) and Role:    Geryl Rankins, MD - Primary  PHYSICIAN ASSISTANT:   ASSISTANTS: none   ANESTHESIA:   general and paracervical block  EBL:  10 mL   Fluid Deficit: Less than 400 ml  BLOOD ADMINISTERED:none  DRAINS: none   LOCAL MEDICATIONS USED:  2 % LIDOCAINE  and Amount: 10 ml  SPECIMEN:  Source of Specimen:  Endometrial polyp and endometrial currettings  DISPOSITION OF SPECIMEN:  PATHOLOGY  COUNTS:  YES  TOURNIQUET:  * No tourniquets in log *  DICTATION: .Other Dictation: Dictation Number [id number]  PLAN OF CARE: Discharge to home after PACU  PATIENT DISPOSITION:  PACU - hemodynamically stable.   Delay start of Pharmacological VTE agent (>24hrs) due to surgical blood loss or risk of bleeding: not applicable

## 2017-11-17 NOTE — Transfer of Care (Signed)
Immediate Anesthesia Transfer of Care Note  Patient: [name]  Procedure(s) Performed: DILATATION & CURETTAGE/HYSTEROSCOPY WITH MYOSURE (N/A Vagina )  Patient Location: PACU  Anesthesia Type:General  Level of Consciousness: awake, alert  and oriented  Airway & Oxygen Therapy: Patient Spontanous Breathing  Post-op Assessment: Report given to RN and Post -op Vital signs reviewed and stable  Post vital signs: Reviewed and stable  Last Vitals:  Vitals Value Taken Time  BP    Temp    Pulse 76 11/17/2017  1:33 PM  Resp 9 11/17/2017  1:33 PM  SpO2 97 % 11/17/2017  1:33 PM  Vitals shown include unvalidated device data.  Last Pain:  Vitals:   11/17/17 1157  TempSrc: Oral  PainSc: 0-No pain      Patients Stated Pain Goal: 3 (11/17/17 1157)  Complications: No apparent anesthesia complications

## 2017-11-17 NOTE — Interval H&P Note (Signed)
History and Physical Interval Note:  11/17/2017 11:39 AM  [name]  has presented today for surgery, with the diagnosis of N84.0 Endometrial polyp  The various methods of treatment have been discussed with the patient and family. After consideration of risks, benefits and other options for treatment, the patient has consented to  Procedure(s): DILATATION & CURETTAGE/HYSTEROSCOPY WITH MYOSURE (N/A) as a surgical intervention .  The patient's history has been reviewed, patient examined, no change in status, stable for surgery.  I have reviewed the patient's chart and labs.  Questions were answered to the patient's satisfaction.     Geryl Rankins

## 2017-11-17 NOTE — Anesthesia Postprocedure Evaluation (Signed)
Anesthesia Post Note  Patient: [name]  Procedure(s) Performed: DILATATION & CURETTAGE/HYSTEROSCOPY WITH MYOSURE (N/A Vagina )     Patient location during evaluation: PACU Anesthesia Type: General Level of consciousness: awake Pain management: pain level controlled Vital Signs Assessment: post-procedure vital signs reviewed and stable Respiratory status: spontaneous breathing Cardiovascular status: stable Postop Assessment: no apparent nausea or vomiting Anesthetic complications: no    Last Vitals:  Vitals:   11/17/17 1400 11/17/17 1415  BP: 108/64 111/62  Pulse: 76 79  Resp: (!) 21 16  Temp:  36.5 C  SpO2: 93% 96%    Last Pain:  Vitals:   11/17/17 1415  TempSrc:   PainSc: 0-No pain   Pain Goal: Patients Stated Pain Goal: 3 (11/17/17 1332)               Caren Macadam

## 2017-11-17 NOTE — Op Note (Signed)
NAME: [name], [name] MEDICAL RECORD [id number] ACCOUNT [id number] DATE OF BIRTH:November 02, 1980 FACILITY: [facility] LOCATION: [facility] PHYSICIAN:Gursimran Litaker Derrell Lolling, MD  OPERATIVE REPORT  DATE OF PROCEDURE:  11/17/2017  PREOPERATIVE DIAGNOSES:  Endometrial polyp and menorrhagia.  POSTOPERATIVE DIAGNOSES:  Endometrial polyp and menorrhagia.  PROCEDURE:  Hysteroscopy, dilatation and curettage with endometrial polypectomy via MyoSure.  SURGEON:  Geryl Rankins, MD  ANESTHESIA:  General and paracervical block.  ESTIMATED BLOOD LOSS:  10 mL.  FLUID DEFICIT:  Less than 400 mL.  BLOOD ADMINISTERED:  None.  DRAINS:  None.  LOCAL:  Lidocaine 2%, 10 mL.  SPECIMENS:  Endometrial polyp and endometrial curettings.  DISPOSITION OF SPECIMEN:  To Pathology.  COUNTS:  Correct Yes.  DISPOSITION:  To PACU hemodynamically stable.  FINDINGS:  Endocervical polyp and multiple normal appearing endometrial polyps.  Normal shape of the intrauterine cavity.  COMPLICATIONS:  None.  DESCRIPTION OF PROCEDURE:  The patient was identified in the holding area.  She was then taken to the operating room with IV running.  She was placed in the dorsal lithotomy position and underwent anesthesia without complication.  SCDs were on her legs  and operating.  A timeout was performed.  After the patient was prepped and draped.  Bimanual exam revealed an anteverted uterus.  I then put in a Graves speculum and the cervix was tacked up anteriorly, so it needed a longer Graves speculum to visualize the anterior lip of the cervix.  Once I  was able to visualize the anterior lip of the cervix, I injected 2% lidocaine and then grasped the lip of the cervix with a single tooth tenaculum and pulled the cervix down into the speculum.  I would later complete the paracervical block after the  procedure was completed.  Then used an os finder to dilate the cervix, the internal os was tight.  I dilated up to a #6 and was unable to   put the hysteroscope through, then dilated up to an 8.  Once we got into the cavity going into the cervix, I saw  the polyp, which I eventually was sucked into the outflow channel which diminished our ability to suction the cavity.  Because of that, we increased distention up to 80-100.  I eventually got inside the cavity and used some MyoSure we did have prior to  getting in the cavity and using the MyoSure because we were not getting suction of outflow, we changed the tubing and recognize that there was still poor outflow.  It was when we took the MyoSure out of the outflow channel, we saw that the polyp had been  stuck in the chamber.  The MyoSure was then advanced under direct visualization, I was able to resect all endometrial polyps.  Uterine fundus appeared normal.  No signs of perforation.  The deficit was normal.  Once the uterus was cleared of all polyps via the MyoSure and  endometrial curettage was performed of all 4 quadrants.  All instruments were then removed from the uterus.  Single tooth tenaculum was removed after the paracervical block was completed.  Hemostasis with silver nitrate was noted.  Minimal bleeding noted through the cervical os.  All instrument, sponge counts were correct x3.    The patient tolerated the procedure well.  She was then taken to the recovery room in stable condition.  AN/NUANCE  D:11/17/2017 T:11/17/2017 JOB:[id number]

## 2017-11-17 NOTE — Anesthesia Procedure Notes (Signed)
Procedure Name: LMA Insertion Date/Time: 11/17/2017 12:45 PM Performed by: Graciela Husbands, CRNA Pre-anesthesia Checklist: Patient identified, Patient being monitored, Emergency Drugs available, Timeout performed and Suction available Patient Re-evaluated:Patient Re-evaluated prior to induction Oxygen Delivery Method: Circle System Utilized Preoxygenation: Pre-oxygenation with 100% oxygen Induction Type: IV induction Ventilation: Mask ventilation without difficulty LMA: LMA inserted LMA Size: 4.0 Number of attempts: 1 Placement Confirmation: positive ETCO2 and breath sounds checked- equal and bilateral Tube secured with: Tape Dental Injury: Teeth and Oropharynx as per pre-operative assessment

## 2017-11-17 NOTE — Anesthesia Preprocedure Evaluation (Signed)
Anesthesia Evaluation  Patient identified by MRN, date of birth, ID band Patient awake    Reviewed: Allergy & Precautions, NPO status , Patient's Chart, lab work & pertinent test results  Airway Mallampati: I       Dental no notable dental hx. (+) Teeth Intact   Pulmonary Current Smoker,    Pulmonary exam normal breath sounds clear to auscultation       Cardiovascular negative cardio ROS Normal cardiovascular exam Rhythm:Regular Rate:Normal     Neuro/Psych    GI/Hepatic negative GI ROS, Neg liver ROS,   Endo/Other  negative endocrine ROS  Renal/GU negative Renal ROS     Musculoskeletal   Abdominal   Peds  Hematology   Anesthesia Other Findings   Reproductive/Obstetrics                             Anesthesia Physical Anesthesia Plan  ASA: II  Anesthesia Plan: General   Post-op Pain Management:    Induction: Intravenous  PONV Risk Score and Plan: Ondansetron, Dexamethasone, Scopolamine patch - Pre-op and Midazolam  Airway Management Planned: LMA  Additional Equipment:   Intra-op Plan:   Post-operative Plan: Extubation in OR  Informed Consent: I have reviewed the patients History and Physical, chart, labs and discussed the procedure including the risks, benefits and alternatives for the proposed anesthesia with the patient or authorized representative who has indicated his/her understanding and acceptance.   Dental advisory given  Plan Discussed with: CRNA and Surgeon  Anesthesia Plan Comments:         Anesthesia Quick Evaluation

## 2017-11-18 ENCOUNTER — Encounter (HOSPITAL_COMMUNITY): Payer: Self-pay | Admitting: Obstetrics and Gynecology

## 2017-12-09 ENCOUNTER — Other Ambulatory Visit: Payer: Self-pay | Admitting: Internal Medicine

## 2017-12-09 MED ORDER — AMPHETAMINE-DEXTROAMPHETAMINE 20 MG PO TABS: 20.0000 mg | ORAL_TABLET | Freq: Every day | ORAL | 0 refills | Status: DC

## 2017-12-09 MED ORDER — AMPHETAMINE-DEXTROAMPHETAMINE 10 MG PO TABS: 10.0000 mg | ORAL_TABLET | Freq: Every day | ORAL | 0 refills | Status: DC

## 2017-12-09 NOTE — Telephone Encounter (Signed)
Done erx 

## 2017-12-10 NOTE — Telephone Encounter (Signed)
MD approved and sent electronically to [facility]  

## 2017-12-30 ENCOUNTER — Ambulatory Visit: Payer: BLUE CROSS/BLUE SHIELD | Admitting: Internal Medicine

## 2017-12-30 ENCOUNTER — Encounter: Payer: Self-pay | Admitting: Internal Medicine

## 2017-12-30 VITALS — BP 124/86 | HR 109 | Temp 98.1°F | Ht 69.13 in | Wt 248.0 lb

## 2017-12-30 DIAGNOSIS — R739 Hyperglycemia, unspecified: Secondary | ICD-10-CM

## 2017-12-30 DIAGNOSIS — J019 Acute sinusitis, unspecified: Secondary | ICD-10-CM | POA: Diagnosis not present

## 2017-12-30 DIAGNOSIS — F302 Manic episode, severe with psychotic symptoms: Secondary | ICD-10-CM | POA: Diagnosis not present

## 2017-12-30 MED ORDER — HYDROCODONE-HOMATROPINE 5-1.5 MG/5ML PO SYRP: 5.0000 mL | ORAL_SOLUTION | Freq: Four times a day (QID) | ORAL | 0 refills | Status: AC | PRN

## 2017-12-30 MED ORDER — AZITHROMYCIN 250 MG PO TABS: ORAL_TABLET | ORAL | 1 refills | Status: DC

## 2017-12-30 NOTE — Assessment & Plan Note (Signed)
stable overall by history and exam, recent data reviewed with pt, and pt to continue medical treatment as before,  to f/u any worsening symptoms or concerns  

## 2017-12-30 NOTE — Progress Notes (Signed)
Subjective:    Patient ID: [name], female    DOB: Jun 04, 1980, 37 y.o.   MRN: [id number]  HPI  Here with 2-3 days acute onset fever, facial pain, pressure, headache, general weakness and malaise, and greenish d/c, with mild ST and cough, but pt denies chest pain, wheezing, increased sob or doe, orthopnea, PND, increased LE swelling, palpitations, dizziness or syncope.   Pt denies polydipsia, polyuria, or low sugar symptoms such as weakness or confusion improved with po intake.  Pt states overall good compliance with meds, trying to follow lower cholesterol, diabetic diet, wt overall stable but little exercise however.      Pt denies wt loss, night sweats, loss of appetite, or other constitutional symptoms  Denies worsening depressive symptoms, suicidal ideation, or panic Past Medical History:  Diagnosis Date  . ADD 10/22/2009  . ALLERGIC RHINITIS 09/26/2008  . Anemia   . ANXIETY 09/26/2008  . Asthma   . Complication of anesthesia    "tempermental" per patient  . CONSTIPATION 09/26/2008  . DEPRESSION 09/26/2008  . FATIGUE 01/14/2009  . Headache   . HYPERLIPIDEMIA 09/26/2008  . OTITIS MEDIA, LEFT 01/14/2009  . SKIN LESION 09/26/2008  . Sleep apnea   . TONSILLITIS, ACUTE 01/14/2009  . Unconscious state (HCC) 10/13/2017  . URI 10/22/2009   Past Surgical History:  Procedure Laterality Date  . CERVICAL POLYPECTOMY  2009  . DILATATION & CURETTAGE/HYSTEROSCOPY WITH MYOSURE N/A 11/17/2017   Procedure: DILATATION & CURETTAGE/HYSTEROSCOPY WITH MYOSURE;  Surgeon: Geryl Rankins, MD;  Location: [facility];  Service: Gynecology;  Laterality: N/A;  . extraction of wisdom teeth     error in charting  . polyp removal       reports that she has been smoking cigarettes. She has a 10.00 pack-year smoking history. She has never used smokeless tobacco. She reports that she drank alcohol. She reports that she does not use drugs. family history includes Diabetes in her other; Hypertension in her mother and  other; Thyroid disease in her mother. No Known Allergies Current Outpatient Medications on File Prior to Visit  Medication Sig Dispense Refill  . albuterol (PROVENTIL HFA;VENTOLIN HFA) 108 (90 Base) MCG/ACT inhaler Inhale 2 puffs into the lungs every 6 (six) hours as needed for wheezing or shortness of breath. 1 Inhaler 2  . amantadine (SYMMETREL) 100 MG capsule Take 100 mg by mouth 2 (two) times daily.    Marland Kitchen amphetamine-dextroamphetamine (ADDERALL) 10 MG tablet Take 1 tablet (10 mg total) by mouth daily after lunch. 30 tablet 0  . amphetamine-dextroamphetamine (ADDERALL) 20 MG tablet Take 1 tablet (20 mg total) by mouth daily before breakfast. 30 tablet 0  . ARIPiprazole (ABILIFY) 15 MG tablet Take 1 tablet (15 mg total) by mouth daily. For mood control 30 tablet 0  . ARIPiprazole (ABILIFY) 20 MG tablet Take 10 mg by mouth 2 (two) times daily.    . Biotin w/ Vitamins C & E (HAIR/SKIN/NAILS PO) Take 3 tablets by mouth daily.    . cyclobenzaprine (FLEXERIL) 5 MG tablet Take 1 tablet (5 mg total) by mouth 3 (three) times daily as needed for muscle spasms. 30 tablet 0  . escitalopram (LEXAPRO) 20 MG tablet Take 1 tablet (20 mg total) by mouth daily. For depression 30 tablet 0  . hydrOXYzine (ATARAX/VISTARIL) 50 MG tablet Take 1 tablet (50 mg total) by mouth every 6 (six) hours as needed for anxiety. 60 tablet 0  . ibuprofen (ADVIL,MOTRIN) 800 MG tablet Take 1 tablet (800 mg total)  by mouth every 8 (eight) hours as needed. 10 tablet 0  . loratadine-pseudoephedrine (CLARITIN-D 24-HOUR) 10-240 MG 24 hr tablet Take 1 tablet by mouth daily as needed for allergies.    . meloxicam (MOBIC) 15 MG tablet Take 1 tablet (15 mg total) by mouth daily. For arthritis pain (Patient taking differently: Take 15 mg by mouth daily as needed for pain. For arthritis pain) 30 tablet 3  . nicotine polacrilex (NICORETTE) 2 MG gum Take 1 each (2 mg total) by mouth as needed for smoking cessation. (May buy from over the counter):  GFor smoking cessation 100 tablet 0  . traZODone (DESYREL) 50 MG tablet Take 1 tablet (50 mg total) by mouth at bedtime as needed for sleep. 30 tablet 0   No current facility-administered medications on file prior to visit.    Review of Systems  Constitutional: Negative for other unusual diaphoresis or sweats HENT: Negative for ear discharge or swelling Eyes: Negative for other worsening visual disturbances Respiratory: Negative for stridor or other swelling  Gastrointestinal: Negative for worsening distension or other blood Genitourinary: Negative for retention or other urinary change Musculoskeletal: Negative for other MSK pain or swelling Skin: Negative for color change or other new lesions Neurological: Negative for worsening tremors and other numbness  Psychiatric/Behavioral: Negative for worsening agitation or other fatigue All other system neg per pt    Objective:   Physical Exam BP 124/86   Pulse (!) 109   Temp 98.1 F (36.7 C) (Oral)   Ht 5' 9.13" (1.756 m)   Wt 248 lb (112.5 kg)   SpO2 98%   BMI 36.49 kg/m  VS noted,  Constitutional: Pt appears in NAD HENT: Head: NCAT.  Right Ear: External ear normal.  Left Ear: External ear normal.  Eyes: . Pupils are equal, round, and reactive to light. Conjunctivae and EOM are normal Bilat tm's with mild erythema.  Max sinus areas mild tender.  Pharynx with mild erythema, no exudate Nose: without d/c or deformity Neck: Neck supple. Gross normal ROM Cardiovascular: Normal rate and regular rhythm.   Pulmonary/Chest: Effort normal and breath sounds without rales or wheezing.  Neurological: Pt is alert. At baseline orientation, motor grossly intact Skin: Skin is warm. No rashes, other new lesions, no LE edema Psychiatric: Pt behavior is normal without agitation  No other exam findings  Lab Results  Component Value Date   WBC 8.0 11/17/2017   HGB 13.4 11/17/2017   HCT 41.9 11/17/2017   PLT 278 11/17/2017   GLUCOSE 116 (H)  10/13/2017   CHOL 198 03/11/2017   TRIG 93.0 03/11/2017   HDL 75.60 03/11/2017   LDLCALC 104 (H) 03/11/2017   ALT 24 10/13/2017   AST 27 10/13/2017   NA 143 10/13/2017   K 3.4 (L) 10/13/2017   CL 104 10/13/2017   CREATININE 0.68 10/13/2017   BUN 5 (L) 10/13/2017   CO2 28 10/13/2017   TSH 1.088 10/13/2017   HGBA1C 5.2 03/11/2017        Assessment & Plan:

## 2017-12-30 NOTE — Assessment & Plan Note (Addendum)
stable overall by history and exam, recent data reviewed with pt, and pt to continue medical treatment as before,  to f/u any worsening symptoms or concerns  

## 2017-12-30 NOTE — Patient Instructions (Signed)
Please take all new medication as prescribed - the antibiotic, and cough medicine as needed  Please continue all other medications as before, and refills have been done if requested.  Please have the pharmacy call with any other refills you may need.  You are otherwise up to date with prevention measures today.  Please keep your appointments with your specialists as you may have planned

## 2017-12-30 NOTE — Assessment & Plan Note (Addendum)
Mild to mod, for antibx course,  Cough med prn,to f/u any worsening symptoms or concerns 

## 2018-01-06 ENCOUNTER — Other Ambulatory Visit: Payer: Self-pay | Admitting: Internal Medicine

## 2018-01-06 MED ORDER — AMPHETAMINE-DEXTROAMPHETAMINE 10 MG PO TABS: 10.0000 mg | ORAL_TABLET | Freq: Every day | ORAL | 0 refills | Status: DC

## 2018-01-06 MED ORDER — AMPHETAMINE-DEXTROAMPHETAMINE 20 MG PO TABS: 20.0000 mg | ORAL_TABLET | Freq: Every day | ORAL | 0 refills | Status: DC

## 2018-01-06 NOTE — Telephone Encounter (Signed)
Done erx 

## 2018-02-04 ENCOUNTER — Other Ambulatory Visit: Payer: Self-pay | Admitting: Internal Medicine

## 2018-02-04 MED ORDER — AMPHETAMINE-DEXTROAMPHETAMINE 10 MG PO TABS: 10.0000 mg | ORAL_TABLET | Freq: Every day | ORAL | 0 refills | Status: DC

## 2018-02-04 MED ORDER — AMPHETAMINE-DEXTROAMPHETAMINE 20 MG PO TABS: 20.0000 mg | ORAL_TABLET | Freq: Every day | ORAL | 0 refills | Status: DC

## 2018-02-04 NOTE — Telephone Encounter (Signed)
Done erx 

## 2018-03-04 ENCOUNTER — Other Ambulatory Visit: Payer: Self-pay | Admitting: Internal Medicine

## 2018-03-04 MED ORDER — AMPHETAMINE-DEXTROAMPHETAMINE 20 MG PO TABS: 20.0000 mg | ORAL_TABLET | Freq: Every day | ORAL | 0 refills | Status: DC

## 2018-03-04 MED ORDER — AMPHETAMINE-DEXTROAMPHETAMINE 10 MG PO TABS: 10.0000 mg | ORAL_TABLET | Freq: Every day | ORAL | 0 refills | Status: DC

## 2018-03-04 NOTE — Telephone Encounter (Signed)
Done erx 

## 2018-03-30 ENCOUNTER — Encounter: Payer: Self-pay | Admitting: Internal Medicine

## 2018-03-30 ENCOUNTER — Other Ambulatory Visit: Payer: Self-pay | Admitting: Internal Medicine

## 2018-03-30 MED ORDER — AMPHETAMINE-DEXTROAMPHETAMINE 10 MG PO TABS: 10.0000 mg | ORAL_TABLET | Freq: Every day | ORAL | 0 refills | Status: DC

## 2018-03-30 MED ORDER — AMPHETAMINE-DEXTROAMPHETAMINE 20 MG PO TABS: 20.0000 mg | ORAL_TABLET | Freq: Every day | ORAL | 0 refills | Status: DC

## 2018-03-30 NOTE — Telephone Encounter (Signed)
Done erx 

## 2018-04-15 ENCOUNTER — Other Ambulatory Visit: Payer: Self-pay | Admitting: *Deleted

## 2018-04-15 ENCOUNTER — Ambulatory Visit: Payer: BLUE CROSS/BLUE SHIELD | Admitting: Nurse Practitioner

## 2018-04-15 ENCOUNTER — Telehealth: Payer: Self-pay | Admitting: *Deleted

## 2018-04-15 DIAGNOSIS — Z20828 Contact with and (suspected) exposure to other viral communicable diseases: Secondary | ICD-10-CM

## 2018-04-15 NOTE — Telephone Encounter (Signed)
Called and left detail message for patient that nurse was calling her to ask more questions regarding  the symptoms she is experiencing before her visit today in the facility. Nurse did explain she would call back to try to reach patient and also did leave a contact number and requested that the patient call nurse back.

## 2018-04-15 NOTE — Telephone Encounter (Signed)
Patient called nurse back and stated she has been sick since Sunday. She feels feverish but does not have thermometer to take temp, she does have chills. Patient states she is experiencing some SOB at this time and she does have a cough with some sputum coming up. Diarrhea is better but she is nauseated. She continues to have headache. Based upon patient's responses, nurse advised patient to go to [address] to be tested for covid-19. The order was placed and sent per provider.

## 2018-04-15 NOTE — Telephone Encounter (Signed)
Called patient and LVM for Janet to call Nurse back. Stated that nurse need to ask Janet questions regarding Janet sick symptoms before Janet provider visit today. As this was the second VM nurse has left for patient nurse did call patient's mother [name] who is list as a contact.   Nurse did reach [name] and informed Janet that [name] had an appointment today and that this nurse was trying to reach Janet to ask Janet questions before Janet visit. No other information was given to patient's mother. [name] stated she would try to contact [name].

## 2018-04-18 ENCOUNTER — Ambulatory Visit: Payer: Self-pay | Admitting: *Deleted

## 2018-04-18 ENCOUNTER — Other Ambulatory Visit: Payer: Self-pay | Admitting: Internal Medicine

## 2018-04-18 NOTE — Telephone Encounter (Signed)
Done erx 

## 2018-04-18 NOTE — Telephone Encounter (Signed)
Pt called requesting test results for the coronavirus. No results available. Pt advise that the results would be sent to her MyChart. Also reminded her that the results are backed up. Pt voiced understanding.

## 2018-04-21 LAB — NOVEL CORONAVIRUS, NAA: SARS-COV-2, NAA: NOT DETECTED

## 2018-04-28 ENCOUNTER — Other Ambulatory Visit: Payer: Self-pay | Admitting: Internal Medicine

## 2018-04-28 MED ORDER — AMPHETAMINE-DEXTROAMPHETAMINE 20 MG PO TABS: 20.0000 mg | ORAL_TABLET | Freq: Every day | ORAL | 0 refills | Status: DC

## 2018-04-28 MED ORDER — AMPHETAMINE-DEXTROAMPHETAMINE 10 MG PO TABS: 10.0000 mg | ORAL_TABLET | Freq: Every day | ORAL | 0 refills | Status: DC

## 2018-04-28 NOTE — Telephone Encounter (Signed)
Done erx 

## 2018-05-24 ENCOUNTER — Emergency Department (HOSPITAL_COMMUNITY)
Admission: EM | Admit: 2018-05-24 | Discharge: 2018-05-25 | Disposition: A | Discharge status: Other Acute Inpatient Hospital | Payer: BLUE CROSS/BLUE SHIELD | Source: Home / Self Care

## 2018-05-24 ENCOUNTER — Other Ambulatory Visit: Payer: Self-pay

## 2018-05-24 ENCOUNTER — Encounter (HOSPITAL_COMMUNITY): Payer: Self-pay | Admitting: Emergency Medicine

## 2018-05-24 DIAGNOSIS — F5105 Insomnia due to other mental disorder: Secondary | ICD-10-CM

## 2018-05-24 DIAGNOSIS — F22 Delusional disorders: Secondary | ICD-10-CM | POA: Insufficient documentation

## 2018-05-24 DIAGNOSIS — Z9114 Patient's other noncompliance with medication regimen: Secondary | ICD-10-CM

## 2018-05-24 DIAGNOSIS — Z79899 Other long term (current) drug therapy: Secondary | ICD-10-CM

## 2018-05-24 DIAGNOSIS — F319 Bipolar disorder, unspecified: Secondary | ICD-10-CM | POA: Diagnosis not present

## 2018-05-24 DIAGNOSIS — F312 Bipolar disorder, current episode manic severe with psychotic features: Secondary | ICD-10-CM | POA: Diagnosis not present

## 2018-05-24 DIAGNOSIS — F99 Mental disorder, not otherwise specified: Secondary | ICD-10-CM | POA: Insufficient documentation

## 2018-05-24 DIAGNOSIS — J45909 Unspecified asthma, uncomplicated: Secondary | ICD-10-CM | POA: Insufficient documentation

## 2018-05-24 DIAGNOSIS — Z046 Encounter for general psychiatric examination, requested by authority: Secondary | ICD-10-CM

## 2018-05-24 DIAGNOSIS — W19XXXA Unspecified fall, initial encounter: Secondary | ICD-10-CM | POA: Diagnosis not present

## 2018-05-24 DIAGNOSIS — G4700 Insomnia, unspecified: Secondary | ICD-10-CM | POA: Diagnosis not present

## 2018-05-24 DIAGNOSIS — F419 Anxiety disorder, unspecified: Secondary | ICD-10-CM

## 2018-05-24 DIAGNOSIS — F23 Brief psychotic disorder: Secondary | ICD-10-CM | POA: Diagnosis not present

## 2018-05-24 DIAGNOSIS — E785 Hyperlipidemia, unspecified: Secondary | ICD-10-CM | POA: Diagnosis not present

## 2018-05-24 DIAGNOSIS — F17210 Nicotine dependence, cigarettes, uncomplicated: Secondary | ICD-10-CM

## 2018-05-24 DIAGNOSIS — R451 Restlessness and agitation: Secondary | ICD-10-CM | POA: Insufficient documentation

## 2018-05-24 DIAGNOSIS — F29 Unspecified psychosis not due to a substance or known physiological condition: Secondary | ICD-10-CM | POA: Diagnosis not present

## 2018-05-24 DIAGNOSIS — G4730 Sleep apnea, unspecified: Secondary | ICD-10-CM | POA: Diagnosis not present

## 2018-05-24 DIAGNOSIS — F1210 Cannabis abuse, uncomplicated: Secondary | ICD-10-CM | POA: Diagnosis not present

## 2018-05-24 DIAGNOSIS — Y92009 Unspecified place in unspecified non-institutional (private) residence as the place of occurrence of the external cause: Secondary | ICD-10-CM | POA: Diagnosis not present

## 2018-05-24 DIAGNOSIS — T43605A Adverse effect of unspecified psychostimulants, initial encounter: Secondary | ICD-10-CM | POA: Diagnosis not present

## 2018-05-24 DIAGNOSIS — Z791 Long term (current) use of non-steroidal anti-inflammatories (NSAID): Secondary | ICD-10-CM | POA: Diagnosis not present

## 2018-05-24 LAB — RAPID URINE DRUG SCREEN, HOSP PERFORMED
Amphetamines: NOT DETECTED
Barbiturates: NOT DETECTED
Benzodiazepines: NOT DETECTED
Cocaine: NOT DETECTED
Opiates: NOT DETECTED
Tetrahydrocannabinol: POSITIVE — AB

## 2018-05-24 LAB — COMPREHENSIVE METABOLIC PANEL
ALT: 24 U/L (ref 0–44)
AST: 24 U/L (ref 15–41)
Albumin: 4.8 g/dL (ref 3.5–5.0)
Alkaline Phosphatase: 129 U/L — ABNORMAL HIGH (ref 38–126)
Anion gap: 10 (ref 5–15)
BUN: 7 mg/dL (ref 6–20)
CO2: 22 mmol/L (ref 22–32)
Calcium: 9.5 mg/dL (ref 8.9–10.3)
Chloride: 106 mmol/L (ref 98–111)
Creatinine, Ser: 0.66 mg/dL (ref 0.44–1.00)
GFR calc Af Amer: >60 mL/min (ref >60)
GFR calc non Af Amer: >60 mL/min (ref >60)
Glucose, Bld: 106 mg/dL — ABNORMAL HIGH (ref 70–99)
Potassium: 3.7 mmol/L (ref 3.5–5.1)
Sodium: 138 mmol/L (ref 135–145)
Total Bilirubin: 0.5 mg/dL (ref 0.3–1.2)
Total Protein: 7.7 g/dL (ref 6.5–8.1)

## 2018-05-24 LAB — CBC WITH DIFFERENTIAL/PLATELET
Abs Immature Granulocytes: 0.04 10*3/uL (ref 0.00–0.07)
Basophils Absolute: 0 10*3/uL (ref 0.0–0.1)
Basophils Relative: 0 %
Eosinophils Absolute: 0 10*3/uL (ref 0.0–0.5)
Eosinophils Relative: 0 %
HCT: 43.7 % (ref 36.0–46.0)
Hemoglobin: 13.9 g/dL (ref 12.0–15.0)
Immature Granulocytes: 0 %
Lymphocytes Relative: 23 %
Lymphs Abs: 3 10*3/uL (ref 0.7–4.0)
MCH: 29.6 pg (ref 26.0–34.0)
MCHC: 31.8 g/dL (ref 30.0–36.0)
MCV: 93.2 fL (ref 80.0–100.0)
Monocytes Absolute: 0.9 10*3/uL (ref 0.1–1.0)
Monocytes Relative: 7 %
Neutro Abs: 8.7 10*3/uL — ABNORMAL HIGH (ref 1.7–7.7)
Neutrophils Relative %: 70 %
Platelets: 293 10*3/uL (ref 150–400)
RBC: 4.69 MIL/uL (ref 3.87–5.11)
RDW: 12.8 % (ref 11.5–15.5)
WBC: 12.7 10*3/uL — ABNORMAL HIGH (ref 4.0–10.5)
nRBC: 0 % (ref 0.0–0.2)

## 2018-05-24 LAB — ETHANOL: Alcohol, Ethyl (B): <10 mg/dL (ref <10)

## 2018-05-24 LAB — I-STAT BETA HCG BLOOD, ED (MC, WL, AP ONLY): I-stat hCG, quantitative: <5 m[IU]/mL (ref <5)
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
## 2018-05-24 NOTE — ED Notes (Signed)
Sitter at bedside, sts pt ask for toothbrush and toothpaste.  Pt was given supplies is currently resting in room.  Will continue to monitor.

## 2018-05-24 NOTE — ED Notes (Signed)
Pt walking in hallway feeling restless and upset about having to be here. Asking for new socks, apple juice and a shower. Pt given socks and juice and told we couldn't do a shower right now. Pt escorted back to her room cooperatively but tearful. Awaiting notes from TTS

## 2018-05-24 NOTE — ED Notes (Signed)
Patient frequently coming out of the room and coming to the desk saying, "Can I leave now?" explained to patient each time that she was waiting to talk to TTS. Patient would return to her room without incidence.

## 2018-05-24 NOTE — BH Assessment (Addendum)
Tele Assessment Note   Patient Name: [name] MRN: [id number] Referring Physician: Sharen Heck, PA Location of Patient: [facility] Location of Provider: Behavioral Health TTS Department  [name] is an 38 y.o. female presenting to ED with evaluation of "stress". Per ED, parents reported patient has had worsening paranoia for past 3x days. Upon assessment patient did not know she was at the ED, patient stated, "I am in a regular room where am I?". Patient displaying outburst of crying though out assessment. Patient then started crying stating I don't want to be here. Patient continued to take long pauses in between questions. Patient has odd affect frequently staring off and did not answer some of the questions, until being asked 2-3x and then eventually answering. Patient reported not taken psychiatric medicines for past 3 weeks and can not tell why. Patient thought blocking through out assessment. Patient denied SI, SI attempts, or self-harming. Patient did not remember inpatient treatment 09/2017, per medical record [facility] for paranoia and delusional. Patient stated, "I don't how much I been sleeping because I am not sleeping". Patient complains of "I am dry, dehydrated, stomach hurts and have not been to sleep".    Patient is currently living with her mother and stepfather. Patient reported being employed at [employer] and that it is a stressful job. Patient denied using any outpatient mental health services. Patient denied guns and weapons in the house. Patient denied SI, HI, psychosis, alcohol/drug usage. Patient denied any collateral contact.    UDS +marijuana ETOH negative  Diagnosis: Bipolar I disorder, single manic episode, severe, with psychosis [facility]  Past Medical History:  Past Medical History:  Diagnosis Date  . ADD 10/22/2009  . ALLERGIC RHINITIS 09/26/2008  . Anemia   . ANXIETY 09/26/2008  . Asthma   . Complication of anesthesia    "tempermental" per patient  .  CONSTIPATION 09/26/2008  . DEPRESSION 09/26/2008  . FATIGUE 01/14/2009  . Headache   . HYPERLIPIDEMIA 09/26/2008  . OTITIS MEDIA, LEFT 01/14/2009  . SKIN LESION 09/26/2008  . Sleep apnea   . TONSILLITIS, ACUTE 01/14/2009  . Unconscious state (HCC) 10/13/2017  . URI 10/22/2009    Past Surgical History:  Procedure Laterality Date  . CERVICAL POLYPECTOMY  2009  . DILATATION & CURETTAGE/HYSTEROSCOPY WITH MYOSURE N/A 11/17/2017   Procedure: DILATATION & CURETTAGE/HYSTEROSCOPY WITH MYOSURE;  Surgeon: Geryl Rankins, MD;  Location: [facility];  Service: Gynecology;  Laterality: N/A;  . extraction of wisdom teeth     error in charting  . polyp removal       Family History:  Family History  Problem Relation Age of Onset  . Diabetes Other   . Hypertension Other   . Thyroid disease Mother   . Hypertension Mother     Social History:  reports that she has been smoking cigarettes. She has a 10.00 pack-year smoking history. She has never used smokeless tobacco. She reports previous alcohol use. She reports that she does not use drugs.  Additional Social History:  Alcohol / Drug Use Pain Medications: see MAR Prescriptions: see MAR Over the Counter: see MAR  CIWA: CIWA-Ar BP: (!) 150/88 Pulse Rate: 99 COWS:    Allergies: No Known Allergies  Home Medications: (Not in a hospital admission)   OB/GYN Status:  No LMP recorded.  General Assessment Data Location of Assessment: [facility] TTS Assessment: In system Is this a Tele or Face-to-Face Assessment?: Tele Assessment Is this an Initial Assessment or a Re-assessment for this encounter?: Initial  Assessment Patient Accompanied by:: N/A Language Other than English: No Living Arrangements: (family home) What gender do you identify as?: Female Marital status: Single Pregnancy Status: Unknown Living Arrangements: Parent(mother and stepdad) Can pt return to current living arrangement?: Yes Admission Status: Voluntary Is patient capable of  signing voluntary admission?: Yes Referral Source: Self/Family/Friend     Crisis Care Plan Living Arrangements: Parent(mother and stepdad) Legal Guardian: (self) Name of Psychiatrist: (none) Name of Therapist: (none)  Education Status Is patient currently in school?: No Is the patient employed, unemployed or receiving disability?: Employed  Risk to self with the past 6 months Suicidal Ideation: No Has patient been a risk to self within the past 6 months prior to admission? : No Suicidal Intent: No Has patient had any suicidal intent within the past 6 months prior to admission? : No Is patient at risk for suicide?: No Suicidal Plan?: No Has patient had any suicidal plan within the past 6 months prior to admission? : No Access to Means: No What has been your use of drugs/alcohol within the last 12 months?: (denied) Previous Attempts/Gestures: No How many times?: (0) Other Self Harm Risks: (none reported) Triggers for Past Attempts: (denied) Intentional Self Injurious Behavior: None Family Suicide History: No Recent stressful life event(s): (fatigued) Persecutory voices/beliefs?: No Depression: Yes Depression Symptoms: Fatigue(increased anxiety) Substance abuse history and/or treatment for substance abuse?: No Suicide prevention information given to non-admitted patients: Not applicable  Risk to Others within the past 6 months Homicidal Ideation: No Does patient have any lifetime risk of violence toward others beyond the six months prior to admission? : No Thoughts of Harm to Others: No Current Homicidal Intent: No Current Homicidal Plan: No Access to Homicidal Means: No Identified Victim: (n/a) History of harm to others?: No Assessment of Violence: None Noted Violent Behavior Description: (none reported) Does patient have access to weapons?: No Criminal Charges Pending?: No Does patient have a court date: No Is patient on probation?: No  Psychosis Hallucinations:  None noted Delusions: None noted  Mental Status Report Appearance/Hygiene: Unremarkable Eye Contact: Poor Motor Activity: Freedom of movement Speech: Slow, Incoherent(brief moments of incoherence) Level of Consciousness: Crying Mood: Labile, Anxious Affect: Apprehensive, Flat Anxiety Level: Moderate Thought Processes: Thought Blocking Judgement: Impaired Orientation: Person, Time Obsessive Compulsive Thoughts/Behaviors: None  Cognitive Functioning Concentration: Poor Memory: Remote Impaired, Recent Impaired Is patient IDD: No Insight: Poor Impulse Control: Poor Appetite: Poor Have you had any weight changes? : No Change Sleep: Decreased Total Hours of Sleep: ("I don't know because I don't sleep") Vegetative Symptoms: Unable to Assess  ADLScreening [facility]) Patient's cognitive ability adequate to safely complete daily activities?: Yes Patient able to express need for assistance with ADLs?: Yes Independently performs ADLs?: Yes (appropriate for developmental age)  Prior Inpatient Therapy Prior Inpatient Therapy: Yes Prior Therapy Dates: (09/2017) Prior Therapy Facilty/Provider(s): [facility]) Reason for Treatment: (paranoia and delusional thinking)  Prior Outpatient Therapy Prior Outpatient Therapy: No Does patient have an ACCT team?: No Does patient have Intensive In-House Services?  : No Does patient have [facility] services? : No Does patient have P4CC services?: No  ADL Screening (condition at time of admission) Patient's cognitive ability adequate to safely complete daily activities?: Yes Patient able to express need for assistance with ADLs?: Yes Independently performs ADLs?: Yes (appropriate for developmental age)  [employer]chant [employer] officer (For Healthcare) Does Patient Have a Medical Advance Directive?: No   Disposition:  Disposition Initial Assessment Completed for this Encounter: Yes  Shuvon Rankin, NP, patient  meets inpatient criteria. TTS to secure  placement. RN, informed of disposition.  This service was provided via telemedicine using a 2-way, interactive audio and video technology.  Names of all persons participating in this telemedicine service and their role in this encounter.  Name: Hassan Rowanmanda Ndiaye Role: Al CorpusLatisha Renley Banwart, [facility]  Name: Al CorpusLatisha Azavion Bouillon, [facility] Role: TTS Clinician  Name:  Role:     Burnetta SabinLatisha D Kemet Nijjar, [facility] 05/24/2018 7:11 PM

## 2018-05-24 NOTE — ED Notes (Signed)
Pt updated on plan of care and bed search process. Pt upset and tearful but cooperative. Given a sandwich. NAD

## 2018-05-24 NOTE — ED Triage Notes (Signed)
Per EMS, patient from home, family reports patient has had worsening paranoia x3 days. States she has not been taking psych meds for unknown amount of time. Denies SI/HI.

## 2018-05-24 NOTE — ED Notes (Signed)
Shuvon Rankin, NP, patient meets inpatient criteria. TTS to secure placement. RN, informed of disposition.

## 2018-05-24 NOTE — ED Notes (Signed)
Patient asked several times. "Can I go home." EDPA notified.

## 2018-05-24 NOTE — ED Notes (Signed)
Bed: [facility] Expected date:  Expected time:  Means of arrival:  Comments: 

## 2018-05-25 ENCOUNTER — Inpatient Hospital Stay (HOSPITAL_COMMUNITY)
Admission: AD | Admit: 2018-05-25 | Discharge: 2018-05-31 | DRG: 885 | Disposition: A | Discharge status: Home / Self Care | Payer: BLUE CROSS/BLUE SHIELD | Attending: Psychiatry | Admitting: Psychiatry

## 2018-05-25 ENCOUNTER — Other Ambulatory Visit: Payer: Self-pay | Admitting: Behavioral Health

## 2018-05-25 ENCOUNTER — Other Ambulatory Visit: Payer: Self-pay

## 2018-05-25 ENCOUNTER — Encounter (HOSPITAL_COMMUNITY): Payer: Self-pay | Admitting: *Deleted

## 2018-05-25 DIAGNOSIS — F1210 Cannabis abuse, uncomplicated: Secondary | ICD-10-CM | POA: Diagnosis present

## 2018-05-25 DIAGNOSIS — Y92009 Unspecified place in unspecified non-institutional (private) residence as the place of occurrence of the external cause: Secondary | ICD-10-CM | POA: Diagnosis not present

## 2018-05-25 DIAGNOSIS — T43605A Adverse effect of unspecified psychostimulants, initial encounter: Secondary | ICD-10-CM | POA: Diagnosis present

## 2018-05-25 DIAGNOSIS — F319 Bipolar disorder, unspecified: Secondary | ICD-10-CM | POA: Diagnosis not present

## 2018-05-25 DIAGNOSIS — E785 Hyperlipidemia, unspecified: Secondary | ICD-10-CM | POA: Diagnosis present

## 2018-05-25 DIAGNOSIS — Z9114 Patient's other noncompliance with medication regimen: Secondary | ICD-10-CM | POA: Diagnosis not present

## 2018-05-25 DIAGNOSIS — F419 Anxiety disorder, unspecified: Secondary | ICD-10-CM | POA: Diagnosis present

## 2018-05-25 DIAGNOSIS — Z791 Long term (current) use of non-steroidal anti-inflammatories (NSAID): Secondary | ICD-10-CM | POA: Diagnosis not present

## 2018-05-25 DIAGNOSIS — G4730 Sleep apnea, unspecified: Secondary | ICD-10-CM | POA: Diagnosis present

## 2018-05-25 DIAGNOSIS — F29 Unspecified psychosis not due to a substance or known physiological condition: Secondary | ICD-10-CM | POA: Diagnosis not present

## 2018-05-25 DIAGNOSIS — J45909 Unspecified asthma, uncomplicated: Secondary | ICD-10-CM | POA: Diagnosis present

## 2018-05-25 DIAGNOSIS — F17210 Nicotine dependence, cigarettes, uncomplicated: Secondary | ICD-10-CM | POA: Diagnosis present

## 2018-05-25 DIAGNOSIS — F312 Bipolar disorder, current episode manic severe with psychotic features: Secondary | ICD-10-CM | POA: Diagnosis present

## 2018-05-25 DIAGNOSIS — G4700 Insomnia, unspecified: Secondary | ICD-10-CM | POA: Diagnosis present

## 2018-05-25 DIAGNOSIS — Z79899 Other long term (current) drug therapy: Secondary | ICD-10-CM

## 2018-05-25 MED ORDER — ACETAMINOPHEN 325 MG PO TABS: 650.0000 mg | ORAL_TABLET | Freq: Four times a day (QID) | ORAL | Status: DC | PRN

## 2018-05-25 MED ORDER — PNEUMOCOCCAL VAC POLYVALENT 25 MCG/0.5ML IJ INJ: 0.5000 mL | INJECTION | INTRAMUSCULAR | Status: DC

## 2018-05-25 MED ORDER — MAGNESIUM HYDROXIDE 400 MG/5ML PO SUSP
30.0000 mL | Freq: Every day | ORAL | Status: DC | PRN
  Administered 2018-05-25 – 2018-05-29 (×3): 30 mL via ORAL
  Filled 2018-05-25 (×3): qty 30

## 2018-05-25 MED ORDER — HYDROXYZINE HCL 50 MG PO TABS
50.0000 mg | ORAL_TABLET | Freq: Four times a day (QID) | ORAL | Status: DC | PRN
  Administered 2018-05-25 – 2018-05-30 (×4): 50 mg via ORAL
  Filled 2018-05-25 (×3): qty 1

## 2018-05-25 MED ORDER — LORAZEPAM 2 MG/ML IJ SOLN
2.0000 mg | Freq: Once | INTRAMUSCULAR | Status: AC
  Administered 2018-05-25: 2 mg via INTRAMUSCULAR
  Filled 2018-05-25: qty 1

## 2018-05-25 MED ORDER — STERILE WATER FOR INJECTION IJ SOLN
INTRAMUSCULAR | Status: AC
  Administered 2018-05-25: 1.2 mL
  Filled 2018-05-25: qty 10

## 2018-05-25 MED ORDER — ALUM & MAG HYDROXIDE-SIMETH 200-200-20 MG/5ML PO SUSP: 30.0000 mL | ORAL | Status: DC | PRN

## 2018-05-25 MED ORDER — TRAZODONE HCL 100 MG PO TABS
100.0000 mg | ORAL_TABLET | Freq: Every evening | ORAL | Status: DC | PRN
  Administered 2018-05-25 – 2018-05-29 (×3): 100 mg via ORAL
  Filled 2018-05-25: qty 1
  Filled 2018-05-25: qty 2

## 2018-05-25 MED ORDER — ZIPRASIDONE MESYLATE 20 MG IM SOLR
20.0000 mg | Freq: Once | INTRAMUSCULAR | Status: AC
  Administered 2018-05-25: 07:00:00 20 mg via INTRAMUSCULAR

## 2018-05-25 MED ORDER — ZIPRASIDONE MESYLATE 20 MG IM SOLR
INTRAMUSCULAR | Status: AC
  Administered 2018-05-25: 20 mg via INTRAMUSCULAR
  Filled 2018-05-25: qty 20

## 2018-05-25 NOTE — ED Notes (Signed)
Patient escorted by [employer] to [facility]. Patient IVC paperwork and belongings sent with [employer]. Patient VSS and cooperative at this time.

## 2018-05-25 NOTE — ED Notes (Addendum)
Pt attempted to leave facility. Multiple staff members and security attempting to redirect pt; however, pt still attempting multiple times to leave facility. Provider made aware and pt is now [facility]. Pt taken back to room. Sitter and security at bedside

## 2018-05-25 NOTE — ED Notes (Signed)
Upon coming onto this shift, patient was transferred from room 08 to room 15 due to multiple attempts at escape. Security required at patient door to re-escort patient back into her room. Patient uncooperative and agitated. This RN introduced self and re-oriented patient to her IVC status. Patient updated on plan of care. Patient remains paranoid with delusions of grandeur, stating she "has to go complete (her) mission from the [employer] really quick" or "you can't keep me here, it isn't safe." Patient asks this RN to stop speaking mid-sentence and looks as if she is listening to something, but when asked if she is hearing voices, patient denies and says "Im just thinking." Patient unable to be redirected and continues to try to force herself out of the room, past the security guards, and must be physically guided back into the room. EDMD Pickering notified of patient increasing agitation. IM medication ordered.

## 2018-05-25 NOTE — Progress Notes (Signed)
D: Pt denies SI/HI/AVH. Pt is pleasant and cooperative. Pt stated she was doing ok. Pt visible on the unit. Pt presented very paranoid.   A: Pt was offered support and encouragement. Pt was given scheduled medications. Pt was encourage to attend groups. Q 15 minute checks were done for safety.   R:Pt attends groups and interacts well with peers and staff. Pt is taking medication. Pt has no complaints.Pt receptive to treatment and safety maintained on unit.   Problem: Education: Goal: Emotional status will improve Outcome: Progressing   Problem: Education: Goal: Mental status will improve Outcome: Progressing

## 2018-05-25 NOTE — ED Provider Notes (Addendum)
4:30 AM  Pt becoming increasingly agitated and difficult to redirect.  Patient trying to leave the emergency department.  Here for acute psychosis with worsening paranoia, responding to internal stimuli, medical noncompliance with psychiatric meds now having an acute psychiatric decompensation.  Patient redirected back to room by security and given 2 mg of IM Ativan.  I have placed her under IVC.   Latondra Gebhart, Layla Maw, DO 05/25/18 0437   7:05 AM  Pt has increasing agitation after being moved into a new room.  Unable to redirect patient.  Will give IM Geodon.   CRITICAL CARE Performed by: Rochele Raring   Total critical care time: 45 minutes  Critical care time was exclusive of separately billable procedures and treating other patients.  Critical care was necessary to treat or prevent imminent or life-threatening deterioration.  Critical care was time spent personally by me on the following activities: development of treatment plan with patient and/or surrogate as well as nursing, discussions with consultants, evaluation of patient's response to treatment, examination of patient, obtaining history from patient or surrogate, ordering and performing treatments and interventions, ordering and review of laboratory studies, ordering and review of radiographic studies, pulse oximetry and re-evaluation of patient's condition.    Demitrus Francisco, Layla Maw, DO 05/25/18 [phone]

## 2018-05-25 NOTE — Tx Team (Signed)
Initial Treatment Plan 05/25/2018 4:09 PM [name] OZD:[id number]    PATIENT STRESSORS: Medication change or noncompliance   PATIENT STRENGTHS: Ability for insight Average or above average intelligence   PATIENT IDENTIFIED PROBLEMS: "Increased communication with parents"  "anxiety"                   DISCHARGE CRITERIA:  Ability to meet basic life and health needs Adequate post-discharge living arrangements Improved stabilization in mood, thinking, and/or behavior  PRELIMINARY DISCHARGE PLAN: Outpatient therapy  PATIENT/FAMILY INVOLVEMENT: This treatment plan has been presented to and reviewed with the patient, [name]. The patient and family have been given the opportunity to ask questions and make suggestions.  Dewayne Shorter, RN 05/25/2018, 4:09 PM

## 2018-05-25 NOTE — ED Notes (Signed)
Pt resting in bed watching tv. Sitter at bedside. VSS. Awaiting placement.

## 2018-05-25 NOTE — Progress Notes (Signed)
Patient ID: [name], female   DOB: February 25, 1980, 38 y.o.   MRN: [id number] [facility] Assessment:  [name] is an 38 y.o. female presenting to ED with evaluation of "stress". Per ED, parents reported patient has had worsening paranoia for past 3x days. Upon assessment patient did not know she was at the ED, patient stated, "I am in a regular room where am I?". Patient displaying outburst of crying though out assessment. Patient then started crying stating I don't want to be here. Patient continued to take long pauses in between questions. Patient has odd affect frequently staring off and did not answer some of the questions, until being asked 2-3x and then eventually answering. Patient reported not taken psychiatric medicines for past 3 weeks and can not tell why. Patient thought blocking through out assessment. Patient denied SI, SI attempts, or self-harming. Patient did not remember inpatient treatment 09/2017, per medical record [facility] for paranoia and delusional. Patient stated, "I don't how much I been sleeping because I am not sleeping". Patient complains of "I am dry, dehydrated, stomach hurts and have not been to sleep".    Patient is currently living with her mother and stepfather. Patient reported being employed at [employer] and that it is a stressful job. Patient denied using any outpatient mental health services. Patient denied guns and weapons in the house. Patient denied SI, HI, psychosis, alcohol/drug usage. Patient denied any collateral contact.    UDS +marijuana  NSG: Pt presents to unit cooperative, incongruent with eye contact brief, avertive. Pt c/o decreased sleep, increased anxiety with h/o panic attacks. Pt denies s.i. and states that she really does not know why she's here. Pt says that her parents are her main stressor and that she wants to work on [employer]. Pt has had one prior [facility] admission in 09/2017. Pt denies drug or alcohol use. UDS positive for  marijuana. Pt verbally contracts for safety. Pt refused to identify an emergency contact or to fill out phone list. Pt oriented to unit, staff, and program. On the unit pt has been intrusive, fidgety.

## 2018-05-25 NOTE — ED Notes (Signed)
Report called to [facility] at [facility]. Due to IVC status, patient to be transported via [employer] to [facility] bed 503-2. [employer] transportation request called in. Awaiting transport.

## 2018-05-25 NOTE — ED Notes (Signed)
Pt ambulated to the bathroom with no assistance. Gait steady. With sitter

## 2018-05-26 DIAGNOSIS — F319 Bipolar disorder, unspecified: Secondary | ICD-10-CM

## 2018-05-26 LAB — LIPID PANEL
Cholesterol: 138 mg/dL (ref 0–200)
HDL: 49 mg/dL (ref >40)
LDL Cholesterol: 71 mg/dL (ref 0–99)
Total CHOL/HDL Ratio: 2.8 RATIO
Triglycerides: 90 mg/dL (ref <150)
VLDL: 18 mg/dL (ref 0–40)

## 2018-05-26 LAB — HEMOGLOBIN A1C
Hgb A1c MFr Bld: 5.1 % (ref 4.8–5.6)
Mean Plasma Glucose: 99.67 mg/dL

## 2018-05-26 LAB — TSH: TSH: 2.555 u[IU]/mL (ref 0.350–4.500)

## 2018-05-26 MED ORDER — QUETIAPINE FUMARATE 200 MG PO TABS
200.0000 mg | ORAL_TABLET | Freq: Every day | ORAL | Status: DC
  Filled 2018-05-26 (×3): qty 1

## 2018-05-26 MED ORDER — RISPERIDONE 3 MG PO TABS
3.0000 mg | ORAL_TABLET | Freq: Two times a day (BID) | ORAL | Status: DC
  Administered 2018-05-28: 3 mg via ORAL
  Filled 2018-05-26 (×8): qty 1

## 2018-05-26 MED ORDER — ARIPIPRAZOLE ER 400 MG IM SRER: 400.0000 mg | INTRAMUSCULAR | Status: DC

## 2018-05-26 MED ORDER — BENZTROPINE MESYLATE 0.5 MG PO TABS
0.5000 mg | ORAL_TABLET | Freq: Two times a day (BID) | ORAL | Status: DC
  Administered 2018-05-28 – 2018-05-31 (×7): 0.5 mg via ORAL
  Filled 2018-05-26 (×15): qty 1

## 2018-05-26 NOTE — Progress Notes (Signed)
Pt up on the unit asking for various things, pt presents as very attention seeking at this time

## 2018-05-26 NOTE — Progress Notes (Signed)
Pt did not attend orientation/goals group this morning.   

## 2018-05-26 NOTE — Progress Notes (Signed)
Recreation Therapy Notes  Date: 4.30.20 Time:  1000 Location: 500 Hall Dayroom  Group Topic: Communication, Team Building, Problem Solving  Goal Area(s) Addresses:  Patient will effectively work with peer towards shared goal.  Patient will identify skills used to make activity successful.  Patient will identify how skills used during activity can be used to reach post d/c goals.   Behavioral Response: None  Intervention: STEM Activity  Activity: Landing Pad. In teams patients were given 12 plastic drinking straws and a length of masking tape. Using the materials provided patients were asked to build a landing pad to catch a golf ball dropped from approximately 6 feet in the air.   Education: Pharmacist, community, Discharge Planning   Education Outcome: Acknowledges education/In group clarification offered/Needs additional education.   Clinical Observations/Feedback: Pt arrived late to group.  Pt did not participate.  Pt sat and observed before leaving and not returning.    Caroll Rancher, LRT/CTRS      Lillia Abed, Rosmery Duggin A 05/26/2018 11:03 AM

## 2018-05-26 NOTE — H&P (Signed)
Psychiatric Admission Assessment Adult  Patient Identification: [name] MRN:  [id number] Date of Evaluation:  05/26/2018 Chief Complaint:  Bipolar 1 Disorder; Manic with psychosis Principal Diagnosis: Bipolar with psychosis and intense paranoia Diagnosis:  Active Problems:   Bipolar 1 disorder (HCC)  History of Present Illness:  This is a repeat admission for this 38 year old patient last here in September 2019.  He is known to have a diagnosis of bipolar disorder with a history of psychosis.  She is quite paranoid on this interview in fact will not speak to me in the office will not speak to me in the room even with an escort, states he must speak in the hallway and then halfway through the interview states she will not talk further when on an attorney present.  She had required petition back in 2019 as well and at that point was quite paranoid believes she is being followed, poison, spied on and so forth had flight of ideas insomnia and was treated with a combination of Lexapro 20 mg and Abilify 15 mg a day.  Current home med list includes Adderall at doses of 30 mg day - a continuation of Abilify if she is compliant as well as Lexapro. On her presentation yesterday she was noted to have thought blocking, intense paranoia, some confusion as to her situation, reports involved insomnia, cannabis abuse, and some mild degree of disorganization of thought and behavior-  Since on the floor is been a bit intrusive to the staff making odd requests and so forth so see recent notes again the patient will not make good eye contact she continues to be paranoid so paranoid that she will not trust an interview process stating "I do not trust you".  There is no much I can do with this no amount of reassurance will get her to give me a full her interview that we have above  Associated Signs/Symptoms: Depression Symptoms:  psychomotor agitation, (Hypo) Manic Symptoms:  Delusions, Anxiety Symptoms:   Excessive Worry, Psychotic Symptoms:  Delusions, PTSD Symptoms: NA Total Time spent with patient: 45 minutes  Past Psychiatric History: Prior or similar presentations  Is the patient at risk to self? Yes.    Has the patient been a risk to self in the past 6 months? No.  Has the patient been a risk to self within the distant past? Yes.    Is the patient a risk to others? No.  Has the patient been a risk to others in the past 6 months? No.  Has the patient been a risk to others within the distant past? No.   Alcohol Screening: Patient refused Alcohol Screening Tool: Yes 1. How often do you have a drink containing alcohol?: Never 2. How many drinks containing alcohol do you have on a typical day when you are drinking?: 1 or 2 3. How often do you have six or more drinks on one occasion?: Never AUDIT-C Score: 0 Alcohol Brief Interventions/Follow-up: Patient Refused Substance Abuse History in the last 12 months:  Yes.   Consequences of Substance Abuse: NA Previous Psychotropic Medications: Yes  Psychological Evaluations: No  Past Medical History:  Past Medical History:  Diagnosis Date  . ADD 10/22/2009  . ALLERGIC RHINITIS 09/26/2008  . Anemia   . ANXIETY 09/26/2008  . Asthma   . Complication of anesthesia    "tempermental" per patient  . CONSTIPATION 09/26/2008  . DEPRESSION 09/26/2008  . FATIGUE 01/14/2009  . Headache   . HYPERLIPIDEMIA 09/26/2008  . OTITIS  MEDIA, LEFT 01/14/2009  . SKIN LESION 09/26/2008  . Sleep apnea   . TONSILLITIS, ACUTE 01/14/2009  . Unconscious state (HCC) 10/13/2017  . URI 10/22/2009    Past Surgical History:  Procedure Laterality Date  . CERVICAL POLYPECTOMY  2009  . DILATATION & CURETTAGE/HYSTEROSCOPY WITH MYOSURE N/A 11/17/2017   Procedure: DILATATION & CURETTAGE/HYSTEROSCOPY WITH MYOSURE;  Surgeon: Geryl Rankins, MD;  Location: [facility];  Service: Gynecology;  Laterality: N/A;  . extraction of wisdom teeth     error in charting  . polyp removal       Family History:  Family History  Problem Relation Age of Onset  . Diabetes Other   . Hypertension Other   . Thyroid disease Mother   . Hypertension Mother    Family Psychiatric  History: ukn Tobacco Screening: Have you used any form of tobacco in the last 30 days? (Cigarettes, Smokeless Tobacco, Cigars, and/or Pipes): Yes Tobacco use, Select all that apply: 5 or more cigarettes per day Are you interested in Tobacco Cessation Medications?: No, patient refused Counseled patient on smoking cessation including recognizing danger situations, developing coping skills and basic information about quitting provided: Refused/Declined practical counseling Social History:  Social History   Substance and Sexual Activity  Alcohol Use Not Currently     Social History   Substance and Sexual Activity  Drug Use No    Additional Social History:      Longest period of sobriety (when/how long): six months  Negative Consequences of Use: Personal relationships                    Allergies:  No Known Allergies Lab Results:  Results for orders placed or performed during the hospital encounter of 05/25/18 (from the past 48 hour(s))  Hemoglobin A1c     Status: None   Collection Time: 05/26/18  7:31 AM  Result Value Ref Range   Hgb A1c MFr Bld 5.1 4.8 - 5.6 %    Comment: (NOTE) Pre diabetes:          5.7%-6.4% Diabetes:              >6.4% Glycemic control for   <7.0% adults with diabetes    Mean Plasma Glucose 99.67 mg/dL    Comment: Performed at [facility], [address], [place], [place] [address]  Lipid panel     Status: None   Collection Time: 05/26/18  7:31 AM  Result Value Ref Range   Cholesterol 138 0 - 200 mg/dL   Triglycerides 90 <604 mg/dL   HDL 49 >54 mg/dL   Total CHOL/HDL Ratio 2.8 RATIO   VLDL 18 0 - 40 mg/dL   LDL Cholesterol 71 0 - 99 mg/dL    Comment:        Total Cholesterol/HDL:CHD Risk Coronary Heart Disease Risk Table                     Men    Women  1/2 Average Risk   3.4   3.3  Average Risk       5.0   4.4  2 X Average Risk   9.6   7.1  3 X Average Risk  23.4   11.0        Use the calculated Patient Ratio above and the CHD Risk Table to determine the patient's CHD Risk.        ATP III CLASSIFICATION (LDL):  <100     mg/dL  Optimal  100-129  mg/dL   Near or Above                    Optimal  130-159  mg/dL   Borderline  [id number]  mg/dL   High  >045>190     mg/dL   Very High Performed at [facility], [address] [place], [place] [address]   TSH     Status: None   Collection Time: 05/26/18  7:31 AM  Result Value Ref Range   TSH 2.555 0.350 - 4.500 uIU/mL    Comment: Performed by a 3rd Generation assay with a functional sensitivity of <=0.01 uIU/mL. Performed at [facility], [address] [place], [place] [address]     Blood Alcohol level:  Lab Results  Component Value Date   ETH <10 05/24/2018   ETH <10 10/13/2017    Metabolic Disorder Labs:  Lab Results  Component Value Date   HGBA1C 5.1 05/26/2018   MPG 99.67 05/26/2018   No results found for: PROLACTIN Lab Results  Component Value Date   CHOL 138 05/26/2018   TRIG 90 05/26/2018   HDL 49 05/26/2018   CHOLHDL 2.8 05/26/2018   VLDL 18 05/26/2018   LDLCALC 71 05/26/2018   LDLCALC 104 (H) 03/11/2017    Current Medications: Current Facility-Administered Medications  Medication Dose Route Frequency Provider Last Rate Last Dose  . acetaminophen (TYLENOL) tablet 650 mg  650 mg Oral Q6H PRN Denzil Magnusonhomas, Lashunda, NP      . alum & mag hydroxide-simeth (MAALOX/MYLANTA) 200-200-20 MG/5ML suspension 30 mL  30 mL Oral Q4H PRN Denzil Magnusonhomas, Lashunda, NP      . benztropine (COGENTIN) tablet 0.5 mg  0.5 mg Oral BID Malvin JohnsFarah, Polette Nofsinger, MD      . hydrOXYzine (ATARAX/VISTARIL) tablet 50 mg  50 mg Oral Q6H PRN Donell SievertSimon, Spencer E, PA-C   50 mg at 05/25/18 2307  . magnesium hydroxide (MILK OF MAGNESIA) suspension 30 mL  30 mL Oral Daily PRN  Denzil Magnusonhomas, Lashunda, NP   30 mL at 05/25/18 1832  . pneumococcal 23 valent vaccine (PNU-IMMUNE) injection 0.5 mL  0.5 mL Intramuscular Tomorrow-1000 Malvin JohnsFarah, Jadier Rockers, MD      . QUEtiapine (SEROQUEL) tablet 200 mg  200 mg Oral QHS Malvin JohnsFarah, Amarien Carne, MD      . risperiDONE (RISPERDAL) tablet 3 mg  3 mg Oral BID Malvin JohnsFarah, Eliott Amparan, MD      . traZODone (DESYREL) tablet 100 mg  100 mg Oral QHS PRN,MR X 1 Kerry HoughSimon, Spencer E, PA-C   100 mg at 05/25/18 2118   PTA Medications: Medications Prior to Admission  Medication Sig Dispense Refill Last Dose  . amphetamine-dextroamphetamine (ADDERALL) 10 MG tablet Take 1 tablet (10 mg total) by mouth daily after lunch. 30 tablet 0   . amphetamine-dextroamphetamine (ADDERALL) 20 MG tablet Take 1 tablet (20 mg total) by mouth daily before breakfast. 30 tablet 0   . clonazePAM (KLONOPIN) 0.5 MG tablet TAKE 1 TO 2 TABLETS BY MOUTH DAILY AS NEEDED FOR ANXIETY 60 tablet 2   . cyclobenzaprine (FLEXERIL) 5 MG tablet Take 1 tablet (5 mg total) by mouth 3 (three) times daily as needed for muscle spasms. 30 tablet 0   . escitalopram (LEXAPRO) 10 MG tablet Take 10 mg by mouth daily.     Marland Kitchen. albuterol (PROVENTIL HFA;VENTOLIN HFA) 108 (90 Base) MCG/ACT inhaler Inhale 2 puffs into the lungs every 6 (six) hours as needed for wheezing or shortness of breath. (Patient not  taking: Reported on 05/26/2018) 1 Inhaler 2 Not Taking at Unknown time  . ARIPiprazole (ABILIFY) 15 MG tablet Take 1 tablet (15 mg total) by mouth daily. For mood control (Patient not taking: Reported on 05/26/2018) 30 tablet 0 Not Taking at Unknown time  . azithromycin (ZITHROMAX Z-PAK) 250 MG tablet 2 tab by mouth day 1, then 1 per day (Patient not taking: Reported on 05/26/2018) 6 tablet 1 Completed Course at Unknown time  . hydrOXYzine (ATARAX/VISTARIL) 50 MG tablet Take 1 tablet (50 mg total) by mouth every 6 (six) hours as needed for anxiety. (Patient not taking: Reported on 05/26/2018) 60 tablet 0 Not Taking at Unknown time  . ibuprofen  (ADVIL,MOTRIN) 800 MG tablet Take 1 tablet (800 mg total) by mouth every 8 (eight) hours as needed. (Patient not taking: Reported on 05/26/2018) 10 tablet 0 Completed Course at Unknown time  . meloxicam (MOBIC) 15 MG tablet Take 1 tablet (15 mg total) by mouth daily. For arthritis pain (Patient not taking: Reported on 05/26/2018) 30 tablet 3 Completed Course at Unknown time  . nicotine polacrilex (NICORETTE) 2 MG gum Take 1 each (2 mg total) by mouth as needed for smoking cessation. (May buy from over the counter): GFor smoking cessation (Patient not taking: Reported on 05/26/2018) 100 tablet 0 Not Taking at Unknown time  . traZODone (DESYREL) 50 MG tablet Take 1 tablet (50 mg total) by mouth at bedtime as needed for sleep. (Patient not taking: Reported on 05/26/2018) 30 tablet 0 Not Taking at Unknown time    Musculoskeletal: Strength & Muscle Tone: within normal limits Gait & Station: normal Patient leans: N/A  Psychiatric Specialty Exam: Physical Exam  ROS  Blood pressure (!) 105/58, pulse 96, temperature 98 F (36.7 C), resp. rate 16, last menstrual period 04/24/2018.There is no height or weight on file to calculate BMI.  General Appearance: Casual  Eye Contact:  Good  Speech:  Clear and Coherent  Volume:  Decreased  Mood:  Irritable  Affect:  Congruent  Thought Process:  Disorganized, Irrelevant and Descriptions of Associations: Loose  Orientation:  Full (Time, Place, and Person)  Thought Content:  Illogical, Delusions and Ideas of Reference:   Paranoia  Suicidal Thoughts:  No  Homicidal Thoughts:  No  Memory:  NA  Judgement: poor  Insight:  Lacking  Psychomotor Activity:  Normal  Concentration:  Concentration: Poor  Recall:  Poor  Fund of Knowledge:  Poor  Language:  Poor  Akathisia:  Negative  Handed:  Right  AIMS (if indicated):     Assets:  Physical Health Resilience  ADL's:  Intact  Cognition:  WNL  Sleep:  Number of Hours: 3.75    Treatment Plan Summary: Daily  contact with patient to assess and evaluate symptoms and progress in treatment, Medication management and Plan Mid for stabilization will clearly need long-acting injectable  Observation Level/Precautions:  15 minute checks  Laboratory:  HCG  Psychotherapy: Reality based  Medications: Aripiprazole long-acting  Consultations: Not necessary  Discharge Concerns: Stability and insight  Estimated LOS: 7-10  Other: Axis I bipolar mixed with psychosis/paranoia-cannabis abuse/stimulant usage   Physician Treatment Plan for Primary Diagnosis: <principal problem not specified> Long Term Goal(s): Improvement in symptoms so as ready for discharge  Short Term Goals: Compliance with prescribed medications will improve  Physician Treatment Plan for Secondary Diagnosis: Active Problems:   Bipolar 1 disorder (HCC)  Long Term Goal(s): Improvement in symptoms so as ready for discharge  Short Term Goals: Ability to identify and  develop effective coping behaviors will improve  I certify that inpatient services furnished can reasonably be expected to improve the patient's condition.    Malvin Johns, MD 4/30/202012:51 PM

## 2018-05-26 NOTE — Progress Notes (Signed)
Recreation Therapy Notes  INPATIENT RECREATION THERAPY ASSESSMENT  Patient Details Name: [name] MRN: [id number] DOB: [birth year] Today's Date: 05/26/2018       Information Obtained From: Patient  Able to Participate in Assessment/Interview: Yes  Patient Presentation: (Delusional)  Reason for Admission (Per Patient): Other (Comments)(Pt stated she needed to talk to someone)  Patient Stressors: Other (Comment)(Harrassment)  Coping Skills:   Exercise, Other (Comment)(Rest, swimming, walks, music)  Leisure Interests (2+):  Exercise - Walking, Sports - Swimming, Technical brewer - Other (Comment), Music - Listen(Kayaking)  Frequency of Recreation/Participation: Other (Comment)(Not often)  Awareness of Community Resources:  (Pt refused to answer and started asking to speak to a Clinical research associate)  Expressed Interest in [employer] Information: (Pt refused to answer)  [place] of Residence:  [place]  Patient Main Form of Transportation: (Pt refused to answer)  Patient Strengths:  (Pt refused to answer)  Patient Identified Areas of Improvement:  (Pt refused to answer)  Patient Goal for Hospitalization:  (Pt refused to answer)  Current SI (including self-harm):  (Pt refused to answer)  Current HI:  (Pt refused to answer)  Current AVH: (Pt refused to answer)  Staff Intervention Plan: Group Attendance, Collaborate with Interdisciplinary Treatment Team  Consent to Intern Participation: N/A    Caroll Rancher, LRT/CTRS  Lillia Abed, [name] 05/26/2018, 11:30 AM

## 2018-05-26 NOTE — Progress Notes (Signed)
Pt was up stating she wanted something for constipation, pt was given a Milk of Magnesia. Pt intentionally put her finger in it and stated she could not take that one. Pt was given another one and then she took some then stated she could not take it. Pt proceeded to read the package , then asked when the doctor would be in.

## 2018-05-26 NOTE — [facility] Group Notes (Signed)
[facility] LCSW Group Therapy Note  Date/Time: 05/26/18, 1315  Type of Therapy/Topic:  Group Therapy:  Balance in Life  Participation Level:  none  Description of Group:    This group will address the concept of balance and how it feels and looks when one is unbalanced. Patients will be encouraged to process areas in their lives that are out of balance, and identify reasons for remaining unbalanced. Facilitators will guide patients utilizing problem- solving interventions to address and correct the stressor making their life unbalanced. Understanding and applying boundaries will be explored and addressed for obtaining  and maintaining a balanced life. Patients will be encouraged to explore ways to assertively make their unbalanced needs known to significant others in their lives, using other group members and facilitator for support and feedback.  Therapeutic Goals: 1. Patient will identify two or more emotions or situations they have that consume much of in their lives. 2. Patient will identify signs/triggers that life has become out of balance:  3. Patient will identify two ways to set boundaries in order to achieve balance in their lives:  4. Patient will demonstrate ability to communicate their needs through discussion and/or role plays  Summary of Patient Progress:Pt came to group but left shortly after group started without participating at all.          Therapeutic Modalities:   Cognitive Behavioral Therapy Solution-Focused Therapy Assertiveness Training  Daleen Squ[place], [place]

## 2018-05-26 NOTE — Progress Notes (Signed)
Pt tries to split staff throughout the morning asking different saff for the same thing after she was told not. Pt very paranoid.
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
## 2018-05-26 NOTE — Progress Notes (Signed)
Nursing Progress Note: 7p-7a D: Pt currently presents with a paranoid/staring/intrusive affect and behavior. Pt states "I don't tke meds, but I want neosporin and cough drops and benadryl. I don't want to take sleep meds because there is no telling what that could do to me." Interacting minimaly with the milieu. Pt reports good sleep during the previous night with current medication regimen.  A: Pt refused medications per providers orders. Pt's labs and vitals were monitored throughout the night. Pt supported emotionally and encouraged to express concerns and questions. Pt educated on medications.  R: Pt's safety ensured with 15 minute and environmental checks. Pt currently denies SI, HI, and AVH. Pt verbally contracts to seek staff if SI,HI, or AVH occurs and to consult with staff before acting on any harmful thoughts. Will continue to monitor.

## 2018-05-26 NOTE — Progress Notes (Signed)
Pt up to the nursing station demanding ginger ale and Gatorade. Pt was offered water , but refused. Pt " I'm going to pass out from my low blood sugar " pt presents with manipulative behaviors. " I'm going to pass out from being thirsty" . Pt offered water, but continued to refuse.

## 2018-05-26 NOTE — Progress Notes (Signed)
Pt up doing everything she can not to go to sleep,  pt offered her 2nd dose of Trazodone but pt refused. Pt up in her room not trying to lay down , pt rambling through her stuff and putting on lotion.

## 2018-05-26 NOTE — [facility] Suicide Risk Assessment (Signed)
[facility] INPATIENT:  Family/Significant Other Suicide Prevention Education  Suicide Prevention Education:  Patient Refusal for Family/Significant Other Suicide Prevention Education: The patient [name] has refused to provide written consent for family/significant other to be provided Family/Significant Other Suicide Prevention Education during admission and/or prior to discharge.  Physician notified.  Lorri Frederick, LCSW 05/26/2018, 2:24 PM

## 2018-05-26 NOTE — Progress Notes (Signed)
[facility] NOVEL CORONAVIRUS (COVID-19) DAILY CHECK-OFF SYMPTOMS - answer yes or no to each - every day NO YES  Have you had a fever in the past 24 hours?  . Fever (Temp > 37.80C / 100F) X   Have you had any of these symptoms in the past 24 hours? . New Cough .  Sore Throat  .  Shortness of Breath .  Difficulty Breathing .  Unexplained Body Aches   X   Have you had any one of these symptoms in the past 24 hours not related to allergies?   . Runny Nose .  Nasal Congestion .  Sneezing   X   If you have had runny nose, nasal congestion, sneezing in the past 24 hours, has it worsened?  X   EXPOSURES - check yes or no X   Have you traveled outside the state in the past 14 days?  X   Have you been in contact with someone with a confirmed diagnosis of COVID-19 or PUI in the past 14 days without wearing appropriate PPE?  X   Have you been living in the same home as a person with confirmed diagnosis of COVID-19 or a PUI (household contact)?    X   Have you been diagnosed with COVID-19?    X              What to do next: Answered NO to all: Answered YES to anything:   Proceed with unit schedule Follow the [facility] Inpatient Flowsheet.   

## 2018-05-26 NOTE — Progress Notes (Signed)
DAR NOTE: Patient presents with anxious affect and paranoid behaviors.  Denies suicidal thoughts, auditory and visual hallucinations.  Rates depression at 1, hopelessness at 1, and anxiety at 2.  Maintained on routine safety checks.  Refused all medications after several attempts and education.  Made multiple request and asking different staff for the same things.  Support and encouragement offered as needed.  Attended group and participated.  States goal for today is "rest and drink plenty of bottled water."  Patient pacing the hallway talking and laughing to herself.  Patient observed socializing with peers in the dayroom.  Patient is safe on the unit.

## 2018-05-27 NOTE — Progress Notes (Signed)
Nursing Progress Note: 7p-7a D: Pt currently presents with a paranoid/staring/avoidant affect and behavior. Pt states "I don't believe I need to be here. I am ok mentally. I am here for medical reasons." Interacting minimaly with the milieu. Pt reports good sleep during the previous night with current medication regimen.  A: Pt refused medications per providers orders. Pt's labs and vitals were monitored throughout the night. Pt supported emotionally and encouraged to express concerns and questions. Pt educated on medications.  R: Pt's safety ensured with 15 minute and environmental checks. Pt currently denies SI, HI, and AVH. Pt verbally contracts to seek staff if SI,HI, or AVH occurs and to consult with staff before acting on any harmful thoughts. Will continue to monitor.
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
## 2018-05-27 NOTE — [facility] Group Notes (Signed)
  [facility] LCSW Group Therapy Note  Date/Time: 05/27/18, 1310  Type of Therapy/Topic:  Group Therapy:  Emotion Regulation  Participation Level:  Did Not Attend   Mood:  Description of Group:    The purpose of this group is to assist patients in learning to regulate negative emotions and experience positive emotions. Patients will be guided to discuss ways in which they have been vulnerable to their negative emotions. These vulnerabilities will be juxtaposed with experiences of positive emotions or situations, and patients challenged to use positive emotions to combat negative ones. Special emphasis will be placed on coping with negative emotions in conflict situations, and patients will process healthy conflict resolution skills.  Therapeutic Goals: 1. Patient will identify two positive emotions or experiences to reflect on in order to balance out negative emotions:  2. Patient will label two or more emotions that they find the most difficult to experience:  3. Patient will be able to demonstrate positive conflict resolution skills through discussion or role plays:   Summary of Patient Progress:       Therapeutic Modalities:   Cognitive Behavioral Therapy Feelings Identification Dialectical Behavioral Therapy  Greg Trestin Vences, LCSW 

## 2018-05-27 NOTE — Progress Notes (Signed)
Recreation Therapy Notes  Date: 5.1.20 Time: 1000 Location: 500 Hall Dayroom  Group Topic: Communication  Goal Area(s) Addresses:  Patient will effectively communicate with peers in group.  Patient will verbalize benefit of healthy communication. Patient will verbalize positive effect of healthy communication on post d/c goals.  Patient will identify communication techniques that made activity effective for group.   Intervention:  Plain paper, pencils, drawings   Activity: Geometrical Drawings.  Patients took turns describing a geometrical drawing to the group.  The speaker could describe the picture in as much detail as needed.  The patients who were drawing could only ask the speaker to repeat themselves.  They could not ask any detailed questions.  At the completion of each drawing, patients would compare their drawing to the original picture.  Education: Communication, Discharge Planning  Education Outcome: Acknowledges understanding/In group clarification offered/Needs additional education.   Clinical Observations/Feedback: Pt did not attend group.    Kiing Deakin, LRT/CTRS         Soham Hollett A 05/27/2018 12:47 PM 

## 2018-05-27 NOTE — Plan of Care (Signed)
Progress note  D: pt found in bed; non compliant with medication administration. Pt denies any physical symptoms or pain, but is obviously paranoid, delusional, and not open to education. Pt still denies she needs to be here and that she needs medication. Pt denies si/hi/ah/vh and verbally agrees to approach staff if these become apparent or before harming herself/others while at [facility].  A: pt provided support and encouragement. Pt given education on her medication. Q27m safety checks implemented and continued.  R: pt safe on the unit. Will continue to monitor.   Pt progressing in the following metrics  Problem: Education: Goal: Knowledge of  General Education information/materials will improve Outcome: Progressing Goal: Verbalization of understanding the information provided will improve Outcome: Progressing   Problem: Activity: Goal: Sleeping patterns will improve Outcome: Progressing   Problem: Coping: Goal: Ability to verbalize frustrations and anger appropriately will improve Outcome: Progressing

## 2018-05-27 NOTE — Tx Team (Addendum)
Interdisciplinary Treatment and Diagnostic Plan Update  05/27/2018 Time of Session: 0856 [name] MRN: [id number]  Principal Diagnosis: <principal problem not specified>  Secondary Diagnoses: Active Problems:   Bipolar 1 disorder (HCC)   Current Medications:  Current Facility-Administered Medications  Medication Dose Route Frequency Provider Last Rate Last Dose  . acetaminophen (TYLENOL) tablet 650 mg  650 mg Oral Q6H PRN Denzil Jimenez, Lashunda, NP      . alum & mag hydroxide-simeth (MAALOX/MYLANTA) 200-200-20 MG/5ML suspension 30 mL  30 mL Oral Q4H PRN Denzil Jimenez, Lashunda, NP      . ARIPiprazole ER (ABILIFY MAINTENA) injection 400 mg  400 mg Intramuscular Q28 days Malvin Jimenez, Brian, MD      . benztropine (COGENTIN) tablet 0.5 mg  0.5 mg Oral BID Malvin Jimenez, Brian, MD      . hydrOXYzine (ATARAX/VISTARIL) tablet 50 mg  50 mg Oral Q6H PRN Donell SievertSimon, Janet E, PA-C   50 mg at 05/27/18 0024  . magnesium hydroxide (MILK OF MAGNESIA) suspension 30 mL  30 mL Oral Daily PRN Denzil Jimenez, Lashunda, NP   30 mL at 05/25/18 1832  . pneumococcal 23 valent vaccine (PNU-IMMUNE) injection 0.5 mL  0.5 mL Intramuscular Tomorrow-1000 Malvin Jimenez, Brian, MD      . QUEtiapine (SEROQUEL) tablet 200 mg  200 mg Oral QHS Malvin Jimenez, Brian, MD      . risperiDONE (RISPERDAL) tablet 3 mg  3 mg Oral BID Malvin Jimenez, Brian, MD      . traZODone (DESYREL) tablet 100 mg  100 mg Oral QHS PRN,MR X 1 Kerry HoughSimon, Janet E, PA-C   100 mg at 05/25/18 2118   PTA Medications: Medications Prior to Admission  Medication Sig Dispense Refill Last Dose  . amphetamine-dextroamphetamine (ADDERALL) 10 MG tablet Take 1 tablet (10 mg total) by mouth daily after lunch. 30 tablet 0   . amphetamine-dextroamphetamine (ADDERALL) 20 MG tablet Take 1 tablet (20 mg total) by mouth daily before breakfast. 30 tablet 0   . clonazePAM (KLONOPIN) 0.5 MG tablet TAKE 1 TO 2 TABLETS BY MOUTH DAILY AS NEEDED FOR ANXIETY 60 tablet 2   . cyclobenzaprine (FLEXERIL) 5 MG tablet Take 1 tablet (5 mg  total) by mouth 3 (three) times daily as needed for muscle spasms. 30 tablet 0   . escitalopram (LEXAPRO) 10 MG tablet Take 10 mg by mouth daily.     Marland Kitchen. albuterol (PROVENTIL HFA;VENTOLIN HFA) 108 (90 Base) MCG/ACT inhaler Inhale 2 puffs into the lungs every 6 (six) hours as needed for wheezing or shortness of breath. (Patient not taking: Reported on 05/26/2018) 1 Inhaler 2 Not Taking at Unknown time  . ARIPiprazole (ABILIFY) 15 MG tablet Take 1 tablet (15 mg total) by mouth daily. For mood control (Patient not taking: Reported on 05/26/2018) 30 tablet 0 Not Taking at Unknown time  . azithromycin (ZITHROMAX Z-PAK) 250 MG tablet 2 tab by mouth day 1, then 1 per day (Patient not taking: Reported on 05/26/2018) 6 tablet 1 Completed Course at Unknown time  . hydrOXYzine (ATARAX/VISTARIL) 50 MG tablet Take 1 tablet (50 mg total) by mouth every 6 (six) hours as needed for anxiety. (Patient not taking: Reported on 05/26/2018) 60 tablet 0 Not Taking at Unknown time  . ibuprofen (ADVIL,MOTRIN) 800 MG tablet Take 1 tablet (800 mg total) by mouth every 8 (eight) hours as needed. (Patient not taking: Reported on 05/26/2018) 10 tablet 0 Completed Course at Unknown time  . meloxicam (MOBIC) 15 MG tablet Take 1 tablet (15 mg total) by mouth daily. For arthritis  pain (Patient not taking: Reported on 05/26/2018) 30 tablet 3 Completed Course at Unknown time  . nicotine polacrilex (NICORETTE) 2 MG gum Take 1 each (2 mg total) by mouth as needed for smoking cessation. (May buy from over the counter): GFor smoking cessation (Patient not taking: Reported on 05/26/2018) 100 tablet 0 Not Taking at Unknown time  . traZODone (DESYREL) 50 MG tablet Take 1 tablet (50 mg total) by mouth at bedtime as needed for sleep. (Patient not taking: Reported on 05/26/2018) 30 tablet 0 Not Taking at Unknown time    Patient Stressors: Medication change or noncompliance  Patient Strengths: Ability for insight Average or above average  intelligence  Treatment Modalities: Medication Management, Group therapy, Case management,  1 to 1 session with clinician, Psychoeducation, Recreational therapy.   Physician Treatment Plan for Primary Diagnosis: <principal problem not specified> Long Term Goal(s): Improvement in symptoms so as ready for discharge Improvement in symptoms so as ready for discharge   Short Term Goals: Compliance with prescribed medications will improve Ability to identify and develop effective coping behaviors will improve  Medication Management: Evaluate patient's response, side effects, and tolerance of medication regimen.  Therapeutic Interventions: 1 to 1 sessions, Unit Group sessions and Medication administration.  Evaluation of Outcomes: Progressing  Physician Treatment Plan for Secondary Diagnosis: Active Problems:   Bipolar 1 disorder (HCC)  Long Term Goal(s): Improvement in symptoms so as ready for discharge Improvement in symptoms so as ready for discharge   Short Term Goals: Compliance with prescribed medications will improve Ability to identify and develop effective coping behaviors will improve     Medication Management: Evaluate patient's response, side effects, and tolerance of medication regimen.  Therapeutic Interventions: 1 to 1 sessions, Unit Group sessions and Medication administration.  Evaluation of Outcomes: Progressing   RN Treatment Plan for Primary Diagnosis: <principal problem not specified> Long Term Goal(s): Knowledge of disease and therapeutic regimen to maintain health will improve  Short Term Goals: Ability to participate in decision making will improve, Ability to verbalize feelings will improve, Ability to disclose and discuss suicidal ideas, Ability to identify and develop effective coping behaviors will improve and Compliance with prescribed medications will improve  Medication Management: RN will administer medications as ordered by provider, will assess and  evaluate patient's response and provide education to patient for prescribed medication. RN will report any adverse and/or side effects to prescribing provider.  Therapeutic Interventions: 1 on 1 counseling sessions, Psychoeducation, Medication administration, Evaluate responses to treatment, Monitor vital signs and CBGs as ordered, Perform/monitor CIWA, COWS, AIMS and Fall Risk screenings as ordered, Perform wound care treatments as ordered.  Evaluation of Outcomes: Progressing   LCSW Treatment Plan for Primary Diagnosis: <principal problem not specified> Long Term Goal(s): Safe transition to appropriate next level of care at discharge, Engage patient in therapeutic group addressing interpersonal concerns.  Short Term Goals: Engage patient in aftercare planning with referrals and resources and Increase skills for wellness and recovery  Therapeutic Interventions: Assess for all discharge needs, 1 to 1 time with Social worker, Explore available resources and support systems, Assess for adequacy in community support network, Educate family and significant other(s) on suicide prevention, Complete Psychosocial Assessment, Interpersonal group therapy.  Evaluation of Outcomes: Progressing   Progress in Treatment: Attending groups: Yes. Participating in groups: No. Taking medication as prescribed: No. Toleration medication: No. Family/Significant other contact made: No, will contact:  patient denied Patient understands diagnosis: Yes. Discussing patient identified problems/goals with staff: Yes. Medical problems stabilized or  resolved: Yes. Denies suicidal/homicidal ideation: Yes. Issues/concerns per patient self-inventory: No. Other:   New problem(s) identified: No, Describe:  None  New Short Term/Long Term Goal(s):  Patient Goals:  "Make sure my mental and physical health is taken care of"  Discharge Plan or Barriers:   Reason for Continuation of Hospitalization:  Aggression Hallucinations Medication stabilization  Estimated Length of Stay: 2-3 days  Attendees: Patient: [name] 05/27/2018   Physician: Malvin Johns, MD 05/27/2018   Nursing: Casimiro Needle, RN 05/27/2018   RN Care Manager: 05/27/2018   Social Worker: Garner Nash, LCSW 05/27/2018   Recreational Therapist:  05/27/2018   Other: Stephannie Peters, LCSW 05/27/2018   Other:  05/27/2018   Other: 05/27/2018       Scribe for Treatment Team: Delphia Grates, LCSW 05/27/2018 9:51 AM

## 2018-05-27 NOTE — Progress Notes (Signed)
[name] NOVEL CORONAVIRUS (COVID-19) DAILY CHECK-OFF SYMPTOMS - answer yes or no to each - every day NO YES  Have you had a fever in the past 24 hours?  . Fever (Temp > 37.80C / 100F) X   Have you had any of these symptoms in the past 24 hours? . New Cough .  Sore Throat  .  Shortness of Breath .  Difficulty Breathing .  Unexplained Body Aches   X   Have you had any one of these symptoms in the past 24 hours not related to allergies?   . Runny Nose .  Nasal Congestion .  Sneezing   X   If you have had runny nose, nasal congestion, sneezing in the past 24 hours, has it worsened?  X   EXPOSURES - check yes or no X   Have you traveled outside the state in the past 14 days?  X   Have you been in contact with someone with a confirmed diagnosis of COVID-19 or PUI in the past 14 days without wearing appropriate PPE?  X   Have you been living in the same home as a person with confirmed diagnosis of COVID-19 or a PUI (household contact)?    X   Have you been diagnosed with COVID-19?    X              What to do next: Answered NO to all: Answered YES to anything:   Proceed with unit schedule Follow the [facility] Inpatient Flowsheet.   

## 2018-05-27 NOTE — Progress Notes (Signed)
[facility] MD Progress Note  05/27/2018 11:35 AM [name]  MRN:  [id number] Subjective:    Patient refusing to talk at length still very paranoid and not compliant with meds again we made very little progress but of course things are early in her stay.  Encouraged towards compliance.  Reality based therapy continues Principal Problem: Intense paranoia Diagnosis: Active Problems:   Bipolar 1 disorder (HCC)  Total Time spent with patient: 20 minutes  Past Medical History:  Past Medical History:  Diagnosis Date  . ADD 10/22/2009  . ALLERGIC RHINITIS 09/26/2008  . Anemia   . ANXIETY 09/26/2008  . Asthma   . Complication of anesthesia    "tempermental" per patient  . CONSTIPATION 09/26/2008  . DEPRESSION 09/26/2008  . FATIGUE 01/14/2009  . Headache   . HYPERLIPIDEMIA 09/26/2008  . OTITIS MEDIA, LEFT 01/14/2009  . SKIN LESION 09/26/2008  . Sleep apnea   . TONSILLITIS, ACUTE 01/14/2009  . Unconscious state (HCC) 10/13/2017  . URI 10/22/2009    Past Surgical History:  Procedure Laterality Date  . CERVICAL POLYPECTOMY  2009  . DILATATION & CURETTAGE/HYSTEROSCOPY WITH MYOSURE N/A 11/17/2017   Procedure: DILATATION & CURETTAGE/HYSTEROSCOPY WITH MYOSURE;  Surgeon: Geryl Rankins, MD;  Location: [facility];  Service: Gynecology;  Laterality: N/A;  . extraction of wisdom teeth     error in charting  . polyp removal      Family History:  Family History  Problem Relation Age of Onset  . Diabetes Other   . Hypertension Other   . Thyroid disease Mother   . Hypertension Mother    Family Psychiatric  History: ext Social History:  Social History   Substance and Sexual Activity  Alcohol Use Not Currently     Social History   Substance and Sexual Activity  Drug Use No    Social History   Socioeconomic History  . Marital status: Single    Spouse name: Not on file  . Number of children: Not on file  . Years of education: Not on file  . Highest education level: Not on file  Occupational  History  . 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 Financial resource strain: Not on file  . Food insecurity:    Worry: Not on file    Inability: Not on file  . Transportation needs:    Medical: Not on file    Non-medical: Not on file  Tobacco Use  . Smoking status: Current Every Day Smoker    Packs/day: 0.50    Years: 20.00    Pack years: 10.00    Types: Cigarettes  . Smokeless tobacco: Never Used  Substance and Sexual Activity  . Alcohol use: Not Currently  . Drug use: No  . Sexual activity: Not Currently    Birth control/protection: Abstinence  Lifestyle  . Physical activity:    Days per week: Not on file    Minutes per session: Not on file  . Stress: Not on file  Relationships  . Social connections:    Talks on phone: Not on file    Gets together: Not on file    Attends religious service: Not on file    Active member of club or organization: Not on file    Attends meetings of clubs or organizations: Not on file    Relationship status: Not on file  Other Topics Concern  . Not on file  Social History Narrative   Lives with mother   Additional Social History:  Longest period of sobriety (when/how long): six months  Negative Consequences of Use: Personal relationships                    Sleep: Fair  Appetite:  Fair  Current Medications: Current Facility-Administered Medications  Medication Dose Route Frequency Provider Last Rate Last Dose  . acetaminophen (TYLENOL) tablet 650 mg  650 mg Oral Q6H PRN Denzil Magnuson, NP      . alum & mag hydroxide-simeth (MAALOX/MYLANTA) 200-200-20 MG/5ML suspension 30 mL  30 mL Oral Q4H PRN Denzil Magnuson, NP      . ARIPiprazole ER (ABILIFY MAINTENA) injection 400 mg  400 mg Intramuscular Q28 days Malvin Johns, MD      . benztropine (COGENTIN) tablet 0.5 mg  0.5 mg Oral BID Malvin Johns, MD      . hydrOXYzine (ATARAX/VISTARIL) tablet 50 mg  50 mg Oral Q6H PRN Donell Sievert E, PA-C   50 mg at 05/27/18 0024   . magnesium hydroxide (MILK OF MAGNESIA) suspension 30 mL  30 mL Oral Daily PRN Denzil Magnuson, NP   30 mL at 05/25/18 1832  . pneumococcal 23 valent vaccine (PNU-IMMUNE) injection 0.5 mL  0.5 mL Intramuscular Tomorrow-1000 Malvin Johns, MD      . QUEtiapine (SEROQUEL) tablet 200 mg  200 mg Oral QHS Malvin Johns, MD      . risperiDONE (RISPERDAL) tablet 3 mg  3 mg Oral BID Malvin Johns, MD      . traZODone (DESYREL) tablet 100 mg  100 mg Oral QHS PRN,MR X 1 Kerry Hough, PA-C   100 mg at 05/25/18 2118    Lab Results:  Results for orders placed or performed during the hospital encounter of 05/25/18 (from the past 48 hour(s))  Hemoglobin A1c     Status: None   Collection Time: 05/26/18  7:31 AM  Result Value Ref Range   Hgb A1c MFr Bld 5.1 4.8 - 5.6 %    Comment: (NOTE) Pre diabetes:          5.7%-6.4% Diabetes:              >6.4% Glycemic control for   <7.0% adults with diabetes    Mean Plasma Glucose 99.67 mg/dL    Comment: Performed at [facility], [address], [place], [place] [address]  Lipid panel     Status: None   Collection Time: 05/26/18  7:31 AM  Result Value Ref Range   Cholesterol 138 0 - 200 mg/dL   Triglycerides 90 <017 mg/dL   HDL 49 >49 mg/dL   Total CHOL/HDL Ratio 2.8 RATIO   VLDL 18 0 - 40 mg/dL   LDL Cholesterol 71 0 - 99 mg/dL    Comment:        Total Cholesterol/HDL:CHD Risk Coronary Heart Disease Risk Table                     Men   Women  1/2 Average Risk   3.4   3.3  Average Risk       5.0   4.4  2 X Average Risk   9.6   7.1  3 X Average Risk  23.4   11.0        Use the calculated Patient Ratio above and the CHD Risk Table to determine the patient's CHD Risk.        ATP III CLASSIFICATION (LDL):  <100     mg/dL   Optimal  100-129  mg/dL   Near or Above                    Optimal  130-159  mg/dL   [id number]  mg/dL   High  >045     mg/dL   Very High Performed at [facility], [address]  [place], [place] [address]   TSH     Status: None   Collection Time: 05/26/18  7:31 AM  Result Value Ref Range   TSH 2.555 0.350 - 4.500 uIU/mL    Comment: Performed by a 3rd Generation assay with a functional sensitivity of <=0.01 uIU/mL. Performed at [facility], [address] [place], [place] [address]     Blood Alcohol level:  Lab Results  Component Value Date   ETH <10 05/24/2018   ETH <10 10/13/2017    Metabolic Disorder Labs: Lab Results  Component Value Date   HGBA1C 5.1 05/26/2018   MPG 99.67 05/26/2018   No results found for: PROLACTIN Lab Results  Component Value Date   CHOL 138 05/26/2018   TRIG 90 05/26/2018   HDL 49 05/26/2018   CHOLHDL 2.8 05/26/2018   VLDL 18 05/26/2018   LDLCALC 71 05/26/2018   LDLCALC 104 (H) 03/11/2017    Physical Findings: AIMS: Facial and Oral Movements Muscles of Facial Expression: None, normal Lips and Perioral Area: None, normal Jaw: None, normal Tongue: None, normal,Extremity Movements Upper (arms, wrists, hands, fingers): None, normal Lower (legs, knees, ankles, toes): None, normal, Trunk Movements Neck, shoulders, hips: None, normal, Overall Severity Severity of abnormal movements (highest score from questions above): None, normal Incapacitation due to abnormal movements: None, normal Patient's awareness of abnormal movements (rate only patient's report): No Awareness, Dental Status Current problems with teeth and/or dentures?: No Does patient usually wear dentures?: No  CIWA:  CIWA-Ar Total: 0 COWS:  COWS Total Score: 0  Musculoskeletal: Strength & Muscle Tone: within normal limits Gait & Station: normal Patient leans: N/A  Psychiatric Specialty Exam: Physical Exam  ROS  Blood pressure (!) 105/58, pulse 96, temperature 98 F (36.7 C), resp. rate 16, last menstrual period 04/24/2018.There is no height or weight on file to calculate BMI.  General Appearance: Casual  Eye Contact:  Fair  Speech:   Normal Rate  Volume:  Decreased  Mood:  Dysphoric and Irritable  Affect:  Appropriate and Congruent  Thought Process:  Irrelevant and Descriptions of Associations: Loose  Orientation:  Full (Time, Place, and Person)  Thought Content:  Paranoid Ideation, Rumination and Tangential  Suicidal Thoughts:  No  Homicidal Thoughts:  No  Memory:  Immediate;   Fair  Judgement:  Impaired  Insight:  Lacking  Psychomotor Activity:  Normal  Concentration:  Concentration: Fair  Recall:  Fair  Fund of Knowledge:  Poor  Language:  Fair  Akathisia:  Negative  Handed:  Right  AIMS (if indicated):     Assets:  Resilience Social Support  ADL's:  Intact  Cognition:  WNL  Sleep:  Number of Hours: 3.75     Treatment Plan Summary: Daily contact with patient to assess and evaluate symptoms and progress in treatment, Medication management and Plan Patient needs antipsychotic therapy but is refusing continue to monitor may need forced meds at some point continue reality-based therapy hopefully she will turn the corner and began compliant with meds  Hanaan Gancarz, MD 05/27/2018, 11:35 AM

## 2018-05-28 DIAGNOSIS — F29 Unspecified psychosis not due to a substance or known physiological condition: Secondary | ICD-10-CM

## 2018-05-28 MED ORDER — QUETIAPINE FUMARATE 100 MG PO TABS
100.0000 mg | ORAL_TABLET | Freq: Every day | ORAL | Status: DC
  Administered 2018-05-28 – 2018-05-30 (×3): 100 mg via ORAL
  Filled 2018-05-28 (×5): qty 1

## 2018-05-28 MED ORDER — RISPERIDONE 2 MG PO TABS
2.0000 mg | ORAL_TABLET | Freq: Two times a day (BID) | ORAL | Status: DC
  Administered 2018-05-28 – 2018-05-30 (×4): 2 mg via ORAL
  Filled 2018-05-28 (×6): qty 1

## 2018-05-28 NOTE — Progress Notes (Signed)
The focus of this group is to help patients establish daily goals to achieve during treatment and discuss how the patient can incorporate goal setting into their daily lives to aide in recovery. 

## 2018-05-28 NOTE — Progress Notes (Signed)
[place] NOVEL CORONAVIRUS (COVID-19) DAILY CHECK-OFF SYMPTOMS - answer yes or no to each - every day NO YES  Have you had a fever in the past 24 hours?  . Fever (Temp > 37.80C / 100F) X   Have you had any of these symptoms in the past 24 hours? . New Cough .  Sore Throat  .  Shortness of Breath .  Difficulty Breathing .  Unexplained Body Aches   X   Have you had any one of these symptoms in the past 24 hours not related to allergies?   . Runny Nose .  Nasal Congestion .  Sneezing   X   If you have had runny nose, nasal congestion, sneezing in the past 24 hours, has it worsened?  X   EXPOSURES - check yes or no X   Have you traveled outside the state in the past 14 days?  X   Have you been in contact with someone with a confirmed diagnosis of COVID-19 or PUI in the past 14 days without wearing appropriate PPE?  X   Have you been living in the same home as a person with confirmed diagnosis of COVID-19 or a PUI (household contact)?    X   Have you been diagnosed with COVID-19?    X              What to do next: Answered NO to all: Answered YES to anything:   Proceed with unit schedule Follow the [facility] Inpatient Flowsheet.   

## 2018-05-28 NOTE — Progress Notes (Addendum)
Pt presented to the nurses station requesting to go to the ED because she doesn't feel well. Pt reported feeling fatigue. Pt then began crying. Pt v/s assessed and stable. Writer notified Dr. Jama Flavors who agreed to assessing the pt's status.    Follow up note: pt assessed by writer and Dr. Jama Flavors. Pt expressed having some depression along with anxiety, palpitations and possible panic attack. Pt c/o ongoing nasal congestion and SOB. Pt asked the MD if it's possible she may have the coronavirus. Pt then requested medication for anxiety. Pt v/s assessed and WNL.

## 2018-05-28 NOTE — Progress Notes (Addendum)
[facility] MD Progress Note  05/28/2018 11:13 AM [name]  MRN:  [id number]   Subjective:  [name] reports " I want to go home I miss my friends and family."  Evaluation: [name] presents tearful, flat and guarded.  She reports coming in voluntarily due to " dizziness and passing out."  Patient reports unsure if her symptoms is related to pregnancy or diabetes however she wanted to be evaluated.  She reports previous inpatient admission and stated she was feeling better after discharge.  States feeling dizziness after medication.  Staff reports patient needs constant encouragement to take medications as prescribed.  Discussed decreasing doses patient was agreeable to plan however continue to use to state she does not need medication.  She denies suicidal or homicidal ideations.  Does states she " feels depressed all the time and I know that something wrong with me but I do not know what."  Patient appears paranoid with staff and peers.  Patient is isolative to room.  Discussed titration of medications.  Orders placed for urine pregnancy.  Support, encouragement and reassurance was provided  History: Per admission assessment note: this is a repeat admission for this 38 year old patient last here in September 2019.  He is known to have a diagnosis of bipolar disorder with a history of psychosis.  She is quite paranoid on this interview in fact will not speak to me in the office will not speak to me in the room even with an escort, states he must speak in the hallway and then halfway through the interview states she will not talk further when on an attorney present.She had required petition back in 2019 as well and at that point was quite paranoid believes she is being followed, poison, spied on and so forth had flight of ideas insomnia and was treated with a combination of Lexapro 20 mg and Abilify 15 mg a day.    Principal Problem: Intense paranoia Diagnosis: Active Problems:   Bipolar 1 disorder  (HCC)  Total Time spent with patient: 20 minutes  Past Medical History:  Past Medical History:  Diagnosis Date  . ADD 10/22/2009  . ALLERGIC RHINITIS 09/26/2008  . Anemia   . ANXIETY 09/26/2008  . Asthma   . Complication of anesthesia    "tempermental" per patient  . CONSTIPATION 09/26/2008  . DEPRESSION 09/26/2008  . FATIGUE 01/14/2009  . Headache   . HYPERLIPIDEMIA 09/26/2008  . OTITIS MEDIA, LEFT 01/14/2009  . SKIN LESION 09/26/2008  . Sleep apnea   . TONSILLITIS, ACUTE 01/14/2009  . Unconscious state (HCC) 10/13/2017  . URI 10/22/2009    Past Surgical History:  Procedure Laterality Date  . CERVICAL POLYPECTOMY  2009  . DILATATION & CURETTAGE/HYSTEROSCOPY WITH MYOSURE N/A 11/17/2017   Procedure: DILATATION & CURETTAGE/HYSTEROSCOPY WITH MYOSURE;  Surgeon: Geryl Rankins, MD;  Location: [facility];  Service: Gynecology;  Laterality: N/A;  . extraction of wisdom teeth     error in charting  . polyp removal      Family History:  Family History  Problem Relation Age of Onset  . Diabetes Other   . Hypertension Other   . Thyroid disease Mother   . Hypertension Mother    Family Psychiatric  History: ext Social History:  Social History   Substance and Sexual Activity  Alcohol Use Not Currently     Social History   Substance and Sexual Activity  Drug Use No    Social History   Socioeconomic History  . Marital status: Single  Spouse name: Not on file  . Number of children: Not on file  . Years of education: Not on file  . Highest education level: Not on file  Occupational History  . 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 Financial resource strain: Not on file  . Food insecurity:    Worry: Not on file    Inability: Not on file  . Transportation needs:    Medical: Not on file    Non-medical: Not on file  Tobacco Use  . Smoking status: Current Every Day Smoker    Packs/day: 0.50    Years: 20.00    Pack years: 10.00    Types: Cigarettes  . Smokeless  tobacco: Never Used  Substance and Sexual Activity  . Alcohol use: Not Currently  . Drug use: No  . Sexual activity: Not Currently    Birth control/protection: Abstinence  Lifestyle  . Physical activity:    Days per week: Not on file    Minutes per session: Not on file  . Stress: Not on file  Relationships  . Social connections:    Talks on phone: Not on file    Gets together: Not on file    Attends religious service: Not on file    Active member of club or organization: Not on file    Attends meetings of clubs or organizations: Not on file    Relationship status: Not on file  Other Topics Concern  . Not on file  Social History Narrative   Lives with mother   Additional Social History:    Longest period of sobriety (when/how long): six months  Negative Consequences of Use: Personal relationships                    Sleep: Fair  Appetite:  Fair  Current Medications: Current Facility-Administered Medications  Medication Dose Route Frequency Provider Last Rate Last Dose  . acetaminophen (TYLENOL) tablet 650 mg  650 mg Oral Q6H PRN Denzil Magnusonhomas, Lashunda, NP      . alum & mag hydroxide-simeth (MAALOX/MYLANTA) 200-200-20 MG/5ML suspension 30 mL  30 mL Oral Q4H PRN Denzil Magnusonhomas, Lashunda, NP      . ARIPiprazole ER (ABILIFY MAINTENA) injection 400 mg  400 mg Intramuscular Q28 days Malvin JohnsFarah, Brian, MD      . benztropine (COGENTIN) tablet 0.5 mg  0.5 mg Oral BID Malvin JohnsFarah, Brian, MD   0.5 mg at 05/28/18 0756  . hydrOXYzine (ATARAX/VISTARIL) tablet 50 mg  50 mg Oral Q6H PRN Donell SievertSimon, Spencer E, PA-C   50 mg at 05/27/18 0024  . magnesium hydroxide (MILK OF MAGNESIA) suspension 30 mL  30 mL Oral Daily PRN Denzil Magnusonhomas, Lashunda, NP   30 mL at 05/28/18 0402  . pneumococcal 23 valent vaccine (PNU-IMMUNE) injection 0.5 mL  0.5 mL Intramuscular Tomorrow-1000 Malvin JohnsFarah, Brian, MD      . QUEtiapine (SEROQUEL) tablet 100 mg  100 mg Oral QHS Oneta RackLewis, Tanika N, NP      . risperiDONE (RISPERDAL) tablet 2 mg  2 mg Oral  BID Oneta RackLewis, Tanika N, NP      . traZODone (DESYREL) tablet 100 mg  100 mg Oral QHS PRN,MR X 1 Kerry HoughSimon, Spencer E, PA-C   100 mg at 05/25/18 2118    Lab Results:  No results found for this or any previous visit (from the past 48 hour(s)).  Blood Alcohol level:  Lab Results  Component Value Date   ETH <10 05/24/2018   ETH <10 10/13/2017  Metabolic Disorder Labs: Lab Results  Component Value Date   HGBA1C 5.1 05/26/2018   MPG 99.67 05/26/2018   No results found for: PROLACTIN Lab Results  Component Value Date   CHOL 138 05/26/2018   TRIG 90 05/26/2018   HDL 49 05/26/2018   CHOLHDL 2.8 05/26/2018   VLDL 18 05/26/2018   LDLCALC 71 05/26/2018   LDLCALC 104 (H) 03/11/2017    Physical Findings: AIMS: Facial and Oral Movements Muscles of Facial Expression: None, normal Lips and Perioral Area: None, normal Jaw: None, normal Tongue: None, normal,Extremity Movements Upper (arms, wrists, hands, fingers): None, normal Lower (legs, knees, ankles, toes): None, normal, Trunk Movements Neck, shoulders, hips: None, normal, Overall Severity Severity of abnormal movements (highest score from questions above): None, normal Incapacitation due to abnormal movements: None, normal Patient's awareness of abnormal movements (rate only patient's report): No Awareness, Dental Status Current problems with teeth and/or dentures?: No Does patient usually wear dentures?: No  CIWA:  CIWA-Ar Total: 0 COWS:  COWS Total Score: 0  Musculoskeletal: Strength & Muscle Tone: within normal limits Gait & Station: normal Patient leans: N/A  Psychiatric Specialty Exam: Physical Exam  Vitals reviewed. Constitutional: She appears well-developed.    ROS  Blood pressure 111/65, pulse 86, temperature 97.6 F (36.4 C), temperature source Oral, resp. rate 20, last menstrual period 04/24/2018.There is no height or weight on file to calculate BMI.  General Appearance: Casual  Eye Contact:  Fair  Speech:   Normal Rate  Volume:  Decreased  Mood:  Dysphoric and Irritable  Affect:  Appropriate and Congruent  Thought Process:  Irrelevant and Descriptions of Associations: Loose  Orientation:  Full (Time, Place, and Person)  Thought Content:  Paranoid Ideation, Rumination and Tangential  Suicidal Thoughts:  No  Homicidal Thoughts:  No  Memory:  Immediate;   Fair  Judgement:  Impaired  Insight:  Lacking  Psychomotor Activity:  Normal  Concentration:  Concentration: Fair  Recall:  Fair  Fund of Knowledge:  Poor  Language:  Fair  Akathisia:  Negative  Handed:  Right  AIMS (if indicated):     Assets:  Resilience Social Support  ADL's:  Intact  Cognition:  WNL  Sleep:  Number of Hours: 0.5     Treatment Plan Summary: Daily contact with patient to assess and evaluate symptoms and progress in treatment and Medication management   Bipolar with psychosis  Continue current treatment plan on 05/28/2018 as listed below except for noted  Decreased Seroquel 200 mg p.o. nightly to 100 mg p.o. nightly Decrease Risperdal 3 mg to 2 mg p.o. twice daily Continue Cogentin 0.5 mg p.o. nightly  Abilify maintain a received on 4/30 repeat 28 days Continue trazodone 100 mg p.o. nightly as needed   CSW to continue working on discharge disposition Patient encouraged to participate throughout the therapeutic milieu  Oneta Rack, NP 05/28/2018, 11:13 AM Attest to NP Progress Note

## 2018-05-28 NOTE — [facility] Group Notes (Signed)
  [facility] LCSW Group Therapy Note  Date/Time:  05/28/2018 11:15AM-12:00PM  Type of Therapy and Topic:  Group Therapy:  Feelings About Hospitalization  Participation Level:  None   Description of Group This process group involved patients discussing their feelings related to being hospitalized, as well as the benefits they see to being in the hospital.  These feelings and benefits were itemized.  The group then brainstormed specific ways in which they could seek those same benefits when they discharge and return home.  Therapeutic Goals 1. Patient will identify and describe positive and negative feelings related to hospitalization 2. Patient will verbalize benefits of hospitalization to themselves personally 3. Patients will brainstorm together ways they can obtain similar benefits in the outpatient setting, identify barriers to wellness and possible solutions  Summary of Patient Progress:  The patient expressed her primary feelings about being hospitalized are "depends on the day.  Today is a down day."  If not in the hospital she would be working.  She wandered in and out of the room several times and was unengaged in group.  Therapeutic Modalities Cognitive Behavioral Therapy Motivational Interviewing    Ambrose Mantle, LCSW 05/28/2018, 9:15 AM

## 2018-05-28 NOTE — Progress Notes (Signed)
Pt presents with an animated affect and anxious mood. Pt appears paranoid, delusional and preoccupied. Pt expressed that she haven't seen a doctor since arriving to the hospital and refuses to take any medications until she speaks with a provider. Writer explained to the pt that she meets with a provider each day and informed that pt that she met with Dr. Jake Samples yesterday. Writer encouraged the pt to take her scheduled medications and after several attempts, the pt agreed to taking medications. Pt expressed having busy thoughts when asked if she was experiencing racing thoughts. Pt denies AVH. Pt denies SI/HI.  Medications reviewed with pt. Verbal support provided. Pt encouraged to attend groups. 15 minute checks performed for safety.   Pt resistant to tx.

## 2018-05-28 NOTE — Progress Notes (Signed)
Nursing Progress Note: 7p-7a D: Pt currently presents with a depressed/anxious/suspcious/paranoid/guarded affect and behavior. Pt states "I will try to take meds tonnight." Interacting minimally/guarded with the milieu. Pt reports good sleep during the previous night with current medication regimen.   A: Pt provided with medications per providers orders. Pt's labs and vitals were monitored throughout the night. Pt supported emotionally and encouraged to express concerns and questions. Pt educated on medications.  R: Pt's safety ensured with 15 minute and environmental checks. Pt currently denies SI, HI, and AVH. Pt verbally contracts to seek staff if SI,HI, or AVH occurs and to consult with staff before acting on any harmful thoughts. Will continue to monitor.

## 2018-05-29 LAB — PREGNANCY, URINE: Preg Test, Ur: NEGATIVE

## 2018-05-29 NOTE — Progress Notes (Signed)
DAR NOTE: Patient presents with anxious affect and depressed mood. Pt has been walking back and forth, pt appeared to be responding to internal stimuli. Denies pain, auditory and visual hallucinations.  Rates depression at 2, hopelessness at 1, and anxiety at 0.  Maintained on routine safety checks.  Medications given as prescribed.  Support and encouragement offered as needed.  Attended group and participated. Will continue to monitor.

## 2018-05-29 NOTE — Progress Notes (Addendum)
D   Pt was pleasant on approach   She came to get medications without being prompted    She is guarded and isolates to her room A   Verbal support given   Medications administered and effectiveness monitored   Q 15 min checks R   Pt remains safe at this time  [place] NOVEL CORONAVIRUS (COVID-19) DAILY CHECK-OFF SYMPTOMS - answer yes or no to each - every day NO YES  Have you had a fever in the past 24 hours?  . Fever (Temp > 37.80C / 100F) X   Have you had any of these symptoms in the past 24 hours? . New Cough .  Sore Throat  .  Shortness of Breath .  Difficulty Breathing .  Unexplained Body Aches   X   Have you had any one of these symptoms in the past 24 hours not related to allergies?   . Runny Nose .  Nasal Congestion .  Sneezing   X   If you have had runny nose, nasal congestion, sneezing in the past 24 hours, has it worsened?  X   EXPOSURES - check yes or no X   Have you traveled outside the state in the past 14 days?  X   Have you been in contact with someone with a confirmed diagnosis of COVID-19 or PUI in the past 14 days without wearing appropriate PPE?  X   Have you been living in the same home as a person with confirmed diagnosis of COVID-19 or a PUI (household contact)?    X   Have you been diagnosed with COVID-19?    X              What to do next: Answered NO to all: Answered YES to anything:   Proceed with unit schedule Follow the [facility] Inpatient Flowsheet.

## 2018-05-29 NOTE — Progress Notes (Addendum)
[name] Progress Note  05/29/2018 9:44 AM [name]  MRN:  [id number]    Evaluation: [name] is awake alert and oriented x3.  Continues to deny suicidal or homicidal ideation.  Denies auditory or visual hallucinations.  Patient continues to request to be discharged as she reports she needs to get back to work.  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  She states ongoing nausea and mild headache with medication.  States she is able to tolerate it better.  Patient continues to need encouragement to take medication as she states she does not need medications.  Rates her depression 3 out of 10 with 10 being the worst.  States the medication increases her anxiety. "  My nerves are shot".  Reports a good appetite.  Urine pregnancy negative. states she is resting well throughout the night.  Support, encouragement and reassurance was provided  History: Per admission assessment note: this is a repeat admission for this 38 year old patient last here in September 2019.  He is known to have a diagnosis of bipolar disorder with a history of psychosis.  She is quite paranoid on this interview in fact will not speak to me in the office will not speak to me in the room even with an escort, states he must speak in the hallway and then halfway through the interview states she will not talk further when on an attorney present.She had required petition back in 2019 as well and at that point was quite paranoid believes she is being followed, poison, spied on and so forth had flight of ideas insomnia and was treated with a combination of Lexapro 20 mg and Abilify 15 mg a day.    Principal Problem: Intense paranoia Diagnosis: Active Problems:   Bipolar 1 disorder (HCC)  Total Time spent with patient: 20 minutes  Past Medical History:  Past Medical History:  Diagnosis Date  . ADD 10/22/2009  . ALLERGIC RHINITIS 09/26/2008  . Anemia   . ANXIETY 09/26/2008  . Asthma   . Complication of anesthesia    "tempermental"  per patient  . CONSTIPATION 09/26/2008  . DEPRESSION 09/26/2008  . FATIGUE 01/14/2009  . Headache   . HYPERLIPIDEMIA 09/26/2008  . OTITIS MEDIA, LEFT 01/14/2009  . SKIN LESION 09/26/2008  . Sleep apnea   . TONSILLITIS, ACUTE 01/14/2009  . Unconscious state (HCC) 10/13/2017  . URI 10/22/2009    Past Surgical History:  Procedure Laterality Date  . CERVICAL POLYPECTOMY  2009  . DILATATION & CURETTAGE/HYSTEROSCOPY WITH MYOSURE N/A 11/17/2017   Procedure: DILATATION & CURETTAGE/HYSTEROSCOPY WITH MYOSURE;  Surgeon: Geryl RankinsVarnado, Evelyn, Jimenez;  Location: [facility];  Service: Gynecology;  Laterality: N/A;  . extraction of wisdom teeth     error in charting  . polyp removal      Family History:  Family History  Problem Relation Age of Onset  . Diabetes Other   . Hypertension Other   . Thyroid disease Mother   . Hypertension Mother    Family Psychiatric  History: ext Social History:  Social History   Substance and Sexual Activity  Alcohol Use Not Currently     Social History   Substance and Sexual Activity  Drug Use No    Social History   Socioeconomic History  . Marital status: Single    Spouse name: Not on file  . Number of children: Not on file  . Years of education: Not on file  . Highest education level: Not on file  Occupational History  .  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 Financial resource strain: Not on file  . Food insecurity:    Worry: Not on file    Inability: Not on file  . Transportation needs:    Medical: Not on file    Non-medical: Not on file  Tobacco Use  . Smoking status: Current Every Day Smoker    Packs/day: 0.50    Years: 20.00    Pack years: 10.00    Types: Cigarettes  . Smokeless tobacco: Never Used  Substance and Sexual Activity  . Alcohol use: Not Currently  . Drug use: No  . Sexual activity: Not Currently    Birth control/protection: Abstinence  Lifestyle  . Physical activity:    Days per week: Not on file    Minutes per session:  Not on file  . Stress: Not on file  Relationships  . Social connections:    Talks on phone: Not on file    Gets together: Not on file    Attends religious service: Not on file    Active member of club or organization: Not on file    Attends meetings of clubs or organizations: Not on file    Relationship status: Not on file  Other Topics Concern  . Not on file  Social History Narrative   Lives with mother   Additional Social History:    Longest period of sobriety (when/how long): six months  Negative Consequences of Use: Personal relationships                    Sleep: Fair  Appetite:  Fair  Current Medications: Current Facility-Administered Medications  Medication Dose Route Frequency Provider Last Rate Last Dose  . acetaminophen (TYLENOL) tablet 650 mg  650 mg Oral Q6H PRN Denzil Magnuson, NP      . alum & mag hydroxide-simeth (MAALOX/MYLANTA) 200-200-20 MG/5ML suspension 30 mL  30 mL Oral Q4H PRN Denzil Magnuson, NP      . ARIPiprazole ER (ABILIFY MAINTENA) injection 400 mg  400 mg Intramuscular Q28 days Malvin Johns, Jimenez      . benztropine (COGENTIN) tablet 0.5 mg  0.5 mg Oral BID Malvin Johns, Jimenez   0.5 mg at 05/29/18 0754  . hydrOXYzine (ATARAX/VISTARIL) tablet 50 mg  50 mg Oral Q6H PRN Kerry Hough, PA-C   50 mg at 05/28/18 1906  . magnesium hydroxide (MILK OF MAGNESIA) suspension 30 mL  30 mL Oral Daily PRN Denzil Magnuson, NP   30 mL at 05/28/18 0402  . pneumococcal 23 valent vaccine (PNU-IMMUNE) injection 0.5 mL  0.5 mL Intramuscular Tomorrow-1000 Malvin Johns, Jimenez      . QUEtiapine (SEROQUEL) tablet 100 mg  100 mg Oral QHS Oneta Rack, NP   100 mg at 05/28/18 2127  . risperiDONE (RISPERDAL) tablet 2 mg  2 mg Oral BID Oneta Rack, NP   2 mg at 05/29/18 0753  . traZODone (DESYREL) tablet 100 mg  100 mg Oral QHS PRN,MR X 1 Kerry Hough, PA-C   100 mg at 05/29/18 8119    Lab Results:  Results for orders placed or performed during the hospital  encounter of 05/25/18 (from the past 48 hour(s))  Pregnancy, urine     Status: None   Collection Time: 05/28/18 12:58 PM  Result Value Ref Range   Preg Test, Ur NEGATIVE NEGATIVE    Comment:        THE SENSITIVITY OF THIS METHODOLOGY IS >20 mIU/mL. Performed at  [facility], [address], [place], [place] [address]     Blood Alcohol level:  Lab Results  Component Value Date   ETH <10 05/24/2018   ETH <10 10/13/2017    Metabolic Disorder Labs: Lab Results  Component Value Date   HGBA1C 5.1 05/26/2018   MPG 99.67 05/26/2018   No results found for: PROLACTIN Lab Results  Component Value Date   CHOL 138 05/26/2018   TRIG 90 05/26/2018   HDL 49 05/26/2018   CHOLHDL 2.8 05/26/2018   VLDL 18 05/26/2018   LDLCALC 71 05/26/2018   LDLCALC 104 (H) 03/11/2017    Physical Findings: AIMS: Facial and Oral Movements Muscles of Facial Expression: None, normal Lips and Perioral Area: None, normal Jaw: None, normal Tongue: None, normal,Extremity Movements Upper (arms, wrists, hands, fingers): None, normal Lower (legs, knees, ankles, toes): None, normal, Trunk Movements Neck, shoulders, hips: None, normal, Overall Severity Severity of abnormal movements (highest score from questions above): None, normal Incapacitation due to abnormal movements: None, normal Patient's awareness of abnormal movements (rate only patient's report): No Awareness, Dental Status Current problems with teeth and/or dentures?: No Does patient usually wear dentures?: No  CIWA:  CIWA-Ar Total: 0 COWS:  COWS Total Score: 0  Musculoskeletal: Strength & Muscle Tone: within normal limits Gait & Station: normal Patient leans: N/A  Psychiatric Specialty Exam: Physical Exam  Vitals reviewed. Constitutional: She appears well-developed.    Review of Systems  Gastrointestinal: Positive for nausea.  Neurological: Positive for headaches.  All other systems reviewed and are negative.    Blood pressure 112/62, pulse (!) 111, temperature 97.8 F (36.6 C), temperature source Oral, resp. rate 20, last menstrual period 04/24/2018, SpO2 100 %.There is no height or weight on file to calculate BMI.  General Appearance: Casual  Eye Contact:  Fair  Speech:  Normal Rate  Volume:  Decreased  Mood:  Dysphoric and Irritable  Affect:  Appropriate and Congruent  Thought Process:  Irrelevant and Descriptions of Associations: Loose  Orientation:  Full (Time, Place, and Person)  Thought Content:  Paranoid Ideation and Rumination  Suicidal Thoughts:  No  Homicidal Thoughts:  No  Memory:  Immediate;   Fair  Judgement:  Impaired  Insight:  Lacking  Psychomotor Activity:  Normal  Concentration:  Concentration: Fair  Recall:  Fair  Fund of Knowledge:  Poor  Language:  Fair  Akathisia:  Negative  Handed:  Right  AIMS (if indicated):     Assets:  Resilience Social Support  ADL's:  Intact  Cognition:  WNL  Sleep:  Number of Hours: 0.75     Treatment Plan Summary: Daily contact with patient to assess and evaluate symptoms and progress in treatment and Medication management   Continue current treatment plan on 5/32020 as listed below except for noted Bipolar with psychosis:  [place]ue Seroquel 200 mg p.o. nightly to 100 mg p.o. nightly Continue Risperdal  2 mg p.o. twice daily Continue Cogentin 0.5 mg p.o. nightly  Abilify maintain a received on 4/30 repeat 28 days Continue trazodone 100 mg p.o. nightly as needed   CSW to continue working on discharge disposition Patient encouraged to participate throughout the therapeutic milieu  Oneta Rack, NP 05/29/2018, 9:44 AM  Attest to NP Progress Note

## 2018-05-29 NOTE — [facility] Group Notes (Signed)
[facility] LCSW Group Therapy Note  Date/Time:  05/29/2018  11:00AM-12:00PM  Type of Therapy and Topic:  Group Therapy:  Music and Mood  Participation Level:  None   Description of Group: In this process group, members listened to a variety of genres of music and identified that different types of music evoke different responses.  Patients were encouraged to identify music that was soothing for them and music that was energizing for them.  Patients discussed how this knowledge can help with wellness and recovery in various ways including managing depression and anxiety as well as encouraging healthy sleep habits.    Therapeutic Goals: 1. Patients will explore the impact of different varieties of music on mood 2. Patients will verbalize the thoughts they have when listening to different types of music 3. Patients will identify music that is soothing to them as well as music that is energizing to them 4. Patients will discuss how to use this knowledge to assist in maintaining wellness and recovery 5. Patients will explore the use of music as a coping skill  Summary of Patient Progress:  At the beginning of group, patient was not present.  She arrived later in group but did not stay, left and returned twice.  Therapeutic Modalities: Solution Focused Brief Therapy Activity   Ambrose Mantle, LCSW

## 2018-05-30 MED ORDER — RISPERIDONE 3 MG PO TABS
3.0000 mg | ORAL_TABLET | Freq: Two times a day (BID) | ORAL | Status: DC
  Administered 2018-05-30 – 2018-05-31 (×2): 3 mg via ORAL
  Filled 2018-05-30 (×6): qty 1

## 2018-05-30 NOTE — Progress Notes (Signed)
[facility] MD Progress Note  05/30/2018 11:17 AM [name]  MRN:  [id number] Subjective:   Notes indicate compliance and somewhat better insight without thoughts of self-harm. More talkative today, much less paranoid However on 5/3 was described as pacing and responding to stimuli. Denies current thoughts of harming self or others Principal Problem: Intense paranoia is a function of bipolar with psychosis Diagnosis: Active Problems:   Bipolar 1 disorder (HCC)  Total Time spent with patient: 20 minutes Past Medical History:  Past Medical History:  Diagnosis Date  . ADD 10/22/2009  . ALLERGIC RHINITIS 09/26/2008  . Anemia   . ANXIETY 09/26/2008  . Asthma   . Complication of anesthesia    "tempermental" per patient  . CONSTIPATION 09/26/2008  . DEPRESSION 09/26/2008  . FATIGUE 01/14/2009  . Headache   . HYPERLIPIDEMIA 09/26/2008  . OTITIS MEDIA, LEFT 01/14/2009  . SKIN LESION 09/26/2008  . Sleep apnea   . TONSILLITIS, ACUTE 01/14/2009  . Unconscious state (HCC) 10/13/2017  . URI 10/22/2009    Past Surgical History:  Procedure Laterality Date  . CERVICAL POLYPECTOMY  2009  . DILATATION & CURETTAGE/HYSTEROSCOPY WITH MYOSURE N/A 11/17/2017   Procedure: DILATATION & CURETTAGE/HYSTEROSCOPY WITH MYOSURE;  Surgeon: Geryl Rankins, MD;  Location: [facility];  Service: Gynecology;  Laterality: N/A;  . extraction of wisdom teeth     error in charting  . polyp removal      Family History:  Family History  Problem Relation Age of Onset  . Diabetes Other   . Hypertension Other   . Thyroid disease Mother   . Hypertension Mother    Family Psychiatric  History: neg Social History:  Social History   Substance and Sexual Activity  Alcohol Use Not Currently     Social History   Substance and Sexual Activity  Drug Use No    Social History   Socioeconomic History  . Marital status: Single    Spouse name: Not on file  . Number of children: Not on file  . Years of education: Not on file  .  Highest education level: Not on file  Occupational History  . 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 Financial resource strain: Not on file  . Food insecurity:    Worry: Not on file    Inability: Not on file  . Transportation needs:    Medical: Not on file    Non-medical: Not on file  Tobacco Use  . Smoking status: Current Every Day Smoker    Packs/day: 0.50    Years: 20.00    Pack years: 10.00    Types: Cigarettes  . Smokeless tobacco: Never Used  Substance and Sexual Activity  . Alcohol use: Not Currently  . Drug use: No  . Sexual activity: Not Currently    Birth control/protection: Abstinence  Lifestyle  . Physical activity:    Days per week: Not on file    Minutes per session: Not on file  . Stress: Not on file  Relationships  . Social connections:    Talks on phone: Not on file    Gets together: Not on file    Attends religious service: Not on file    Active member of club or organization: Not on file    Attends meetings of clubs or organizations: Not on file    Relationship status: Not on file  Other Topics Concern  . Not on file  Social History Narrative   Lives with mother   Additional  Social History:    Longest period of sobriety (when/how long): six months  Negative Consequences of Use: Personal relationships                    Sleep: Fair  Appetite:  Fair  Current Medications: Current Facility-Administered Medications  Medication Dose Route Frequency Provider Last Rate Last Dose  . acetaminophen (TYLENOL) tablet 650 mg  650 mg Oral Q6H PRN Denzil Magnusonhomas, Lashunda, NP      . alum & mag hydroxide-simeth (MAALOX/MYLANTA) 200-200-20 MG/5ML suspension 30 mL  30 mL Oral Q4H PRN Denzil Magnusonhomas, Lashunda, NP      . ARIPiprazole ER (ABILIFY MAINTENA) injection 400 mg  400 mg Intramuscular Q28 days Malvin JohnsFarah, Ramie Erman, MD      . benztropine (COGENTIN) tablet 0.5 mg  0.5 mg Oral BID Malvin JohnsFarah, Evelio Rueda, MD   0.5 mg at 05/30/18 0756  . hydrOXYzine (ATARAX/VISTARIL) tablet  50 mg  50 mg Oral Q6H PRN Donell SievertSimon, Spencer E, PA-C   50 mg at 05/28/18 1906  . magnesium hydroxide (MILK OF MAGNESIA) suspension 30 mL  30 mL Oral Daily PRN Denzil Magnusonhomas, Lashunda, NP   30 mL at 05/29/18 1326  . pneumococcal 23 valent vaccine (PNU-IMMUNE) injection 0.5 mL  0.5 mL Intramuscular Tomorrow-1000 Malvin JohnsFarah, Trezure Cronk, MD      . QUEtiapine (SEROQUEL) tablet 100 mg  100 mg Oral QHS Oneta RackLewis, Tanika N, NP   100 mg at 05/29/18 2106  . risperiDONE (RISPERDAL) tablet 3 mg  3 mg Oral BID Malvin JohnsFarah, Ulys Favia, MD      . traZODone (DESYREL) tablet 100 mg  100 mg Oral QHS PRN,MR X 1 Kerry HoughSimon, Spencer E, PA-C   100 mg at 05/29/18 16100031    Lab Results:  Results for orders placed or performed during the hospital encounter of 05/25/18 (from the past 48 hour(s))  Pregnancy, urine     Status: None   Collection Time: 05/28/18 12:58 PM  Result Value Ref Range   Preg Test, Ur NEGATIVE NEGATIVE    Comment:        THE SENSITIVITY OF THIS METHODOLOGY IS >20 mIU/mL. Performed at [facility], [address] [place], [place] [address]     Blood Alcohol level:  Lab Results  Component Value Date   ETH <10 05/24/2018   ETH <10 10/13/2017    Metabolic Disorder Labs: Lab Results  Component Value Date   HGBA1C 5.1 05/26/2018   MPG 99.67 05/26/2018   No results found for: PROLACTIN Lab Results  Component Value Date   CHOL 138 05/26/2018   TRIG 90 05/26/2018   HDL 49 05/26/2018   CHOLHDL 2.8 05/26/2018   VLDL 18 05/26/2018   LDLCALC 71 05/26/2018   LDLCALC 104 (H) 03/11/2017    Physical Findings: AIMS: Facial and Oral Movements Muscles of Facial Expression: None, normal Lips and Perioral Area: None, normal Jaw: None, normal Tongue: None, normal,Extremity Movements Upper (arms, wrists, hands, fingers): None, normal Lower (legs, knees, ankles, toes): None, normal, Trunk Movements Neck, shoulders, hips: None, normal, Overall Severity Severity of abnormal movements (highest score from questions  above): None, normal Incapacitation due to abnormal movements: None, normal Patient's awareness of abnormal movements (rate only patient's report): No Awareness, Dental Status Current problems with teeth and/or dentures?: No Does patient usually wear dentures?: No  CIWA:  CIWA-Ar Total: 0 COWS:  COWS Total Score: 0  Musculoskeletal: Strength & Muscle Tone: within normal limits Gait & Station: normal Patient leans: N/A  Psychiatric Specialty Exam: Physical Exam  ROS  Blood pressure 111/62, pulse (!) 125, temperature 98 F (36.7 C), temperature source Oral, resp. rate 20, last menstrual period 04/24/2018, SpO2 100 %.There is no height or weight on file to calculate BMI.  General Appearance: Casual  Eye Contact:  Good  Speech:  Clear and Coherent  Volume:  Increased  Mood:  Anxious and Hypomanic at intervals  Affect:  Constricted  Thought Process:  Irrelevant and Descriptions of Associations: Tangential  Orientation:  Full (Time, Place, and Person)  Thought Content:  Delusions and Hallucinations: Auditory  Suicidal Thoughts:  No  Homicidal Thoughts:  No  Memory:  Immediate;   Fair  Judgement:  Fair  Insight:  Fair  Psychomotor Activity:  Normal  Concentration:  Concentration: Fair  Recall:  [employer] of Knowledge:  Fair  Language:  Good  Akathisia:  Negative  Handed:  Right  AIMS (if indicated):     Assets:  Social Support Talents/Skills Transportation  ADL's:  Intact  Cognition:  WNL  Sleep:  Number of Hours: 4.75     Treatment Plan Summary: Daily contact with patient to assess and evaluate symptoms and progress in treatment, Medication management and Plan Continue current cognitive and reality based therapy escalate Risperdal as reports indicate possible hallucinations yesterday continue reality-based therapies as mentioned and current precautions  Tanysha Quant, MD 05/30/2018, 11:17 AM

## 2018-05-30 NOTE — Plan of Care (Signed)
D: Patient is alert, oriented, pleasant, and cooperative. Denies SI, HI, AVH, and verbally contracts for safety. Affect is anxious. Patient reports she had a good day. Patient denies physical symptoms/pain.    A: Medications administered per MD order. Support provided. Patient educated on safety on the unit and medications. Routine safety checks every 15 minutes. Patient stated understanding to tell nurse about any new physical symptoms. Patient understands to tell staff of any needs.     R: No adverse drug reactions noted. Patient verbally contracts for safety. Patient remains safe at this time and will continue to monitor.   Problem: Education: Goal: Mental status will improve Outcome: Progressing   Problem: Safety: Goal: Periods of time without injury will increase Outcome: Progressing  Patient denies SI, HI, AVH, and contracts for safety. Patient remains safe and will continue to monitor.   [place] NOVEL CORONAVIRUS (COVID-19) DAILY CHECK-OFF SYMPTOMS - answer yes or no to each - every day NO YES  Have you had a fever in the past 24 hours?  Fever (Temp > 37.80C / 100F) X   Have you had any of these symptoms in the past 24 hours? New Cough  Sore Throat   Shortness of Breath  Difficulty Breathing  Unexplained Body Aches   X   Have you had any one of these symptoms in the past 24 hours not related to allergies?   Runny Nose  Nasal Congestion  Sneezing   X   If you have had runny nose, nasal congestion, sneezing in the past 24 hours, has it worsened?  X   EXPOSURES - check yes or no X   Have you traveled outside the state in the past 14 days?  X   Have you been in contact with someone with a confirmed diagnosis of COVID-19 or PUI in the past 14 days without wearing appropriate PPE?  X   Have you been living in the same home as a person with confirmed diagnosis of COVID-19 or a PUI (household contact)?    X   Have you been diagnosed with COVID-19?    X              What  to do next: Answered NO to all: Answered YES to anything:   Proceed with unit schedule Follow the [facility] Inpatient Flowsheet.

## 2018-05-30 NOTE — Progress Notes (Signed)
Recreation Therapy Notes  Date: 5.4.20 Time: 1000 Location: [address] Dayroom  Group Topic: Anxiety  Goal Area(s) Addresses:  Patient will identify triggers for anxiety. Patient will identify physical symptoms of anxiety Patient will identify coping skills for anxiety.  Behavioral Response: Engaged  Intervention:  Worksheet, pencils   Activity: Introduction to Anxiety.  Patients were to identify their triggers, physical symptoms, thoughts and coping skills for dealing with anxiety.  Education: Communication, Discharge Planning  Education Outcome: Acknowledges understanding/In group clarification offered/Needs additional education.   Clinical Observations/Feedback:  Pt identified triggers as slamming doors, people being disrespectful and being alone.  Pt stated physical symptoms were sweating, heart beating fast and body tenses up.  Pt expressed thoughts that go through her head are "when will this be over", thinking about massage and do people know I'm anxious.  Pt identified coping skills were walking, listening to music and swimming.   Caroll Rancher, LRT/CTRS     Caroll Rancher A 05/30/2018 11:16 AM

## 2018-05-30 NOTE — [facility] Group Notes (Signed)
[facility] LCSW Group Therapy Note  Date/Time: 05/30/18, 1315  Type of Therapy and Topic:  Group Therapy:  Overcoming Obstacles  Participation Level:  active  Description of Group:    In this group patients will be encouraged to explore what they see as obstacles to their own wellness and recovery. They will be guided to discuss their thoughts, feelings, and behaviors related to these obstacles. The group will process together ways to cope with barriers, with attention given to specific choices patients can make. Each patient will be challenged to identify changes they are motivated to make in order to overcome their obstacles. This group will be process-oriented, with patients participating in exploration of their own experiences as well as giving and receiving support and challenge from other group members.  Therapeutic Goals: 1. Patient will identify personal and current obstacles as they relate to admission. 2. Patient will identify barriers that currently interfere with their wellness or overcoming obstacles.  3. Patient will identify feelings, thought process and behaviors related to these barriers. 4. Patient will identify two changes they are willing to make to overcome these obstacles:    Summary of Patient Progress: Pt shared that current obstacles in her life are mental health and not taking her medication.  Pt showed good participation and made several appropriate comments today.        Therapeutic Modalities:   Cognitive Behavioral Therapy Solution Focused Therapy Motivational Interviewing Relapse Prevention Therapy  Daleen Squibb, LCSW

## 2018-05-30 NOTE — Progress Notes (Signed)
Progress note  Pt found in bed; compliant with medication administration. Pt denies any physical pain or symptoms. Pt is discussing discharge. Encouragement provided. Pt has participated in groups.  Pt safe on the unit. q21m safety checks implemented and continued. Will continue to monitor.

## 2018-05-31 MED ORDER — TRAZODONE HCL 100 MG PO TABS: 100.0000 mg | ORAL_TABLET | Freq: Every evening | ORAL | 1 refills | Status: DC | PRN

## 2018-05-31 MED ORDER — BENZTROPINE MESYLATE 0.5 MG PO TABS: 0.5000 mg | ORAL_TABLET | Freq: Two times a day (BID) | ORAL | 2 refills | Status: DC

## 2018-05-31 MED ORDER — RISPERIDONE 3 MG PO TABS: 6.0000 mg | ORAL_TABLET | Freq: Every day | ORAL | 2 refills | Status: DC

## 2018-05-31 MED ORDER — ARIPIPRAZOLE ER 400 MG IM SRER: 400.0000 mg | INTRAMUSCULAR | 11 refills | Status: DC

## 2018-05-31 NOTE — Discharge Summary (Signed)
Physician Discharge Summary Note  Patient:  [name] is an 38 y.o., female MRN:  [id number] DOB:  [birth year] Patient phone:  [phone] (home)  Patient address:   [address] [place] [address],  Total Time spent with patient: 45 minutes  Date of Admission:  05/25/2018 Date of Discharge: 06/15/2018  Reason for Admission:   This is a repeat admission for this 38 year old patient last here in September 2019.  She is known to have a diagnosis of bipolar disorder with a history of psychosis.  She is quite paranoid on this interview in fact will not speak to me in the office will not speak to me in the room even with an escort, states he must speak in the hallway and then halfway through the interview states she will not talk further when on an attorney present.  She had required petition back in 2019 as well and at that point was quite paranoid believes she is being followed, poison, spied on and so forth had flight of ideas insomnia and was treated with a combination of Lexapro 20 mg and Abilify 15 mg a day.  Current home med list includes Adderall at doses of 30 mg day - a continuation of Abilify if she is compliant as well as Lexapro. On her presentation yesterday she was noted to have thought blocking, intense paranoia, some confusion as to her situation, reports involved insomnia, cannabis abuse, and some mild degree of disorganization of thought and behavior-  Since on the floor is been a bit intrusive to the staff making odd requests and so forth so see recent notes again the patient will not make good eye contact she continues to be paranoid so paranoid that she will not trust an interview process stating "I do not trust you".  There is no much I can do with this no amount of reassurance will get her to give me a full her interview that we have above   Principal Problem: Bipolar with psychosis, complicated by stimulant usage cannabis usage Discharge Diagnoses: Active  Problems:   Bipolar 1 disorder [facility])  Past Medical History:  Past Medical History:  Diagnosis Date  . ADD 10/22/2009  . ALLERGIC RHINITIS 09/26/2008  . Anemia   . ANXIETY 09/26/2008  . Asthma   . Complication of anesthesia    "tempermental" per patient  . CONSTIPATION 09/26/2008  . DEPRESSION 09/26/2008  . FATIGUE 01/14/2009  . Headache   . HYPERLIPIDEMIA 09/26/2008  . OTITIS MEDIA, LEFT 01/14/2009  . SKIN LESION 09/26/2008  . Sleep apnea   . TONSILLITIS, ACUTE 01/14/2009  . Unconscious state (HCC) 10/13/2017  . URI 10/22/2009    Past Surgical History:  Procedure Laterality Date  . CERVICAL POLYPECTOMY  2009  . DILATATION & CURETTAGE/HYSTEROSCOPY WITH MYOSURE N/A 11/17/2017   Procedure: DILATATION & CURETTAGE/HYSTEROSCOPY WITH MYOSURE;  Surgeon: Geryl Rankins, MD;  Location: [facility];  Service: Gynecology;  Laterality: N/A;  . extraction of wisdom teeth     error in charting  . polyp removal      Family History:  Family History  Problem Relation Age of Onset  . Diabetes Other   . Hypertension Other   . Thyroid disease Mother   . Hypertension Mother     Social History:  Social History   Substance and Sexual Activity  Alcohol Use Not Currently     Social History   Substance and Sexual Activity  Drug Use No    Social History   Socioeconomic History  .  Marital status: Single    Spouse name: Not on file  . Number of children: Not on file  . Years of education: Not on file  . Highest education level: Not on file  Occupational History  . 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 Financial resource strain: Not on file  . Food insecurity:    Worry: Not on file    Inability: Not on file  . Transportation needs:    Medical: Not on file    Non-medical: Not on file  Tobacco Use  . Smoking status: Current Every Day Smoker    Packs/day: 0.50    Years: 20.00    Pack years: 10.00    Types: Cigarettes  . Smokeless tobacco: Never Used  Substance and Sexual Activity   . Alcohol use: Not Currently  . Drug use: No  . Sexual activity: Not Currently    Birth control/protection: Abstinence  Lifestyle  . Physical activity:    Days per week: Not on file    Minutes per session: Not on file  . Stress: Not on file  Relationships  . Social connections:    Talks on phone: Not on file    Gets together: Not on file    Attends religious service: Not on file    Active member of club or organization: Not on file    Attends meetings of clubs or organizations: Not on file    Relationship status: Not on file  Other Topics Concern  . Not on file  Social History Narrative   Lives with mother    Hospital Course:    As discussed patient is known to the service she has a diagnosis of a bipolar disorder complicated by intense paranoia, cannabis abuse, and prescriptions for stimulants. We believe these 2 compounds may have fueled her paranoia at any rate she was initially poorly cooperative and quite paranoid even towards examiners but she did not require forced medications and eventually began complying with the medication regimen ordered and she did stabilize affect her paranoia dissipated to the point where she was much more reality based though she still had some suspicion she was not going to act on them.  She denied hallucinations.  By the date of 5/5 was noted to be alert oriented cooperative affect appropriate speech and rate and tone denying thoughts of harming self or others no involuntary movements compliant with meds that she did receive long-acting injectable aripiprazole Next shot due at the end of the month  Physical Findings: AIMS: Facial and Oral Movements Muscles of Facial Expression: None, normal Lips and Perioral Area: None, normal Jaw: None, normal Tongue: None, normal,Extremity Movements Upper (arms, wrists, hands, fingers): None, normal Lower (legs, knees, ankles, toes): None, normal, Trunk Movements Neck, shoulders, hips: None, normal, Overall  Severity Severity of abnormal movements (highest score from questions above): None, normal Incapacitation due to abnormal movements: None, normal Patient's awareness of abnormal movements (rate only patient's report): No Awareness, Dental Status Current problems with teeth and/or dentures?: No Does patient usually wear dentures?: No  CIWA:  CIWA-Ar Total: 0 COWS:  COWS Total Score: 0 Musculoskeletal: Strength & Muscle Tone: within normal limits Gait & Station: normal Patient leans: N/A  Psychiatric Specialty Exam: ROS  Blood pressure 112/78, pulse (!) 120, temperature 97.8 F (36.6 C), temperature source Oral, resp. rate 20, last menstrual period 04/24/2018, SpO2 100 %.There is no height or weight on file to calculate BMI.  General Appearance: Casual  Eye Contact::  Good  Speech:  Clear and Coherent409  Volume:  Normal  Mood:  Euthymic  Affect:  Congruent  Thought Process:  Coherent and Linear  Orientation:  Full (Time, Place, and Person)  Thought Content:  Logical and Tangential  Suicidal Thoughts:  No  Homicidal Thoughts:  No  Memory:  Immediate;   Fair  Judgement:  Intact  Insight:  Fair  Psychomotor Activity:  Normal  Concentration:  Fair  Recall:  Fair  Fund of Knowledge:Fair  Language: Good  Akathisia:  Negative  Handed:  Right  AIMS (if indicated):     Assets:  Communication Skills Desire for Improvement Leisure Time Physical Health  Sleep:  Number of Hours: 5.25  Cognition: WNL  ADL's:  Intact    Have you used any form of tobacco in the last 30 days? (Cigarettes, Smokeless Tobacco, Cigars, and/or Pipes): Yes  Has this patient used any form of tobacco in the last 30 days? (Cigarettes, Smokeless Tobacco, Cigars, and/or Pipes) Yes, No  Blood Alcohol level:  Lab Results  Component Value Date   ETH <10 05/24/2018   ETH <10 10/13/2017    Metabolic Disorder Labs:  Lab Results  Component Value Date   HGBA1C 5.1 05/26/2018   MPG 99.67 05/26/2018   No  results found for: PROLACTIN Lab Results  Component Value Date   CHOL 138 05/26/2018   TRIG 90 05/26/2018   HDL 49 05/26/2018   CHOLHDL 2.8 05/26/2018   VLDL 18 05/26/2018   LDLCALC 71 05/26/2018   LDLCALC 104 (H) 03/11/2017    See Psychiatric Specialty Exam and Suicide Risk Assessment completed by Attending Physician prior to discharge.  Discharge destination:  Home  Is patient on multiple antipsychotic therapies at discharge:  No   Has Patient had three or more failed trials of antipsychotic monotherapy by history:  No  Recommended Plan for Multiple Antipsychotic Therapies: NA   Allergies as of 05/31/2018   No Known Allergies     Medication List    STOP taking these medications   amphetamine-dextroamphetamine 10 MG tablet Commonly known as:  Adderall   amphetamine-dextroamphetamine 20 MG tablet Commonly known as:  Adderall   ARIPiprazole 15 MG tablet Commonly known as:  ABILIFY Replaced by:  ARIPiprazole ER 400 MG Srer injection   azithromycin 250 MG tablet Commonly known as:  Zithromax Z-Pak   clonazePAM 0.5 MG tablet Commonly known as:  KLONOPIN   escitalopram 10 MG tablet Commonly known as:  LEXAPRO   hydrOXYzine 50 MG tablet Commonly known as:  ATARAX/VISTARIL   ibuprofen 800 MG tablet Commonly known as:  ADVIL   nicotine polacrilex 2 MG gum Commonly known as:  NICORETTE     TAKE these medications     Indication  albuterol 108 (90 Base) MCG/ACT inhaler Commonly known as:  VENTOLIN HFA Inhale 2 puffs into the lungs every 6 (six) hours as needed for wheezing or shortness of breath.  Indication:  Asthma   ARIPiprazole ER 400 MG Srer injection Commonly known as:  ABILIFY MAINTENA Inject 2 mLs (400 mg total) into the muscle every 28 (twenty-eight) days. Due 5/30 Start taking on:  Jun 23, 2018 Replaces:  ARIPiprazole 15 MG tablet  Indication:  Schizophrenia   benztropine 0.5 MG tablet Commonly known as:  COGENTIN Take 1 tablet (0.5 mg total) by  mouth 2 (two) times daily.  Indication:  Extrapyramidal Reaction caused by Medications   cyclobenzaprine 5 MG tablet Commonly known as:  FLEXERIL Take 1 tablet (5 mg total) by mouth  3 (three) times daily as needed for muscle spasms.  Indication:  Muscle Spasm   meloxicam 15 MG tablet Commonly known as:  MOBIC Take 1 tablet (15 mg total) by mouth daily. For arthritis pain  Indication:  Joint Damage causing Pain and Loss of Function   risperiDONE 3 MG tablet Commonly known as:  RISPERDAL Take 2 tablets (6 mg total) by mouth at bedtime.  Indication:  Schizophrenia   traZODone 100 MG tablet Commonly known as:  DESYREL Take 1 tablet (100 mg total) by mouth at bedtime as needed and may repeat dose one time if needed for sleep. What changed:    medication strength  how much to take  when to take this  Indication:  Trouble Sleeping      Follow-up Information    [facility] up on 06/06/2018.   Why:  Intake appointment for services with Alyssa is Monday, 5/11 at 11:30a.  Appointments will be held over video chat. Agency will email you a link for the appointments.  Please call within 24 hours of discharge to pay $20 deposit.  Contact information: [address] [place] [address] [phone]           SignedMalvin Johns, MD 05/31/2018, 11:26 AM

## 2018-05-31 NOTE — Progress Notes (Signed)
Pt d/c from the hospital with her mother. All items returned. D/C instructions given and prescriptions given. Pt denies si and hi.  

## 2018-05-31 NOTE — Progress Notes (Signed)
  [facility] Adult Case Management Discharge Plan :  Will you be returning to the same living situation after discharge:  Yes,  with mother At discharge, do you have transportation home?: Yes,  friend Do you have the ability to pay for your medications: Yes,  BCBS  Release of information consent forms completed and in the chart;  Patient's signature needed at discharge.  Patient to Follow up at: Follow-up Information    [facility] Follow up on 06/06/2018.   Why:  Intake appointment for services with Alyssa is Monday, 5/11 at 11:30a.  Appointments will be held over video chat. Agency will email you a link for the appointments.  Please call within 24 hours of discharge to pay $20 deposit.  Contact information: [address] [place] [address] [phone]           Next level of care provider has access to [facility] Link:no  Safety Planning and Suicide Prevention discussed: No. Pt declined consent  Have you used any form of tobacco in the last 30 days? (Cigarettes, Smokeless Tobacco, Cigars, and/or Pipes): Yes  Has patient been referred to the Quitline?: Yes, faxed on 05/31/18  Patient has been referred for addiction treatment: Pt. refused referral  Lorri Frederick, LCSW 05/31/2018, 10:20 AM

## 2018-05-31 NOTE — [facility] Group Notes (Signed)
[facility] LCSW Group Therapy Note  Date/Time: 05/31/2018 11am  Type of Therapy/Topic:  Group Therapy:  Feelings about Diagnosis  Participation Level:  Did Not Attend   Mood: N/A   Description of Group:    This group will allow patients to explore their thoughts and feelings about diagnoses they have received. Patients will be guided to explore their level of understanding and acceptance of these diagnoses. Facilitator will encourage patients to process their thoughts and feelings about the reactions of others to their diagnosis, and will guide patients in identifying ways to discuss their diagnosis with significant others in their lives. This group will be process-oriented, with patients participating in exploration of their own experiences as well as giving and receiving support and challenge from other group members.   Therapeutic Goals: 1. Patient will demonstrate understanding of diagnosis as evidence by identifying two or more symptoms of the disorder:  2. Patient will be able to express two feelings regarding the diagnosis 3. Patient will demonstrate ability to communicate their needs through discussion and/or role plays  Summary of Patient Progress:    Pt did not attend group.    Therapeutic Modalities:   Cognitive Behavioral Therapy Brief Therapy Feelings Identification   Daleen Squibb, LCSW

## 2018-05-31 NOTE — [facility] Suicide Risk Assessment (Signed)
[facility] Discharge Suicide Risk Assessment   Principal Problem: Acute/intense paranoia Discharge Diagnoses: Active Problems:   Bipolar 1 disorder (HCC)   Total Time spent with patient: 45 minutes  Musculoskeletal: Strength & Muscle Tone: within normal limits Gait & Station: normal Patient leans: N/A  Psychiatric Specialty Exam: ROS  Blood pressure 112/78, pulse (!) 120, temperature 97.8 F (36.6 C), temperature source Oral, resp. rate 20, last menstrual period 04/24/2018, SpO2 100 %.There is no height or weight on file to calculate BMI.  General Appearance: Casual  Eye Contact::  Good  Speech:  Clear and Coherent409  Volume:  Normal  Mood:  Euthymic  Affect:  Congruent  Thought Process:  Coherent and Linear  Orientation:  Full (Time, Place, and Person)  Thought Content:  Logical and Tangential  Suicidal Thoughts:  No  Homicidal Thoughts:  No  Memory:  Immediate;   Fair  Judgement:  Intact  Insight:  Fair  Psychomotor Activity:  Normal  Concentration:  Fair  Recall:  [employer] of Knowledge:Fair  Language: Good  Akathisia:  Negative  Handed:  Right  AIMS (if indicated):     Assets:  Communication Skills Desire for Improvement Leisure Time Physical Health  Sleep:  Number of Hours: 5.25  Cognition: WNL  ADL's:  Intact   Mental Status Per Nursing Assessment::   On Admission:  NA  Demographic Factors:  Caucasian  Loss Factors: Decrease in vocational status  Historical Factors: NA  Risk Reduction Factors:   NA  Continued Clinical Symptoms:  Bipolar Disorder:   Mixed State  Cognitive Features That Contribute To Risk:  Polarized thinking    Suicide Risk:  Minimal: No identifiable suicidal ideation.  Patients presenting with no risk factors but with morbid ruminations; may be classified as minimal risk based on the severity of the depressive symptoms  Follow-up Information    [facility] Follow up on 06/06/2018.   Why:  Intake appointment for  services with Alyssa is Monday, 5/11 at 11:30a.  Appointments will be held over video chat. Agency will email you a link for the appointments.  Please call within 24 hours of discharge to pay $20 deposit.  Contact information: [address] [place] [address] [phone]           Plan Of Care/Follow-up recommendations: See caseworker notes where this is detailed  Malvin Johns, MD 05/31/2018, 11:24 AM

## 2018-05-31 NOTE — Progress Notes (Signed)
Recreation Therapy Notes  Date: 5.5.20 Time: 1000 Location: 500 [address] Dayroom  Group Topic: Wellness  Goal Area(s) Addresses:  Patient will define components of whole wellness. Patient will verbalize benefit of whole wellness.  Behavioral Response: Engaged  Intervention: Fitness  Activity:  Exercise.  LRT led group in a series of stretches before going into chair and some standing exercises.  Patients were to follow along with the exercises presented in group.  Patients could take breaks and get water as needed.  Education: Wellness, Building control surveyor.   Education Outcome: Acknowledges education/In group clarification offered/Needs additional education.   Clinical Observations/Feedback: Pt was engaged and pleasant.  Pt completed exercises and stretches while in group.  Pt left early and did not return.    Caroll Rancher, LRT/CTRS     Caroll Rancher A 05/31/2018 10:38 AM

## 2018-06-03 ENCOUNTER — Ambulatory Visit: Payer: Self-pay | Admitting: Internal Medicine

## 2018-06-03 NOTE — Telephone Encounter (Signed)
    1  [name] Female, 38 y.o., 11-Sep-1980 MRN:  [id number] Phone:  [phone] Judie Petit) PCP:  Corwin Levins, MD Coverage:  Cyndee Brightly CROSS BLUE [employer]IELD/BCBS OTHER Message from [facility] sent at 06/03/2018 10:57 AM EDT   Summary: spider bite   Pt mother called in stating patient was bitten possibly by a poisonous spider. Pt mother stated patient told her that her leg is swollen and she can barely walk. Pt mother was not with the patient but requests that a nurse contact her daughter at [phone]        Call History    Type Contact  06/03/2018 11:15 AM Phone (Outgoing) Olamide, Alpha (Self)  Phone: [phone]  User: Lannie Fields, RN  Not Available -  Mailbox is full.  06/03/2018 10:52 AM Phone (Incoming) Scott,Cindy (Mother)  User: Marylen Ponto  Encounter Report   Patient Encounter Report

## 2018-06-03 NOTE — Telephone Encounter (Signed)
Attempted to contact pt; unable to leave message on voicemail; pre-recorded message states mailbox is full; unable to complete nurse triage; the pt is normally seen by Dr Oliver Barre, [facility]; will route to office for notification.

## 2018-06-03 NOTE — Telephone Encounter (Signed)
Noted  

## 2018-06-15 ENCOUNTER — Telehealth: Payer: Self-pay | Admitting: Internal Medicine

## 2018-06-15 NOTE — Telephone Encounter (Signed)
Copied from CRM [id number]. Topic: Quick Communication - Rx Refill/Question >> Jun 15, 2018 11:08 AM Crist Infante wrote: Medication:  amphetamine-dextroamphetamine (ADDERALL) 20 MG tablet amphetamine-dextroamphetamine (ADDERALL) 10 MG tablet (not on current med list)  [id number] Gin[place] Otto, [place] - [address] AT [facility] [address] [phone] (Phone) [phone] (Fax)

## 2018-06-15 NOTE — Telephone Encounter (Signed)
Noted  

## 2018-06-15 NOTE — Telephone Encounter (Signed)
Very sorry, but these were stopped at her last hospn 5/5 (for bipolar with psychosis) per psychiatry, so I would not feel comfortable with re-starting (since they were stopped intentionally)

## 2018-08-21 ENCOUNTER — Other Ambulatory Visit: Payer: Self-pay

## 2018-08-21 ENCOUNTER — Emergency Department (HOSPITAL_COMMUNITY): Payer: Self-pay

## 2018-08-21 ENCOUNTER — Encounter (HOSPITAL_COMMUNITY): Payer: Self-pay | Admitting: Emergency Medicine

## 2018-08-21 ENCOUNTER — Emergency Department (HOSPITAL_COMMUNITY)
Admission: EM | Admit: 2018-08-21 | Discharge: 2018-08-21 | Disposition: A | Discharge status: Home / Self Care | Payer: Self-pay | Attending: Emergency Medicine | Admitting: Emergency Medicine

## 2018-08-21 DIAGNOSIS — Y92481 Parking lot as the place of occurrence of the external cause: Secondary | ICD-10-CM | POA: Insufficient documentation

## 2018-08-21 DIAGNOSIS — Y999 Unspecified external cause status: Secondary | ICD-10-CM | POA: Insufficient documentation

## 2018-08-21 DIAGNOSIS — F17210 Nicotine dependence, cigarettes, uncomplicated: Secondary | ICD-10-CM | POA: Insufficient documentation

## 2018-08-21 DIAGNOSIS — J45909 Unspecified asthma, uncomplicated: Secondary | ICD-10-CM | POA: Insufficient documentation

## 2018-08-21 DIAGNOSIS — X500XXA Overexertion from strenuous movement or load, initial encounter: Secondary | ICD-10-CM | POA: Insufficient documentation

## 2018-08-21 DIAGNOSIS — Z79899 Other long term (current) drug therapy: Secondary | ICD-10-CM | POA: Insufficient documentation

## 2018-08-21 DIAGNOSIS — S82851A Displaced trimalleolar fracture of right lower leg, initial encounter for closed fracture: Secondary | ICD-10-CM | POA: Insufficient documentation

## 2018-08-21 DIAGNOSIS — Y9301 Activity, walking, marching and hiking: Secondary | ICD-10-CM | POA: Insufficient documentation

## 2018-08-21 MED ORDER — FENTANYL CITRATE (PF) 100 MCG/2ML IJ SOLN
50.0000 ug | Freq: Once | INTRAMUSCULAR | Status: AC
  Administered 2018-08-21: 50 ug via INTRAVENOUS
  Filled 2018-08-21: qty 2

## 2018-08-21 MED ORDER — IBUPROFEN 800 MG PO TABS: 800.0000 mg | ORAL_TABLET | Freq: Three times a day (TID) | ORAL | 0 refills | Status: DC

## 2018-08-21 MED ORDER — ONDANSETRON 4 MG PO TBDP: 4.0000 mg | ORAL_TABLET | Freq: Three times a day (TID) | ORAL | 0 refills | Status: DC | PRN

## 2018-08-21 MED ORDER — FENTANYL CITRATE (PF) 100 MCG/2ML IJ SOLN
100.0000 ug | Freq: Once | INTRAMUSCULAR | Status: AC
  Administered 2018-08-21: 100 ug via INTRAVENOUS
  Filled 2018-08-21: qty 2

## 2018-08-21 MED ORDER — OXYCODONE-ACETAMINOPHEN 5-325 MG PO TABS: 1.0000 | ORAL_TABLET | ORAL | 0 refills | Status: DC | PRN

## 2018-08-21 MED ORDER — LIDOCAINE-EPINEPHRINE (PF) 2 %-1:200000 IJ SOLN
10.0000 mL | Freq: Once | INTRAMUSCULAR | Status: AC
  Administered 2018-08-21: 10 mL via INTRADERMAL
  Filled 2018-08-21: qty 10

## 2018-08-21 MED ORDER — ONDANSETRON 4 MG PO TBDP
4.0000 mg | ORAL_TABLET | Freq: Once | ORAL | Status: AC
  Administered 2018-08-21: 4 mg via ORAL
  Filled 2018-08-21: qty 1

## 2018-08-21 MED ORDER — LORAZEPAM 2 MG/ML IJ SOLN
1.0000 mg | Freq: Once | INTRAMUSCULAR | Status: AC
  Administered 2018-08-21: 1 mg via INTRAVENOUS
  Filled 2018-08-21: qty 1

## 2018-08-21 MED ORDER — OXYCODONE-ACETAMINOPHEN 5-325 MG PO TABS
1.0000 | ORAL_TABLET | Freq: Once | ORAL | Status: AC
  Administered 2018-08-21: 1 via ORAL
  Filled 2018-08-21: qty 1
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
## 2018-08-21 NOTE — Progress Notes (Signed)
Orthopedic Tech Progress Note Patient Details:  [name] December 12, 1980 [id number]  Ortho Devices Type of Ortho Device: Post (short leg) splint, Stirrup splint Ortho Device/Splint Location: rle. applied post reduction with drs assistance. Ortho Device/Splint Interventions: Ordered, Application, Adjustment   Post Interventions Patient Tolerated: Well Instructions Provided: Care of device, Adjustment of device   Karolee Stamps 08/21/2018, 3:35 AM

## 2018-08-21 NOTE — Discharge Instructions (Signed)
Take the prescribed medication as directed.  Try to keep foot elevated to help with swelling. Follow-up with Dr. Lorin Mercy-- call office first thing Monday morning for appt. Return to the ED for new or worsening symptoms.

## 2018-08-21 NOTE — ED Triage Notes (Signed)
Pt presents by [facility] for evaluation of right ankle injury after tripping and landing on back. Deformity noted to right ankle. Pulse, movement, and sensation present. Not open wound per EMS.

## 2018-08-23 ENCOUNTER — Other Ambulatory Visit (HOSPITAL_COMMUNITY)
Admission: RE | Admit: 2018-08-23 | Discharge: 2018-08-23 | Disposition: A | Discharge status: Home / Self Care | Payer: HRSA Program | Source: Ambulatory Visit | Attending: Orthopaedic Surgery | Admitting: Orthopaedic Surgery

## 2018-08-23 ENCOUNTER — Encounter (HOSPITAL_COMMUNITY): Payer: Self-pay | Admitting: *Deleted

## 2018-08-23 ENCOUNTER — Other Ambulatory Visit: Payer: Self-pay

## 2018-08-23 ENCOUNTER — Ambulatory Visit (INDEPENDENT_AMBULATORY_CARE_PROVIDER_SITE_OTHER): Payer: Self-pay | Admitting: Orthopaedic Surgery

## 2018-08-23 ENCOUNTER — Encounter: Payer: Self-pay | Admitting: Orthopaedic Surgery

## 2018-08-23 ENCOUNTER — Telehealth: Payer: Self-pay

## 2018-08-23 DIAGNOSIS — S82853A Displaced trimalleolar fracture of unspecified lower leg, initial encounter for closed fracture: Secondary | ICD-10-CM | POA: Insufficient documentation

## 2018-08-23 DIAGNOSIS — S82851A Displaced trimalleolar fracture of right lower leg, initial encounter for closed fracture: Secondary | ICD-10-CM

## 2018-08-23 DIAGNOSIS — Z20828 Contact with and (suspected) exposure to other viral communicable diseases: Secondary | ICD-10-CM | POA: Insufficient documentation

## 2018-08-23 NOTE — Telephone Encounter (Signed)
Do you have instructions for patient?

## 2018-08-23 NOTE — Telephone Encounter (Signed)
Patients mom Luretha Rued LM on triage phone stating that patient is scheduled for surgery tomorrow but they have no instructions. Asking for call back to discuss [phone]
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
## 2018-08-23 NOTE — Anesthesia Preprocedure Evaluation (Addendum)
Anesthesia Evaluation  Patient identified by MRN, date of birth, ID band Patient awake    Reviewed: Allergy & Precautions, H&P , NPO status , Patient's Chart, lab work & pertinent test results  Airway Mallampati: II  TM Distance: >3 FB Neck ROM: Full    Dental no notable dental hx. (+) Teeth Intact, Dental Advisory Given   Pulmonary asthma , sleep apnea , Current Smoker,    Pulmonary exam normal breath sounds clear to auscultation       Cardiovascular negative cardio ROS   Rhythm:Regular Rate:Normal     Neuro/Psych  Headaches, Anxiety Depression Bipolar Disorder    GI/Hepatic negative GI ROS, Neg liver ROS,   Endo/Other  negative endocrine ROS  Renal/GU negative Renal ROS  negative genitourinary   Musculoskeletal   Abdominal   Peds  Hematology  (+) Blood dyscrasia, anemia ,   Anesthesia Other Findings   Reproductive/Obstetrics negative OB ROS                            Anesthesia Physical Anesthesia Plan  ASA: III  Anesthesia Plan: General   Post-op Pain Management:  Regional for Post-op pain   Induction: Intravenous  PONV Risk Score and Plan: 3 and Ondansetron, Dexamethasone and Midazolam  Airway Management Planned: LMA  Additional Equipment:   Intra-op Plan:   Post-operative Plan: Extubation in OR  Informed Consent: I have reviewed the patients History and Physical, chart, labs and discussed the procedure including the risks, benefits and alternatives for the proposed anesthesia with the patient or authorized representative who has indicated his/her understanding and acceptance.     Dental advisory given  Plan Discussed with: CRNA  Anesthesia Plan Comments:         Anesthesia Quick Evaluation

## 2018-08-24 ENCOUNTER — Encounter (HOSPITAL_COMMUNITY): Payer: Self-pay | Admitting: General Practice

## 2018-08-24 ENCOUNTER — Encounter (HOSPITAL_COMMUNITY)
Admission: RE | Disposition: A | Discharge status: Home / Self Care | Payer: Self-pay | Source: Home / Self Care | Attending: Orthopaedic Surgery

## 2018-08-24 ENCOUNTER — Ambulatory Visit (HOSPITAL_COMMUNITY)
Admission: RE | Admit: 2018-08-24 | Discharge: 2018-08-24 | Disposition: A | Discharge status: Home / Self Care | Payer: Self-pay | Attending: Orthopaedic Surgery | Admitting: Orthopaedic Surgery

## 2018-08-24 ENCOUNTER — Ambulatory Visit (HOSPITAL_COMMUNITY): Payer: Self-pay | Admitting: Anesthesiology

## 2018-08-24 ENCOUNTER — Other Ambulatory Visit: Payer: Self-pay

## 2018-08-24 ENCOUNTER — Ambulatory Visit (HOSPITAL_COMMUNITY): Payer: Self-pay

## 2018-08-24 DIAGNOSIS — F419 Anxiety disorder, unspecified: Secondary | ICD-10-CM | POA: Insufficient documentation

## 2018-08-24 DIAGNOSIS — F17210 Nicotine dependence, cigarettes, uncomplicated: Secondary | ICD-10-CM | POA: Insufficient documentation

## 2018-08-24 DIAGNOSIS — Z79899 Other long term (current) drug therapy: Secondary | ICD-10-CM | POA: Insufficient documentation

## 2018-08-24 DIAGNOSIS — E785 Hyperlipidemia, unspecified: Secondary | ICD-10-CM | POA: Insufficient documentation

## 2018-08-24 DIAGNOSIS — S82853A Displaced trimalleolar fracture of unspecified lower leg, initial encounter for closed fracture: Secondary | ICD-10-CM | POA: Diagnosis present

## 2018-08-24 DIAGNOSIS — F319 Bipolar disorder, unspecified: Secondary | ICD-10-CM | POA: Insufficient documentation

## 2018-08-24 DIAGNOSIS — G4730 Sleep apnea, unspecified: Secondary | ICD-10-CM | POA: Insufficient documentation

## 2018-08-24 DIAGNOSIS — S82851A Displaced trimalleolar fracture of right lower leg, initial encounter for closed fracture: Secondary | ICD-10-CM

## 2018-08-24 DIAGNOSIS — J45909 Unspecified asthma, uncomplicated: Secondary | ICD-10-CM | POA: Insufficient documentation

## 2018-08-24 DIAGNOSIS — Z419 Encounter for procedure for purposes other than remedying health state, unspecified: Secondary | ICD-10-CM

## 2018-08-24 DIAGNOSIS — W19XXXA Unspecified fall, initial encounter: Secondary | ICD-10-CM | POA: Insufficient documentation

## 2018-08-24 HISTORY — DX: Irritable bowel syndrome, unspecified: K58.9

## 2018-08-24 HISTORY — DX: Unspecified intracranial injury with loss of consciousness of unspecified duration, initial encounter: S06.9X9A

## 2018-08-24 HISTORY — PX: ORIF ANKLE FRACTURE: SHX5408

## 2018-08-24 LAB — URINALYSIS, COMPLETE (UACMP) WITH MICROSCOPIC
Bilirubin Urine: NEGATIVE
Glucose, UA: NEGATIVE mg/dL
Hgb urine dipstick: NEGATIVE
Ketones, ur: 20 mg/dL — AB
Leukocytes,Ua: NEGATIVE
Nitrite: NEGATIVE
Protein, ur: 30 mg/dL — AB
Specific Gravity, Urine: 1.03 (ref 1.005–1.030)
pH: 5 (ref 5.0–8.0)

## 2018-08-24 LAB — CBC
HCT: 39.3 % (ref 36.0–46.0)
Hemoglobin: 12.1 g/dL (ref 12.0–15.0)
MCH: 30 pg (ref 26.0–34.0)
MCHC: 30.8 g/dL (ref 30.0–36.0)
MCV: 97.5 fL (ref 80.0–100.0)
Platelets: 269 10*3/uL (ref 150–400)
RBC: 4.03 MIL/uL (ref 3.87–5.11)
RDW: 13.9 % (ref 11.5–15.5)
WBC: 12.8 10*3/uL — ABNORMAL HIGH (ref 4.0–10.5)
nRBC: 0 % (ref 0.0–0.2)

## 2018-08-24 LAB — BASIC METABOLIC PANEL
Anion gap: 11 (ref 5–15)
BUN: <5 mg/dL — ABNORMAL LOW (ref 6–20)
CO2: 20 mmol/L — ABNORMAL LOW (ref 22–32)
Calcium: 8.9 mg/dL (ref 8.9–10.3)
Chloride: 107 mmol/L (ref 98–111)
Creatinine, Ser: 0.59 mg/dL (ref 0.44–1.00)
GFR calc Af Amer: >60 mL/min (ref >60)
GFR calc non Af Amer: >60 mL/min (ref >60)
Glucose, Bld: 105 mg/dL — ABNORMAL HIGH (ref 70–99)
Potassium: 3.5 mmol/L (ref 3.5–5.1)
Sodium: 138 mmol/L (ref 135–145)

## 2018-08-24 LAB — SARS CORONAVIRUS 2 (TAT 6-24 HRS): SARS Coronavirus 2: NEGATIVE

## 2018-08-24 LAB — POCT PREGNANCY, URINE: Preg Test, Ur: NEGATIVE

## 2018-08-24 SURGERY — OPEN REDUCTION INTERNAL FIXATION (ORIF) ANKLE FRACTURE
Anesthesia: Regional | Laterality: Right

## 2018-08-24 MED ORDER — 0.9 % SODIUM CHLORIDE (POUR BTL) OPTIME
TOPICAL | Status: DC | PRN
  Administered 2018-08-24: 08:00:00 1000 mL

## 2018-08-24 MED ORDER — ACETAMINOPHEN 500 MG PO TABS: 1000.0000 mg | ORAL_TABLET | Freq: Once | ORAL | Status: DC

## 2018-08-24 MED ORDER — CHLORHEXIDINE GLUCONATE 4 % EX LIQD: 60.0000 mL | Freq: Once | CUTANEOUS | Status: DC

## 2018-08-24 MED ORDER — CEFAZOLIN SODIUM-DEXTROSE 2-4 GM/100ML-% IV SOLN
2.0000 g | INTRAVENOUS | Status: AC
  Administered 2018-08-24: 2 g via INTRAVENOUS

## 2018-08-24 MED ORDER — ONDANSETRON HCL 4 MG/2ML IJ SOLN
INTRAMUSCULAR | Status: AC
  Filled 2018-08-24: qty 2

## 2018-08-24 MED ORDER — FENTANYL CITRATE (PF) 100 MCG/2ML IJ SOLN
INTRAMUSCULAR | Status: DC | PRN
  Administered 2018-08-24: 50 ug via INTRAVENOUS
  Administered 2018-08-24: 100 ug via INTRAVENOUS

## 2018-08-24 MED ORDER — LIDOCAINE 2% (20 MG/ML) 5 ML SYRINGE
INTRAMUSCULAR | Status: AC
  Filled 2018-08-24: qty 5

## 2018-08-24 MED ORDER — OXYCODONE-ACETAMINOPHEN 5-325 MG PO TABS
ORAL_TABLET | ORAL | Status: AC
  Filled 2018-08-24: qty 2

## 2018-08-24 MED ORDER — HYDROMORPHONE HCL 1 MG/ML IJ SOLN: 0.2500 mg | INTRAMUSCULAR | Status: DC | PRN

## 2018-08-24 MED ORDER — OXYCODONE-ACETAMINOPHEN 5-325 MG PO TABS
2.0000 | ORAL_TABLET | Freq: Once | ORAL | Status: AC
  Administered 2018-08-24: 2 via ORAL

## 2018-08-24 MED ORDER — PROPOFOL 10 MG/ML IV BOLUS
INTRAVENOUS | Status: AC
  Filled 2018-08-24: qty 40

## 2018-08-24 MED ORDER — BUPIVACAINE-EPINEPHRINE (PF) 0.5% -1:200000 IJ SOLN
INTRAMUSCULAR | Status: DC | PRN
  Administered 2018-08-24: 30 mL via PERINEURAL

## 2018-08-24 MED ORDER — MIDAZOLAM HCL 5 MG/5ML IJ SOLN
INTRAMUSCULAR | Status: DC | PRN
  Administered 2018-08-24: 2 mg via INTRAVENOUS

## 2018-08-24 MED ORDER — OXYCODONE-ACETAMINOPHEN 5-325 MG PO TABS: 1.0000 | ORAL_TABLET | Freq: Four times a day (QID) | ORAL | 0 refills | Status: DC | PRN

## 2018-08-24 MED ORDER — POVIDONE-IODINE 10 % EX SWAB: 2.0000 "application " | Freq: Once | CUTANEOUS | Status: DC

## 2018-08-24 MED ORDER — ONDANSETRON HCL 4 MG/2ML IJ SOLN
INTRAMUSCULAR | Status: DC | PRN
  Administered 2018-08-24: 4 mg via INTRAVENOUS

## 2018-08-24 MED ORDER — ACETAMINOPHEN 500 MG PO TABS
500.0000 mg | ORAL_TABLET | Freq: Once | ORAL | Status: AC
  Administered 2018-08-24: 07:00:00 500 mg via ORAL
  Filled 2018-08-24: qty 1

## 2018-08-24 MED ORDER — BUPIVACAINE HCL (PF) 0.5 % IJ SOLN
INTRAMUSCULAR | Status: DC | PRN
  Administered 2018-08-24: 10 mL

## 2018-08-24 MED ORDER — ACETAMINOPHEN 500 MG PO TABS
ORAL_TABLET | ORAL | Status: AC
  Administered 2018-08-24: 07:00:00 500 mg via ORAL
  Filled 2018-08-24: qty 2

## 2018-08-24 MED ORDER — BUPIVACAINE HCL (PF) 0.25 % IJ SOLN
INTRAMUSCULAR | Status: AC
  Filled 2018-08-24: qty 30

## 2018-08-24 MED ORDER — MIDAZOLAM HCL 2 MG/2ML IJ SOLN
INTRAMUSCULAR | Status: AC
  Filled 2018-08-24: qty 2

## 2018-08-24 MED ORDER — CEFAZOLIN SODIUM-DEXTROSE 2-4 GM/100ML-% IV SOLN
2.0000 g | Freq: Once | INTRAVENOUS | Status: DC
  Filled 2018-08-24: qty 100

## 2018-08-24 MED ORDER — DEXAMETHASONE SODIUM PHOSPHATE 10 MG/ML IJ SOLN
INTRAMUSCULAR | Status: AC
  Filled 2018-08-24: qty 1

## 2018-08-24 MED ORDER — ARTIFICIAL TEARS OPHTHALMIC OINT: TOPICAL_OINTMENT | OPHTHALMIC | Status: DC | PRN

## 2018-08-24 MED ORDER — STERILE WATER FOR IRRIGATION IR SOLN
Status: DC | PRN
  Administered 2018-08-24: 1000 mL

## 2018-08-24 MED ORDER — LACTATED RINGERS IV SOLN
INTRAVENOUS | Status: DC | PRN
  Administered 2018-08-24: 07:00:00 via INTRAVENOUS

## 2018-08-24 MED ORDER — PROPOFOL 10 MG/ML IV BOLUS
INTRAVENOUS | Status: DC | PRN
  Administered 2018-08-24: 100 mg via INTRAVENOUS
  Administered 2018-08-24: 200 mg via INTRAVENOUS
  Administered 2018-08-24: 100 mg via INTRAVENOUS

## 2018-08-24 MED ORDER — FENTANYL CITRATE (PF) 250 MCG/5ML IJ SOLN
INTRAMUSCULAR | Status: AC
  Filled 2018-08-24: qty 5

## 2018-08-24 MED ORDER — FENTANYL CITRATE (PF) 100 MCG/2ML IJ SOLN
INTRAMUSCULAR | Status: AC
  Filled 2018-08-24: qty 2

## 2018-08-24 SURGICAL SUPPLY — 70 items
BANDAGE ELASTIC 6 VELCRO ST LF (GAUZE/BANDAGES/DRESSINGS) ×1 IMPLANT
BANDAGE ESMARK 6X9 LF (GAUZE/BANDAGES/DRESSINGS) ×1 IMPLANT
BIT DRILL 2.5X2.75 QC CALB (BIT) ×1 IMPLANT
BIT DRILL 2.9 CANN QC NONSTRL (BIT) ×1 IMPLANT
BIT DRILL CALIBRATED 2.7 (BIT) ×1 IMPLANT
BNDG CMPR 9X6 STRL LF SNTH (GAUZE/BANDAGES/DRESSINGS) ×1
BNDG ELASTIC 4X5.8 VLCR STR LF (GAUZE/BANDAGES/DRESSINGS) IMPLANT
BNDG ELASTIC 6X5.8 VLCR STR LF (GAUZE/BANDAGES/DRESSINGS) IMPLANT
BNDG ESMARK 6X9 LF (GAUZE/BANDAGES/DRESSINGS) ×2
COVER MAYO STAND STRL (DRAPES) ×4 IMPLANT
COVER SURGICAL LIGHT HANDLE (MISCELLANEOUS) ×2 IMPLANT
COVER WAND RF STERILE (DRAPES) ×1 IMPLANT
CUFF TOURN SGL QUICK 34 (TOURNIQUET CUFF) ×2
CUFF TOURN SGL QUICK 42 (TOURNIQUET CUFF) IMPLANT
CUFF TRNQT CYL 34X4.125X (TOURNIQUET CUFF) IMPLANT
DRAPE C-ARM 42X72 X-RAY (DRAPES) IMPLANT
DRAPE HALF SHEET 40X57 (DRAPES) ×1 IMPLANT
DRAPE INCISE IOBAN 66X45 STRL (DRAPES) IMPLANT
DRAPE U-SHAPE 47X51 STRL (DRAPES) ×2 IMPLANT
DRSG PAD ABDOMINAL 8X10 ST (GAUZE/BANDAGES/DRESSINGS) ×2 IMPLANT
DURAPREP 26ML APPLICATOR (WOUND CARE) ×2 IMPLANT
ELECT REM PT RETURN 9FT ADLT (ELECTROSURGICAL) ×2
ELECTRODE REM PT RTRN 9FT ADLT (ELECTROSURGICAL) ×1 IMPLANT
GAUZE SPONGE 4X4 12PLY STRL (GAUZE/BANDAGES/DRESSINGS) ×2 IMPLANT
GAUZE XEROFORM 5X9 LF (GAUZE/BANDAGES/DRESSINGS) ×2 IMPLANT
GLOVE BIOGEL PI IND STRL 8 (GLOVE) ×2 IMPLANT
GLOVE BIOGEL PI INDICATOR 8 (GLOVE) ×1
GLOVE ORTHO TXT STRL SZ7.5 (GLOVE) ×4 IMPLANT
GOWN STRL REUS W/ TWL LRG LVL3 (GOWN DISPOSABLE) ×1 IMPLANT
GOWN STRL REUS W/ TWL XL LVL3 (GOWN DISPOSABLE) ×1 IMPLANT
GOWN STRL REUS W/TWL 2XL LVL3 (GOWN DISPOSABLE) ×2 IMPLANT
GOWN STRL REUS W/TWL LRG LVL3 (GOWN DISPOSABLE) ×4
GOWN STRL REUS W/TWL XL LVL3 (GOWN DISPOSABLE) ×2
K-WIRE ACE 1.6X6 (WIRE) ×4
KIT BASIN OR (CUSTOM PROCEDURE TRAY) ×2 IMPLANT
KIT TURNOVER KIT B (KITS) ×2 IMPLANT
KWIRE ACE 1.6X6 (WIRE) ×2 IMPLANT
MANIFOLD NEPTUNE II (INSTRUMENTS) IMPLANT
NS IRRIG 1000ML POUR BTL (IV SOLUTION) ×2 IMPLANT
PACK ORTHO EXTREMITY (CUSTOM PROCEDURE TRAY) ×2 IMPLANT
PAD ABD 8X10 STRL (GAUZE/BANDAGES/DRESSINGS) ×2 IMPLANT
PAD ARMBOARD 7.5X6 YLW CONV (MISCELLANEOUS) ×3 IMPLANT
PAD CAST 4YDX4 CTTN HI CHSV (CAST SUPPLIES) ×1 IMPLANT
PADDING CAST COTTON 4X4 STRL (CAST SUPPLIES) ×2
PADDING CAST COTTON 6X4 STRL (CAST SUPPLIES) ×8 IMPLANT
PLATE LOCK 8H 103 BILAT FIB (Plate) ×1 IMPLANT
SCREW ACE CAN 4.0 40M (Screw) ×2 IMPLANT
SCREW ACE CAN 4.0 48M (Screw) ×2 IMPLANT
SCREW CORTICAL 3.5MM 18MM (Screw) ×1 IMPLANT
SCREW CORTICAL 3.5MM 22MM (Screw) ×1 IMPLANT
SCREW LOCK CORT STAR 3.5X12 (Screw) ×2 IMPLANT
SCREW LOCK CORT STAR 3.5X14 (Screw) ×1 IMPLANT
SCREW LOCK CORT STAR 3.5X16 (Screw) ×2 IMPLANT
SCREW NON LOCKING LP 3.5 14MM (Screw) ×6 IMPLANT
SCREW NON LOCKING LP 3.5 16MM (Screw) ×1 IMPLANT
SPLINT PLASTER CAST XFAST 5X30 (CAST SUPPLIES) IMPLANT
SPLINT PLASTER XFAST SET 5X30 (CAST SUPPLIES) ×2
SPONGE LAP 18X18 RF (DISPOSABLE) ×1 IMPLANT
STAPLER VISISTAT 35W (STAPLE) ×1 IMPLANT
SUCTION FRAZIER HANDLE 10FR (MISCELLANEOUS) ×2
SUCTION TUBE FRAZIER 10FR DISP (MISCELLANEOUS) ×1 IMPLANT
SUT ETHILON 3 0 PS 1 (SUTURE) ×2 IMPLANT
SUT VIC AB 2-0 CT1 27 (SUTURE) ×4
SUT VIC AB 2-0 CT1 TAPERPNT 27 (SUTURE) ×2 IMPLANT
SYR CONTROL 10ML LL (SYRINGE) ×1 IMPLANT
TOWEL GREEN STERILE (TOWEL DISPOSABLE) ×2 IMPLANT
TOWEL GREEN STERILE FF (TOWEL DISPOSABLE) ×2 IMPLANT
TUBE CONNECTING 12X1/4 (SUCTIONS) ×2 IMPLANT
WATER STERILE IRR 1000ML POUR (IV SOLUTION) ×2 IMPLANT
YANKAUER SUCT BULB TIP NO VENT (SUCTIONS) ×3 IMPLANT

## 2018-08-24 NOTE — Anesthesia Postprocedure Evaluation (Signed)
Anesthesia Post Note  Patient: [name]  Procedure(s) Performed: OPEN REDUCTION INTERNAL FIXATION TRIMALLEOLAR ANKLE FRACTURE (Right )     Patient location during evaluation: PACU Anesthesia Type: Regional and General Level of consciousness: awake and alert Pain management: pain level controlled Vital Signs Assessment: post-procedure vital signs reviewed and stable Respiratory status: spontaneous breathing, nonlabored ventilation and respiratory function stable Cardiovascular status: blood pressure returned to baseline and stable Postop Assessment: no apparent nausea or vomiting Anesthetic complications: no    Last Vitals:  Vitals:   08/24/18 1007 08/24/18 1022  BP: (!) 151/74 139/61  Pulse: 92 88  Resp: 16 11  Temp:  36.5 C  SpO2: 97% 98%    Last Pain:  Vitals:   08/24/18 1022  TempSrc:   PainSc: 2                  Rockie Vawter,W. EDMOND

## 2018-08-24 NOTE — Transfer of Care (Signed)
Immediate Anesthesia Transfer of Care Note  Patient: [name]  Procedure(s) Performed: OPEN REDUCTION INTERNAL FIXATION TRIMALLEOLAR ANKLE FRACTURE (Right )  Patient Location: PACU  Anesthesia Type:General and Regional  Level of Consciousness: awake, alert , oriented and sedated  Airway & Oxygen Therapy: Patient Spontanous Breathing and Patient connected to nasal cannula oxygen  Post-op Assessment: Report given to RN, Post -op Vital signs reviewed and stable and Patient moving all extremities  Post vital signs: Reviewed and stable  Last Vitals:  Vitals Value Taken Time  BP 160/80 08/24/18 0955  Temp    Pulse 103 08/24/18 0957  Resp 20 08/24/18 0957  SpO2 95 % 08/24/18 0957  Vitals shown include unvalidated device data.  Last Pain:  Vitals:   08/24/18 0623  TempSrc: Oral  PainSc: 3       Patients Stated Pain Goal: 3 (10/62/69 4854)  Complications: No apparent anesthesia complications

## 2018-08-24 NOTE — Anesthesia Procedure Notes (Signed)
Procedure Name: LMA Insertion Date/Time: 08/24/2018 8:11 AM Performed by: Scheryl Darter, CRNA Pre-anesthesia Checklist: Patient identified, Emergency Drugs available, Suction available and Patient being monitored Patient Re-evaluated:Patient Re-evaluated prior to induction Oxygen Delivery Method: Circle System Utilized Preoxygenation: Pre-oxygenation with 100% oxygen Induction Type: IV induction Ventilation: Mask ventilation without difficulty LMA: LMA inserted LMA Size: 4.0 Number of attempts: 1 Airway Equipment and Method: Bite block Placement Confirmation: positive ETCO2 Tube secured with: Tape Dental Injury: Teeth and Oropharynx as per pre-operative assessment

## 2018-08-24 NOTE — Anesthesia Procedure Notes (Signed)
Anesthesia Regional Block: Popliteal block   Pre-Anesthetic Checklist: ,, timeout performed, Correct Patient, Correct Site, Correct Laterality, Correct Procedure, Correct Position, site marked, Risks and benefits discussed, pre-op evaluation,  At surgeon's request and post-op pain management  Laterality: Right  Prep: Maximum Sterile Barrier Precautions used, chloraprep       Needles:  Injection technique: Single-shot  Needle Type: Echogenic Stimulator Needle     Needle Length: 9cm  Needle Gauge: 21     Additional Needles:   Procedures:, nerve stimulator,,, ultrasound used (permanent image in chart),,,,   Nerve Stimulator or Paresthesia:  Response: Peroneal,  Response: Tibial,   Additional Responses:   Narrative:  Start time: 08/24/2018 7:14 AM End time: 08/24/2018 7:24 AM Injection made incrementally with aspirations every 5 mL. Anesthesiologist: Roderic Palau, MD  Additional Notes: 2% Lidocaine skin wheel. Saphenous block with 10cc of 0.5% Bupivicaine plain.

## 2018-08-24 NOTE — Progress Notes (Signed)
Patient's HCG serum hemolyzed.  Will collect urine in OR

## 2018-08-24 NOTE — Interval H&P Note (Signed)
History and Physical Interval Note:  08/24/2018 7:27 AM  [name]  has presented today for surgery, with the diagnosis of right trimalleolar ankle fracture.  The various methods of treatment have been discussed with the patient and family. After consideration of risks, benefits and other options for treatment, the patient has consented to  Procedure(s): OPEN REDUCTION INTERNAL FIXATION TRIMALLEOLAR ANKLE FRACTURE (Right) as a surgical intervention.  The patient's history has been reviewed, patient examined, no change in status, stable for surgery.  I have reviewed the patient's chart and labs.  Questions were answered to the patient's satisfaction.     [name]
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
## 2018-08-24 NOTE — Op Note (Signed)
Preop diagnosis: Right trimalleolar ankle fracture, closed.  Postop diagnosis: Same  Procedure: Open reduction internal fixation trimalleolar fixation of closed trimalleolar right ankle fracture.  Surgeon: Rodell Perna, MD  Anesthesia: Preoperative block plus general.  Tourniquet: 350 x 57 minutes  Implants: Biomet titanium composite locking plate 8 hole.  Cannulated screws for medial malleolus and posterior malleolus.  Brief history 38 year old female tripped and fell suffering a ankle trimalleolar fracture dislocation.  She was reduced in the ER for her unstable fracture and placed in a splint.  She presents the operating room for stabilization.  Procedure: After standard prepping and draping preoperative antibiotics timeout procedure proximal thigh tourniquet DuraPrep extremity sheets drapes sterile glove over the toes were applied.  Timeout procedure was completed.  Leg was wrapped in Esmarch tourniquet inflated.  Medial incision C-shaped incision anteriorly was performed first medial malleolus was distracted irrigated hematoma was removed there was nothing of preventing reduction.  Lateral incision was made over the fibula there was comminution of the fibula with a posterior butterfly fragment 8 hole plate was selected.  Initially distal locking screws were placed but the plate slipped in the proximal portion of the plate was anterior to the cortex.  Midportion screws were removed plate was repositioned and held with a lobster claw clamps.  Proximal hole was filled and then the remaining screws were filled combination of locking nonlocking screws.  Interfrag lag screw was placed from anterior proximal distal posteriorly 22 mm and a second interfrag screw was placed above to catch the posterior butterfly fragment and separate forward to reduce the fracture.  Spot images were taken with excellent position.  Next small incision was made anteriorly and blunt dissection was performed down to the  periosteum.  Guide was placed to protect the soft tissue and a K wire was drilled close to parallel to the joint catching the posterior malleolar fragment measuring and then drilling the proximal cortex and leg in the posterior fragment.  Spot final pictures AP lateral mortise x-rays were taken good position alignment was visualized.  Medial side was fixed with 2 malleolar screws 40 mm partially-threaded cancellus after fracture was reduced anatomically.  Irrigation with saline solution reapproximation subcutaneous tissue with 2-0 Vicryl skin staple closure Xeroform 4 x 4's web roll ABDs and short leg splint was applied followed by more web roll and Ace wrap.  Patient tolerated procedure well transferred to care room in stable condition.

## 2018-08-24 NOTE — Progress Notes (Signed)
Attempted to reach pharmacy for med rec.  Number busy.

## 2018-08-25 ENCOUNTER — Encounter (HOSPITAL_COMMUNITY): Payer: Self-pay | Admitting: Orthopaedic Surgery

## 2018-08-30 ENCOUNTER — Other Ambulatory Visit: Payer: Self-pay | Admitting: Orthopaedic Surgery

## 2018-08-30 ENCOUNTER — Telehealth: Payer: Self-pay | Admitting: Orthopaedic Surgery

## 2018-08-30 MED ORDER — OXYCODONE-ACETAMINOPHEN 5-325 MG PO TABS: 1.0000 | ORAL_TABLET | Freq: Three times a day (TID) | ORAL | 0 refills | Status: DC | PRN

## 2018-08-30 NOTE — Telephone Encounter (Signed)
Patient called and stated she needed to know if she can take off cast to bathe and to air it out.  Please call patient to advise.  [phone]

## 2018-08-30 NOTE — Progress Notes (Signed)
Percocet 5/325   # 30 sent  In . Using walker/crutches and W/C and is NWB. Dressing change on ROV to see Benjiman Core PA-C and then re application of spint and ROV the following week for staples out and [facility] NWB.

## 2018-08-30 NOTE — Telephone Encounter (Signed)
I called patient to advise dressing should remain intact until first post op visit in the office and that she should not remove anything. Voice mailbox was full and I could not leave message. Will try again later.

## 2018-08-30 NOTE — Telephone Encounter (Signed)
Please advise on rx refill request

## 2018-08-30 NOTE — Telephone Encounter (Signed)
Pt aware.

## 2018-08-30 NOTE — Telephone Encounter (Signed)
IC patient and advised her of your message.  She also wanted to know if she needed to wrap her cast in a plastic bag to take a bath/shower.  Patient is also requesting a RX refill on her Oxycodone.  CB#[phone].  Thank you.

## 2018-09-01 ENCOUNTER — Ambulatory Visit (INDEPENDENT_AMBULATORY_CARE_PROVIDER_SITE_OTHER): Payer: Self-pay | Admitting: Surgery

## 2018-09-01 ENCOUNTER — Other Ambulatory Visit: Payer: Self-pay

## 2018-09-01 ENCOUNTER — Encounter: Payer: Self-pay | Admitting: Surgery

## 2018-09-01 ENCOUNTER — Ambulatory Visit (HOSPITAL_COMMUNITY)
Admission: RE | Admit: 2018-09-01 | Discharge: 2018-09-01 | Disposition: A | Discharge status: Home / Self Care | Payer: Self-pay | Source: Ambulatory Visit | Attending: Surgery | Admitting: Surgery

## 2018-09-01 VITALS — Ht 68.0 in | Wt 225.0 lb

## 2018-09-01 DIAGNOSIS — M79661 Pain in right lower leg: Secondary | ICD-10-CM | POA: Insufficient documentation

## 2018-09-01 NOTE — Progress Notes (Signed)
Right lower extremity venous duplex completed. Preliminary results in Chart review CV Proc. [place] Ekta Dancer,RVS 09/01/2018, 5:16 PM
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
## 2018-09-06 ENCOUNTER — Telehealth: Payer: Self-pay | Admitting: Orthopaedic Surgery

## 2018-09-06 MED ORDER — TRAMADOL HCL 50 MG PO TABS: ORAL_TABLET | ORAL | 0 refills | Status: DC

## 2018-09-06 NOTE — Telephone Encounter (Signed)
Already had 70 tabs percocet.     Call in ultram one po tid prn pain # 30 .   I called and discussed. [facility] Conservation officer, [place].

## 2018-09-06 NOTE — Telephone Encounter (Signed)
Patient left a message to let you know that the swelling has gone down and that she is needing to know if her cast needs to be rewrapped and also she is in some pain and would like for Dr. Lorin Mercy to call in some pain medication for her.  CB#[phone].  Thank you.

## 2018-09-06 NOTE — Telephone Encounter (Signed)
Please advise. Thanks.  

## 2018-09-06 NOTE — Telephone Encounter (Signed)
Called to pharmacy 

## 2018-09-13 ENCOUNTER — Ambulatory Visit: Payer: Self-pay | Admitting: Orthopaedic Surgery

## 2018-09-16 ENCOUNTER — Ambulatory Visit (INDEPENDENT_AMBULATORY_CARE_PROVIDER_SITE_OTHER): Payer: Self-pay

## 2018-09-16 ENCOUNTER — Ambulatory Visit (INDEPENDENT_AMBULATORY_CARE_PROVIDER_SITE_OTHER): Payer: Self-pay | Admitting: Orthopaedic Surgery

## 2018-09-16 ENCOUNTER — Encounter: Payer: Self-pay | Admitting: Orthopaedic Surgery

## 2018-09-16 VITALS — Ht 68.0 in | Wt 225.0 lb

## 2018-09-16 DIAGNOSIS — S82851A Displaced trimalleolar fracture of right lower leg, initial encounter for closed fracture: Secondary | ICD-10-CM

## 2018-09-16 MED ORDER — TRAMADOL HCL 50 MG PO TABS: 50.0000 mg | ORAL_TABLET | Freq: Two times a day (BID) | ORAL | 0 refills | Status: DC | PRN

## 2018-09-16 NOTE — Progress Notes (Signed)
Postop trimalleolar ankle fracture fixation right ankle from 08/24/2018.  Staples are harvested today Steri-Strips applied.  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  She can remove the cam boot to work on ankle range of motion exercises and then put her cam boot back on.  She will still be nonweightbearing.  Return 1 month repeat x-rays on return.  Today's x-ray results were reviewed with the patient which shows good position and alignment.

## 2018-10-18 ENCOUNTER — Ambulatory Visit (INDEPENDENT_AMBULATORY_CARE_PROVIDER_SITE_OTHER): Payer: Self-pay | Admitting: Orthopaedic Surgery

## 2018-10-18 ENCOUNTER — Ambulatory Visit (INDEPENDENT_AMBULATORY_CARE_PROVIDER_SITE_OTHER): Payer: Self-pay

## 2018-10-18 ENCOUNTER — Encounter: Payer: Self-pay | Admitting: Orthopaedic Surgery

## 2018-10-18 VITALS — BP 127/78 | HR 95 | Ht 68.0 in | Wt 225.0 lb

## 2018-10-18 DIAGNOSIS — S82851A Displaced trimalleolar fracture of right lower leg, initial encounter for closed fracture: Secondary | ICD-10-CM
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
## 2018-11-09 ENCOUNTER — Telehealth: Payer: Self-pay

## 2018-11-09 NOTE — Telephone Encounter (Signed)
Patient called stating that she is having pain in her right ankle.  States that right ankle hurts all the time and swells.  Advised patient that Dr. Lorin Mercy is out of the office and his PA Benjiman Core can see her. Patient preferred to see another provider.  Patient has appointment to see Dr. Erlinda Hong on Thursday, 11/10/2018.

## 2018-11-10 ENCOUNTER — Ambulatory Visit: Payer: Self-pay | Admitting: Orthopaedic Surgery

## 2018-11-10 ENCOUNTER — Encounter: Payer: Self-pay | Admitting: Orthopaedic Surgery

## 2018-11-10 ENCOUNTER — Ambulatory Visit (INDEPENDENT_AMBULATORY_CARE_PROVIDER_SITE_OTHER): Payer: Self-pay | Admitting: Orthopaedic Surgery

## 2018-11-10 ENCOUNTER — Ambulatory Visit (INDEPENDENT_AMBULATORY_CARE_PROVIDER_SITE_OTHER): Payer: Self-pay

## 2018-11-10 DIAGNOSIS — S82851A Displaced trimalleolar fracture of right lower leg, initial encounter for closed fracture: Secondary | ICD-10-CM
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
## 2018-11-11 ENCOUNTER — Telehealth: Payer: Self-pay | Admitting: Orthopaedic Surgery

## 2018-11-11 NOTE — Telephone Encounter (Signed)
Jarrett Soho from [facility] called. She would like a referral, post op notes, and protocol faxed to [phone]. Her call back number is [phone]

## 2018-11-14 ENCOUNTER — Other Ambulatory Visit: Payer: Self-pay

## 2018-11-14 ENCOUNTER — Emergency Department (HOSPITAL_COMMUNITY)
Admission: EM | Admit: 2018-11-14 | Discharge: 2018-11-18 | Disposition: A | Discharge status: Other Acute Inpatient Hospital | Payer: Self-pay | Attending: Emergency Medicine | Admitting: Emergency Medicine

## 2018-11-14 ENCOUNTER — Telehealth: Payer: Self-pay | Admitting: Orthopaedic Surgery

## 2018-11-14 ENCOUNTER — Encounter (HOSPITAL_COMMUNITY): Payer: Self-pay | Admitting: Emergency Medicine

## 2018-11-14 DIAGNOSIS — F302 Manic episode, severe with psychotic symptoms: Secondary | ICD-10-CM | POA: Diagnosis present

## 2018-11-14 DIAGNOSIS — Z20828 Contact with and (suspected) exposure to other viral communicable diseases: Secondary | ICD-10-CM | POA: Insufficient documentation

## 2018-11-14 DIAGNOSIS — F411 Generalized anxiety disorder: Secondary | ICD-10-CM | POA: Diagnosis present

## 2018-11-14 DIAGNOSIS — Z008 Encounter for other general examination: Secondary | ICD-10-CM

## 2018-11-14 DIAGNOSIS — J45909 Unspecified asthma, uncomplicated: Secondary | ICD-10-CM | POA: Insufficient documentation

## 2018-11-14 DIAGNOSIS — F17210 Nicotine dependence, cigarettes, uncomplicated: Secondary | ICD-10-CM | POA: Insufficient documentation

## 2018-11-14 DIAGNOSIS — F319 Bipolar disorder, unspecified: Secondary | ICD-10-CM | POA: Insufficient documentation

## 2018-11-14 DIAGNOSIS — F900 Attention-deficit hyperactivity disorder, predominantly inattentive type: Secondary | ICD-10-CM | POA: Diagnosis present

## 2018-11-14 DIAGNOSIS — Z79899 Other long term (current) drug therapy: Secondary | ICD-10-CM | POA: Insufficient documentation

## 2018-11-14 DIAGNOSIS — F32A Depression, unspecified: Secondary | ICD-10-CM | POA: Diagnosis present

## 2018-11-14 DIAGNOSIS — F329 Major depressive disorder, single episode, unspecified: Secondary | ICD-10-CM | POA: Diagnosis present

## 2018-11-14 DIAGNOSIS — F988 Other specified behavioral and emotional disorders with onset usually occurring in childhood and adolescence: Secondary | ICD-10-CM | POA: Diagnosis present

## 2018-11-14 LAB — COMPREHENSIVE METABOLIC PANEL
ALT: 19 U/L (ref 0–44)
AST: 19 U/L (ref 15–41)
Albumin: 4.3 g/dL (ref 3.5–5.0)
Alkaline Phosphatase: 128 U/L — ABNORMAL HIGH (ref 38–126)
Anion gap: 10 (ref 5–15)
BUN: 12 mg/dL (ref 6–20)
CO2: 23 mmol/L (ref 22–32)
Calcium: 9.2 mg/dL (ref 8.9–10.3)
Chloride: 105 mmol/L (ref 98–111)
Creatinine, Ser: 0.87 mg/dL (ref 0.44–1.00)
GFR calc Af Amer: >60 mL/min (ref >60)
GFR calc non Af Amer: >60 mL/min (ref >60)
Glucose, Bld: 92 mg/dL (ref 70–99)
Potassium: 3.5 mmol/L (ref 3.5–5.1)
Sodium: 138 mmol/L (ref 135–145)
Total Bilirubin: 0.1 mg/dL — ABNORMAL LOW (ref 0.3–1.2)
Total Protein: 7 g/dL (ref 6.5–8.1)

## 2018-11-14 LAB — I-STAT BETA HCG BLOOD, ED (MC, WL, AP ONLY): I-stat hCG, quantitative: <5 m[IU]/mL (ref <5)

## 2018-11-14 LAB — CBC
HCT: 41.8 % (ref 36.0–46.0)
Hemoglobin: 13 g/dL (ref 12.0–15.0)
MCH: 30 pg (ref 26.0–34.0)
MCHC: 31.1 g/dL (ref 30.0–36.0)
MCV: 96.3 fL (ref 80.0–100.0)
Platelets: 291 10*3/uL (ref 150–400)
RBC: 4.34 MIL/uL (ref 3.87–5.11)
RDW: 13.3 % (ref 11.5–15.5)
WBC: 11.4 10*3/uL — ABNORMAL HIGH (ref 4.0–10.5)
nRBC: 0 % (ref 0.0–0.2)

## 2018-11-14 LAB — RAPID URINE DRUG SCREEN, HOSP PERFORMED
Amphetamines: NOT DETECTED
Barbiturates: NOT DETECTED
Benzodiazepines: NOT DETECTED
Cocaine: NOT DETECTED
Opiates: NOT DETECTED
Tetrahydrocannabinol: NOT DETECTED

## 2018-11-14 LAB — ETHANOL: Alcohol, Ethyl (B): <10 mg/dL (ref <10)

## 2018-11-14 LAB — SALICYLATE LEVEL: Salicylate Lvl: <7 mg/dL (ref 2.8–30.0)

## 2018-11-14 LAB — ACETAMINOPHEN LEVEL: Acetaminophen (Tylenol), Serum: <10 ug/mL — ABNORMAL LOW (ref 10–30)

## 2018-11-14 NOTE — Telephone Encounter (Signed)
Received call from Jarrett Soho (PT) with [facility] needing Rx for PT, Pre-op, post-op notes and Protocol for PT. Note: patient has an appointment today at 3:00pm  The fax# is [phone]  Ph# is [phone]

## 2018-11-14 NOTE — ED Triage Notes (Signed)
Patient states that she does not want to hurt herself. Patient states she thinks her mom just loves sending her here. Patient is IVC'd. Patient is not eating, sleeping, or tending to hygiene. Patient is currently manic per IVC paper work. Patient has been showing up to peoples houses at 3 am. Patient is hearing and seeing things.

## 2018-11-14 NOTE — Telephone Encounter (Signed)
Looks like this has been done by [employer]. Duplicate message.

## 2018-11-14 NOTE — ED Notes (Signed)
When Cortni, PA was assessing patient, provided a cup of water. Kady, NT has assisted patient going to the restroom with a wheelchair due to recent surgery on her right ankle. Patient remains calm and cooperative. Noticed patient intermittently tears up.

## 2018-11-14 NOTE — Telephone Encounter (Signed)
Faxed to [employer]
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
## 2018-11-15 ENCOUNTER — Emergency Department (HOSPITAL_COMMUNITY): Payer: Self-pay

## 2018-11-15 LAB — SARS CORONAVIRUS 2 BY RT PCR (HOSPITAL ORDER, PERFORMED IN ~~LOC~~ HOSPITAL LAB): SARS Coronavirus 2: NEGATIVE

## 2018-11-15 LAB — D-DIMER, QUANTITATIVE: D-Dimer, Quant: 0.29 ug/mL-FEU (ref 0.00–0.50)

## 2018-11-15 MED ORDER — RISPERIDONE 2 MG PO TABS
6.0000 mg | ORAL_TABLET | Freq: Every day | ORAL | Status: DC
  Administered 2018-11-17: 6 mg via ORAL
  Filled 2018-11-15 (×3): qty 3

## 2018-11-15 MED ORDER — ACETAMINOPHEN 325 MG PO TABS
650.0000 mg | ORAL_TABLET | Freq: Four times a day (QID) | ORAL | Status: DC | PRN
  Administered 2018-11-15 – 2018-11-18 (×11): 650 mg via ORAL
  Filled 2018-11-15 (×13): qty 2

## 2018-11-15 MED ORDER — ALBUTEROL SULFATE HFA 108 (90 BASE) MCG/ACT IN AERS: 2.0000 | INHALATION_SPRAY | Freq: Four times a day (QID) | RESPIRATORY_TRACT | Status: DC | PRN

## 2018-11-15 MED ORDER — HYDROXYZINE HCL 25 MG PO TABS
25.0000 mg | ORAL_TABLET | Freq: Three times a day (TID) | ORAL | Status: DC | PRN
  Administered 2018-11-15 – 2018-11-18 (×2): 25 mg via ORAL
  Filled 2018-11-15 (×2): qty 1

## 2018-11-15 MED ORDER — BENZTROPINE MESYLATE 0.5 MG PO TABS
0.5000 mg | ORAL_TABLET | Freq: Two times a day (BID) | ORAL | Status: DC
  Administered 2018-11-15 – 2018-11-18 (×5): 0.5 mg via ORAL
  Filled 2018-11-15 (×7): qty 1

## 2018-11-15 NOTE — BH Assessment (Addendum)
Per Hampton Abbot, MD, this patient requires psychiatric hospitalization at this time. Counselor referred patient to the following hospitals for consideration. Pending review:  [facility] (received a call stating they were at capacity) [facility]  [facility] (received a call stating they were at capacity) [facility]

## 2018-11-15 NOTE — BH Assessment (Signed)
[facility] Assessment Progress Note  Per Hampton Abbot, MD, this patient requires psychiatric hospitalization at this time.  Pt presents under IVC initiated by pt's mother and upheld by Dr Dwyane Dee.  Placement will be sought for pt.  Jalene Mullet, [place] Coordinator [phone]

## 2018-11-15 NOTE — BH Assessment (Addendum)
Tele Assessment Note   Patient Name: SAKURA DENIS MRN: [id number] Referring Physician: Dr. Frederick Peers, MD Location of Patient: [facility] Location of Provider: Behavioral Health TTS Department  DVORA BUITRON is a 38 y.o. female who was brought in to [facility] via police under an IVC order that was completed by her mother. Pt shares she was told she has MH issues and that her mother is concerned about her, though she disagrees about this and is unable to identify what mental health issues her mother might be referring to. Pt had surgery on her L ankle months ago and she states it continues to bother her. Pt states she has a poor died and that she sleeps "sporatically" throughout the day and night. Pt denies SI; she states that, in her 85s, she had thoughts of killing herself at times but never attempted to kill herself. At this point, pt wonders if she needs to have her attorney present to participate in the assessment. Clinician explained that this assessment is being used to help determined how to best help pt and get her any services she might need. Pt requested to use the restroom and, upon returning, she stated she was not going to answer any more questions without her attorney present.  Pt was oriented x3; she was unsure of the date, stating she thought it was October but she wasn't sure if it was 2020. Pt's memory was UTA. Pt was polite through the part of the assessment that was completed. Pt's insight, judgement, and impulse control is impaired at this time.   Diagnosis: F31.9, Bipolar I disorder, Current or most recent episode unspecified   Past Medical History:  Past Medical History:  Diagnosis Date  . ADD 10/22/2009  . ALLERGIC RHINITIS 09/26/2008  . Anemia   . ANXIETY 09/26/2008  . Asthma   . Complication of anesthesia    "tempermental" per patient, "aggiated and hostile"  . CONSTIPATION 09/26/2008  . DEPRESSION 09/26/2008  . FATIGUE 01/14/2009  . Head injury with loss of  consciousness (HCC)   . Headache   . HYPERLIPIDEMIA 09/26/2008  . IBS (irritable bowel syndrome)   . OTITIS MEDIA, LEFT 01/14/2009  . SKIN LESION 09/26/2008  . Sleep apnea   . TONSILLITIS, ACUTE 01/14/2009  . Unconscious state (HCC) 10/13/2017  . URI 10/22/2009    Past Surgical History:  Procedure Laterality Date  . CERVICAL POLYPECTOMY  2009  . CERVICAL POLYPECTOMY  2019  . DILATATION & CURETTAGE/HYSTEROSCOPY WITH MYOSURE N/A 11/17/2017   Procedure: DILATATION & CURETTAGE/HYSTEROSCOPY WITH MYOSURE;  Surgeon: Geryl Rankins, MD;  Location: [facility] ORS;  Service: Gynecology;  Laterality: N/A;  . ORIF ANKLE FRACTURE Right 08/24/2018   Procedure: OPEN REDUCTION INTERNAL FIXATION TRIMALLEOLAR ANKLE FRACTURE;  Surgeon: Eldred Manges, MD;  Location: [facility] OR;  Service: Orthopedics;  Laterality: Right;    Family History:  Family History  Problem Relation Age of Onset  . Diabetes Other   . Hypertension Other   . Thyroid disease Mother   . Hypertension Mother     Social History:  reports that she has been smoking cigarettes. She has a 20.00 pack-year smoking history. She has never used smokeless tobacco. She reports previous alcohol use. She reports that she does not use drugs.  Additional Social History:  Alcohol / Drug Use Pain Medications: Please see MAR Prescriptions: Please see MAR Over the Counter: Please see MAR History of alcohol / drug use?: (UTA) Longest period of sobriety (when/how long): UTA  CIWA:  CIWA-Ar BP: (!) 150/92 Pulse Rate: (!) 102 COWS:    Allergies: No Known Allergies  Home Medications: (Not in a hospital admission)   OB/GYN Status:  No LMP recorded (lmp unknown).  General Assessment Data Location of Assessment: [facility] ED TTS Assessment: In system Is this a Tele or Face-to-Face Assessment?: Tele Assessment Is this an Initial Assessment or a Re-assessment for this encounter?: Initial Assessment Patient Accompanied by:: N/A Language Other than English: No Living  Arrangements: (UTA) What gender do you identify as?: Female Marital status: Other (comment)(UTA) Maiden name: UTA Pregnancy Status: Unable to assess Living Arrangements: Other (Comment)(UTA) Can pt return to current living arrangement?: (UTA) Admission Status: Involuntary Petitioner: Family member Is patient capable of signing voluntary admission?: Yes Referral Source: Self/Family/Friend Insurance type: None     Crisis Care Plan Living Arrangements: Other (Comment)(UTA) Legal Guardian: Other:(Self) Name of Psychiatrist: UTA Name of Therapist: UTA  Education Status Is patient currently in school?: (UTA)  Risk to self with the past 6 months Suicidal Ideation: No Has patient been a risk to self within the past 6 months prior to admission? : No Suicidal Intent: No Has patient had any suicidal intent within the past 6 months prior to admission? : No Is patient at risk for suicide?: No Suicidal Plan?: No Has patient had any suicidal plan within the past 6 months prior to admission? : No Access to Means: No What has been your use of drugs/alcohol within the last 12 months?: UTA Previous Attempts/Gestures: No How many times?: 0 Other Self Harm Risks: Pt states her diet has been poor as of late Triggers for Past Attempts: None known Intentional Self Injurious Behavior: (UTA) Family Suicide History: Unable to assess Recent stressful life event(s): Other (Comment)(Pt had surgery on her ankle and it is still sore/problematic) Persecutory voices/beliefs?: (UTA) Depression: Yes Depression Symptoms: Despondent, Insomnia, Fatigue, Feeling worthless/self pity Substance abuse history and/or treatment for substance abuse?: (UTA) Suicide prevention information given to non-admitted patients: Not applicable  Risk to Others within the past 6 months Homicidal Ideation: (UTA) Does patient have any lifetime risk of violence toward others beyond the six months prior to admission? :  Unknown Thoughts of Harm to Others: (UTA) Current Homicidal Intent: (UTA) Current Homicidal Plan: (UTA) Access to Homicidal Means: (UTA) Identified Victim: UTA History of harm to others?: (UTA) Assessment of Violence: On admission Violent Behavior Description: UTA Does patient have access to weapons?: (UTA) Criminal Charges Pending?: (UTA) Does patient have a court date: (UTA) Is patient on probation?: Unknown  Psychosis Hallucinations: (UTA) Delusions: (UTA)  Mental Status Report Appearance/Hygiene: Bizarre Eye Contact: Fair Motor Activity: Shuffling Speech: Soft Level of Consciousness: Quiet/awake Mood: Suspicious Affect: Blunted Anxiety Level: Minimal Thought Processes: Circumstantial Judgement: Impaired Orientation: Person, Situation Obsessive Compulsive Thoughts/Behaviors: Minimal  Cognitive Functioning Concentration: Fair Memory: Unable to Assess Insight: Fair Impulse Control: Poor Appetite: Fair Have you had any weight changes? : No Change Sleep: Decreased Total Hours of Sleep: (UTA) Vegetative Symptoms: Staying in bed  ADLScreening [facility] Assessment Services) Patient's cognitive ability adequate to safely complete daily activities?: (UTA) Patient able to express need for assistance with ADLs?: (UTA) Independently performs ADLs?: (UTA)  Prior Inpatient Therapy Prior Inpatient Therapy: (UTA)  Prior Outpatient Therapy Prior Outpatient Therapy: (UTA)  ADL Screening (condition at time of admission) Patient's cognitive ability adequate to safely complete daily activities?: (UTA) Is the patient deaf or have difficulty hearing?: (UTA) Does the patient have difficulty seeing, even when wearing glasses/contacts?: (UTA) Does the patient have difficulty  concentrating, remembering, or making decisions?: (UTA) Patient able to express need for assistance with ADLs?: (UTA) Does the patient have difficulty dressing or bathing?: (UTA) Independently performs ADLs?:  (UTA) Does the patient have difficulty walking or climbing stairs?: (UTA) Weakness of Legs: (UTA) Weakness of Arms/Hands: (UTA)  Home Assistive Devices/Equipment Home Assistive Devices/Equipment: (UTA)  Therapy Consults (therapy consults require a physician order) PT Evaluation Needed: (UTA) OT Evalulation Needed: (UTA) SLP Evaluation Needed: (UTA) Abuse/Neglect Assessment (Assessment to be complete while patient is alone) Abuse/Neglect Assessment Can Be Completed: Unable to assess, patient is non-responsive or altered mental status Values / Beliefs Cultural Requests During Hospitalization: (UTA) Spiritual Requests During Hospitalization: (UTA) [place] Needed: (UTA) Social Work Consult Needed: Special educational needs teacher) Regulatory affairs officer ([place]) Does Patient Have a Medical Advance Directive?: Unable to assess, patient is non-responsive or altered mental status Would patient like information on creating a medical advance directive?: No - Patient declined         Disposition: Lindon Romp, NP, reviewed pt's chart and information and determined pt meets criteria for inpatient hospitalization. There are currently no appropriate beds at [facility] so pt's referral information will be faxed out to multiple hospitals for potential placement. This information was provided to pt's nurse, Alleen Borne, at [phone].   Disposition Initial Assessment Completed for this Encounter: Yes Patient referred to: Other (Comment)(Pt's referral information will be provided to [facility])  This service was provided via telemedicine using a 2-way, interactive audio and video technology.  Names of all persons participating in this telemedicine service and their role in this encounter. Name: Shelia Media Role: Patient  Name: Lindon Romp Role: Nurse Practitioner  Name: Windell Hummingbird Role: Clinician    Dannielle Burn 11/15/2018 4:09 AM

## 2018-11-15 NOTE — ED Notes (Signed)
Patient offered showering. Initially declined but then decided to shower at this time. After shower, patient kept coming out of room and walking back and forth down the hallways. Educated patient she cannot be walking freely in the hallways. Patient stated she was just getting stuff out of the bathroom.

## 2018-11-15 NOTE — Progress Notes (Addendum)
Received [name] at the change of shift at the nurses station verbalizing many requests. She was having a difficult time following directions and going into empty rooms. Limits were set with her behavior. She was given snacks per her requests then the requests were terminated. She started singing out loud in the milieu and was told her behavior is inappropriate. She is walking with her foot boot without difficulty. She refused her scheduled medications. Then she pushed the bedside table out in the hallway and attempted to use it to aid her in walking. She started to sing out loud, making loud noises and talking out loud to her self.  She is disturbing the milieu at 0200 hrs. She is constantly being redirected from the distance hallway and other patients room. She drifted off to sleep at 0400 hrs. and slept until 0615 hrs.

## 2018-11-15 NOTE — ED Notes (Signed)
Pt was found walking in hall using a bedside table as a crutch.  Pt was instructed to stop and walk regularly.  She has been walking without assistance all day.

## 2018-11-15 NOTE — ED Notes (Signed)
Patient's boot found in unit bathroom. Tech returned boot to patient. Patient stated she didn't need the boot and was only wearing it to keep her feet from getting dirty. Patient placed the boot in room and began walking around with towels under her feet. Patient ambulated to bathroom stating she wanted to clean her feet.

## 2018-11-16 MED ORDER — STERILE WATER FOR INJECTION IJ SOLN
INTRAMUSCULAR | Status: AC
  Administered 2018-11-16: 03:00:00
  Filled 2018-11-16: qty 10

## 2018-11-16 MED ORDER — ZIPRASIDONE MESYLATE 20 MG IM SOLR
20.0000 mg | Freq: Once | INTRAMUSCULAR | Status: AC
  Administered 2018-11-16: 20 mg via INTRAMUSCULAR
  Filled 2018-11-16: qty 20

## 2018-11-16 NOTE — ED Notes (Signed)
Pt appears to possibly be responding to internal stimuli.   She stayed in the corner of her room for a long time talking and flipping the bird to noone.    Pt is very bizarre and also is walking around with her boot off.  15 minute checks and video monitoring in place.

## 2018-11-16 NOTE — ED Notes (Signed)
Pt was very uncooperative when we went to change rooms.  She did not want to leave that department.  After much convincing we moved her without incident.

## 2018-11-16 NOTE — Consult Note (Addendum)
[facility] ED Progress Note  11/16/2018 12:52 PM [name]  MRN:  [id number]   TTS Assessment Note: reviewed by this provider:  [name] is a 38 y.o. female who was brought in to [facility] via police under an IVC order that was completed by her mother. Pt shares she was told she has MH issues and that her mother is concerned about her, though she disagrees about this and is unable to identify what mental health issues her mother might be referring to. Pt had surgery on her L ankle months ago and she states it continues to bother her. Pt states she has a poor died and that she sleeps "sporatically" throughout the day and night. Pt denies SI; she states that, in her 85s, she had thoughts of killing herself at times but never attempted to kill herself. At this point, pt wonders if she needs to have her attorney present to participate in the assessment. Clinician explained that this assessment is being used to help determined how to best help pt and get her any services she might need. Pt requested to use the restroom and, upon returning, she stated she was not going to answer any more questions without her attorney present. Pt was oriented x3; she was unsure of the date, stating she thought it was October but she wasn't sure if it was 2020. Pt's memory was UTA. Pt was polite through the part of the assessment that was completed. Pt's insight, judgement, and impulse control is impaired at this time.  Subjective:  [name], 38 y.o., female patient seen via tele psych by this provider, Dr. Jama Flavors; and chart reviewed on 11/16/18.  On evaluation [name] replied "I'm surviving" when asked how she was doing.  Patient states that she was brought to the hospital because her mother took papers out on her.  Patient refused to answer most questions stating "I don't wan to answer that without my lawyer present."  Patient denies suicidal/self-harm/homicidal ideation, psychosis, and paranoia.  Nursing  reports that patient has yelling, was given Geodon last night related to behavior.  During the assessment of another patient I did hear patient in hall yelling at nurse and nurse trying to redirect patient.  Patient was trying to come into the room of the patient that was currently being assessed at that time.   During evaluation [name] is alert/oriented x 4; calm/ but guarded, and mood irritable.  She does appear to be responding to internal or external stimuli (staring out into the room) also some delay in speech at times and then will start talking or stating that she did not want to answer or "I don't know"; although she continues to deny suicidal/self-harm/homicidal ideation, psychosis, and paranoia.     Principal Problem: Bipolar 1 disorder (HCC) Diagnosis:  Principal Problem:   Bipolar 1 disorder (HCC) Active Problems:   Anxiety state   Depression   ADD (attention deficit disorder)   Bipolar I disorder, single manic episode, severe, with psychosis (HCC)  Total Time spent with patient: 30 minutes  Past Psychiatric History: See above  Past Medical History:  Past Medical History:  Diagnosis Date  . ADD 10/22/2009  . ALLERGIC RHINITIS 09/26/2008  . Anemia   . ANXIETY 09/26/2008  . Asthma   . Complication of anesthesia    "tempermental" per patient, "aggiated and hostile"  . CONSTIPATION 09/26/2008  . DEPRESSION 09/26/2008  . FATIGUE 01/14/2009  . Head injury with loss of consciousness (  HCC)   . Headache   . HYPERLIPIDEMIA 09/26/2008  . IBS (irritable bowel syndrome)   . OTITIS MEDIA, LEFT 01/14/2009  . SKIN LESION 09/26/2008  . Sleep apnea   . TONSILLITIS, ACUTE 01/14/2009  . Unconscious state (HCC) 10/13/2017  . URI 10/22/2009    Past Surgical History:  Procedure Laterality Date  . CERVICAL POLYPECTOMY  2009  . CERVICAL POLYPECTOMY  2019  . DILATATION & CURETTAGE/HYSTEROSCOPY WITH MYOSURE N/A 11/17/2017   Procedure: DILATATION & CURETTAGE/HYSTEROSCOPY WITH MYOSURE;   Surgeon: Geryl RankinsVarnado, Evelyn, MD;  Location: [facility];  Service: Gynecology;  Laterality: N/A;  . ORIF ANKLE FRACTURE Right 08/24/2018   Procedure: OPEN REDUCTION INTERNAL FIXATION TRIMALLEOLAR ANKLE FRACTURE;  Surgeon: Eldred MangesYates, Mark C, MD;  Location: [facility] OR;  Service: Orthopedics;  Laterality: Right;   Family History:  Family History  Problem Relation Age of Onset  . Diabetes Other   . Hypertension Other   . Thyroid disease Mother   . Hypertension Mother    Family Psychiatric  History: Unaware Social History:  Social History   Substance and Sexual Activity  Alcohol Use Not Currently     Social History   Substance and Sexual Activity  Drug Use No    Social History   Socioeconomic History  . Marital status: Single    Spouse name: Not on file  . Number of children: Not on file  . Years of education: Not on file  . Highest education level: Not on file  Occupational History  . 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 Financial resource strain: Not on file  . Food insecurity    Worry: Not on file    Inability: Not on file  . Transportation needs    Medical: Not on file    Non-medical: Not on file  Tobacco Use  . Smoking status: Current Every Day Smoker    Packs/day: 1.00    Years: 20.00    Pack years: 20.00    Types: Cigarettes  . Smokeless tobacco: Never Used  Substance and Sexual Activity  . Alcohol use: Not Currently  . Drug use: No  . Sexual activity: Not Currently    Birth control/protection: Abstinence  Lifestyle  . Physical activity    Days per week: Not on file    Minutes per session: Not on file  . Stress: Not on file  Relationships  . Social Musicianconnections    Talks on phone: Not on file    Gets together: Not on file    Attends religious service: Not on file    Active member of club or organization: Not on file    Attends meetings of clubs or organizations: Not on file    Relationship status: Not on file  Other Topics Concern  . Not on file  Social  History Narrative   Lives with mother    Sleep: Poor  Appetite:  Fair  Current Medications: Current Facility-Administered Medications  Medication Dose Route Frequency Provider Last Rate Last Dose  . acetaminophen (TYLENOL) tablet 650 mg  650 mg Oral Q6H PRN Arby BarrettePfeiffer, Marcy, MD   650 mg at 11/16/18 1035  . albuterol (VENTOLIN HFA) 108 (90 Base) MCG/ACT inhaler 2 puff  2 puff Inhalation Q6H PRN Arby BarrettePfeiffer, Marcy, MD      . benztropine (COGENTIN) tablet 0.5 mg  0.5 mg Oral BID Arby BarrettePfeiffer, Marcy, MD   0.5 mg at 11/16/18 1035  . hydrOXYzine (ATARAX/VISTARIL) tablet 25 mg  25 mg Oral TID PRN Arby BarrettePfeiffer, Marcy,  MD   25 mg at 11/15/18 0930  . risperiDONE (RISPERDAL) tablet 6 mg  6 mg Oral QHS Arby Barrette, MD       Current Outpatient Medications  Medication Sig Dispense Refill  . amphetamine-dextroamphetamine (ADDERALL) 30 MG tablet Take 30 mg by mouth daily.    Marland Kitchen escitalopram (LEXAPRO) 10 MG tablet Take 10 mg by mouth daily.    Marland Kitchen loratadine (CLARITIN) 10 MG tablet Take 10 mg by mouth daily as needed for allergies.    . traZODone (DESYREL) 100 MG tablet Take 1 tablet (100 mg total) by mouth at bedtime as needed and may repeat dose one time if needed for sleep. 90 tablet 1  . albuterol (PROVENTIL HFA;VENTOLIN HFA) 108 (90 Base) MCG/ACT inhaler Inhale 2 puffs into the lungs every 6 (six) hours as needed for wheezing or shortness of breath. (Patient not taking: Reported on 05/26/2018) 1 Inhaler 2  . ARIPiprazole ER (ABILIFY MAINTENA) 400 MG SRER injection Inject 2 mLs (400 mg total) into the muscle every 28 (twenty-eight) days. Due 5/30 (Patient not taking: Reported on 11/14/2018) 1 each 11  . benztropine (COGENTIN) 0.5 MG tablet Take 1 tablet (0.5 mg total) by mouth 2 (two) times daily. (Patient not taking: Reported on 11/14/2018) 60 tablet 2  . ondansetron (ZOFRAN ODT) 4 MG disintegrating tablet Take 1 tablet (4 mg total) by mouth every 8 (eight) hours as needed for nausea. (Patient not taking: Reported  on 11/14/2018) 10 tablet 0  . oxyCODONE-acetaminophen (PERCOCET) 5-325 MG tablet Take 1-2 tablets by mouth every 6 (six) hours as needed for severe pain. (Patient not taking: Reported on 09/16/2018) 40 tablet 0  . oxyCODONE-acetaminophen (PERCOCET/ROXICET) 5-325 MG tablet Take 1 tablet by mouth every 8 (eight) hours as needed for severe pain. (Patient not taking: Reported on 09/16/2018) 30 tablet 0  . risperiDONE (RISPERDAL) 3 MG tablet Take 2 tablets (6 mg total) by mouth at bedtime. (Patient not taking: Reported on 11/14/2018) 60 tablet 2  . traMADol (ULTRAM) 50 MG tablet 1 po tid prn pain (Patient not taking: Reported on 11/14/2018) 30 tablet 0  . traMADol (ULTRAM) 50 MG tablet Take 1 tablet (50 mg total) by mouth every 12 (twelve) hours as needed. (Patient not taking: Reported on 10/18/2018) 20 tablet 0    Lab Results:  Results for orders placed or performed during the hospital encounter of 11/14/18 (from the past 48 hour(s))  Rapid urine drug screen (hospital performed)     Status: None   Collection Time: 11/14/18  9:07 PM  Result Value Ref Range   Opiates NONE DETECTED NONE DETECTED   Cocaine NONE DETECTED NONE DETECTED   Benzodiazepines NONE DETECTED NONE DETECTED   Amphetamines NONE DETECTED NONE DETECTED   Tetrahydrocannabinol NONE DETECTED NONE DETECTED   Barbiturates NONE DETECTED NONE DETECTED    Comment: (NOTE) DRUG SCREEN FOR MEDICAL PURPOSES ONLY.  IF CONFIRMATION IS NEEDED FOR ANY PURPOSE, NOTIFY LAB WITHIN 5 DAYS. LOWEST DETECTABLE LIMITS FOR URINE DRUG SCREEN Drug Class                     Cutoff (ng/mL) Amphetamine and metabolites    1000 Barbiturate and metabolites    200 Benzodiazepine                 200 Tricyclics and metabolites     300 Opiates and metabolites        300 Cocaine and metabolites        300 THC  50 Performed at [facility], [place] [address], [place], [place] [address]   Comprehensive metabolic panel      Status: Abnormal   Collection Time: 11/14/18  9:31 PM  Result Value Ref Range   Sodium 138 135 - 145 mmol/L   Potassium 3.5 3.5 - 5.1 mmol/L   Chloride 105 98 - 111 mmol/L   CO2 23 22 - 32 mmol/L   Glucose, Bld 92 70 - 99 mg/dL   BUN 12 6 - 20 mg/dL   Creatinine, Ser 0.87 0.44 - 1.00 mg/dL   Calcium 9.2 8.9 - 10.3 mg/dL   Total Protein 7.0 6.5 - 8.1 g/dL   Albumin 4.3 3.5 - 5.0 g/dL   AST 19 15 - 41 U/L   ALT 19 0 - 44 U/L   Alkaline Phosphatase 128 (H) 38 - 126 U/L   Total Bilirubin 0.1 (L) 0.3 - 1.2 mg/dL   GFR calc non Af Amer >60 >60 mL/min   GFR calc Af Amer >60 >60 mL/min   Anion gap 10 5 - 15    Comment: Performed at [facility], [place] [address] [place], [place] [address]  Ethanol     Status: None   Collection Time: 11/14/18  9:31 PM  Result Value Ref Range   Alcohol, Ethyl (B) <10 <10 mg/dL    Comment: (NOTE) Lowest detectable limit for serum alcohol is 10 mg/dL. For medical purposes only. Performed at [facility], [place] [address] [place], [place] [address]   Salicylate level     Status: None   Collection Time: 11/14/18  9:31 PM  Result Value Ref Range   Salicylate Lvl <8.9 2.8 - 30.0 mg/dL    Comment: Performed at [facility], [place] [address] [place], [place] [address]  Acetaminophen level     Status: Abnormal   Collection Time: 11/14/18  9:31 PM  Result Value Ref Range   Acetaminophen (Tylenol), Serum <10 (L) 10 - 30 ug/mL    Comment: (NOTE) Therapeutic concentrations vary significantly. A range of 10-30 ug/mL  may be an effective concentration for many patients. However, some  are best treated at concentrations outside of this range. Acetaminophen concentrations >150 ug/mL at 4 hours after ingestion  and >50 ug/mL at 12 hours after ingestion are often associated with  toxic reactions. Performed at [facility], [place] [address], [place], [place] [address]   cbc     Status:  Abnormal   Collection Time: 11/14/18  9:31 PM  Result Value Ref Range   WBC 11.4 (H) 4.0 - 10.5 K/uL   RBC 4.34 3.87 - 5.11 MIL/uL   Hemoglobin 13.0 12.0 - 15.0 g/dL   HCT 41.8 36.0 - 46.0 %   MCV 96.3 80.0 - 100.0 fL   MCH 30.0 26.0 - 34.0 pg   MCHC 31.1 30.0 - 36.0 g/dL   RDW 13.3 11.5 - 15.5 %   Platelets 291 150 - 400 K/uL   nRBC 0.0 0.0 - 0.2 %    Comment: Performed at [facility], [place] [address] [place],  [address]  D-dimer, quantitative (not at [facility])     Status: None   Collection Time: 11/14/18  9:31 PM  Result Value Ref Range   D-Dimer, Quant 0.29 0.00 - 0.50 ug/mL-FEU    Comment: (NOTE) At the manufacturer cut-off of 0.50 ug/mL FEU, this assay has been documented to exclude PE with a sensitivity and negative predictive value  of 97 to 99%.  At this time, this assay has not been approved by the FDA to exclude DVT/VTE. Results should be correlated with clinical presentation. Performed at [facility], [address] [place], [place] [address]   I-Stat beta hCG blood, ED     Status: None   Collection Time: 11/14/18  9:42 PM  Result Value Ref Range   I-stat hCG, quantitative <5.0 <5 mIU/mL   Comment 3            Comment:   GEST. AGE      CONC.  (mIU/mL)   <=1 WEEK        5 - 50     2 WEEKS       50 - 500     3 WEEKS       100 - 10,000     4 WEEKS     1,000 - 30,000        FEMALE AND NON-PREGNANT FEMALE:     LESS THAN 5 mIU/mL   SARS Coronavirus 2 by RT PCR (hospital order, performed in [facility] hospital lab) Nasopharyngeal Nasopharyngeal Swab     Status: None   Collection Time: 11/15/18  9:08 AM   Specimen: Nasopharyngeal Swab  Result Value Ref Range   SARS Coronavirus 2 NEGATIVE NEGATIVE    Comment: (NOTE) If result is NEGATIVE SARS-CoV-2 target nucleic acids are NOT DETECTED. The SARS-CoV-2 RNA is generally detectable in upper and lower  respiratory specimens during the acute phase of infection. The lowest   concentration of SARS-CoV-2 viral copies this assay can detect is 250  copies / mL. A negative result does not preclude SARS-CoV-2 infection  and should not be used as the sole basis for treatment or other  patient management decisions.  A negative result may occur with  improper specimen collection / handling, submission of specimen other  than nasopharyngeal swab, presence of viral mutation(s) within the  areas targeted by this assay, and inadequate number of viral copies  (<250 copies / mL). A negative result must be combined with clinical  observations, patient history, and epidemiological information. If result is POSITIVE SARS-CoV-2 target nucleic acids are DETECTED. The SARS-CoV-2 RNA is generally detectable in upper and lower  respiratory specimens dur ing the acute phase of infection.  Positive  results are indicative of active infection with SARS-CoV-2.  Clinical  correlation with patient history and other diagnostic information is  necessary to determine patient infection status.  Positive results do  not rule out bacterial infection or co-infection with other viruses. If result is PRESUMPTIVE POSTIVE SARS-CoV-2 nucleic acids MAY BE PRESENT.   A presumptive positive result was obtained on the submitted specimen  and confirmed on repeat testing.  While 2019 novel coronavirus  (SARS-CoV-2) nucleic acids may be present in the submitted sample  additional confirmatory testing may be necessary for epidemiological  and / or clinical management purposes  to differentiate between  SARS-CoV-2 and other Sarbecovirus currently known to infect humans.  If clinically indicated additional testing with an alternate test  methodology [phone]) is advised. The SARS-CoV-2 RNA is generally  detectable in upper and lower respiratory sp ecimens during the acute  phase of infection. The expected result is Negative. Fact Sheet for Patients:  BoilerBrush.com.cy Fact Sheet  for Healthcare Providers: https://pope.com/ This test is not yet approved or cleared by the [place] FDA and has been authorized for detection and/or diagnosis of SARS-CoV-2 by FDA under an Emergency Use  Authorization (EUA).  This EUA will remain in effect (meaning this test can be used) for the duration of the COVID-19 declaration under Section 564(b)(1) of the Act, 21 U.S.C. section 360bbb-3(b)(1), unless the authorization is terminated or revoked sooner. Performed at [facility], [address], [place], [place] [address]     Blood Alcohol level:  Lab Results  Component Value Date   ETH <10 11/14/2018   ETH <10 05/24/2018    Physical Findings: AIMS:  , ,  ,  ,    CIWA:    COWS:     Musculoskeletal: Strength & Muscle Tone: within normal limits Gait & Station: normal Patient leans: N/A  Psychiatric Specialty Exam: Physical Exam  Nursing note and vitals reviewed. Constitutional: She is oriented to person, place, and time. She appears well-nourished.  Respiratory: Effort normal.  Musculoskeletal: Normal range of motion.  Neurological: She is alert and oriented to person, place, and time.  Psychiatric: Her mood appears anxious. She is actively hallucinating. Thought content is paranoid.    Review of Systems  Psychiatric/Behavioral: Positive for hallucinations. The patient is nervous/anxious and has insomnia.   All other systems reviewed and are negative.   Blood pressure 121/71, pulse (!) 118, temperature 97.8 F (36.6 C), temperature source Oral, resp. rate 18, height 5\' 9"  (1.753 m), weight 102.1 kg, SpO2 99 %.Body mass index is 33.23 kg/m.  General Appearance: Casual  Eye Contact:  Fair  Speech:  Pressured  Volume:  Normal  Mood:  Angry, Anxious and Irritable  Affect:  Labile  Thought Process:  Disorganized  Orientation:  Full (Time, Place, and Person)  Thought Content:  Patient denies hallucinations, delusions,  and paranoia.  But appears to be responding to stimuli  Suicidal Thoughts:  Denies  Homicidal Thoughts:  Denies  Memory:  Unable to assess related to patient refusing to answer most questions  Judgement:  Impaired  Insight:  Lacking  Psychomotor Activity:  Normal  Concentration:  Concentration: Fair and Attention Span: Fair  Recall:  Unable to assess related to patient refusing to answer most questions  Fund of Knowledge:  Fair  Language:  Fair  Akathisia:  No  Handed:  Right  AIMS (if indicated):     Assets:  Social Support  ADL's:  Intact  Cognition:  WNL  Sleep:       Treatment Plan Summary: Daily contact with patient to assess and evaluate symptoms and progress in treatment, Medication management and Plan Continue to look for inpatient psychiatric placement  Recommendation/Disposition:  Continue current medications; no changes at this time.  Continue to seek placement for inpatient psychiatric treatment  Shuvon Rankin, NP 11/16/2018, 12:52 PM   Attest to NP Note

## 2018-11-16 NOTE — BH Assessment (Signed)
Per Earleen Newport, NP this patient continues to meet psychiatric hospitalizationat this time. Counselor re-faxed referral with updated clinicals/nurse's notes/labs to the following hospitals for consideration. Pending review:  [facility] (received a call stating they were at capacity) [facility] (received a call stating they were at capacity) [facility]

## 2018-11-16 NOTE — ED Provider Notes (Signed)
Informed by nursing that patient is actively manic.  Refusing to take her medication.  Disturbing other patients.  I have reviewed her chart.  She only has oral medications ordered.  She has not received any IM medications at this time.  Her EKG on 9/19 without any QT prolongation.  Geodon IM ordered.   Merryl Hacker, MD 11/16/18 [phone]

## 2018-11-16 NOTE — ED Notes (Signed)
Family brought a bag of clothes for [name] and they are in locker 72.

## 2018-11-17 ENCOUNTER — Encounter (HOSPITAL_COMMUNITY): Payer: Self-pay | Admitting: Registered Nurse

## 2018-11-17 DIAGNOSIS — F312 Bipolar disorder, current episode manic severe with psychotic features: Secondary | ICD-10-CM

## 2018-11-17 NOTE — ED Notes (Signed)
Pt paranoid, making sure not medication in water.

## 2018-11-17 NOTE — ED Notes (Signed)
Pt restless, walking around room, focused on bathroom.

## 2018-11-17 NOTE — ED Notes (Signed)
Irritable. Restless. Pt asked for and was set up for shower and then refused the shower. Pt irritable and said she did not feel comfortable.

## 2018-11-17 NOTE — ED Notes (Signed)
Took HS meds as ordered. She did request information on it, and first wanted to pass on tonights dose and talk with the Dr tomorrow and then consider taking it. She states she doesn't know what her dx is and wants to talk with the Dr to clarify it. She is IVC, and has to take the meds and she did. Showered tonight. Focused on bacteria, thinks she is getting athletes foot from our floors, wanted to clean her room with the sani wipes but would not allow her to do so. She is frequently in and out of the bathroom. Showered tonight on her own.

## 2018-11-17 NOTE — Consult Note (Signed)
[facility] Psych ED Progress Note  11/17/2018 10:40 AM [name]  MRN:  [id number]   TTS Assessment Note: reviewed by this provider:  BREEONA Jimenez is a 38 y.o. female who was brought [facility] via police under an IVC order that was completed by her mother. Pt shares she was told she has MH issues and that her mother is concerned about her, though she disagrees about this and is unable to identify what mental health issues her mother might be referring to. Pt had surgery on her L ankle months ago and she states it continues to bother her. Pt states she has a poor died and that she sleeps "sporatically" throughout the day and night. Pt denies SI; she states that, in her 43s, she had thoughts of killing herself at times but never attempted to kill herself. At this point, pt wonders if she needs to have her attorney present to participate in the assessment. Clinician explained that this assessment is being used to help determined how to best help pt and get her any services she might need. Pt requested to use the restroom and, upon returning, she stated she was not going to answer any more questions without her attorney present. Pt was oriented x3; she was unsure of the date, stating she thought it was October but she wasn't sure if it was 2020. Pt's memory was UTA. Pt was polite through the part of the assessment that was completed. Pt's insight, judgement, and impulse control is impaired at this time.  Subjective:  [name], 38 y.o., female patient seen via tele psych by this provider, Dr. Lucianne Muss; and chart reviewed on 11/17/18.  On evaluation [name] states that she needs a lawyer for her mental health rights.  Patient continues to respond to questions related to her mental health "I don't want to talk about it .  I don't want to answer theses questions.  I don't live here, I don't know who your are; and I'm fine right now.  I don't want to talk."  Provider reintroduced self and where  employed and reason was asking questions. Patient responded "I need legal health to work on my case. I don't want to talk right now; this is only getting me agitated."    During evaluation [name] is alert/oriented x 3; calm/ but guarded, and irritable.  Patient smile with a pleasant hello; and then agitated and irritable with questioning.  There has not been any change in behavior.  She continues to appear to be responding to internal or external stimuli (staring out into the room).    Principal Problem: Bipolar 1 disorder (HCC) Diagnosis:  Principal Problem:   Bipolar 1 disorder (HCC) Active Problems:   Anxiety state   Depression   ADD (attention deficit disorder)   Bipolar I disorder, single manic episode, severe, with psychosis (HCC)  Total Time spent with patient: 20 minutes  Past Psychiatric History: See above  Past Medical History:  Past Medical History:  Diagnosis Date  . ADD 10/22/2009  . ALLERGIC RHINITIS 09/26/2008  . Anemia   . ANXIETY 09/26/2008  . Asthma   . Complication of anesthesia    "tempermental" per patient, "aggiated and hostile"  . CONSTIPATION 09/26/2008  . DEPRESSION 09/26/2008  . FATIGUE 01/14/2009  . Head injury with loss of consciousness (HCC)   . Headache   . HYPERLIPIDEMIA 09/26/2008  . IBS (irritable bowel syndrome)   . OTITIS MEDIA, LEFT 01/14/2009  .  SKIN LESION 09/26/2008  . Sleep apnea   . TONSILLITIS, ACUTE 01/14/2009  . Unconscious state ([place]) 10/13/2017  . URI 10/22/2009    Past Surgical History:  Procedure Laterality Date  . CERVICAL POLYPECTOMY  2009  . CERVICAL POLYPECTOMY  2019  . DILATATION & CURETTAGE/HYSTEROSCOPY WITH MYOSURE N/A 11/17/2017   Procedure: DILATATION & CURETTAGE/HYSTEROSCOPY WITH MYOSURE;  Surgeon: Thurnell Lose, MD;  Location: [facility];  Service: Gynecology;  Laterality: N/A;  . ORIF ANKLE FRACTURE Right 08/24/2018   Procedure: OPEN REDUCTION INTERNAL FIXATION TRIMALLEOLAR ANKLE FRACTURE;  Surgeon: Marybelle Killings, MD;   Location: [place];  Service: Orthopedics;  Laterality: Right;   Family History:  Family History  Problem Relation Age of Onset  . Diabetes Other   . Hypertension Other   . Thyroid disease Mother   . Hypertension Mother    Family Psychiatric  History: Unaware Social History:  Social History   Substance and Sexual Activity  Alcohol Use Not Currently     Social History   Substance and Sexual Activity  Drug Use No    Social History   Socioeconomic History  . Marital status: Single    Spouse name: Not on file  . Number of children: Not on file  . Years of education: Not on file  . Highest education level: Not on file  Occupational History  . Occupation: full time Educational psychologist  Social Needs  . Financial resource strain: Not on file  . Food insecurity    Worry: Not on file    Inability: Not on file  . Transportation needs    Medical: Not on file    Non-medical: Not on file  Tobacco Use  . Smoking status: Current Every Day Smoker    Packs/day: 1.00    Years: 20.00    Pack years: 20.00    Types: Cigarettes  . Smokeless tobacco: Never Used  Substance and Sexual Activity  . Alcohol use: Not Currently  . Drug use: No  . Sexual activity: Not Currently    Birth control/protection: Abstinence  Lifestyle  . Physical activity    Days per week: Not on file    Minutes per session: Not on file  . Stress: Not on file  Relationships  . Social Herbalist on phone: Not on file    Gets together: Not on file    Attends religious service: Not on file    Active member of club or organization: Not on file    Attends meetings of clubs or organizations: Not on file    Relationship status: Not on file  Other Topics Concern  . Not on file  Social History Narrative   Lives with mother    Sleep: Poor  Appetite:  Fair  Current Medications: Current Facility-Administered Medications  Medication Dose Route Frequency Provider Last Rate Last Dose  . acetaminophen (TYLENOL)  tablet 650 mg  650 mg Oral Q6H PRN Charlesetta Shanks, MD   650 mg at 11/17/18 0918  . albuterol (VENTOLIN HFA) 108 (90 Base) MCG/ACT inhaler 2 puff  2 puff Inhalation Q6H PRN Charlesetta Shanks, MD      . benztropine (COGENTIN) tablet 0.5 mg  0.5 mg Oral BID Charlesetta Shanks, MD   0.5 mg at 11/17/18 7829  . hydrOXYzine (ATARAX/VISTARIL) tablet 25 mg  25 mg Oral TID PRN Charlesetta Shanks, MD   25 mg at 11/15/18 0930  . risperiDONE (RISPERDAL) tablet 6 mg  6 mg Oral QHS Charlesetta Shanks, MD  Current Outpatient Medications  Medication Sig Dispense Refill  . amphetamine-dextroamphetamine (ADDERALL) 30 MG tablet Take 30 mg by mouth daily.    Marland Kitchen escitalopram (LEXAPRO) 10 MG tablet Take 10 mg by mouth daily.    Marland Kitchen loratadine (CLARITIN) 10 MG tablet Take 10 mg by mouth daily as needed for allergies.    . traZODone (DESYREL) 100 MG tablet Take 1 tablet (100 mg total) by mouth at bedtime as needed and may repeat dose one time if needed for sleep. 90 tablet 1  . albuterol (PROVENTIL HFA;VENTOLIN HFA) 108 (90 Base) MCG/ACT inhaler Inhale 2 puffs into the lungs every 6 (six) hours as needed for wheezing or shortness of breath. (Patient not taking: Reported on 05/26/2018) 1 Inhaler 2  . ARIPiprazole ER (ABILIFY MAINTENA) 400 MG SRER injection Inject 2 mLs (400 mg total) into the muscle every 28 (twenty-eight) days. Due 5/30 (Patient not taking: Reported on 11/14/2018) 1 each 11  . benztropine (COGENTIN) 0.5 MG tablet Take 1 tablet (0.5 mg total) by mouth 2 (two) times daily. (Patient not taking: Reported on 11/14/2018) 60 tablet 2  . ondansetron (ZOFRAN ODT) 4 MG disintegrating tablet Take 1 tablet (4 mg total) by mouth every 8 (eight) hours as needed for nausea. (Patient not taking: Reported on 11/14/2018) 10 tablet 0  . oxyCODONE-acetaminophen (PERCOCET) 5-325 MG tablet Take 1-2 tablets by mouth every 6 (six) hours as needed for severe pain. (Patient not taking: Reported on 09/16/2018) 40 tablet 0  .  oxyCODONE-acetaminophen (PERCOCET/ROXICET) 5-325 MG tablet Take 1 tablet by mouth every 8 (eight) hours as needed for severe pain. (Patient not taking: Reported on 09/16/2018) 30 tablet 0  . risperiDONE (RISPERDAL) 3 MG tablet Take 2 tablets (6 mg total) by mouth at bedtime. (Patient not taking: Reported on 11/14/2018) 60 tablet 2  . traMADol (ULTRAM) 50 MG tablet 1 po tid prn pain (Patient not taking: Reported on 11/14/2018) 30 tablet 0  . traMADol (ULTRAM) 50 MG tablet Take 1 tablet (50 mg total) by mouth every 12 (twelve) hours as needed. (Patient not taking: Reported on 10/18/2018) 20 tablet 0    Lab Results:  No results found for this or any previous visit (from the past 48 hour(s)).  Blood Alcohol level:  Lab Results  Component Value Date   ETH <10 11/14/2018   ETH <10 05/24/2018    Physical Findings: AIMS:  , ,  ,  ,    CIWA:    COWS:     Musculoskeletal: Strength & Muscle Tone: within normal limits Gait & Station: normal Patient leans: N/A  Psychiatric Specialty Exam: Physical Exam  Nursing note and vitals reviewed. Constitutional: She is oriented to person, place, and time. She appears well-nourished.  Respiratory: Effort normal.  Musculoskeletal: Normal range of motion.  Neurological: She is alert and oriented to person, place, and time.  Psychiatric: Her mood appears anxious. She is actively hallucinating. Thought content is paranoid.    Review of Systems  Psychiatric/Behavioral: Positive for hallucinations. The patient is nervous/anxious and has insomnia.   All other systems reviewed and are negative.   Blood pressure (!) 142/83, pulse 87, temperature 97.9 F (36.6 C), temperature source Oral, resp. rate 18, height  (1.753 m), weight 102.1 kg, SpO2 100 %.Body mass index is 33.23 kg/m.  General Appearance: Casual  Eye Contact:  Fair  Speech:  Pressured  Volume:  Normal  Mood:  Angry, Anxious and Irritable  Affect:  Labile  Thought Process:  Disorganized   Orientation:  Full (Time, Place, and Person)  Thought Content:  Patient denies hallucinations, delusions, and paranoia.  But appears to be responding to stimuli  Suicidal Thoughts:  Denies  Homicidal Thoughts:  Denies  Memory:  Unable to assess related to patient refusing to answer most questions  Judgement:  Impaired  Insight:  Lacking  Psychomotor Activity:  Normal  Concentration:  Concentration: Fair and Attention Span: Fair  Recall:  Unable to assess related to patient refusing to answer most questions  Fund of Knowledge:  Fair  Language:  Fair  Akathisia:  No  Handed:  Right  AIMS (if indicated):     Assets:  Engineer, maintenanceCommunication Skills Housing Social Support  ADL's:  Intact  Cognition:  WNL  Sleep:       Treatment Plan Summary: Daily contact with patient to assess and evaluate symptoms and progress in treatment, Medication management and Plan Continue to look for inpatient psychiatric placement  Recommendation/Disposition:  Continue current medications; no changes at this time.  Continue to seek placement for inpatient psychiatric treatment  Will continue current recommendation and disposition of inpatient psychiatric treatment.  No other changes at this time.  Possibility of starting patient on Thorazine(but will need patients consent) which she will not give related to paranoid ideation.  Inpatient psychiatric hospitalization is best treatment course.     , NP 11/17/2018, 10:40 AM

## 2018-11-17 NOTE — ED Notes (Signed)
Labile. Irritable. Demanding

## 2018-11-17 NOTE — BH Assessment (Signed)
[place] Assessment Progress Note  Per Hampton Abbot, MD, this pt requires psychiatric hospitalization at this time.  Pt is under IVC initiated by pt's mother and upheld by Dr Dwyane Dee.  The following facilities have been contacted to seek placement for this pt, with results as noted:  Beds available, information sent, decision pending:  [place]:  [place], [place] Coordinator [phone]

## 2018-11-17 NOTE — ED Notes (Signed)
Called out from her bed, a noise that suggested she was in pain. Writer checked on her, asked that her bed be flattened out, it was her bed causing her pain. Offered her Vistaril , too soon for her Tylenol for ankle pain, she declined it. Offered her an ice pack, declined it, offered her warm blanket, she accepted it. Quiet, but awake.

## 2018-11-18 ENCOUNTER — Inpatient Hospital Stay (HOSPITAL_COMMUNITY)
Admission: AD | Admit: 2018-11-18 | Discharge: 2018-11-24 | DRG: 885 | Disposition: A | Discharge status: Home / Self Care | Payer: No Typology Code available for payment source | Attending: Psychiatry | Admitting: Psychiatry

## 2018-11-18 DIAGNOSIS — Z8349 Family history of other endocrine, nutritional and metabolic diseases: Secondary | ICD-10-CM | POA: Diagnosis not present

## 2018-11-18 DIAGNOSIS — G4700 Insomnia, unspecified: Secondary | ICD-10-CM | POA: Diagnosis present

## 2018-11-18 DIAGNOSIS — F319 Bipolar disorder, unspecified: Secondary | ICD-10-CM | POA: Diagnosis present

## 2018-11-18 DIAGNOSIS — Z8249 Family history of ischemic heart disease and other diseases of the circulatory system: Secondary | ICD-10-CM

## 2018-11-18 DIAGNOSIS — Z9114 Patient's other noncompliance with medication regimen: Secondary | ICD-10-CM | POA: Diagnosis not present

## 2018-11-18 DIAGNOSIS — R000 Tachycardia, unspecified: Secondary | ICD-10-CM | POA: Diagnosis present

## 2018-11-18 DIAGNOSIS — Z833 Family history of diabetes mellitus: Secondary | ICD-10-CM

## 2018-11-18 LAB — BASIC METABOLIC PANEL
Anion gap: 9 (ref 5–15)
BUN: 11 mg/dL (ref 6–20)
CO2: 22 mmol/L (ref 22–32)
Calcium: 9 mg/dL (ref 8.9–10.3)
Chloride: 106 mmol/L (ref 98–111)
Creatinine, Ser: 0.59 mg/dL (ref 0.44–1.00)
GFR calc Af Amer: >60 mL/min (ref >60)
GFR calc non Af Amer: >60 mL/min (ref >60)
Glucose, Bld: 116 mg/dL — ABNORMAL HIGH (ref 70–99)
Potassium: 3.4 mmol/L — ABNORMAL LOW (ref 3.5–5.1)
Sodium: 137 mmol/L (ref 135–145)

## 2018-11-18 LAB — CBC
HCT: 41.2 % (ref 36.0–46.0)
Hemoglobin: 12.9 g/dL (ref 12.0–15.0)
MCH: 30.5 pg (ref 26.0–34.0)
MCHC: 31.3 g/dL (ref 30.0–36.0)
MCV: 97.4 fL (ref 80.0–100.0)
Platelets: 274 10*3/uL (ref 150–400)
RBC: 4.23 MIL/uL (ref 3.87–5.11)
RDW: 13.2 % (ref 11.5–15.5)
WBC: 7.9 10*3/uL (ref 4.0–10.5)
nRBC: 0 % (ref 0.0–0.2)

## 2018-11-18 MED ORDER — RISPERIDONE 3 MG PO TABS
6.0000 mg | ORAL_TABLET | Freq: Every day | ORAL | Status: DC
  Administered 2018-11-18 – 2018-11-21 (×4): 6 mg via ORAL
  Filled 2018-11-18 (×6): qty 2

## 2018-11-18 MED ORDER — ALUM & MAG HYDROXIDE-SIMETH 200-200-20 MG/5ML PO SUSP: 30.0000 mL | ORAL | Status: DC | PRN

## 2018-11-18 MED ORDER — ALBUTEROL SULFATE HFA 108 (90 BASE) MCG/ACT IN AERS: 2.0000 | INHALATION_SPRAY | Freq: Four times a day (QID) | RESPIRATORY_TRACT | Status: DC | PRN

## 2018-11-18 MED ORDER — HYDROXYZINE HCL 25 MG PO TABS
25.0000 mg | ORAL_TABLET | Freq: Three times a day (TID) | ORAL | Status: DC | PRN
  Administered 2018-11-21 – 2018-11-23 (×3): 25 mg via ORAL
  Filled 2018-11-18 (×3): qty 1

## 2018-11-18 MED ORDER — ZIPRASIDONE MESYLATE 20 MG IM SOLR: 20.0000 mg | Freq: Once | INTRAMUSCULAR | Status: DC | PRN

## 2018-11-18 MED ORDER — BENZTROPINE MESYLATE 0.5 MG PO TABS
0.5000 mg | ORAL_TABLET | Freq: Two times a day (BID) | ORAL | Status: DC
  Administered 2018-11-18 – 2018-11-24 (×12): 0.5 mg via ORAL
  Filled 2018-11-18 (×18): qty 1

## 2018-11-18 MED ORDER — POLYETHYLENE GLYCOL 3350 17 G PO PACK
17.0000 g | PACK | Freq: Once | ORAL | Status: DC
  Filled 2018-11-18: qty 1

## 2018-11-18 MED ORDER — ZIPRASIDONE MESYLATE 20 MG IM SOLR: 20.0000 mg | Freq: Once | INTRAMUSCULAR | Status: DC

## 2018-11-18 MED ORDER — MAGNESIUM HYDROXIDE 400 MG/5ML PO SUSP
30.0000 mL | Freq: Every day | ORAL | Status: DC | PRN
  Administered 2018-11-22: 30 mL via ORAL
  Filled 2018-11-18: qty 30

## 2018-11-18 MED ORDER — ACETAMINOPHEN 325 MG PO TABS
650.0000 mg | ORAL_TABLET | Freq: Four times a day (QID) | ORAL | Status: DC | PRN
  Administered 2018-11-18 – 2018-11-23 (×11): 650 mg via ORAL
  Filled 2018-11-18 (×11): qty 2

## 2018-11-18 MED ORDER — POLYETHYLENE GLYCOL 3350 17 GM/SCOOP PO POWD
1.0000 | Freq: Once | ORAL | Status: DC
  Filled 2018-11-18: qty 255

## 2018-11-18 NOTE — ED Notes (Signed)
HR with dinamap is 122. Manually it is 23.  Report this to Presenter, broadcasting at [facility].

## 2018-11-18 NOTE — ED Notes (Signed)
Vitals taken this am and pulse 153. Gave her Tylenol for her ankle pain and Vistaril for anxiety and to help with BP. She states on wakening she doesn't feel good, but vague with sx. States finally when asked her stomach hurt and asked and was given ginger ale. Asked if she got some good sleep, she said no. Asked why she didn't let staff know. I believed she slept, she said she didn't with what was going on. Did not provide details about what she meant in regards to "all that was going on" even when Probation officer questioned this statement. Unit was quiet last night, from staff standpoint nothing was going on. She is currently making quiet repetitive noises, sing song like but without words. She is not disruptive with it.Once and a while with make a louder, sharp noise indicating pain and states the bed isnt very comfortable and her ankle hurts.

## 2018-11-18 NOTE — Progress Notes (Signed)
Contacted [facility] Charge nurse was able to make contact to inform sending RN that the patient room was ready and available and [facility] was able to receive the patient at this time.  Sending nurse will arrange transportation and [facility] is awaiting patient's arrival.

## 2018-11-18 NOTE — ED Notes (Signed)
Patient has extra blood in the main lab one red tube and one dark green tube

## 2018-11-18 NOTE — ED Notes (Signed)
Transported to [facility] by [employer]. Belongings returned to [facility] for pt. Pt was cooperative.

## 2018-11-18 NOTE — ED Notes (Signed)
Currently refusing EKG. Stated that she will allow blood draw after lunch. Pitcher of water and snacks given.

## 2018-11-18 NOTE — ED Notes (Signed)
Report given to [facility] RN at [facility]. Room is not ready yet. They will call when the room is ready.

## 2018-11-18 NOTE — ED Notes (Signed)
When pt tried to elope she had no difficulty running on her injured ankle.

## 2018-11-18 NOTE — Progress Notes (Signed)
11/18/2018  0930  Notified MD of patient's HR 125. Waiting for orders/Will continue to monitor.

## 2018-11-18 NOTE — ED Notes (Signed)
Called [employer] for transport to [facility] 

## 2018-11-18 NOTE — BH Assessment (Signed)
[place] Assessment Progress Note  Per Hampton Abbot, MD, this pt requires psychiatric hospitalization.  Heather, RN has assigned pt to [facility] Rm [id number], pending an anticipated discharge.  Pt presents under IVC initiated by pt's mother, and upheld by Dwyane Dee, and IVC documents have been faxed to [facility].  Pt's nurse, Diane, has been notified, and agrees to call report to [phone].  Pt is to be transported via Event organiser.   Jalene Mullet, [place] Coordinator [phone]

## 2018-11-18 NOTE — Progress Notes (Signed)
Attempted to contact [facility] ED to inform nurse that room was ready to receive patient.  No one was available to answer.  Will attempt to contact again shortly.

## 2018-11-18 NOTE — Progress Notes (Signed)
Adult Psychoeducational Group Note  Date:  11/18/2018 Time:  9:02 PM  Group Topic/Focus:  Wrap-Up Group:   The focus of this group is to help patients review their daily goal of treatment and discuss progress on daily workbooks.  Participation Level:  Did Not Attend  Participation Quality:  Did Not Attend  Affect:  Did Not Attend  Cognitive:  Did Not Attend  Insight: None  Engagement in Group:  Did Not Attend  Modes of Intervention:  Did Not Attend  Additional Comments:  Pt did not attend evening wrap up group tonight.  Candy Sledge 11/18/2018, 9:02 PM

## 2018-11-18 NOTE — ED Notes (Signed)
Pt tried to elope off the unit, ran to the door, but was stopped by security. She has been up to the nurse's station trying to mess with equipment. Demanding and oppositional. Playing games with staff.  For example, she will refuse a procedure but then say that she will let someone else do it. When someone else shows up she says, "No I want you to do it."  Demands food constantly. Food has been given to her each time she asks.

## 2018-11-18 NOTE — ED Notes (Signed)
Pt refused lab draw from this Probation officer. Another staff member went in with the cart to get the blood.

## 2018-11-18 NOTE — ED Provider Notes (Addendum)
Vitals:   11/18/18 0858 11/18/18 0945  BP: 119/66   Pulse: (!) 124 (!) 107  Resp: 16   Temp: 98.1 F (36.7 C)   SpO2: 97%    Pt medically cleared previously.  Awaiting psychiatric placement. Patient noted to be tachycardic this morning.  This was repeated and she is slightly less tachycardic but I will check an EKG and recheck her laboratory tests.     Dorie Rank, MD 11/20/18 1144

## 2018-11-19 ENCOUNTER — Other Ambulatory Visit: Payer: Self-pay

## 2018-11-19 ENCOUNTER — Encounter (HOSPITAL_COMMUNITY): Payer: Self-pay

## 2018-11-19 DIAGNOSIS — F319 Bipolar disorder, unspecified: Principal | ICD-10-CM

## 2018-11-19 MED ORDER — LORAZEPAM 1 MG PO TABS: 1.0000 mg | ORAL_TABLET | Freq: Four times a day (QID) | ORAL | Status: DC | PRN

## 2018-11-19 NOTE — Progress Notes (Signed)
DAR NOTE: Patient presents with anxious affect and mood.  Denies suicidal thoughts, auditory and visual hallucinations.  Described energy level as low and concentration as poor.  Rates depression at 5, hopelessness at 5, and anxiety at 5.  Maintained on routine safety checks.  Medications given as prescribed.  Support and encouragement offered as needed.  Attended group and participated.  States goal for today is "getting out of this situation."  Patient observed socializing with peers in the dayroom.  Patient ambulating on the unit with a walker.

## 2018-11-19 NOTE — [facility] Group Notes (Signed)
  [facility] LCSW Group Therapy Note  Date/Time:  11/19/2018 11:15AM-12:00PM  Type of Therapy and Topic:  Group Therapy:  Feelings About Hospitalization  Participation Level:  Active   Description of Group This process group involved patients discussing their feelings related to being hospitalized, as well as the benefits they see to being in the hospital.  These feelings and benefits were itemized.  The group then brainstormed specific ways in which they could seek those same benefits when they discharge and return home.  Therapeutic Goals 1. Patient will identify and describe positive and negative feelings related to hospitalization 2. Patient will verbalize benefits of hospitalization to themselves personally 3. Patients will brainstorm together ways they can obtain similar benefits in the outpatient setting, identify barriers to wellness and possible solutions  Summary of Patient Progress:  The patient expressed her primary feelings about being hospitalized are that she is glad to be here "as long as I am making progress and people are doing their jobs."  She talked about staying healthy by taking her medicines, going to appointments, and communicating with people.  Therapeutic Modalities Cognitive Behavioral Therapy Motivational Interviewing    Selmer Dominion, LCSW 11/19/2018, 3:36 PM

## 2018-11-19 NOTE — Progress Notes (Signed)
Pt was on the unit at the beginning of the shift, pt was searched and brought back to the unit by previous shift,  admission was completed by Probation officer as best that could be done. Half way through admission pt stated " I don't want to answer your questions anymore"   Admission Note:  38 yr female who presents IVC in no acute distress for the treatment of SI and Depression. Pt appears flat and depressed. Pt not cooperative with admission process.  A: POC and unit policies explained and understanding verbalized.    R: Pt had no additional questions or concerns , refused to finish admission.

## 2018-11-19 NOTE — Progress Notes (Signed)
Pt up most of the night trying to ask for pain medication, but pt informed to elevate her foot and pt given ice pack. Pt was encouraged to talk to the doctor in the morning. Pt acted like this the last time she was at [facility]. Pt tried to be manipulative , and med seeking.

## 2018-11-19 NOTE — [facility] Suicide Risk Assessment (Signed)
[place] INPATIENT:  Family/Significant Other Suicide Prevention Education  Suicide Prevention Education:  Patient Refusal for Family/Significant Other Suicide Prevention Education: The patient [name] has refused to provide written consent for family/significant other to be provided Family/Significant Other Suicide Prevention Education during admission and/or prior to discharge.  Physician notified.  Patient was provided a brochure which was explained to her briefly.  Berlin Hun Grossman-Orr 11/19/2018, 3:59 PM

## 2018-11-19 NOTE — H&P (Signed)
Psychiatric Admission Assessment Adult  Patient Identification: PENDA VENTURI MRN:  [id number] Date of Evaluation:  11/19/2018 Chief Complaint:  Bipolar disorder Principal Diagnosis: <principal problem not specified> Diagnosis:  Active Problems:   Bipolar 1 disorder (HCC)  History of Present Illness: Patient is seen and examined.  Patient is a 38 year old female with a past psychiatric history significant for bipolar disorder who presented to the [facility] emergency department on 11/14/2018 under involuntary commitment from her mother.  On initial evaluation patient told the triage nurse in the emergency room that her mom just love sending her to the psychiatric hospital.  Reportedly the patient was not eating, sleeping or tending to hygiene.  She also has been going to peoples houses at 3:00 in the morning.  The patient is not a very cooperative historian.  She has a reported history of bipolar disorder type I.  She was last hospitalized at our facility on 05/25/2018.  At that time she was discharged on the long-acting Abilify injection, Lexapro, Risperdal, and trazodone.  The patient stated that she had stopped some of these medications after discharge because "my psychiatrist told me I did not need them".  She stated that she had gone back on Adderall.  The PMP database revealed a prescription for Adderall 20 mg p.o. twice daily last on 10/07/2018.  Prior to that she had not received Adderall since July 2020.  The Adderall was prescribed by Dr. Quintella Reichert.  On her last hospitalization on admission the patient would not speak to her physician.  She was noted to be paranoid.  In 2019 she had also been paranoid and believed she was being followed, poisoned and spied on.  At that time she had been in treated with Lexapro as well as Abilify.  She was admitted to the hospital for evaluation and stabilization.  Associated Signs/Symptoms: Depression Symptoms:   anhedonia, insomnia, psychomotor agitation, fatigue, feelings of worthlessness/guilt, difficulty concentrating, anxiety, loss of energy/fatigue, disturbed sleep, weight loss, (Hypo) Manic Symptoms:  Distractibility, Impulsivity, Irritable Mood, Anxiety Symptoms:  Excessive Worry, Psychotic Symptoms:  Delusions, PTSD Symptoms: Negative Total Time spent with patient: 30 minutes  Past Psychiatric History: Patient has had several psychiatric hospitalizations in the past.  Her most recent at our facility was on 05/26/2018.  She has been diagnosed with bipolar disorder.  She also says she has attention deficit hyperactivity disorder.  Is the patient at risk to self? No.  Has the patient been a risk to self in the past 6 months? Yes.    Has the patient been a risk to self within the distant past? No.  Is the patient a risk to others? Yes.    Has the patient been a risk to others in the past 6 months? No.  Has the patient been a risk to others within the distant past? No.   Prior Inpatient Therapy:   Prior Outpatient Therapy:    Alcohol Screening: 1. How often do you have a drink containing alcohol?: 2 to 3 times a week 2. How many drinks containing alcohol do you have on a typical day when you are drinking?: 1 or 2 3. How often do you have six or more drinks on one occasion?: Less than monthly AUDIT-C Score: 4 4. How often during the last year have you found that you were not able to stop drinking once you had started?: Never 5. How often during the last year have you failed to do what was normally expected from you becasue  of drinking?: Never 6. How often during the last year have you needed a first drink in the morning to get yourself going after a heavy drinking session?: Never 7. How often during the last year have you had a feeling of guilt of remorse after drinking?: Never 8. How often during the last year have you been unable to remember what happened the night before because  you had been drinking?: Never 9. Have you or someone else been injured as a result of your drinking?: No 10. Has a relative or friend or a doctor or another health worker been concerned about your drinking or suggested you cut down?: No Alcohol Use Disorder Identification Test Final Score (AUDIT): 4 Substance Abuse History in the last 12 months:  No. Consequences of Substance Abuse: Negative Previous Psychotropic Medications: Yes  Psychological Evaluations: Yes  Past Medical History:  Past Medical History:  Diagnosis Date  . ADD 10/22/2009  . ALLERGIC RHINITIS 09/26/2008  . Anemia   . ANXIETY 09/26/2008  . Asthma   . Complication of anesthesia    "tempermental" per patient, "aggiated and hostile"  . CONSTIPATION 09/26/2008  . DEPRESSION 09/26/2008  . FATIGUE 01/14/2009  . Head injury with loss of consciousness (HCC)   . Headache   . HYPERLIPIDEMIA 09/26/2008  . IBS (irritable bowel syndrome)   . OTITIS MEDIA, LEFT 01/14/2009  . SKIN LESION 09/26/2008  . Sleep apnea   . TONSILLITIS, ACUTE 01/14/2009  . Unconscious state (HCC) 10/13/2017  . URI 10/22/2009    Past Surgical History:  Procedure Laterality Date  . CERVICAL POLYPECTOMY  2009  . CERVICAL POLYPECTOMY  2019  . DILATATION & CURETTAGE/HYSTEROSCOPY WITH MYOSURE N/A 11/17/2017   Procedure: DILATATION & CURETTAGE/HYSTEROSCOPY WITH MYOSURE;  Surgeon: Geryl Rankins, MD;  Location: [facility];  Service: Gynecology;  Laterality: N/A;  . ORIF ANKLE FRACTURE Right 08/24/2018   Procedure: OPEN REDUCTION INTERNAL FIXATION TRIMALLEOLAR ANKLE FRACTURE;  Surgeon: Eldred Manges, MD;  Location: [facility] OR;  Service: Orthopedics;  Laterality: Right;   Family History:  Family History  Problem Relation Age of Onset  . Diabetes Other   . Hypertension Other   . Thyroid disease Mother   . Hypertension Mother    Family Psychiatric  History: Noncontributory Tobacco Screening: Have you used any form of tobacco in the last 30 days? (Cigarettes, Smokeless  Tobacco, Cigars, and/or Pipes): Yes Tobacco use, Select all that apply: 5 or more cigarettes per day Are you interested in Tobacco Cessation Medications?: No, patient refused Counseled patient on smoking cessation including recognizing danger situations, developing coping skills and basic information about quitting provided: Refused/Declined practical counseling Social History:  Social History   Substance and Sexual Activity  Alcohol Use Not Currently     Social History   Substance and Sexual Activity  Drug Use No    Additional Social History:                           Allergies:  No Known Allergies Lab Results:  Results for orders placed or performed during the hospital encounter of 11/14/18 (from the past 48 hour(s))  CBC     Status: None   Collection Time: 11/18/18 11:51 AM  Result Value Ref Range   WBC 7.9 4.0 - 10.5 K/uL   RBC 4.23 3.87 - 5.11 MIL/uL   Hemoglobin 12.9 12.0 - 15.0 g/dL   HCT 40.9 81.1 - 91.4 %   MCV 97.4 80.0 - 100.0 fL  MCH 30.5 26.0 - 34.0 pg   MCHC 31.3 30.0 - 36.0 g/dL   RDW 16.113.2 09.611.5 - 04.515.5 %   Platelets 274 150 - 400 K/uL   nRBC 0.0 0.0 - 0.2 %    Comment: Performed at [facility], [address] [place], [place] [address]  Basic metabolic panel     Status: Abnormal   Collection Time: 11/18/18 11:51 AM  Result Value Ref Range   Sodium 137 135 - 145 mmol/L   Potassium 3.4 (L) 3.5 - 5.1 mmol/L   Chloride 106 98 - 111 mmol/L   CO2 22 22 - 32 mmol/L   Glucose, Bld 116 (H) 70 - 99 mg/dL   BUN 11 6 - 20 mg/dL   Creatinine, Ser 1.910.59 0.44 - 1.00 mg/dL   Calcium 9.0 8.9 - 47.810.3 mg/dL   GFR calc non Af Amer >60 >60 mL/min   GFR calc Af Amer >60 >60 mL/min   Anion gap 9 5 - 15    Comment: Performed at [facility], [address] [place], [place] [address]    Blood Alcohol level:  Lab Results  Component Value Date   ETH <10 11/14/2018   ETH <10 05/24/2018    Metabolic Disorder Labs:  Lab  Results  Component Value Date   HGBA1C 5.1 05/26/2018   MPG 99.67 05/26/2018   No results found for: PROLACTIN Lab Results  Component Value Date   CHOL 138 05/26/2018   TRIG 90 05/26/2018   HDL 49 05/26/2018   CHOLHDL 2.8 05/26/2018   VLDL 18 05/26/2018   LDLCALC 71 05/26/2018   LDLCALC 104 (H) 03/11/2017    Current Medications: Current Facility-Administered Medications  Medication Dose Route Frequency Provider Last Rate Last Dose  . acetaminophen (TYLENOL) tablet 650 mg  650 mg Oral Q6H PRN Anike, Adaku C, NP   650 mg at 11/19/18 0810  . albuterol (VENTOLIN HFA) 108 (90 Base) MCG/ACT inhaler 2 puff  2 puff Inhalation Q6H PRN Patrcia Dollyate, Tina L, FNP      . alum & mag hydroxide-simeth (MAALOX/MYLANTA) 200-200-20 MG/5ML suspension 30 mL  30 mL Oral Q4H PRN Patrcia Dollyate, Tina L, FNP      . benztropine (COGENTIN) tablet 0.5 mg  0.5 mg Oral BID Patrcia Dollyate, Tina L, FNP   0.5 mg at 11/19/18 0809  . hydrOXYzine (ATARAX/VISTARIL) tablet 25 mg  25 mg Oral TID PRN Patrcia Dollyate, Tina L, FNP      . LORazepam (ATIVAN) tablet 1 mg  1 mg Oral Q6H PRN Antonieta Pertlary,  Lawson, MD      . magnesium hydroxide (MILK OF MAGNESIA) suspension 30 mL  30 mL Oral Daily PRN Patrcia Dollyate, Tina L, FNP      . risperiDONE (RISPERDAL) tablet 6 mg  6 mg Oral QHS Patrcia Dollyate, Tina L, FNP   6 mg at 11/18/18 2126   PTA Medications: Medications Prior to Admission  Medication Sig Dispense Refill Last Dose  . albuterol (PROVENTIL HFA;VENTOLIN HFA) 108 (90 Base) MCG/ACT inhaler Inhale 2 puffs into the lungs every 6 (six) hours as needed for wheezing or shortness of breath. (Patient not taking: Reported on 05/26/2018) 1 Inhaler 2   . amphetamine-dextroamphetamine (ADDERALL) 30 MG tablet Take 30 mg by mouth daily.     . ARIPiprazole ER (ABILIFY MAINTENA) 400 MG SRER injection Inject 2 mLs (400 mg total) into the muscle every 28 (twenty-eight) days. Due 5/30 (Patient not taking: Reported on 11/14/2018) 1 each 11   . benztropine (COGENTIN)  0.5 MG tablet Take 1 tablet (0.5 mg  total) by mouth 2 (two) times daily. (Patient not taking: Reported on 11/14/2018) 60 tablet 2   . escitalopram (LEXAPRO) 10 MG tablet Take 10 mg by mouth daily.     Marland Kitchen loratadine (CLARITIN) 10 MG tablet Take 10 mg by mouth daily as needed for allergies.     Marland Kitchen ondansetron (ZOFRAN ODT) 4 MG disintegrating tablet Take 1 tablet (4 mg total) by mouth every 8 (eight) hours as needed for nausea. (Patient not taking: Reported on 11/14/2018) 10 tablet 0   . oxyCODONE-acetaminophen (PERCOCET) 5-325 MG tablet Take 1-2 tablets by mouth every 6 (six) hours as needed for severe pain. (Patient not taking: Reported on 09/16/2018) 40 tablet 0   . oxyCODONE-acetaminophen (PERCOCET/ROXICET) 5-325 MG tablet Take 1 tablet by mouth every 8 (eight) hours as needed for severe pain. (Patient not taking: Reported on 09/16/2018) 30 tablet 0   . risperiDONE (RISPERDAL) 3 MG tablet Take 2 tablets (6 mg total) by mouth at bedtime. (Patient not taking: Reported on 11/14/2018) 60 tablet 2   . traMADol (ULTRAM) 50 MG tablet 1 po tid prn pain (Patient not taking: Reported on 11/14/2018) 30 tablet 0   . traMADol (ULTRAM) 50 MG tablet Take 1 tablet (50 mg total) by mouth every 12 (twelve) hours as needed. (Patient not taking: Reported on 10/18/2018) 20 tablet 0   . traZODone (DESYREL) 100 MG tablet Take 1 tablet (100 mg total) by mouth at bedtime as needed and may repeat dose one time if needed for sleep. 90 tablet 1     Musculoskeletal: Strength & Muscle Tone: within normal limits Gait & Station: broad based Patient leans: N/A  Psychiatric Specialty Exam: Physical Exam  Nursing note and vitals reviewed. Constitutional: She is oriented to person, place, and time. She appears well-developed and well-nourished.  HENT:  Head: Normocephalic and atraumatic.  Respiratory: Effort normal.  Neurological: She is alert and oriented to person, place, and time.    ROS  Blood pressure (!) 120/100, pulse (!) 110, temperature 98.1 F (36.7 C),  temperature source Oral, resp. rate 18, height 5\' 9"  (1.753 m), weight 102.1 kg, SpO2 100 %.Body mass index is 33.23 kg/m.  General Appearance: Disheveled  Eye Contact:  Fair  Speech:  Normal Rate  Volume:  Increased  Mood:  Dysphoric and Irritable  Affect:  Labile  Thought Process:  Goal Directed and Descriptions of Associations: Circumstantial  Orientation:  Full (Time, Place, and Person)  Thought Content:  Delusions and Paranoid Ideation  Suicidal Thoughts:  No  Homicidal Thoughts:  No  Memory:  Immediate;   Poor Recent;   Poor Remote;   Poor  Judgement:  Impaired  Insight:  Lacking  Psychomotor Activity:  Increased  Concentration:  Concentration: Fair and Attention Span: Fair  Recall:  Poor  Fund of Knowledge:  Fair  Language:  Fair  Akathisia:  Negative  Handed:  Right  AIMS (if indicated):     Assets:  Desire for Improvement Resilience  ADL's:  Impaired  Cognition:  WNL  Sleep:  Number of Hours: 1.25    Treatment Plan Summary: Daily contact with patient to assess and evaluate symptoms and progress in treatment, Medication management and Plan : Patient is seen and examined.  Patient is a 38 year old female with the above-stated past psychiatric history who was admitted to the hospital secondary to noncompliance with medications, increased manic symptoms, increased psychosis, poor hygiene and poor self-care.  She will be admitted  to the hospital.  She will be integrated into the milieu.  She will be encouraged to attend groups.  On discharge on last hospitalization she was given Abilify the 400 mg intramuscular injection every 28 days.  That was apparently due on 06/25/2018.  It is unclear at this point whether or not she had been compliant with that.  She had also been discharged on Risperdal 6 mg p.o. nightly.  That appears to have been restarted already.  She only got 1 hour of sleep here, and it is unclear better compliance.  We will try and track down her medications and see  if she had gotten the Abilify injection on a regular basis.  [facility].  Clearly Adderall in this patient is a bad idea.  I will also put on some lorazepam 1 mg p.o. 3 times daily as needed agitation.  It is also unclear about the fracture in her right lower extremity.  Apparently she has caused some problems in the orthopedic office.  She is using a walker, wheelchair.  She had had some lower extremity swelling, but after her surgery is felt to be resolved.  It is unclear why she continues to wear the boot or to have problems with her lower extremity.  We will keep the boot in place at least at this point.  Observation Level/Precautions:  15 minute checks  Laboratory:  Chemistry Profile  Psychotherapy:    Medications:    Consultations:    Discharge Concerns:    Estimated LOS:  Other:     Physician Treatment Plan for Primary Diagnosis: <principal problem not specified> Long Term Goal(s): Improvement in symptoms so as ready for discharge  Short Term Goals: Ability to identify changes in lifestyle to reduce recurrence of condition will improve, Ability to verbalize feelings will improve, Ability to disclose and discuss suicidal ideas, Ability to demonstrate self-control will improve, Ability to identify and develop effective coping behaviors will improve, Ability to maintain clinical measurements within normal limits will improve and Compliance with prescribed medications will improve  Physician Treatment Plan for Secondary Diagnosis: Active Problems:   Bipolar 1 disorder ([place])  Long Term Goal(s): Improvement in symptoms so as ready for discharge  Short Term Goals: Ability to identify changes in lifestyle to reduce recurrence of condition will improve, Ability to verbalize feelings will improve, Ability to disclose and discuss suicidal ideas, Ability to demonstrate self-control will improve, Ability to identify and develop effective coping behaviors  will improve, Ability to maintain clinical measurements within normal limits will improve and Compliance with prescribed medications will improve  I certify that inpatient services furnished can reasonably be expected to improve the patient's condition.    Sharma Covert, MD 10/24/20201:18 PM
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
## 2018-11-20 DIAGNOSIS — F319 Bipolar disorder, unspecified: Secondary | ICD-10-CM | POA: Diagnosis not present

## 2018-11-20 MED ORDER — NICOTINE POLACRILEX 2 MG MT GUM
2.0000 mg | CHEWING_GUM | OROMUCOSAL | Status: DC | PRN
  Administered 2018-11-21 – 2018-11-22 (×3): 2 mg via ORAL

## 2018-11-20 NOTE — Progress Notes (Signed)
D:  Patient's self inventory sheet, patient sleeps good, no sleep medication.  Fair appetite, low energy level, good concentration.  Rated depression, hopeless and anxiety 7.  Denied withdrawals.   Then checked cravings, cramping, nausea, irritability.  Denied SI.  Physical problems, pain, dizziness, blurred vision.  Physical pain, worst pain #7, R ankle.  Pain medicine helpful.  Goal is balance blood sugar.  Plans to eat the right food.  Does have discharge plans. A:  Medications administered per MD orders.  Emotional support and encouragement given patient. R:  Denied SI and HI, contracts for safety.  Denied A/V hallucinations.  Safety maintained with 15 minute checks.

## 2018-11-20 NOTE — BHH Group Notes (Signed)
[place] LCSW Group Therapy Note  Date/Time:  11/20/2018  11:00AM-12:00PM  Type of Therapy and Topic:  Group Therapy:  Music and Mood  Participation Level:  Minimal   Description of Group: In this process group, members listened to a variety of genres of music and identified that different types of music evoke different responses.  Patients were encouraged to identify music that was soothing for them and music that was energizing for them.  Patients discussed how this knowledge can help with wellness and recovery in various ways including managing depression and anxiety as well as encouraging healthy sleep habits.    Therapeutic Goals: 1. Patients will explore the impact of different varieties of music on mood 2. Patients will verbalize the thoughts they have when listening to different types of music 3. Patients will identify music that is soothing to them as well as music that is energizing to them 4. Patients will discuss how to use this knowledge to assist in maintaining wellness and recovery 5. Patients will explore the use of music as a coping skill  Summary of Patient Progress:  At the beginning of group, patient expressed that she felt restless.  This was borne out throughout group, as she left and returned numerous times.      Therapeutic Modalities: Solution Focused Brief Therapy Activity   Selmer Dominion, LCSW

## 2018-11-20 NOTE — Plan of Care (Signed)
Nurse discussed anxiety, depression and coping skills with patient.  
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
## 2018-11-21 DIAGNOSIS — F319 Bipolar disorder, unspecified: Secondary | ICD-10-CM | POA: Diagnosis not present

## 2018-11-21 MED ORDER — CARBAMAZEPINE 100 MG PO CHEW
100.0000 mg | CHEWABLE_TABLET | Freq: Three times a day (TID) | ORAL | Status: DC
  Administered 2018-11-21 – 2018-11-24 (×9): 100 mg via ORAL
  Filled 2018-11-21 (×16): qty 1

## 2018-11-21 NOTE — Progress Notes (Signed)
Recreation Therapy Notes  Date: 10.26.20 Time: 1006 Location: 500 Hall Dayroom  Group Topic: Wellness  Goal Area(s) Addresses:  Patient will define components of whole wellness. Patient will verbalize benefit of whole wellness.  Behavioral Response: Engaged  Intervention: Music  Activity:  Exercise.  LRT led group in a series of stretches.  Each member the group got the chance to lead an exercise of their choice.  The group was to complete 3 rounds of exercise or 30 minutes of exercise.  Patients were instructed to take breaks and drink water as needed.  Education: Wellness, Dentist.   Education Outcome: Acknowledges education/In group clarification offered/Needs additional education.   Clinical Observations/Feedback:  Pt was engaged and completed modified versions of exercises without hurting her leg.  Pt was bright and social with peers.  Pt was attentive and appropriate throughout.       Victorino Sparrow, LRT/CTRS    Victorino Sparrow A 11/21/2018 10:54 AM
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
## 2018-11-21 NOTE — Progress Notes (Signed)
D: Pt denies SI/HI/AVH. Pt visible on the unit . Pt forwards little to writer A: Pt was offered support and encouragement. Pt was given scheduled medications. Pt was encourage to attend groups. Q 15 minute checks were done for safety.   R:Pt attends groups and interacts well with peers and staff. Pt is taking medication. Pt has no complaints .Pt receptive to treatment and safety maintained on unit.

## 2018-11-21 NOTE — Progress Notes (Signed)
Patient stated she would like ibuprofen added to her medication list.
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
## 2018-11-21 NOTE — [facility] Group Notes (Signed)
  [facility] LCSW Group Therapy Note  Date/Time: 11/21/2018 @ 1:30pm  Type of Therapy/Topic:  Group Therapy:  Emotion Regulation  Participation Level:  Did Not Attend   Mood: Did not attend   Description of Group:    The purpose of this group is to assist patients in learning to regulate negative emotions and experience positive emotions. Patients will be guided to discuss ways in which they have been vulnerable to their negative emotions. These vulnerabilities will be juxtaposed with experiences of positive emotions or situations, and patients challenged to use positive emotions to combat negative ones. Special emphasis will be placed on coping with negative emotions in conflict situations, and patients will process healthy conflict resolution skills.  Therapeutic Goals: 1. Patient will identify two positive emotions or experiences to reflect on in order to balance out negative emotions:  2. Patient will label two or more emotions that they find the most difficult to experience:  3. Patient will be able to demonstrate positive conflict resolution skills through discussion or role plays:   Summary of Patient Progress:    Patient did not attend group therapy today.    Therapeutic Modalities:   Cognitive Behavioral Therapy Feelings Identification Dialectical Behavioral Therapy   Neev Mcmains, LCSW  

## 2018-11-21 NOTE — Progress Notes (Signed)
Adult Psychoeducational Group Note  Date:  11/21/2018 Time:  3:12 AM  Group Topic/Focus:  Wrap-Up Group:   The focus of this group is to help patients review their daily goal of treatment and discuss progress on daily workbooks.  Participation Level:  Active  Participation Quality:  Appropriate  Affect:  Appropriate  Cognitive:  Appropriate  Insight: Appropriate  Engagement in Group:  Developing/Improving  Modes of Intervention:  Discussion  Additional Comments: Pt stated her goal for today was to focus on her treatment plan. Pt stated she accomplished her goal today. Pt stated her relationship with her family and support system has improved since she was admitted here. Pt stated been able to contact her mother and a friend today help improve her day.  Pt stated she felt better about herself today. Pt rated her over all day a 5 out of 10. Pt stated her appetite was improved today. Pt stated her goal for tonight was to get some sleep. Pt stated she had pain in her right ankle tonight. Pt nurse was made aware of the situation. Pt stated she was not hearing or seeing anything that was not there. Pt stated she had no thoughts of harming herself or others. Pt stated if anything change she would alert staff.    Candy Sledge 11/21/2018, 3:12 AM

## 2018-11-21 NOTE — Tx Team (Signed)
Interdisciplinary Treatment and Diagnostic Plan Update  11/21/2018 Time of Session: 10:45am [name] MRN: [id number]  Principal Diagnosis: <principal problem not specified>  Secondary Diagnoses: Active Problems:   Bipolar 1 disorder (HCC)   Current Medications:  Current Facility-Administered Medications  Medication Dose Route Frequency Provider Last Rate Last Dose  . acetaminophen (TYLENOL) tablet 650 mg  650 mg Oral Q6H PRN Anike, Adaku C, NP   650 mg at 11/21/18 1145  . albuterol (VENTOLIN HFA) 108 (90 Base) MCG/ACT inhaler 2 puff  2 puff Inhalation Q6H PRN Patrcia Dollyate, Tina L, FNP      . alum & mag hydroxide-simeth (MAALOX/MYLANTA) 200-200-20 MG/5ML suspension 30 mL  30 mL Oral Q4H PRN Patrcia Dollyate, Tina L, FNP      . benztropine (COGENTIN) tablet 0.5 mg  0.5 mg Oral BID Patrcia Dollyate, Tina L, FNP   0.5 mg at 11/21/18 0756  . carbamazepine (TEGRETOL) chewable tablet 100 mg  100 mg Oral TID Malvin JohnsFarah, Brian, MD   100 mg at 11/21/18 1306  . hydrOXYzine (ATARAX/VISTARIL) tablet 25 mg  25 mg Oral TID PRN Patrcia Dollyate, Tina L, FNP      . LORazepam (ATIVAN) tablet 1 mg  1 mg Oral Q6H PRN Antonieta Pertlary, Greg Lawson, MD      . magnesium hydroxide (MILK OF MAGNESIA) suspension 30 mL  30 mL Oral Daily PRN Patrcia Dollyate, Tina L, FNP      . nicotine polacrilex (NICORETTE) gum 2 mg  2 mg Oral PRN Nira ConnBerry, Jason A, NP      . risperiDONE (RISPERDAL) tablet 6 mg  6 mg Oral QHS Patrcia Dollyate, Tina L, FNP   6 mg at 11/20/18 2130   PTA Medications: Medications Prior to Admission  Medication Sig Dispense Refill Last Dose  . albuterol (PROVENTIL HFA;VENTOLIN HFA) 108 (90 Base) MCG/ACT inhaler Inhale 2 puffs into the lungs every 6 (six) hours as needed for wheezing or shortness of breath. (Patient not taking: Reported on 05/26/2018) 1 Inhaler 2   . amphetamine-dextroamphetamine (ADDERALL) 30 MG tablet Take 30 mg by mouth daily.     . ARIPiprazole ER (ABILIFY MAINTENA) 400 MG SRER injection Inject 2 mLs (400 mg total) into the muscle every 28 (twenty-eight)  days. Due 5/30 (Patient not taking: Reported on 11/14/2018) 1 each 11   . benztropine (COGENTIN) 0.5 MG tablet Take 1 tablet (0.5 mg total) by mouth 2 (two) times daily. (Patient not taking: Reported on 11/14/2018) 60 tablet 2   . escitalopram (LEXAPRO) 10 MG tablet Take 10 mg by mouth daily.     Marland Kitchen. loratadine (CLARITIN) 10 MG tablet Take 10 mg by mouth daily as needed for allergies.     Marland Kitchen. ondansetron (ZOFRAN ODT) 4 MG disintegrating tablet Take 1 tablet (4 mg total) by mouth every 8 (eight) hours as needed for nausea. (Patient not taking: Reported on 11/14/2018) 10 tablet 0   . oxyCODONE-acetaminophen (PERCOCET) 5-325 MG tablet Take 1-2 tablets by mouth every 6 (six) hours as needed for severe pain. (Patient not taking: Reported on 09/16/2018) 40 tablet 0   . oxyCODONE-acetaminophen (PERCOCET/ROXICET) 5-325 MG tablet Take 1 tablet by mouth every 8 (eight) hours as needed for severe pain. (Patient not taking: Reported on 09/16/2018) 30 tablet 0   . risperiDONE (RISPERDAL) 3 MG tablet Take 2 tablets (6 mg total) by mouth at bedtime. (Patient not taking: Reported on 11/14/2018) 60 tablet 2   . traMADol (ULTRAM) 50 MG tablet 1 po tid prn pain (Patient not taking: Reported on 11/14/2018) 30 tablet  0   . traMADol (ULTRAM) 50 MG tablet Take 1 tablet (50 mg total) by mouth every 12 (twelve) hours as needed. (Patient not taking: Reported on 10/18/2018) 20 tablet 0   . traZODone (DESYREL) 100 MG tablet Take 1 tablet (100 mg total) by mouth at bedtime as needed and may repeat dose one time if needed for sleep. 90 tablet 1     Patient Stressors:    Patient Strengths:    Treatment Modalities: Medication Management, Group therapy, Case management,  1 to 1 session with clinician, Psychoeducation, Recreational therapy.   Physician Treatment Plan for Primary Diagnosis: <principal problem not specified> Long Term Goal(s): Improvement in symptoms so as ready for discharge Improvement in symptoms so as ready for  discharge   Short Term Goals: Ability to identify changes in lifestyle to reduce recurrence of condition will improve Ability to verbalize feelings will improve Ability to disclose and discuss suicidal ideas Ability to demonstrate self-control will improve Ability to identify and develop effective coping behaviors will improve Ability to maintain clinical measurements within normal limits will improve Compliance with prescribed medications will improve Ability to identify changes in lifestyle to reduce recurrence of condition will improve Ability to verbalize feelings will improve Ability to disclose and discuss suicidal ideas Ability to demonstrate self-control will improve Ability to identify and develop effective coping behaviors will improve Ability to maintain clinical measurements within normal limits will improve Compliance with prescribed medications will improve  Medication Management: Evaluate patient's response, side effects, and tolerance of medication regimen.  Therapeutic Interventions: 1 to 1 sessions, Unit Group sessions and Medication administration.  Evaluation of Outcomes: Progressing  Physician Treatment Plan for Secondary Diagnosis: Active Problems:   Bipolar 1 disorder ([place])  Long Term Goal(s): Improvement in symptoms so as ready for discharge Improvement in symptoms so as ready for discharge   Short Term Goals: Ability to identify changes in lifestyle to reduce recurrence of condition will improve Ability to verbalize feelings will improve Ability to disclose and discuss suicidal ideas Ability to demonstrate self-control will improve Ability to identify and develop effective coping behaviors will improve Ability to maintain clinical measurements within normal limits will improve Compliance with prescribed medications will improve Ability to identify changes in lifestyle to reduce recurrence of condition will improve Ability to verbalize feelings will  improve Ability to disclose and discuss suicidal ideas Ability to demonstrate self-control will improve Ability to identify and develop effective coping behaviors will improve Ability to maintain clinical measurements within normal limits will improve Compliance with prescribed medications will improve     Medication Management: Evaluate patient's response, side effects, and tolerance of medication regimen.  Therapeutic Interventions: 1 to 1 sessions, Unit Group sessions and Medication administration.  Evaluation of Outcomes: Progressing   RN Treatment Plan for Primary Diagnosis: <principal problem not specified> Long Term Goal(s): Knowledge of disease and therapeutic regimen to maintain health will improve  Short Term Goals: Ability to participate in decision making will improve, Ability to verbalize feelings will improve, Ability to disclose and discuss suicidal ideas, Ability to identify and develop effective coping behaviors will improve and Compliance with prescribed medications will improve  Medication Management: RN will administer medications as ordered by provider, will assess and evaluate patient's response and provide education to patient for prescribed medication. RN will report any adverse and/or side effects to prescribing provider.  Therapeutic Interventions: 1 on 1 counseling sessions, Psychoeducation, Medication administration, Evaluate responses to treatment, Monitor vital signs and CBGs as ordered, Perform/monitor CIWA,  COWS, AIMS and Fall Risk screenings as ordered, Perform wound care treatments as ordered.  Evaluation of Outcomes: Progressing   LCSW Treatment Plan for Primary Diagnosis: <principal problem not specified> Long Term Goal(s): Safe transition to appropriate next level of care at discharge, Engage patient in therapeutic group addressing interpersonal concerns.  Short Term Goals: Engage patient in aftercare planning with referrals and resources and Increase  skills for wellness and recovery  Therapeutic Interventions: Assess for all discharge needs, 1 to 1 time with Social worker, Explore available resources and support systems, Assess for adequacy in community support network, Educate family and significant other(s) on suicide prevention, Complete Psychosocial Assessment, Interpersonal group therapy.  Evaluation of Outcomes: Progressing   Progress in Treatment: Attending groups: Yes. Participating in groups: Yes. Taking medication as prescribed: Yes. Toleration medication: Yes. Family/Significant other contact made: No, will contact:  pt declined; SPE with pt Patient understands diagnosis: Yes. Discussing patient identified problems/goals with staff: Yes. Medical problems stabilized or resolved: No.; Tissue swelling per x ray Denies suicidal/homicidal ideation: Yes. Issues/concerns per patient self-inventory: No. Other:   New problem(s) identified: No, Describe:  None  New Short Term/Long Term Goal(s): Medication stabilization, elimination of SI thoughts, and development of a comprehensive mental wellness plan.   Patient Goals:  "get balanced and healthier"   Discharge Plan or Barriers: Patient will discharge home and follow up with [facility] for medication management and therapy.   Reason for Continuation of Hospitalization: Anxiety Depression Medical Issues Medication stabilization  Estimated Length of Stay: 3-5 days   Attendees: Patient: [name] 11/21/2018   Physician: Dr. Malvin Johns, MD 11/21/2018   Nursing: Casimiro Needle, RN 11/21/2018   RN Care Manager: 11/21/2018   Social Worker: Stephannie Peters, LCSW  11/21/2018   Recreational Therapist:  11/21/2018  Other:  11/21/2018  Other:  11/21/2018   Other: 11/21/2018      Scribe for Treatment Team: Delphia Grates, LCSW 11/21/2018 1:14 PM

## 2018-11-21 NOTE — Progress Notes (Signed)
Patient has been up and active on the unit, attended group this evening and has voiced no complaints. Patient currently denies having pain, -si/hi/a/v hall. Support and encouragement offered, safety maintained on unit, will continue to monitor.  

## 2018-11-22 DIAGNOSIS — F319 Bipolar disorder, unspecified: Secondary | ICD-10-CM | POA: Diagnosis not present

## 2018-11-22 MED ORDER — RISPERIDONE 2 MG PO TABS: 2.0000 mg | ORAL_TABLET | Freq: Every day | ORAL | Status: DC

## 2018-11-22 MED ORDER — RISPERIDONE 2 MG PO TABS
7.0000 mg | ORAL_TABLET | Freq: Every day | ORAL | Status: DC
  Administered 2018-11-22 – 2018-11-23 (×2): 7 mg via ORAL
  Filled 2018-11-22 (×4): qty 3.5

## 2018-11-22 NOTE — Progress Notes (Signed)
Adult Psychoeducational Group Note  Date:  11/22/2018 Time:  3:18 AM  Group Topic/Focus:  Wrap-Up Group:   The focus of this group is to help patients review their daily goal of treatment and discuss progress on daily workbooks.  Participation Level:  Active  Participation Quality:  Appropriate  Affect:  Appropriate  Cognitive:  Appropriate  Insight: Appropriate  Engagement in Group:  Developing/Improving  Modes of Intervention:  Discussion  Additional Comments:  Pt stated her goal for today was to focus on her treatment plan. Pt stated she accomplished her goal today. Pt stated her relationship with her family and support system has improved since she was admitted here. Pt stated been able to contact her mother and sister today help her improve her day. Pt stated she felt better about herself today. Pt rated her over all day a 5 out of 10. Pt stated her appetite was pretty good today. Pt stated her goal for tonight was to get some sleep. Pt stated she had pain in her right ankle this evening. Pt nurse was made aware of the situation. Pt stated she was not hearing or seeing anything that was not there. Pt stated she had no thoughts of harming herself or others. Pt stated if anything change she would alert staff.  Candy Sledge 11/22/2018, 3:18 AM

## 2018-11-22 NOTE — Progress Notes (Signed)
[facility] MD Progress Note  11/22/2018 10:39 AM [name]  MRN:  [id number] Subjective:   Pt seen on rounds, and is overall cordial, seems more organized than initial presentation and past admissions Alert-oriented compliant thus far- No questions about medications when discussed risks- side effects - expected benefits npo eps - no td denies hallucinations Mild ankle "ache" type pain reported  Principal Problem: exacerbation of psychosis Diagnosis: Active Problems:   Bipolar 1 disorder ([place])  Total Time spent with patient: 20 minutes  Past Psychiatric History: see eval  Past Medical History:  Past Medical History:  Diagnosis Date  . ADD 10/22/2009  . ALLERGIC RHINITIS 09/26/2008  . Anemia   . ANXIETY 09/26/2008  . Asthma   . Complication of anesthesia    "tempermental" per patient, "aggiated and hostile"  . CONSTIPATION 09/26/2008  . DEPRESSION 09/26/2008  . FATIGUE 01/14/2009  . Head injury with loss of consciousness ([place])   . Headache   . HYPERLIPIDEMIA 09/26/2008  . IBS (irritable bowel syndrome)   . OTITIS MEDIA, LEFT 01/14/2009  . SKIN LESION 09/26/2008  . Sleep apnea   . TONSILLITIS, ACUTE 01/14/2009  . Unconscious state ([place]) 10/13/2017  . URI 10/22/2009    Past Surgical History:  Procedure Laterality Date  . CERVICAL POLYPECTOMY  2009  . CERVICAL POLYPECTOMY  2019  . DILATATION & CURETTAGE/HYSTEROSCOPY WITH MYOSURE N/A 11/17/2017   Procedure: DILATATION & CURETTAGE/HYSTEROSCOPY WITH MYOSURE;  Surgeon: Thurnell Lose, MD;  Location: [facility];  Service: Gynecology;  Laterality: N/A;  . ORIF ANKLE FRACTURE Right 08/24/2018   Procedure: OPEN REDUCTION INTERNAL FIXATION TRIMALLEOLAR ANKLE FRACTURE;  Surgeon: Marybelle Killings, MD;  Location: [facility];  Service: Orthopedics;  Laterality: Right;   Family History:  Family History  Problem Relation Age of Onset  . Diabetes Other   . Hypertension Other   . Thyroid disease Mother   . Hypertension Mother    Family Psychiatric   History: see eval Social History:  Social History   Substance and Sexual Activity  Alcohol Use Not Currently     Social History   Substance and Sexual Activity  Drug Use No    Social History   Socioeconomic History  . Marital status: Single    Spouse name: Not on file  . Number of children: Not on file  . Years of education: Not on file  . Highest education level: Not on file  Occupational History  . Occupation: full time Educational psychologist  Social Needs  . Financial resource strain: Not on file  . Food insecurity    Worry: Not on file    Inability: Not on file  . Transportation needs    Medical: Not on file    Non-medical: Not on file  Tobacco Use  . Smoking status: Current Every Day Smoker    Packs/day: 1.00    Years: 20.00    Pack years: 20.00    Types: Cigarettes  . Smokeless tobacco: Never Used  Substance and Sexual Activity  . Alcohol use: Not Currently  . Drug use: No  . Sexual activity: Not Currently    Birth control/protection: Abstinence  Lifestyle  . Physical activity    Days per week: Not on file    Minutes per session: Not on file  . Stress: Not on file  Relationships  . Social Herbalist on phone: Not on file    Gets together: Not on file    Attends religious service: Not on file  Active member of club or organization: Not on file    Attends meetings of clubs or organizations: Not on file    Relationship status: Not on file  Other Topics Concern  . Not on file  Social History Narrative   Lives with mother   Additional Social History:                         Sleep: Fair  Appetite:  Good  Current Medications: Current Facility-Administered Medications  Medication Dose Route Frequency Provider Last Rate Last Dose  . acetaminophen (TYLENOL) tablet 650 mg  650 mg Oral Q6H PRN Anike, Adaku C, NP   650 mg at 11/22/18 0331  . albuterol (VENTOLIN HFA) 108 (90 Base) MCG/ACT inhaler 2 puff  2 puff Inhalation Q6H PRN Patrcia Dolly,  FNP      . alum & mag hydroxide-simeth (MAALOX/MYLANTA) 200-200-20 MG/5ML suspension 30 mL  30 mL Oral Q4H PRN Patrcia Dolly, FNP      . benztropine (COGENTIN) tablet 0.5 mg  0.5 mg Oral BID Patrcia Dolly, FNP   0.5 mg at 11/22/18 0937  . carbamazepine (TEGRETOL) chewable tablet 100 mg  100 mg Oral TID Malvin Johns, MD   100 mg at 11/22/18 0762  . hydrOXYzine (ATARAX/VISTARIL) tablet 25 mg  25 mg Oral TID PRN Patrcia Dolly, FNP   25 mg at 11/21/18 2042  . LORazepam (ATIVAN) tablet 1 mg  1 mg Oral Q6H PRN Antonieta Pert, MD      . magnesium hydroxide (MILK OF MAGNESIA) suspension 30 mL  30 mL Oral Daily PRN Patrcia Dolly, FNP   30 mL at 11/22/18 2633  . nicotine polacrilex (NICORETTE) gum 2 mg  2 mg Oral PRN Jackelyn Poling, NP   2 mg at 11/21/18 2044  . risperiDONE (RISPERDAL) tablet 6 mg  6 mg Oral QHS Patrcia Dolly, FNP   6 mg at 11/21/18 2042    Lab Results: No results found for this or any previous visit (from the past 48 hour(s)).  Blood Alcohol level:  Lab Results  Component Value Date   ETH <10 11/14/2018   ETH <10 05/24/2018    Metabolic Disorder Labs: Lab Results  Component Value Date   HGBA1C 5.1 05/26/2018   MPG 99.67 05/26/2018   No results found for: PROLACTIN Lab Results  Component Value Date   CHOL 138 05/26/2018   TRIG 90 05/26/2018   HDL 49 05/26/2018   CHOLHDL 2.8 05/26/2018   VLDL 18 05/26/2018   LDLCALC 71 05/26/2018   LDLCALC 104 (H) 03/11/2017    Physical Findings: AIMS: Facial and Oral Movements Muscles of Facial Expression: None, normal Lips and Perioral Area: None, normal Jaw: None, normal Tongue: None, normal,Extremity Movements Upper (arms, wrists, hands, fingers): None, normal Lower (legs, knees, ankles, toes): None, normal, Trunk Movements Neck, shoulders, hips: None, normal, Overall Severity Severity of abnormal movements (highest score from questions above): None, normal Incapacitation due to abnormal movements: None, normal Patient's  awareness of abnormal movements (rate only patient's report): No Awareness, Dental Status Current problems with teeth and/or dentures?: No Does patient usually wear dentures?: No  CIWA:  CIWA-Ar Total: 7 COWS:  COWS Total Score: 3  Musculoskeletal: Strength & Muscle Tone: within normal limits Gait & Station: normal/given ankle pain Patient leans: N/A  Psychiatric Specialty Exam: Physical Exam  ROS  Blood pressure (!) 95/58, pulse (!) 119, temperature 97.7 F (36.5 C), temperature  source Oral, resp. rate 20, height 5\' 9"  (1.753 m), weight 102.1 kg, SpO2 100 %.Body mass index is 33.23 kg/m.  General Appearance: Casual  Eye Contact:  Absent  Speech:  Normal Rate  Volume:  Decreased  Mood:  Dysphoric  Affect:  Restricted  Thought Process:  Linear and Descriptions of Associations: Tangential  Orientation:  Full (Time, Place, and Person)  Thought Content:  Rumination  Suicidal Thoughts:  No  Homicidal Thoughts:  No  Memory:  Immediate;   Fair Recent;   Fair Remote;   Fair  Judgement:  Fair  Insight:  Improved  Psychomotor Activity:  Normal  Concentration:  Concentration: Fair and Attention Span: Fair  Recall:  [employer]Fair  Fund of Knowledge:  Fair  Language:  Fair  Akathisia:  Negative  Handed:  Right  AIMS (if indicated):     Assets:  Psychologist, counsellingCommunication Skills Financial Resources/Insurance  ADL's:  Intact  Cognition:  WNL  Sleep:  Number of Hours: 5.75     Treatment Plan Summary: Daily contact with patient to assess and evaluate symptoms and progress in treatment and Medication management  Continue current precautions and reallity based therapy Continue and adjust antipsychotic and mood stabilizer therapy   Janet Frazee, MD 11/22/2018, 10:39 AM

## 2018-11-23 DIAGNOSIS — F319 Bipolar disorder, unspecified: Secondary | ICD-10-CM | POA: Diagnosis not present

## 2018-11-23 MED ORDER — PALIPERIDONE PALMITATE ER 234 MG/1.5ML IM SUSY
234.0000 mg | PREFILLED_SYRINGE | Freq: Once | INTRAMUSCULAR | Status: AC
  Administered 2018-11-23: 234 mg via INTRAMUSCULAR
  Filled 2018-11-23: qty 1.5

## 2018-11-23 NOTE — Progress Notes (Signed)
D: Pt has been visible in the dayroom. Pt not questioning or arguing about her medications this evening. Pt appeared to have brighter affect this evening. Pt did not express paranoia about her roommate.   A: Pt was offered support and encouragement. Pt was given scheduled medications. Pt was encourage to attend groups. Q 15 minute checks were done for safety.   R:Pt attends groups and interacts well with peers and staff. Pt is taking medication. Pt has no complaints.Pt receptive to treatment and safety maintained on unit.

## 2018-11-23 NOTE — Progress Notes (Signed)
[facility]  11/23/2018 12:44 PM [name]  MRN:  [id number] Subjective:   Patient is showing much improvement she is compliant with meds she is not argumentative and does not express delusional beliefs today.  She denies auditory or visual hallucinations.  We are going to discussed risk benefits and side effects of long-acting injectable further.  Try to call mother with her permission left a message Principal Problem: Exacerbation of underlying psychotic disorder Diagnosis: Active Problems:   Bipolar 1 disorder ([facility])  Total Time spent with patient: 20 minutes  Past Psychiatric History: See eval  Past Medical History:  Past Medical History:  Diagnosis Date  . ADD 10/22/2009  . ALLERGIC RHINITIS 09/26/2008  . Anemia   . ANXIETY 09/26/2008  . Asthma   . Complication of anesthesia    "tempermental" per patient, "aggiated and hostile"  . CONSTIPATION 09/26/2008  . DEPRESSION 09/26/2008  . FATIGUE 01/14/2009  . Head injury with loss of consciousness ([facility])   . Headache   . HYPERLIPIDEMIA 09/26/2008  . IBS (irritable bowel syndrome)   . OTITIS MEDIA, LEFT 01/14/2009  . SKIN LESION 09/26/2008  . Sleep apnea   . TONSILLITIS, ACUTE 01/14/2009  . Unconscious state ([facility]) 10/13/2017  . URI 10/22/2009    Past Surgical History:  Procedure Laterality Date  . CERVICAL POLYPECTOMY  2009  . CERVICAL POLYPECTOMY  2019  . DILATATION & CURETTAGE/HYSTEROSCOPY WITH MYOSURE N/A 11/17/2017   Procedure: DILATATION & CURETTAGE/HYSTEROSCOPY WITH MYOSURE;  Surgeon: Geryl RankinsVarnado, Evelyn, MD;  Location: [facility];  Service: Gynecology;  Laterality: N/A;  . ORIF ANKLE FRACTURE Right 08/24/2018   Procedure: OPEN REDUCTION INTERNAL FIXATION TRIMALLEOLAR ANKLE FRACTURE;  Surgeon: Eldred MangesYates, Mark C, MD;  Location: [facility];  Service: Orthopedics;  Laterality: Right;   Family History:  Family History  Problem Relation Age of Onset  . Diabetes Other   . Hypertension Other   . Thyroid disease Mother   . Hypertension  Mother    Family Psychiatric  History: See eval Social History:  Social History   Substance and Sexual Activity  Alcohol Use Not Currently     Social History   Substance and Sexual Activity  Drug Use No    Social History   Socioeconomic History  . Marital status: Single    Spouse name: Not on file  . Number of children: Not on file  . Years of education: Not on file  . Highest education level: Not on file  Occupational History  . 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 Financial resource strain: Not on file  . Food insecurity    Worry: Not on file    Inability: Not on file  . Transportation needs    Medical: Not on file    Non-medical: Not on file  Tobacco Use  . Smoking status: Current Every Day Smoker    Packs/day: 1.00    Years: 20.00    Pack years: 20.00    Types: Cigarettes  . Smokeless tobacco: Never Used  Substance and Sexual Activity  . Alcohol use: Not Currently  . Drug use: No  . Sexual activity: Not Currently    Birth control/protection: Abstinence  Lifestyle  . Physical activity    Days per week: Not on file    Minutes per session: Not on file  . Stress: Not on file  Relationships  . Social Musicianconnections    Talks on phone: Not on file    Gets together: Not on file  Attends religious service: Not on file    Active member of club or organization: Not on file    Attends meetings of clubs or organizations: Not on file    Relationship status: Not on file  Other Topics Concern  . Not on file  Social History Narrative   Lives with mother   Additional Social History:                         Sleep: Good  Appetite:  Good  Current Medications: Current Facility-Administered Medications  Medication Dose Route Frequency Provider Last Rate Last Dose  . acetaminophen (TYLENOL) tablet 650 mg  650 mg Oral Q6H PRN Anike, Adaku C, NP   650 mg at 11/22/18 1803  . albuterol (VENTOLIN HFA) 108 (90 Base) MCG/ACT inhaler 2 puff  2 puff  Inhalation Q6H PRN Patrcia Dolly, FNP      . alum & mag hydroxide-simeth (MAALOX/MYLANTA) 200-200-20 MG/5ML suspension 30 mL  30 mL Oral Q4H PRN Patrcia Dolly, FNP      . benztropine (COGENTIN) tablet 0.5 mg  0.5 mg Oral BID Patrcia Dolly, FNP   0.5 mg at 11/23/18 1001  . carbamazepine (TEGRETOL) chewable tablet 100 mg  100 mg Oral TID Malvin Johns, MD   100 mg at 11/23/18 1001  . hydrOXYzine (ATARAX/VISTARIL) tablet 25 mg  25 mg Oral TID PRN Patrcia Dolly, FNP   25 mg at 11/22/18 2056  . LORazepam (ATIVAN) tablet 1 mg  1 mg Oral Q6H PRN Antonieta Pert, MD      . magnesium hydroxide (MILK OF MAGNESIA) suspension 30 mL  30 mL Oral Daily PRN Patrcia Dolly, FNP   30 mL at 11/22/18 1610  . nicotine polacrilex (NICORETTE) gum 2 mg  2 mg Oral PRN Jackelyn Poling, NP   2 mg at 11/22/18 1803  . risperiDONE (RISPERDAL) tablet 7 mg  7 mg Oral QHS Malvin Johns, MD   7 mg at 11/22/18 2056    Lab Results: No results found for this or any previous visit (from the past 48 hour(s)).  Blood Alcohol level:  Lab Results  Component Value Date   ETH <10 11/14/2018   ETH <10 05/24/2018    Metabolic Disorder Labs: Lab Results  Component Value Date   HGBA1C 5.1 05/26/2018   MPG 99.67 05/26/2018   No results found for: PROLACTIN Lab Results  Component Value Date   CHOL 138 05/26/2018   TRIG 90 05/26/2018   HDL 49 05/26/2018   CHOLHDL 2.8 05/26/2018   VLDL 18 05/26/2018   LDLCALC 71 05/26/2018   LDLCALC 104 (H) 03/11/2017    Physical Findings: AIMS: Facial and Oral Movements Muscles of Facial Expression: None, normal Lips and Perioral Area: None, normal Jaw: None, normal Tongue: None, normal,Extremity Movements Upper (arms, wrists, hands, fingers): None, normal Lower (legs, knees, ankles, toes): None, normal, Trunk Movements Neck, shoulders, hips: None, normal, Overall Severity Severity of abnormal movements (highest score from questions above): None, normal Incapacitation due to abnormal  movements: None, normal Patient's awareness of abnormal movements (rate only patient's report): No Awareness, Dental Status Current problems with teeth and/or dentures?: No Does patient usually wear dentures?: No  CIWA:  CIWA-Ar Total: 7 COWS:  COWS Total Score: 3  Musculoskeletal: Strength & Muscle Tone: within normal limits Gait & Station: normal Patient leans: N/A  Psychiatric Specialty Exam: Physical Exam  ROS  Blood pressure 133/81, pulse (!) 115, temperature  97.6 F (36.4 C), temperature source Oral, resp. rate 20, height 5\' 9"  (1.753 m), weight 102.1 kg, SpO2 100 %.Body mass index is 33.23 kg/m.  General Appearance: Casual  Eye Contact:  Good  Speech:  Clear and Coherent  Volume:  Normal  Mood:  Euthymic  Affect:  Restricted  Thought Process:  Goal Directed and Descriptions of Associations: Circumstantial  Orientation:  Full (Time, Place, and Person)  Thought Content:  Tangential  Suicidal Thoughts:  No  Homicidal Thoughts:  No  Memory:  Immediate;   Fair Recent;   Fair Remote;   Fair  Judgement:  Fair  Insight:  Fair  Psychomotor Activity:  Normal  Concentration:  Concentration: Fair and Attention Span: Fair  Recall:  [employer] of Knowledge:  Fair  Language:  Fair  Akathisia:  Negative  Handed:  Right  AIMS (if indicated):     Assets:  Resilience Social Support  ADL's:  Intact  Cognition:  WNL  Sleep:  Number of Hours: 6.75     Treatment Plan Summary: Daily contact with patient to assess and evaluate symptoms and progress in treatment and Medication management  Continue current antipsychotic therapy mood stabilizer therapy discuss with team probable Invega long-acting injectable when she agrees we will review her long acting injectable side effects risks and benefits first again  [facility], MD 11/23/2018, 12:44 PM

## 2018-11-23 NOTE — Progress Notes (Signed)
Adult Psychoeducational Group Note  Date:  11/23/2018 Time:  12:48 AM  Group Topic/Focus:  Wrap-Up Group:   The focus of this group is to help patients review their daily goal of treatment and discuss progress on daily workbooks.  Participation Level:  Active  Participation Quality:  Appropriate  Affect:  Appropriate  Cognitive:  Appropriate  Insight: Appropriate  Engagement in Group:  Developing/Improving  Modes of Intervention:  Discussion  Additional Comments:  Pt stated her goal for today was to get some rest. Pt stated she was able to accomplish her goal today.  Pt stated her relationship with her family has improved since she was admitted here. Pt stated contacting her mother today help improved her day.  Pt stated she felt about the same about herself today. Pt rated her overall day a 4 out of 10. Pt stated her appetite was pretty good today. Pt stated her goal for tonight is to get some sleep. Pt did complain of having pain in her right ankle tonight. Pt's nurse was made aware of the situation. Pt stated she was not hearing voices or seeing anything that was not there.  Pt stated she had no thoughts of harming herself or others. Pt stated she would alert staff if anything changes  Candy Sledge 11/23/2018, 12:48 AM

## 2018-11-23 NOTE — Progress Notes (Signed)
Recreation Therapy Notes  Date: 10.28.20 Time: 1000 Location: 500 Hall Dayroom  Group Topic: Coping Skills  Goal Area(s) Addresses:  Patient will identify positive coping skills. Patient will identify benefits of using coping skills.  Behavioral Response: None  Intervention:  Worksheet, pencils  Activity:  Healthy vs Unhealthy Coping Strategies.  Patients were to identify a current problem they are facing.  Patients were to then identify unhealthy coping strategies used and the consequences.  Patients then identified healthy coping strategies, expected outcomes and barriers to using healthy coping strategies.    Education: Radiographer, therapeutic, Dentist.   Education Outcome: Acknowledges understanding/In group clarification offered/Needs additional education.   Clinical Observations/Feedback: Pt was in group long enough to get worksheet but left and did not return.    Victorino Sparrow, LRT/CTRS    Victorino Sparrow A 11/23/2018 12:47 PM

## 2018-11-23 NOTE — Progress Notes (Signed)
Adult Psychoeducational Group Note  Date:  11/23/2018 Time:  9:02 PM  Group Topic/Focus:  Wrap-Up Group:   The focus of this group is to help patients review their daily goal of treatment and discuss progress on daily workbooks.  Participation Level:  Active  Participation Quality:  Appropriate  Affect:  Appropriate  Cognitive:  Oriented  Insight: Appropriate  Engagement in Group:  Engaged  Modes of Intervention:  Education and Support  Additional Comments:  Patient attended and participated in group tonight. She reported that today she rested most of the day. She walk the halls. Walking helps her foot to heal. She reports not feeling well today,however, she did not tell her doctor.  Salley Scarlet [facility] 11/23/2018, 9:02 PM

## 2018-11-24 DIAGNOSIS — F319 Bipolar disorder, unspecified: Secondary | ICD-10-CM | POA: Diagnosis not present

## 2018-11-24 MED ORDER — INVEGA SUSTENNA 156 MG/ML IM SUSY: PREFILLED_SYRINGE | INTRAMUSCULAR | 0 refills | Status: DC

## 2018-11-24 MED ORDER — RISPERIDONE 3 MG PO TABS: 6.0000 mg | ORAL_TABLET | Freq: Every day | ORAL | 2 refills | Status: DC

## 2018-11-24 MED ORDER — CARBAMAZEPINE 100 MG PO CHEW: CHEWABLE_TABLET | ORAL | 2 refills | Status: DC

## 2018-11-24 MED ORDER — BENZTROPINE MESYLATE 1 MG PO TABS: 1.0000 mg | ORAL_TABLET | Freq: Two times a day (BID) | ORAL | 2 refills | Status: DC

## 2018-11-24 NOTE — Progress Notes (Signed)
Patient ID: [name], female   DOB: 01/24/1981, 38 y.o.   MRN: [id number]   CSW scheduled kaizen lyft for patient for 10am. Pt's nurse was notified.

## 2018-11-24 NOTE — Progress Notes (Signed)
Recreation Therapy Notes  INPATIENT RECREATION TR PLAN  Patient Details Name: [name] MRN: [id number] DOB: 1980-03-26 Today's Date: 11/24/2018  Rec Therapy Plan Is patient appropriate for Therapeutic Recreation?: Yes Treatment times per week: about 3 days Estimated Length of Stay: 5-7 days TR Treatment/Interventions: Group participation (Comment)  Discharge Criteria Pt will be discharged from therapy if:: Discharged Treatment plan/goals/alternatives discussed and agreed upon by:: Patient/family  Discharge Summary Short term goals set: See patient care plan Short term goals met: Not met Progress toward goals comments: Groups attended Which groups?: Wellness, Coping skills Reason goals not met: Pt did not stay for duration of coping skills group. Therapeutic equipment acquired: N/Jimenez Reason patient discharged from therapy: Discharge from hospital Pt/family agrees with progress & goals achieved: Yes Date patient discharged from therapy: 11/24/18     Victorino Sparrow, LRT/CTRS  Ria Comment, [name] 11/24/2018, 9:18 AM

## 2018-11-24 NOTE — Progress Notes (Signed)
  [facility] Adult Case Management Discharge Plan :  Will you be returning to the same living situation after discharge:  Yes,  pt's home At discharge, do you have transportation home?: Yes,  Lyft  Do you have the ability to pay for your medications: No.  Release of information consent forms completed and in the chart;  Patient's signature needed at discharge.  Patient to Follow up at: Follow-up [facility] Follow up on 11/30/2018.   Why: Medication management appointment with Lollie Sails is Wednesday, 11/4 at 11:00a. Therapy appointment with Judson Roch Friday, 11/6 at 1:00p.  Contact information: [address] [place] [address] [phone]           Next level of care provider has access to [facility] and Suicide Prevention discussed: No. With pt; Pt declined   Have you used any form of tobacco in the last 30 days? (Cigarettes, Smokeless Tobacco, Cigars, and/or Pipes): Yes  Has patient been referred to the Quitline?: Patient refused referral  Patient has been referred for addiction treatment: Yes  Billey Chang, Student-Social Work 11/24/2018, 9:16 AM
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
## 2018-11-24 NOTE — Plan of Care (Signed)
Pt did not stay for duration on coping skills recreation therapy group session.   Victorino Sparrow, LRT/CTRS

## 2018-11-24 NOTE — BHH Suicide Risk Assessment (Signed)
[facility] Discharge Suicide Risk Assessment   Principal Problem: Exacerbation of bipolar type condition Discharge Diagnoses: Active Problems:   Bipolar 1 disorder (HCC)   Total Time spent with patient: 45 minutes  Musculoskeletal: Strength & Muscle Tone: within normal limits Gait & Station: normal Patient leans: N/A  Psychiatric Specialty Exam: ROS  Blood pressure 133/81, pulse (!) 115, temperature 97.6 F (36.4 C), temperature source Oral, resp. rate 20, height 5\' 9"  (1.753 m), weight 102.1 kg, SpO2 100 %.Body mass index is 33.23 kg/m.  General Appearance: Casual  Eye Contact::  Good  Speech:  Clear and Coherent409  Volume:  Normal  Mood:  Euthymic  Affect:  Full Range  Thought Process:  Coherent and Goal Directed  Orientation:  Full (Time, Place, and Person)  Thought Content:  Logical  Suicidal Thoughts:  No  Homicidal Thoughts:  No  Memory:  Immediate;   Good Recent;   Good Remote;   Good  Judgement:  Good  Insight:  Good  Psychomotor Activity:  Normal  Concentration:  Good  Recall:  Good  Fund of Knowledge:Good  Language: Good  Akathisia:  Negative  Handed:  Right  AIMS (if indicated):     Assets:  Communication Skills Desire for Improvement  Sleep:  Number of Hours: 7.5  Cognition: WNL  ADL's:  Intact   Mental Status Per Nursing Assessment::   On Admission:  NA  Demographic Factors:  Caucasian and Unemployed  Loss Factors: Decrease in vocational status  Historical Factors: NA  Risk Reduction Factors:   Positive social support and Positive therapeutic relationship  Continued Clinical Symptoms:  Previous Psychiatric Diagnoses and Treatments  Cognitive Features That Contribute To Risk:  None    Suicide Risk:  Minimal: No identifiable suicidal ideation.  Patients presenting with no risk factors but with morbid ruminations; may be classified as minimal risk based on the severity of the depressive symptoms  [facility]  Treatment Follow up on 11/30/2018.   Why: Medication management appointment with Lollie Sails is Wednesday, 11/4 at 11:00a. Therapy appointment with Judson Roch Friday, 11/6 at 1:00p.  Contact information: [address] [place] [address] [phone]           Plan Of Care/Follow-up recommendations:  Activity:  full  Soma Lizak, MD 11/24/2018, 8:21 AM

## 2018-11-24 NOTE — Plan of Care (Signed)
Discharge note  Patient verbalizes readiness for discharge. Follow up plan explained, AVS, Transition record and SRA given. Prescriptions and teaching provided. Belongings returned and signed for. Suicide safety plan completed and signed. Patient verbalizes understanding. Patient denies SI/HI and assures this Probation officer he will seek assistance should that change. Patient discharged to lobby where Lyft was waiting.  Problem: Education: Goal: Knowledge of [facility] Education information/materials will improve Outcome: Adequate for Discharge Goal: Emotional status will improve Outcome: Adequate for Discharge Goal: Mental status will improve Outcome: Adequate for Discharge Goal: Verbalization of understanding the information provided will improve Outcome: Adequate for Discharge   Problem: Activity: Goal: Interest or engagement in activities will improve Outcome: Adequate for Discharge Goal: Sleeping patterns will improve Outcome: Adequate for Discharge   Problem: Coping: Goal: Ability to verbalize frustrations and anger appropriately will improve Outcome: Adequate for Discharge Goal: Ability to demonstrate self-control will improve Outcome: Adequate for Discharge   Problem: Health Behavior/Discharge Planning: Goal: Identification of resources available to assist in meeting health care needs will improve Outcome: Adequate for Discharge Goal: Compliance with treatment plan for underlying cause of condition will improve Outcome: Adequate for Discharge   Problem: Physical Regulation: Goal: Ability to maintain clinical measurements within normal limits will improve Outcome: Adequate for Discharge   Problem: Safety: Goal: Periods of time without injury will increase Outcome: Adequate for Discharge   Problem: Activity: Goal: Will identify at least one activity in which they can participate Outcome: Adequate for Discharge   Problem: Coping: Goal: Ability to identify and develop  effective coping behavior will improve Outcome: Adequate for Discharge Goal: Ability to interact with others will improve Outcome: Adequate for Discharge Goal: Demonstration of participation in decision-making regarding own care will improve Outcome: Adequate for Discharge Goal: Ability to use eye contact when communicating with others will improve Outcome: Adequate for Discharge   Problem: Health Behavior/Discharge Planning: Goal: Identification of resources available to assist in meeting health care needs will improve Outcome: Adequate for Discharge   Problem: Self-Concept: Goal: Will verbalize positive feelings about self Outcome: Adequate for Discharge

## 2018-11-24 NOTE — Progress Notes (Signed)
Patient ID: [name], female   DOB: [birth year], 38 y.o.   MRN: [id number] Patient discharged to home/self care on her own accord.  Patient denies SI, HI and AVH.  Patient acknowledges understanding of all discharge instructions and receipt of personal belongings.

## 2018-11-29 ENCOUNTER — Ambulatory Visit (INDEPENDENT_AMBULATORY_CARE_PROVIDER_SITE_OTHER): Payer: Self-pay | Admitting: Orthopaedic Surgery

## 2018-11-29 ENCOUNTER — Encounter: Payer: Self-pay | Admitting: Orthopaedic Surgery

## 2018-11-29 ENCOUNTER — Other Ambulatory Visit: Payer: Self-pay

## 2018-11-29 ENCOUNTER — Ambulatory Visit (INDEPENDENT_AMBULATORY_CARE_PROVIDER_SITE_OTHER): Payer: Self-pay

## 2018-11-29 VITALS — BP 121/80 | HR 77 | Ht 69.0 in | Wt 225.0 lb

## 2018-11-29 DIAGNOSIS — S82851A Displaced trimalleolar fracture of right lower leg, initial encounter for closed fracture: Secondary | ICD-10-CM
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
## 2018-12-05 ENCOUNTER — Other Ambulatory Visit: Payer: Self-pay

## 2018-12-05 DIAGNOSIS — Z20822 Contact with and (suspected) exposure to covid-19: Secondary | ICD-10-CM

## 2018-12-05 NOTE — Addendum Note (Signed)
Addended by: Rolinda Roan on: 12/05/2018 03:29 PM   Modules accepted: Orders

## 2018-12-06 LAB — NOVEL CORONAVIRUS, NAA: SARS-CoV-2, NAA: NOT DETECTED

## 2018-12-06 LAB — SPECIMEN STATUS REPORT

## 2019-07-21 ENCOUNTER — Emergency Department (HOSPITAL_COMMUNITY)
Admission: EM | Admit: 2019-07-21 | Discharge: 2019-07-21 | Disposition: A | Discharge status: Home / Self Care | Payer: Self-pay | Attending: Emergency Medicine | Admitting: Emergency Medicine

## 2019-07-21 ENCOUNTER — Other Ambulatory Visit: Payer: Self-pay

## 2019-07-21 ENCOUNTER — Encounter (HOSPITAL_COMMUNITY): Payer: Self-pay | Admitting: Emergency Medicine

## 2019-07-21 ENCOUNTER — Emergency Department (HOSPITAL_COMMUNITY)
Admission: EM | Admit: 2019-07-21 | Discharge: 2019-07-22 | Disposition: A | Discharge status: Home / Self Care | Payer: Self-pay | Attending: Emergency Medicine | Admitting: Emergency Medicine

## 2019-07-21 DIAGNOSIS — Z0441 Encounter for examination and observation following alleged adult rape: Secondary | ICD-10-CM | POA: Insufficient documentation

## 2019-07-21 DIAGNOSIS — Z5321 Procedure and treatment not carried out due to patient leaving prior to being seen by health care provider: Secondary | ICD-10-CM | POA: Insufficient documentation

## 2019-07-21 DIAGNOSIS — R140 Abdominal distension (gaseous): Secondary | ICD-10-CM | POA: Insufficient documentation

## 2019-07-21 DIAGNOSIS — R55 Syncope and collapse: Secondary | ICD-10-CM | POA: Insufficient documentation

## 2019-07-21 DIAGNOSIS — R102 Pelvic and perineal pain: Secondary | ICD-10-CM | POA: Insufficient documentation

## 2019-07-21 LAB — CBC
HCT: 42.3 % (ref 36.0–46.0)
Hemoglobin: 13.7 g/dL (ref 12.0–15.0)
MCH: 30.2 pg (ref 26.0–34.0)
MCHC: 32.4 g/dL (ref 30.0–36.0)
MCV: 93.4 fL (ref 80.0–100.0)
Platelets: 294 10*3/uL (ref 150–400)
RBC: 4.53 MIL/uL (ref 3.87–5.11)
RDW: 12.8 % (ref 11.5–15.5)
WBC: 15.1 10*3/uL — ABNORMAL HIGH (ref 4.0–10.5)
nRBC: 0 % (ref 0.0–0.2)

## 2019-07-21 LAB — I-STAT BETA HCG BLOOD, ED (MC, WL, AP ONLY): I-stat hCG, quantitative: <5 m[IU]/mL (ref <5)

## 2019-07-21 LAB — URINALYSIS, ROUTINE W REFLEX MICROSCOPIC
Bilirubin Urine: NEGATIVE
Bilirubin Urine: NEGATIVE
Glucose, UA: NEGATIVE mg/dL
Glucose, UA: NEGATIVE mg/dL
Ketones, ur: 80 mg/dL — AB
Ketones, ur: 5 mg/dL — AB
Leukocytes,Ua: NEGATIVE
Leukocytes,Ua: NEGATIVE
Nitrite: NEGATIVE
Nitrite: NEGATIVE
Protein, ur: NEGATIVE mg/dL
Protein, ur: NEGATIVE mg/dL
Specific Gravity, Urine: 1.005 (ref 1.005–1.030)
Specific Gravity, Urine: 1.006 (ref 1.005–1.030)
pH: 6 (ref 5.0–8.0)
pH: 6 (ref 5.0–8.0)

## 2019-07-21 LAB — COMPREHENSIVE METABOLIC PANEL
ALT: 28 U/L (ref 0–44)
AST: 28 U/L (ref 15–41)
Albumin: 5 g/dL (ref 3.5–5.0)
Alkaline Phosphatase: 119 U/L (ref 38–126)
Anion gap: 11 (ref 5–15)
BUN: 8 mg/dL (ref 6–20)
CO2: 22 mmol/L (ref 22–32)
Calcium: 9.4 mg/dL (ref 8.9–10.3)
Chloride: 105 mmol/L (ref 98–111)
Creatinine, Ser: 0.73 mg/dL (ref 0.44–1.00)
GFR calc Af Amer: >60 mL/min (ref >60)
GFR calc non Af Amer: >60 mL/min (ref >60)
Glucose, Bld: 102 mg/dL — ABNORMAL HIGH (ref 70–99)
Potassium: 3.6 mmol/L (ref 3.5–5.1)
Sodium: 138 mmol/L (ref 135–145)
Total Bilirubin: 1 mg/dL (ref 0.3–1.2)
Total Protein: 8.1 g/dL (ref 6.5–8.1)

## 2019-07-21 LAB — LIPASE, BLOOD: Lipase: 20 U/L (ref 11–51)
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
## 2019-07-21 NOTE — ED Triage Notes (Signed)
Per pt, states her sleep pattern is being disruptive doesn't know why-states she is also having abdominal distention, states there is something in there that needs to come out-states she is suppose to be taking meds but is not-

## 2019-07-23 ENCOUNTER — Encounter (HOSPITAL_COMMUNITY): Payer: Self-pay | Admitting: Emergency Medicine

## 2019-07-23 ENCOUNTER — Emergency Department (HOSPITAL_COMMUNITY)
Admission: EM | Admit: 2019-07-23 | Discharge: 2019-07-24 | Disposition: A | Discharge status: Home / Self Care | Payer: Self-pay | Attending: Emergency Medicine | Admitting: Emergency Medicine

## 2019-07-23 ENCOUNTER — Emergency Department (HOSPITAL_COMMUNITY): Admission: EM | Admit: 2019-07-23 | Discharge: 2019-07-23 | Discharge status: Against Medical Advice | Payer: Self-pay

## 2019-07-23 ENCOUNTER — Other Ambulatory Visit: Payer: Self-pay

## 2019-07-23 DIAGNOSIS — R000 Tachycardia, unspecified: Secondary | ICD-10-CM | POA: Insufficient documentation

## 2019-07-23 DIAGNOSIS — Z79899 Other long term (current) drug therapy: Secondary | ICD-10-CM | POA: Insufficient documentation

## 2019-07-23 DIAGNOSIS — F22 Delusional disorders: Secondary | ICD-10-CM

## 2019-07-23 DIAGNOSIS — F600 Paranoid personality disorder: Secondary | ICD-10-CM | POA: Insufficient documentation

## 2019-07-23 DIAGNOSIS — Z20822 Contact with and (suspected) exposure to covid-19: Secondary | ICD-10-CM | POA: Insufficient documentation

## 2019-07-23 DIAGNOSIS — F17210 Nicotine dependence, cigarettes, uncomplicated: Secondary | ICD-10-CM | POA: Insufficient documentation

## 2019-07-23 DIAGNOSIS — J45909 Unspecified asthma, uncomplicated: Secondary | ICD-10-CM | POA: Insufficient documentation

## 2019-07-23 LAB — RAPID URINE DRUG SCREEN, HOSP PERFORMED
Amphetamines: NOT DETECTED
Barbiturates: NOT DETECTED
Benzodiazepines: NOT DETECTED
Cocaine: NOT DETECTED
Opiates: NOT DETECTED
Tetrahydrocannabinol: NOT DETECTED

## 2019-07-23 LAB — COMPREHENSIVE METABOLIC PANEL
ALT: 32 U/L (ref 0–44)
AST: 32 U/L (ref 15–41)
Albumin: 4.9 g/dL (ref 3.5–5.0)
Alkaline Phosphatase: 123 U/L (ref 38–126)
Anion gap: 14 (ref 5–15)
BUN: 6 mg/dL (ref 6–20)
CO2: 21 mmol/L — ABNORMAL LOW (ref 22–32)
Calcium: 9.5 mg/dL (ref 8.9–10.3)
Chloride: 104 mmol/L (ref 98–111)
Creatinine, Ser: 0.69 mg/dL (ref 0.44–1.00)
GFR calc Af Amer: >60 mL/min (ref >60)
GFR calc non Af Amer: >60 mL/min (ref >60)
Glucose, Bld: 97 mg/dL (ref 70–99)
Potassium: 3.6 mmol/L (ref 3.5–5.1)
Sodium: 139 mmol/L (ref 135–145)
Total Bilirubin: 1 mg/dL (ref 0.3–1.2)
Total Protein: 7.6 g/dL (ref 6.5–8.1)

## 2019-07-23 LAB — CBC WITH DIFFERENTIAL/PLATELET
Abs Immature Granulocytes: 0.06 10*3/uL (ref 0.00–0.07)
Basophils Absolute: 0.1 10*3/uL (ref 0.0–0.1)
Basophils Relative: 0 %
Eosinophils Absolute: 0.1 10*3/uL (ref 0.0–0.5)
Eosinophils Relative: 1 %
HCT: 40.4 % (ref 36.0–46.0)
Hemoglobin: 13.3 g/dL (ref 12.0–15.0)
Immature Granulocytes: 0 %
Lymphocytes Relative: 24 %
Lymphs Abs: 3.3 10*3/uL (ref 0.7–4.0)
MCH: 31 pg (ref 26.0–34.0)
MCHC: 32.9 g/dL (ref 30.0–36.0)
MCV: 94.2 fL (ref 80.0–100.0)
Monocytes Absolute: 1 10*3/uL (ref 0.1–1.0)
Monocytes Relative: 7 %
Neutro Abs: 9.6 10*3/uL — ABNORMAL HIGH (ref 1.7–7.7)
Neutrophils Relative %: 68 %
Platelets: 279 10*3/uL (ref 150–400)
RBC: 4.29 MIL/uL (ref 3.87–5.11)
RDW: 13 % (ref 11.5–15.5)
WBC: 14.1 10*3/uL — ABNORMAL HIGH (ref 4.0–10.5)
nRBC: 0 % (ref 0.0–0.2)

## 2019-07-23 LAB — SARS CORONAVIRUS 2 BY RT PCR (HOSPITAL ORDER, PERFORMED IN ~~LOC~~ HOSPITAL LAB): SARS Coronavirus 2: NEGATIVE

## 2019-07-23 LAB — I-STAT BETA HCG BLOOD, ED (MC, WL, AP ONLY): I-stat hCG, quantitative: <5 m[IU]/mL (ref <5)

## 2019-07-23 LAB — ETHANOL: Alcohol, Ethyl (B): <10 mg/dL (ref <10)

## 2019-07-23 MED ORDER — IBUPROFEN 200 MG PO TABS: 600.0000 mg | ORAL_TABLET | Freq: Three times a day (TID) | ORAL | Status: DC | PRN

## 2019-07-23 MED ORDER — ONDANSETRON HCL 4 MG PO TABS: 4.0000 mg | ORAL_TABLET | Freq: Three times a day (TID) | ORAL | Status: DC | PRN
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
## 2019-07-23 NOTE — ED Notes (Signed)
Unsuccessful blood draw x2 
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
## 2019-07-23 NOTE — ED Triage Notes (Signed)
Per pt, states she has arsenic running thru her veins-states she is not on any meds-patient states arsenic is causing her to have a rash-patient guarded, slow to respond to questions

## 2019-07-23 NOTE — ED Notes (Signed)
PT walked out of room when advised to perform blood draw, PT then sat back down, but removed tourniquet and walked to the bathroom.

## 2019-07-24 ENCOUNTER — Inpatient Hospital Stay (HOSPITAL_COMMUNITY)
Admit: 2019-07-24 | Discharge: 2019-08-01 | DRG: 885 | Disposition: A | Discharge status: Home / Self Care | Payer: Federal, State, Local not specified - Other | Attending: Psychiatry | Admitting: Psychiatry

## 2019-07-24 DIAGNOSIS — M25471 Effusion, right ankle: Secondary | ICD-10-CM | POA: Diagnosis present

## 2019-07-24 DIAGNOSIS — F17210 Nicotine dependence, cigarettes, uncomplicated: Secondary | ICD-10-CM | POA: Diagnosis present

## 2019-07-24 DIAGNOSIS — R52 Pain, unspecified: Secondary | ICD-10-CM

## 2019-07-24 DIAGNOSIS — F22 Delusional disorders: Secondary | ICD-10-CM | POA: Diagnosis present

## 2019-07-24 DIAGNOSIS — Z9114 Patient's other noncompliance with medication regimen: Secondary | ICD-10-CM

## 2019-07-24 DIAGNOSIS — F3160 Bipolar disorder, current episode mixed, unspecified: Secondary | ICD-10-CM | POA: Diagnosis present

## 2019-07-24 DIAGNOSIS — M25571 Pain in right ankle and joints of right foot: Secondary | ICD-10-CM | POA: Diagnosis present

## 2019-07-24 DIAGNOSIS — R000 Tachycardia, unspecified: Secondary | ICD-10-CM | POA: Diagnosis present

## 2019-07-24 DIAGNOSIS — G4700 Insomnia, unspecified: Secondary | ICD-10-CM | POA: Diagnosis present

## 2019-07-24 DIAGNOSIS — Z79899 Other long term (current) drug therapy: Secondary | ICD-10-CM | POA: Diagnosis not present

## 2019-07-24 DIAGNOSIS — R251 Tremor, unspecified: Secondary | ICD-10-CM | POA: Diagnosis present

## 2019-07-24 DIAGNOSIS — F319 Bipolar disorder, unspecified: Secondary | ICD-10-CM | POA: Diagnosis present

## 2019-07-24 MED ORDER — MAGNESIUM HYDROXIDE 400 MG/5ML PO SUSP
30.0000 mL | Freq: Every day | ORAL | Status: DC | PRN
  Administered 2019-07-26: 30 mL via ORAL

## 2019-07-24 MED ORDER — ACETAMINOPHEN 325 MG PO TABS
650.0000 mg | ORAL_TABLET | Freq: Four times a day (QID) | ORAL | Status: DC | PRN
  Administered 2019-07-29 – 2019-07-31 (×5): 650 mg via ORAL
  Filled 2019-07-24 (×5): qty 2

## 2019-07-24 MED ORDER — LORAZEPAM 1 MG PO TABS
1.0000 mg | ORAL_TABLET | Freq: Once | ORAL | Status: AC
  Administered 2019-07-24: 1 mg via ORAL
  Filled 2019-07-24: qty 1

## 2019-07-24 MED ORDER — TRAZODONE HCL 50 MG PO TABS
50.0000 mg | ORAL_TABLET | Freq: Every evening | ORAL | Status: DC | PRN
  Administered 2019-07-25 – 2019-07-31 (×7): 50 mg via ORAL
  Filled 2019-07-24 (×7): qty 1
  Filled 2019-07-24: qty 7

## 2019-07-24 MED ORDER — HYDROXYZINE HCL 25 MG PO TABS
25.0000 mg | ORAL_TABLET | Freq: Three times a day (TID) | ORAL | Status: DC | PRN
  Administered 2019-07-25 – 2019-07-31 (×7): 25 mg via ORAL
  Filled 2019-07-24 (×6): qty 1
  Filled 2019-07-24: qty 10
  Filled 2019-07-24 (×2): qty 1

## 2019-07-24 MED ORDER — RISPERIDONE 1 MG PO TBDP
1.0000 mg | ORAL_TABLET | Freq: Once | ORAL | Status: DC
  Filled 2019-07-24 (×2): qty 1

## 2019-07-24 MED ORDER — ALUM & MAG HYDROXIDE-SIMETH 200-200-20 MG/5ML PO SUSP: 30.0000 mL | ORAL | Status: DC | PRN

## 2019-07-24 MED ORDER — DIPHENHYDRAMINE HCL 25 MG PO CAPS
50.0000 mg | ORAL_CAPSULE | Freq: Once | ORAL | Status: DC
  Filled 2019-07-24: qty 2

## 2019-07-24 MED ORDER — PALIPERIDONE ER 3 MG PO TB24
3.0000 mg | ORAL_TABLET | Freq: Every day | ORAL | Status: DC
  Administered 2019-07-24: 3 mg via ORAL
  Filled 2019-07-24: qty 1

## 2019-07-24 NOTE — ED Notes (Signed)
Pt is refusing benadryl. Pt is repetitively leaving her room and trying to walk around the hospital. Pt states she has to go to the bathroom every other min. Pt is not sleeping and sitting up in bed or standing in room looking around.

## 2019-07-24 NOTE — BH Assessment (Signed)
[facility] Assessment Progress Note  Per Nanine Means, DNP, this pt requires psychiatric hospitalization at this time.  Heather, RN has assigned pt to [facility] Rm 500-2; [facility] will be ready to receive pt at 21:00.  Pt presents under IVC initiated by EDP Raeford Razor, MD, and IVC documents have been faxed to [facility].  Pt's nurse has been notified, and agrees to call report to [phone].  Pt is to be transported via [employer].   Doylene Canning, [place] [facility] Coordinator [phone]

## 2019-07-24 NOTE — ED Provider Notes (Signed)
Emergency Medicine Observation Re-evaluation Note  [name] is a 39 y.o. female, seen on rounds today.  Pt initially presented to the ED for complaints of Paranoid and Delusional Currently, the patient is resting comfortably..  Physical Exam  BP 128/88 (BP Location: Left Arm)   Pulse (!) 115   Temp 98.1 F (36.7 C) (Oral)   Resp 20   SpO2 98%  Physical Exam  ED Course / MDM  EKG:    I have reviewed the labs performed to date as well as medications administered while in observation.  Recent changes in the last 24 hours include TTS evaluation.. Plan  Current plan is for bed search inpatient. Patient is not under full IVC at this time.   Terrilee Files, MD 07/24/19 [phone]

## 2019-07-24 NOTE — ED Notes (Addendum)
Pt up in room and anxious. Asking to use the phone to call as soon as this RN arrived into the department. Per sitter pt has been using the phone frequently. Pt was advised that she could use the unit phone one more time at this time.

## 2019-07-24 NOTE — ED Notes (Signed)
[facility] arrived to transport pt to [facility]. She is anxious but stable at the time of transport.

## 2019-07-25 ENCOUNTER — Encounter (HOSPITAL_COMMUNITY): Payer: Self-pay | Admitting: Psychiatry

## 2019-07-25 ENCOUNTER — Other Ambulatory Visit: Payer: Self-pay

## 2019-07-25 DIAGNOSIS — F319 Bipolar disorder, unspecified: Secondary | ICD-10-CM

## 2019-07-25 LAB — LIPID PANEL
Cholesterol: 140 mg/dL (ref 0–200)
HDL: 43 mg/dL (ref >40)
LDL Cholesterol: 80 mg/dL (ref 0–99)
Total CHOL/HDL Ratio: 3.3 RATIO
Triglycerides: 84 mg/dL (ref <150)
VLDL: 17 mg/dL (ref 0–40)

## 2019-07-25 LAB — TSH: TSH: 3.023 u[IU]/mL (ref 0.350–4.500)

## 2019-07-25 MED ORDER — CARBAMAZEPINE 100 MG PO CHEW
100.0000 mg | CHEWABLE_TABLET | Freq: Every day | ORAL | Status: DC
  Administered 2019-07-25 – 2019-08-01 (×8): 100 mg via ORAL
  Filled 2019-07-25 (×3): qty 1
  Filled 2019-07-25: qty 7
  Filled 2019-07-25 (×9): qty 1

## 2019-07-25 MED ORDER — RISPERIDONE 2 MG PO TBDP
2.0000 mg | ORAL_TABLET | Freq: Two times a day (BID) | ORAL | Status: DC
  Administered 2019-07-25 (×2): 2 mg via ORAL
  Filled 2019-07-25 (×3): qty 1
  Filled 2019-07-25: qty 2
  Filled 2019-07-25 (×2): qty 1

## 2019-07-25 MED ORDER — CARBAMAZEPINE 100 MG PO CHEW
200.0000 mg | CHEWABLE_TABLET | Freq: Every day | ORAL | Status: DC
  Administered 2019-07-25 – 2019-07-31 (×7): 200 mg via ORAL
  Filled 2019-07-25: qty 14
  Filled 2019-07-25 (×9): qty 2

## 2019-07-25 MED ORDER — PALIPERIDONE PALMITATE ER 234 MG/1.5ML IM SUSY
234.0000 mg | PREFILLED_SYRINGE | Freq: Once | INTRAMUSCULAR | Status: AC
  Administered 2019-07-25: 234 mg via INTRAMUSCULAR
  Filled 2019-07-25: qty 1.5

## 2019-07-25 MED ORDER — ALBUTEROL SULFATE HFA 108 (90 BASE) MCG/ACT IN AERS: 1.0000 | INHALATION_SPRAY | Freq: Four times a day (QID) | RESPIRATORY_TRACT | Status: DC | PRN

## 2019-07-25 MED ORDER — BENZTROPINE MESYLATE 1 MG PO TABS
1.0000 mg | ORAL_TABLET | Freq: Two times a day (BID) | ORAL | Status: DC | PRN
  Filled 2019-07-25: qty 10

## 2019-07-25 NOTE — [facility] Counselor (Signed)
CSW attempted to complete PSA with this patient, however this patient declined to complete assessment at the current time and requested to complete PSA at a later time.    Ruthann Cancer MSW, [employer] Clincal Social Worker  [facility]

## 2019-07-25 NOTE — Progress Notes (Signed)
Pt up refusing medication , pt stated she needed to talk to the doctor before she takes her medication. Pt doing same thing she was doing last time she was here

## 2019-07-25 NOTE — Progress Notes (Signed)
   07/25/19 1000  Psych Admission Type (Psych Patients Only)  Admission Status Involuntary  Psychosocial Assessment  Patient Complaints Anxiety;Suspiciousness  Eye Contact Intense  Facial Expression Anxious  Affect Anxious;Blunted  Speech Pressured  Interaction Assertive  Motor Activity Fidgety  Appearance/Hygiene In scrubs  Behavior Characteristics Appropriate to situation  Mood Anxious  Aggressive Behavior  Targets Self;Property;Family  Type of Behavior Striking out;Verbal  Effect No apparent injury  Thought Process  Coherency Blocking  Content Preoccupation  Delusions None reported or observed  Perception WDL  Hallucination None reported or observed  Judgment Poor  Confusion None  Danger to Self  Current suicidal ideation? Denies  Danger to Others  Danger to Others None reported or observed

## 2019-07-25 NOTE — Progress Notes (Signed)
[name] is a 39 y.o. female involuntary admitted for medication adjustment. Pt has been off her medications, pt stated that she needed to be evaluated. Pt has not been cooperative, refused to sign the admission paper work, refuses to answer question, at some point become  Sarcastic. Pt denied SI/HI, AVH, but at some point appeared to responding to internal stimuli. Pt skin search completed, walked to the unit, offered some to eat. Will continue to monitor.

## 2019-07-25 NOTE — [facility] Suicide Risk Assessment (Signed)
[facility] Admission Suicide Risk Assessment   Nursing information obtained from:  Patient Demographic factors:  Caucasian Current Mental Status:  NA Loss Factors:  Decrease in vocational status Historical Factors:  NA Risk Reduction Factors:  Living with another person, especially a relative  Total Time spent with patient: 30 minutes Principal Problem: <principal problem not specified> Diagnosis:  Active Problems:   Bipolar 1 disorder (HCC)  Subjective Data: Patient is seen and examined.  Patient is a 39 year old female who presented to the [facility] emergency department on voluntary basis with apparently somatic delusions.  She lives with her mother in [place].  She has been previously diagnosed with bipolar disorder.  She has not been going to see the outpatient psychiatrist and has not been taking her medications.  Her last admission was in October 2020.  She stated in the emergency department she was concerned about arsenic on her skin.  She stated she had arsenic on her skin for months.  She wanted to get help getting the arsenic off her skin.  She denied any homicidal ideation suicidal ideation.  On examination this morning she really refuses to answer any questions.  She stated that she does needs "alone time to get my thoughts together".  She would not answer questions with regard to follow-up, compliance with medications, auditory or visual hallucinations or homicidal or suicidal ideation.  She was admitted to the hospital for evaluation and stabilization.  I actually admitted the patient on 11/18/2018.  At that time she had been not eating, not sleeping or attending to her hygiene.  She began going to peoples houses at 3:00 in the morning.  She had also gone back to taking Adderall.  She was hospitalized for 6 days, and her discharge medications included Cogentin, Tegretol, paliperidone, Risperdal.  The paliperidone was the long-acting injectable form.  Continued Clinical Symptoms:   Alcohol Use Disorder Identification Test Final Score (AUDIT): 0 The "Alcohol Use Disorders Identification Test", Guidelines for Use in Primary Care, Second Edition.  World Science writer [facility]). Score between 0-7:  no or low risk or alcohol related problems. Score between 8-15:  moderate risk of alcohol related problems. Score between 16-19:  high risk of alcohol related problems. Score 20 or above:  warrants further diagnostic evaluation for alcohol dependence and treatment.   CLINICAL FACTORS:   Bipolar Disorder:   Mixed State Schizophrenia:   Less than 78 years old Paranoid or undifferentiated type   Musculoskeletal: Strength & Muscle Tone: within normal limits Gait & Station: normal Patient leans: N/A  Psychiatric Specialty Exam: Physical Exam Vitals and nursing note reviewed.  Constitutional:      Appearance: Normal appearance.  HENT:     Head: Normocephalic and atraumatic.  Pulmonary:     Effort: Pulmonary effort is normal.  Neurological:     General: No focal deficit present.     Mental Status: She is alert and oriented to person, place, and time.     Review of Systems  Blood pressure 106/75, pulse 100, temperature (!) 97.5 F (36.4 C), temperature source Oral, resp. rate 20, height 5\' 9"  (1.753 m), weight 116.1 kg, SpO2 100 %.Body mass index is 37.8 kg/m.  General Appearance: Casual  Eye Contact:  Fair  Speech:  Normal Rate  Volume:  Normal  Mood:  Anxious  Affect:  Flat  Thought Process:  Goal Directed and Descriptions of Associations: Circumstantial  Orientation:  Negative  Thought Content:  Delusions and Paranoid Ideation  Suicidal Thoughts:  No  Homicidal Thoughts:  No  Memory:  Immediate;   Poor Recent;   Poor Remote;   Poor  Judgement:  Impaired  Insight:  Lacking  Psychomotor Activity:  Increased  Concentration:  Concentration: Fair and Attention Span: Fair  Recall:  [employer] of Knowledge:  Fair  Language:  Fair  Akathisia:  Negative   Handed:  Right  AIMS (if indicated):     Assets:  Desire for Improvement Resilience  ADL's:  Intact  Cognition:  WNL  Sleep:  Number of Hours: 5      COGNITIVE FEATURES THAT CONTRIBUTE TO RISK:  Thought constriction (tunnel vision)    SUICIDE RISK:   Moderate:  Frequent suicidal ideation with limited intensity, and duration, some specificity in terms of plans, no associated intent, good self-control, limited dysphoria/symptomatology, some risk factors present, and identifiable protective factors, including available and accessible social support.  PLAN OF CARE: Patient is seen and examined.  Patient is a 39 year old female with the above-stated past psychiatric history who is seen secondary to worsening paranoia, somatic delusions and noncompliance with medications.  She will be admitted to the hospital.  She will be integrated in the milieu.  She will be encouraged to attend groups.  We will restart her Cogentin 1 mg p.o. twice daily, Tegretol 100 mg p.o. every morning and 200 mg p.o. nightly, she will receive the Risperdal but we will started 2 mg p.o. twice daily and titrate that.  She will also receive the long-acting paliperidone injection 234 mg today.  Review of her admission laboratories revealed essentially normal electrolytes including creatinine and liver function enzymes.  Her lipid panel was normal.  CBC was normal.  Glucose was 97.  TSH was 3.023.  Urinalysis showed many bacteria, 6-6 10 squamous epithelial cells, and 0-5 white blood cells.  We will repeat that.  Blood alcohol was less than 10.  Drug screen was negative.  Beta-hCG was negative.  I certify that inpatient services furnished can reasonably be expected to improve the patient's condition.   Antonieta Pert, MD 07/25/2019, 10:03 AM

## 2019-07-25 NOTE — Tx Team (Signed)
Initial Treatment Plan 07/25/2019 1:29 AM [name] MOQ:[id number]    PATIENT STRESSORS: Financial difficulties Medication change or noncompliance   PATIENT STRENGTHS: Physical Health Supportive family/friends   PATIENT IDENTIFIED PROBLEMS: Anxiety  Depression  Focus on being healthy"  "coping"               DISCHARGE CRITERIA:  Ability to meet basic life and health needs Improved stabilization in mood, thinking, and/or behavior Motivation to continue treatment in a less acute level of care  PRELIMINARY DISCHARGE PLAN: Attend aftercare/continuing care group Attend PHP/IOP Outpatient therapy  PATIENT/FAMILY INVOLVEMENT: This treatment plan has been presented to and reviewed with the patient, [name], and/or family member.  The patient and family have been given the opportunity to ask questions and make suggestions.  Bethann Punches, RN 07/25/2019, 1:29 AM

## 2019-07-25 NOTE — Progress Notes (Signed)
Pt continues to express her paranoia about various things"I don't trust that water in the pitcher"    07/25/19 2000  Psych Admission Type (Psych Patients Only)  Admission Status Other (Comment)  Psychosocial Assessment  Patient Complaints Anxiety  Eye Contact Intense  Facial Expression Anxious  Affect Anxious;Blunted  Speech Pressured  Interaction Assertive  Motor Activity Fidgety  Appearance/Hygiene In scrubs  Behavior Characteristics Cooperative  Mood Anxious  Aggressive Behavior  Targets Self;Property;Family  Type of Behavior Striking out;Verbal  Effect No apparent injury  Thought Process  Coherency Blocking  Content Preoccupation  Delusions None reported or observed  Perception WDL  Hallucination None reported or observed  Judgment Poor  Confusion None  Danger to Self  Current suicidal ideation? Denies  Danger to Others  Danger to Others None reported or observed

## 2019-07-25 NOTE — H&P (Signed)
Psychiatric Admission Assessment Adult  Patient Identification: [name] MRN:  [id number] Date of Evaluation:  07/25/2019 Chief Complaint:  Bipolar 1 disorder (HCC) [F31.9] Principal Diagnosis: <principal problem not specified> Diagnosis:  Active Problems:   Bipolar 1 disorder (HCC)  History of Present Illness: Patient is seen and examined.  Patient is a 39 year old female who presented to the [facility] emergency department on voluntary basis with apparently somatic delusions.  She lives with her mother in [place].  She has been previously diagnosed with bipolar disorder.  She has not been going to see the outpatient psychiatrist and has not been taking her medications.  Her last admission was in October 2020.  She stated in the emergency department she was concerned about arsenic on her skin.  She stated she had arsenic on her skin for months.  She wanted to get help getting the arsenic off her skin.  She denied any homicidal ideation suicidal ideation.  On examination this morning she really refuses to answer any questions.  She stated that she does needs "alone time to get my thoughts together".  She would not answer questions with regard to follow-up, compliance with medications, auditory or visual hallucinations or homicidal or suicidal ideation.  She was admitted to the hospital for evaluation and stabilization.  I actually admitted the patient on 11/18/2018.  At that time she had been not eating, not sleeping or attending to her hygiene.  She began going to peoples houses at 3:00 in the morning.  She had also gone back to taking Adderall.  She was hospitalized for 6 days, and her discharge medications included Cogentin, Tegretol, paliperidone, Risperdal.  The paliperidone was the long-acting injectable form.  Associated Signs/Symptoms: Depression Symptoms:  depressed mood, anhedonia, insomnia, psychomotor agitation, feelings of worthlessness/guilt, hopelessness, suicidal thoughts  without plan, anxiety, loss of energy/fatigue, disturbed sleep, (Hypo) Manic Symptoms:  Delusions, Impulsivity, Irritable Mood, Labiality of Mood, Anxiety Symptoms:  Excessive Worry, Psychotic Symptoms:  Delusions, Paranoia, PTSD Symptoms: Negative Total Time spent with patient: 45 minutes  Past Psychiatric History: Patient has had several psychiatric hospitalizations in the past.  Her most recent at our facility was on 11/18/2018.  She has been diagnosed with bipolar disorder.  She also says she has attention deficit hyperactivity disorder.  Is the patient at risk to self? Yes.    Has the patient been a risk to self in the past 6 months? Yes.    Has the patient been a risk to self within the distant past? Yes.    Is the patient a risk to others? No.  Has the patient been a risk to others in the past 6 months? No.  Has the patient been a risk to others within the distant past? No.   Prior Inpatient Therapy:   Prior Outpatient Therapy:    Alcohol Screening: 1. How often do you have a drink containing alcohol?: Never 2. How many drinks containing alcohol do you have on a typical day when you are drinking?: 1 or 2 3. How often do you have six or more drinks on one occasion?: Never AUDIT-C Score: 0 4. How often during the last year have you found that you were not able to stop drinking once you had started?: Never 5. How often during the last year have you failed to do what was normally expected from you because of drinking?: Never 6. How often during the last year have you needed a first drink in the morning to get yourself going after  a heavy drinking session?: Never 7. How often during the last year have you had a feeling of guilt of remorse after drinking?: Never 9. Have you or someone else been injured as a result of your drinking?: No 10. Has a relative or friend or a doctor or another health worker been concerned about your drinking or suggested you cut down?: No Alcohol Use  Disorder Identification Test Final Score (AUDIT): 0 Alcohol Brief Interventions/Follow-up: AUDIT Score <7 follow-up not indicated Substance Abuse History in the last 12 months:  No. Consequences of Substance Abuse: Negative Previous Psychotropic Medications: Yes  Psychological Evaluations: Yes  Past Medical History:  Past Medical History:  Diagnosis Date  . ADD 10/22/2009  . ALLERGIC RHINITIS 09/26/2008  . Anemia   . ANXIETY 09/26/2008  . Asthma   . Complication of anesthesia    "tempermental" per patient, "aggiated and hostile"  . CONSTIPATION 09/26/2008  . DEPRESSION 09/26/2008  . FATIGUE 01/14/2009  . Head injury with loss of consciousness (HCC)   . Headache   . HYPERLIPIDEMIA 09/26/2008  . IBS (irritable bowel syndrome)   . OTITIS MEDIA, LEFT 01/14/2009  . SKIN LESION 09/26/2008  . Sleep apnea   . TONSILLITIS, ACUTE 01/14/2009  . Unconscious state (HCC) 10/13/2017  . URI 10/22/2009    Past Surgical History:  Procedure Laterality Date  . CERVICAL POLYPECTOMY  2009  . CERVICAL POLYPECTOMY  2019  . DILATATION & CURETTAGE/HYSTEROSCOPY WITH MYOSURE N/A 11/17/2017   Procedure: DILATATION & CURETTAGE/HYSTEROSCOPY WITH MYOSURE;  Surgeon: Geryl Rankins, MD;  Location: [facility];  Service: Gynecology;  Laterality: N/A;  . ORIF ANKLE FRACTURE Right 08/24/2018   Procedure: OPEN REDUCTION INTERNAL FIXATION TRIMALLEOLAR ANKLE FRACTURE;  Surgeon: Eldred Manges, MD;  Location: [facility] OR;  Service: Orthopedics;  Laterality: Right;   Family History:  Family History  Problem Relation Age of Onset  . Diabetes Other   . Hypertension Other   . Thyroid disease Mother   . Hypertension Mother    Family Psychiatric  History: Noncontributory Tobacco Screening: Have you used any form of tobacco in the last 30 days? (Cigarettes, Smokeless Tobacco, Cigars, and/or Pipes): Yes Tobacco use, Select all that apply: 5 or more cigarettes per day Are you interested in Tobacco Cessation Medications?: Yes, will notify MD  for an order Counseled patient on smoking cessation including recognizing danger situations, developing coping skills and basic information about quitting provided: Yes Social History:  Social History   Substance and Sexual Activity  Alcohol Use Not Currently     Social History   Substance and Sexual Activity  Drug Use No    Additional Social History:      Pain Medications: See MAR Prescriptions: See MAR Over the Counter: See MAR History of alcohol / drug use?: No history of alcohol / drug abuse                    Allergies:  No Known Allergies Lab Results:  Results for orders placed or performed during the hospital encounter of 07/24/19 (from the past 48 hour(s))  Lipid panel     Status: None   Collection Time: 07/25/19  6:30 AM  Result Value Ref Range   Cholesterol 140 0 - 200 mg/dL   Triglycerides 84 <712 mg/dL   HDL 43 >45 mg/dL   Total CHOL/HDL Ratio 3.3 RATIO   VLDL 17 0 - 40 mg/dL   LDL Cholesterol 80 0 - 99 mg/dL    Comment:  Total Cholesterol/HDL:CHD Risk Coronary Heart Disease Risk Table                     Men   Women  1/2 Average Risk   3.4   3.3  Average Risk       5.0   4.4  2 X Average Risk   9.6   7.1  3 X Average Risk  23.4   11.0        Use the calculated Patient Ratio above and the CHD Risk Table to determine the patient's CHD Risk.        ATP III CLASSIFICATION (LDL):  <100     mg/dL   Optimal  673-419  mg/dL   Near or Above                    Optimal  130-159  mg/dL   Borderline  379-024  mg/dL   High  >097     mg/dL   Very High Performed at [facility], [address], [place], [place] [address]   TSH     Status: None   Collection Time: 07/25/19  6:30 AM  Result Value Ref Range   TSH 3.023 0.350 - 4.500 uIU/mL    Comment: Performed by a 3rd Generation assay with a functional sensitivity of <=0.01 uIU/mL. Performed at [facility], [address], [place], [place] [address]      Blood Alcohol level:  Lab Results  Component Value Date   ETH <10 07/23/2019   ETH <10 11/14/2018    Metabolic Disorder Labs:  Lab Results  Component Value Date   HGBA1C 5.1 05/26/2018   MPG 99.67 05/26/2018   No results found for: PROLACTIN Lab Results  Component Value Date   CHOL 140 07/25/2019   TRIG 84 07/25/2019   HDL 43 07/25/2019   CHOLHDL 3.3 07/25/2019   VLDL 17 07/25/2019   LDLCALC 80 07/25/2019   LDLCALC 71 05/26/2018    Current Medications: Current Facility-Administered Medications  Medication Dose Route Frequency Provider Last Rate Last Admin  . acetaminophen (TYLENOL) tablet 650 mg  650 mg Oral Q6H PRN Nira Conn A, NP      . albuterol (VENTOLIN HFA) 108 (90 Base) MCG/ACT inhaler 1-2 puff  1-2 puff Inhalation Q6H PRN Antonieta Pert, MD      . alum & mag hydroxide-simeth (MAALOX/MYLANTA) 200-200-20 MG/5ML suspension 30 mL  30 mL Oral Q4H PRN Nira Conn A, NP      . benztropine (COGENTIN) tablet 1 mg  1 mg Oral BID PRN Antonieta Pert, MD      . carbamazepine (TEGRETOL) chewable tablet 100 mg  100 mg Oral Daily Antonieta Pert, MD   100 mg at 07/25/19 0802  . carbamazepine (TEGRETOL) chewable tablet 200 mg  200 mg Oral QHS Antonieta Pert, MD      . hydrOXYzine (ATARAX/VISTARIL) tablet 25 mg  25 mg Oral TID PRN Nira Conn A, NP      . magnesium hydroxide (MILK OF MAGNESIA) suspension 30 mL  30 mL Oral Daily PRN Nira Conn A, NP      . risperiDONE (RISPERDAL M-TABS) disintegrating tablet 1 mg  1 mg Oral Once Nira Conn A, NP      . risperiDONE (RISPERDAL M-TABS) disintegrating tablet 2 mg  2 mg Oral BID Antonieta Pert, MD   2 mg at 07/25/19 0802  . traZODone (DESYREL) tablet 50 mg  50 mg Oral QHS PRN Jackelyn Poling, NP       PTA Medications: Medications Prior to Admission  Medication Sig Dispense Refill Last Dose  . albuterol (PROVENTIL HFA;VENTOLIN HFA) 108 (90 Base) MCG/ACT inhaler Inhale 2 puffs into the lungs every 6 (six)  hours as needed for wheezing or shortness of breath. (Patient not taking: Reported on 05/26/2018) 1 Inhaler 2   . benztropine (COGENTIN) 1 MG tablet Take 1 tablet (1 mg total) by mouth 2 (two) times daily. (Patient not taking: Reported on 07/24/2019) 60 tablet 2   . carbamazepine (TEGRETOL) 100 MG chewable tablet 1 in am 2 at hs (Patient not taking: Reported on 07/24/2019) 90 tablet 2   . loratadine (CLARITIN) 10 MG tablet Take 10 mg by mouth daily as needed for allergies.     Marland Kitchen ondansetron (ZOFRAN ODT) 4 MG disintegrating tablet Take 1 tablet (4 mg total) by mouth every 8 (eight) hours as needed for nausea. (Patient not taking: Reported on 11/14/2018) 10 tablet 0   . paliperidone (INVEGA SUSTENNA) 156 MG/ML SUSY injection Due before 11/4 Then continue monthly w/ 234 mg (Patient not taking: Reported on 07/24/2019) 1 mL 0   . risperiDONE (RISPERDAL) 3 MG tablet Take 2 tablets (6 mg total) by mouth at bedtime. (Patient not taking: Reported on 07/24/2019) 60 tablet 2   . traMADol (ULTRAM) 50 MG tablet 1 po tid prn pain (Patient not taking: Reported on 11/14/2018) 30 tablet 0     Musculoskeletal: Strength & Muscle Tone: within normal limits Gait & Station: normal Patient leans: N/A  Psychiatric Specialty Exam: Physical Exam Vitals and nursing note reviewed.  Constitutional:      Appearance: Normal appearance.  HENT:     Head: Normocephalic and atraumatic.  Pulmonary:     Effort: Pulmonary effort is normal.  Neurological:     General: No focal deficit present.     Mental Status: She is alert and oriented to person, place, and time.     Review of Systems  Blood pressure 106/75, pulse 100, temperature (!) 97.5 F (36.4 C), temperature source Oral, resp. rate 20, height  (1.753 m), weight 116.1 kg, SpO2 100 %.Body mass index is 37.8 kg/m.  General Appearance: Casual  Eye Contact:  Fair  Speech:  Normal Rate  Volume:  Normal  Mood:  Dysphoric  Affect:  Flat  Thought Process:  Goal  Directed and Descriptions of Associations: Circumstantial  Orientation:  Negative  Thought Content:  Delusions, Paranoid Ideation and Rumination  Suicidal Thoughts:  No  Homicidal Thoughts:  No  Memory:  Immediate;   Poor Recent;   Poor Remote;   Poor  Judgement:  Impaired  Insight:  Lacking  Psychomotor Activity:  Decreased  Concentration:  Concentration: Fair and Attention Span: Fair  Recall:  [employer] of Knowledge:  Fair  Language:  Fair  Akathisia:  Negative  Handed:  Right  AIMS (if indicated):     Assets:  Desire for Improvement Resilience  ADL's:  Intact  Cognition:  WNL  Sleep:  Number of Hours: 5    Treatment Plan Summary: Daily contact with patient to assess and evaluate symptoms and progress in treatment, Medication management and Plan : Patient is seen and examined.  Patient is a 39 year old female with the above-stated past psychiatric history who is seen secondary to worsening paranoia, somatic delusions and noncompliance with medications.  She will be admitted to the hospital.  She will be integrated in the milieu.  She will be encouraged to attend groups.  We will restart her Cogentin 1 mg p.o. twice daily, Tegretol 100 mg p.o. every morning and 200 mg p.o. nightly, she will receive the Risperdal but we will started 2 mg p.o. twice daily and titrate that.  She will also receive the long-acting paliperidone injection 234 mg today.  Review of her admission laboratories revealed essentially normal electrolytes including creatinine and liver function enzymes.  Her lipid panel was normal.  CBC was normal.  Glucose was 97.  TSH was 3.023.  Urinalysis showed many bacteria, 6-6 10 squamous epithelial cells, and 0-5 white blood cells.  We will repeat that.  Blood alcohol was less than 10.  Drug screen was negative.  Beta-hCG was negative.  Observation Level/Precautions:  15 minute checks  Laboratory:  Chemistry Profile  Psychotherapy:    Medications:    Consultations:     Discharge Concerns:    Estimated LOS:  Other:     Physician Treatment Plan for Primary Diagnosis: <principal problem not specified> Long Term Goal(s): Improvement in symptoms so as ready for discharge  Short Term Goals: Ability to identify changes in lifestyle to reduce recurrence of condition will improve, Ability to verbalize feelings will improve, Ability to demonstrate self-control will improve, Ability to identify and develop effective coping behaviors will improve, Ability to maintain clinical measurements within normal limits will improve and Compliance with prescribed medications will improve  Physician Treatment Plan for Secondary Diagnosis: Active Problems:   Bipolar 1 disorder (HCC)  Long Term Goal(s): Improvement in symptoms so as ready for discharge  Short Term Goals: Ability to identify changes in lifestyle to reduce recurrence of condition will improve, Ability to verbalize feelings will improve, Ability to demonstrate self-control will improve, Ability to identify and develop effective coping behaviors will improve, Ability to maintain clinical measurements within normal limits will improve and Compliance with prescribed medications will improve  I certify that inpatient services furnished can reasonably be expected to improve the patient's condition.    Antonieta Pert, MD 6/29/20211:25 PM

## 2019-07-25 NOTE — Progress Notes (Signed)
Psychoeducational Group Note  Date:  07/25/2019 Time:  2029  Group Topic/Focus:  Wrap-Up Group:   The focus of this group is to help patients review their daily goal of treatment and discuss progress on daily workbooks.  Participation Level: Did Not Attend  Participation Quality:  Not Applicable  Affect:  Not Applicable  Cognitive:  Not Applicable  Insight:  Not Applicable  Engagement in Group: Not Applicable  Additional Comments:  The patient did not attend group this evening.    Hazle Coca S 07/25/2019, 8:29 PM

## 2019-07-26 LAB — HEMOGLOBIN A1C
Hgb A1c MFr Bld: 5.4 % (ref 4.8–5.6)
Mean Plasma Glucose: 108 mg/dL

## 2019-07-26 MED ORDER — RISPERIDONE 2 MG PO TBDP
4.0000 mg | ORAL_TABLET | Freq: Every day | ORAL | Status: DC
  Administered 2019-07-26 – 2019-07-31 (×6): 4 mg via ORAL
  Filled 2019-07-26 (×6): qty 2
  Filled 2019-07-26: qty 14
  Filled 2019-07-26 (×2): qty 2

## 2019-07-26 MED ORDER — RISPERIDONE 2 MG PO TBDP
2.0000 mg | ORAL_TABLET | Freq: Every day | ORAL | Status: DC
  Administered 2019-07-26 – 2019-08-01 (×7): 2 mg via ORAL
  Filled 2019-07-26 (×5): qty 1
  Filled 2019-07-26: qty 7
  Filled 2019-07-26 (×3): qty 1
  Filled 2019-07-26: qty 2
  Filled 2019-07-26: qty 1

## 2019-07-26 MED ORDER — RISPERIDONE 1 MG PO TBDP
3.0000 mg | ORAL_TABLET | Freq: Every day | ORAL | Status: DC
  Filled 2019-07-26: qty 3

## 2019-07-26 NOTE — Progress Notes (Signed)
Pt continues to be suspicious and paranoid on the unit. Pt initially refused her medication stated"I just don't want to take them, just because" . Pt informed that could keep her here longer if she refuses to take her medication. Pt encouraged to discuss her issues with the doctor.     07/26/19 2100  Psych Admission Type (Psych Patients Only)  Admission Status Involuntary  Psychosocial Assessment  Patient Complaints Anxiety;Irritability  Eye Contact Intense  Facial Expression Anxious;Pained  Affect Anxious;Preoccupied  Speech Logical/coherent;Pressured  Interaction Cautious;Forwards little;Guarded  Motor Activity Fidgety;Restless  Appearance/Hygiene Improved  Behavior Characteristics Unwilling to participate  Mood Anxious;Suspicious  Thought Process  Coherency Blocking  Content Preoccupation;Paranoia  Delusions Paranoid  Perception Hallucinations  Hallucination Auditory  Judgment Poor  Confusion None  Danger to Self  Current suicidal ideation? Denies  Danger to Others  Danger to Others None reported or observed

## 2019-07-26 NOTE — [facility] Group Notes (Signed)
LCSW Group Therapy Notes  Type of Therapy and Topic: Group Therapy: Healthy Vs. Unhealthy Coping Strategies  Date and Time: 07/26/19 at 1:30pm  Participation Level: [facility] PARTICIPATION LEVEL: Did Not Attend  Description of Group:  In this group, patients will be encouraged to explore their healthy and unhealthy coping strategics. Coping strategies are actions that we take to deal with stress, problems, or uncomfortable emotions in our daily lives. Each patient will be challenged to read some scenarios and discuss the unhealthy and healthy coping strategies within those scenarios. Also, each patient will be challenged to describe current healthy and unhealthy strategies that they use in their own lives and discuss the outcomes and barriers to those strategies. This group will be process-oriented, with patients participating in exploration of their own experiences as well as giving and receiving support and challenge from other group members.  Therapeutic Goals: 1. Patient will identify personal healthy and unhealthy coping strategies. 2. Patient will identify healthy and unhealthy coping strategies, in others, through scenarios.  3. Patient will identify expected outcomes of healthy and unhealthy coping strategies. 4. Patient will identify barriers to using healthy coping strategies.   Summary of Patient Progress:  This patient was invited to attend group personally by CSW, however chose not to attend.  Therapeutic Modalities:  Cognitive Behavioral Therapy Solution Focused Therapy Motivational Interviewing    Kasheem Toner MSW, LCSWA Clincal Social Worker  [facility]  

## 2019-07-26 NOTE — Progress Notes (Signed)
[facility]  07/26/2019 11:39 AM [name]  MRN:  [id number] Subjective: Patient is a [age] year old female with a past psychiatric history significant for reported bipolar disorder who was involuntarily committed after noncompliance with medications and developing somatic delusions.  Objective: Patient is seen and examined.  Patient is a [age] year old female with the above-stated past psychiatric history who is seen in follow-up.  She still remains very bizarre and paranoid.  During the interview today she appeared to be responding to internal stimuli.  She was staring off to the corner.  It is unclear exactly what it was because she was resistant to answering those questions.  She decided to cut the interview off early.  She does appear to be significantly paranoid.  She is very resistant to treatment.  Nursing notes stated that she was compliant with medication administration, but stated "I want to talk about it".  It was noted that she remains rather isolated and reclusive in her room.  Her vital signs are stable, she is afebrile.  She only slept 4.25 hours last night.  Hemoglobin A1c from 6/29 was 5.4.  TSH was 3.023.  Principal Problem: <principal problem not specified> Diagnosis: Active Problems:   Bipolar 1 disorder (HCC)  Total Time spent with patient: 20 minutes  Past Psychiatric History: See admission H&P  Past Medical History:  Past Medical History:  Diagnosis Date  . ADD 10/22/2009  . ALLERGIC RHINITIS 09/26/2008  . Anemia   . ANXIETY 09/26/2008  . Asthma   . Complication of anesthesia    "tempermental" per patient, "aggiated and hostile"  . CONSTIPATION 09/26/2008  . DEPRESSION 09/26/2008  . FATIGUE 01/14/2009  . Head injury with loss of consciousness (HCC)   . Headache   . HYPERLIPIDEMIA 09/26/2008  . IBS (irritable bowel syndrome)   . OTITIS MEDIA, LEFT 01/14/2009  . SKIN LESION 09/26/2008  . Sleep apnea   . TONSILLITIS, ACUTE 01/14/2009  . Unconscious state (HCC)  10/13/2017  . URI 10/22/2009    Past Surgical History:  Procedure Laterality Date  . CERVICAL POLYPECTOMY  2009  . CERVICAL POLYPECTOMY  2019  . DILATATION & CURETTAGE/HYSTEROSCOPY WITH MYOSURE N/A 11/17/2017   Procedure: DILATATION & CURETTAGE/HYSTEROSCOPY WITH MYOSURE;  Surgeon: Geryl RankinsVarnado, Evelyn, MD;  Location: [facility];  Service: Gynecology;  Laterality: N/A;  . ORIF ANKLE FRACTURE Right 08/24/2018   Procedure: OPEN REDUCTION INTERNAL FIXATION TRIMALLEOLAR ANKLE FRACTURE;  Surgeon: Eldred MangesYates, Mark C, MD;  Location: [facility] OR;  Service: Orthopedics;  Laterality: Right;   Family History:  Family History  Problem Relation Age of Onset  . Diabetes Other   . Hypertension Other   . Thyroid disease Mother   . Hypertension Mother    Family Psychiatric  History: See admission H&P Social History:  Social History   Substance and Sexual Activity  Alcohol Use Not Currently     Social History   Substance and Sexual Activity  Drug Use No    Social History   Socioeconomic History  . Marital status: Single    Spouse name: Not on file  . Number of children: Not on file  . Years of education: Not on file  . Highest education level: Not on file  Occupational History  . Occupation: full time waitress  Tobacco Use  . Smoking status: Current Every Day Smoker    Packs/day: 1.00    Years: 20.00    Pack years: 20.00    Types: Cigarettes  . Smokeless tobacco: Never Used  Vaping  Use  . Vaping Use: Every day  Substance and Sexual Activity  . Alcohol use: Not Currently  . Drug use: No  . Sexual activity: Not Currently    Birth control/protection: Abstinence  Other Topics Concern  . Not on file  Social History Narrative   Lives with mother   Social Determinants of Health   Financial Resource Strain:   . Difficulty of Paying Living Expenses:   Food Insecurity:   . Worried About Programme researcher, broadcasting/film/video in the Last Year:   . Barista in the Last Year:   Transportation Needs:   . Automotive engineer (Medical):   Marland Kitchen Lack of Transportation (Non-Medical):   Physical Activity:   . Days of Exercise per Week:   . Minutes of Exercise per Session:   Stress:   . Feeling of Stress :   Social Connections:   . Frequency of Communication with Friends and Family:   . Frequency of Social Gatherings with Friends and Family:   . Attends Religious Services:   . Active Member of Clubs or Organizations:   . Attends Banker Meetings:   Marland Kitchen Marital Status:    Additional Social History:    Pain Medications: See MAR Prescriptions: See MAR Over the Counter: See MAR History of alcohol / drug use?: No history of alcohol / drug abuse                    Sleep: Fair  Appetite:  Fair  Current Medications: Current Facility-Administered Medications  Medication Dose Route Frequency Provider Last Rate Last Admin  . acetaminophen (TYLENOL) tablet 650 mg  650 mg Oral Q6H PRN Nira Conn A, NP      . albuterol (VENTOLIN HFA) 108 (90 Base) MCG/ACT inhaler 1-2 puff  1-2 puff Inhalation Q6H PRN Antonieta Pert, MD      . alum & mag hydroxide-simeth (MAALOX/MYLANTA) 200-200-20 MG/5ML suspension 30 mL  30 mL Oral Q4H PRN Nira Conn A, NP      . benztropine (COGENTIN) tablet 1 mg  1 mg Oral BID PRN Antonieta Pert, MD      . carbamazepine (TEGRETOL) chewable tablet 100 mg  100 mg Oral Daily Antonieta Pert, MD   100 mg at 07/26/19 0751  . carbamazepine (TEGRETOL) chewable tablet 200 mg  200 mg Oral QHS Antonieta Pert, MD   200 mg at 07/25/19 2056  . hydrOXYzine (ATARAX/VISTARIL) tablet 25 mg  25 mg Oral TID PRN Jackelyn Poling, NP   25 mg at 07/25/19 2056  . magnesium hydroxide (MILK OF MAGNESIA) suspension 30 mL  30 mL Oral Daily PRN Nira Conn A, NP   30 mL at 07/26/19 0751  . risperiDONE (RISPERDAL M-TABS) disintegrating tablet 1 mg  1 mg Oral Once Nira Conn A, NP      . risperiDONE (RISPERDAL M-TABS) disintegrating tablet 2 mg  2 mg Oral Daily Antonieta Pert, MD   2 mg at 07/26/19 0751  . risperiDONE (RISPERDAL M-TABS) disintegrating tablet 3 mg  3 mg Oral QHS Antonieta Pert, MD      . traZODone (DESYREL) tablet 50 mg  50 mg Oral QHS PRN Jackelyn Poling, NP   50 mg at 07/25/19 2056    Lab Results:  Results for orders placed or performed during the hospital encounter of 07/24/19 (from the past 48 hour(s))  Hemoglobin A1c     Status: None   Collection Time:  07/25/19  6:30 AM  Result Value Ref Range   Hgb A1c MFr Bld 5.4 4.8 - 5.6 %    Comment: (NOTE)         Prediabetes: 5.7 - 6.4         Diabetes: >6.4         Glycemic control for adults with diabetes: <7.0    Mean Plasma Glucose 108 mg/dL    Comment: (NOTE) Performed At: [facility] [address] [place], [place] [id number] Jolene Schimke MD YT:[id number]   Lipid panel     Status: None   Collection Time: 07/25/19  6:30 AM  Result Value Ref Range   Cholesterol 140 0 - 200 mg/dL   Triglycerides 84 <557 mg/dL   HDL 43 >32 mg/dL   Total CHOL/HDL Ratio 3.3 RATIO   VLDL 17 0 - 40 mg/dL   LDL Cholesterol 80 0 - 99 mg/dL    Comment:        Total Cholesterol/HDL:CHD Risk Coronary Heart Disease Risk Table                     Men   Women  1/2 Average Risk   3.4   3.3  Average Risk       5.0   4.4  2 X Average Risk   9.6   7.1  3 X Average Risk  23.4   11.0        Use the calculated Patient Ratio above and the CHD Risk Table to determine the patient's CHD Risk.        ATP III CLASSIFICATION (LDL):  <100     mg/dL   Optimal  202-542  mg/dL   Near or Above                    Optimal  130-159  mg/dL   Borderline  706-237  mg/dL   High  >628     mg/dL   Very High Performed at [facility], [address], [place], [place] [address]   TSH     Status: None   Collection Time: 07/25/19  6:30 AM  Result Value Ref Range   TSH 3.023 0.350 - 4.500 uIU/mL    Comment: Performed by a 3rd Generation assay with a functional sensitivity of <=0.01  uIU/mL. Performed at [facility], [address], [place], [place] [address]     Blood Alcohol level:  Lab Results  Component Value Date   ETH <10 07/23/2019   ETH <10 11/14/2018    Metabolic Disorder Labs: Lab Results  Component Value Date   HGBA1C 5.4 07/25/2019   MPG 108 07/25/2019   MPG 99.67 05/26/2018   No results found for: PROLACTIN Lab Results  Component Value Date   CHOL 140 07/25/2019   TRIG 84 07/25/2019   HDL 43 07/25/2019   CHOLHDL 3.3 07/25/2019   VLDL 17 07/25/2019   LDLCALC 80 07/25/2019   LDLCALC 71 05/26/2018    Physical Findings: AIMS: Facial and Oral Movements Muscles of Facial Expression: None, normal Lips and Perioral Area: None, normal Jaw: None, normal Tongue: None, normal,Extremity Movements Upper (arms, wrists, hands, fingers): None, normal Lower (legs, knees, ankles, toes): None, normal, Trunk Movements Neck, shoulders, hips: None, normal, Overall Severity Severity of abnormal movements (highest score from questions above): None, normal Incapacitation due to abnormal movements: None, normal Patient's awareness of abnormal movements (rate only patient's report): No Awareness, Dental Status Current  problems with teeth and/or dentures?: No Does patient usually wear dentures?: No  CIWA:  CIWA-Ar Total: 4 COWS:  COWS Total Score: 3  Musculoskeletal: Strength & Muscle Tone: within normal limits Gait & Station: normal Patient leans: N/A  Psychiatric Specialty Exam: Physical Exam Vitals and nursing note reviewed.  Constitutional:      Appearance: Normal appearance.  HENT:     Head: Normocephalic and atraumatic.  Pulmonary:     Effort: Pulmonary effort is normal.  Neurological:     General: No focal deficit present.     Mental Status: She is alert and oriented to person, place, and time.     Review of Systems  Blood pressure 127/60, pulse 99, temperature 98.5 F (36.9 C), temperature source Oral, resp. rate  20, height 5\' 9"  (1.753 m), weight 116.1 kg, SpO2 100 %.Body mass index is 37.8 kg/m.  General Appearance: Casual  Eye Contact:  Minimal  Speech:  Blocked  Volume:  Decreased  Mood:  Dysphoric and Irritable  Affect:  Labile  Thought Process:  Disorganized and Descriptions of Associations: Loose  Orientation:  Negative  Thought Content:  Hallucinations: Auditory and Paranoid Ideation  Suicidal Thoughts:  No  Homicidal Thoughts:  No  Memory:  Immediate;   Poor Recent;   Poor Remote;   Poor  Judgement:  Impaired  Insight:  Lacking  Psychomotor Activity:  Increased  Concentration:  Concentration: Fair and Attention Span: Fair  Recall:  of Knowledge:  Fair  Language:  Fair  Akathisia:  Negative  Handed:  Right  AIMS (if indicated):     Assets:  Desire for Improvement Housing Resilience  ADL's:  Intact  Cognition:  WNL  Sleep:  Number of Hours: 4.25     Treatment Plan Summary: Daily contact with patient to assess and evaluate symptoms and progress in treatment, Medication management and Plan : Patient is seen and examined.  Patient is a 39 year old female with the above-stated past psychiatric history who is seen in follow-up.   Diagnosis: 1.  Schizoaffective disorder; bipolar type versus bipolar disorder with psychotic features  Pertinent findings on examination today: 1.  Continued evidence of auditory or visual hallucinations secondary to internal stimuli. 2.  Continued paranoia. 3.  Continued poor sleep  Plan: 1.  Increase Risperdal to 2 mg p.o. daily and 4 mg p.o. nightly for mood stability and psychosis. 2.  Continue Cogentin 1 mg p.o. twice daily for tremors. 3.  Continue Tegretol 100 mg p.o. daily and 200 mg p.o. nightly for mood stability 4.  Disposition planning-in progress.  30, MD 07/26/2019, 11:39 AM

## 2019-07-26 NOTE — Plan of Care (Signed)
Progress note  D: pt found in bed; compliant with medication administration. On approach, pt states "I don't want to talk about it" when asked about their night. Pt does have complaints of constipation. Pt has been reclusive to their room most of the morning. Pt is pleasant. Pt denies si/hi/ah/vh and verbally agrees to approach staff if these become apparent or before harming themself/others while at [facility].  A: Pt provided support and encouragement. Pt given medication per protocol and standing orders. Q6m safety checks implemented and continued.  R: Pt safe on the unit. Will continue to monitor.  Pt progressing in the following metrics  Problem: Education: Goal: Knowledge of the prescribed therapeutic regimen will improve Outcome: Progressing   Problem: Activity: Goal: Interest or engagement in leisure activities will improve Outcome: Progressing Goal: Imbalance in normal sleep/wake cycle will improve Outcome: Progressing   Problem: Coping: Goal: Coping ability will improve Outcome: Progressing

## 2019-07-26 NOTE — [facility] Group Notes (Signed)
Adult Psychoeducational Group Note  Date:  07/26/2019 Time:  9:38 PM  Group Topic/Focus:  Wrap-Up Group:   The focus of this group is to help patients review their daily goal of treatment and discuss progress on daily workbooks.  Participation Level:  Active  Participation Quality:  Attentive  Affect:  Appropriate  Cognitive:  Alert  Insight: Good  Engagement in Group:  Engaged  Modes of Intervention:  Discussion  Additional Comments:    Jacalyn Lefevre 07/26/2019, 9:38 PM

## 2019-07-26 NOTE — Tx Team (Signed)
Interdisciplinary Treatment and Diagnostic Plan Update  07/26/2019 Time of Session: 9:10am [name] MRN: [id number]  Principal Diagnosis: <principal problem not specified>  Secondary Diagnoses: Active Problems:   Bipolar 1 disorder (HCC)   Current Medications:  Current Facility-Administered Medications  Medication Dose Route Frequency Provider Last Rate Last Admin  . acetaminophen (TYLENOL) tablet 650 mg  650 mg Oral Q6H PRN Lindon Romp A, NP      . albuterol (VENTOLIN HFA) 108 (90 Base) MCG/ACT inhaler 1-2 puff  1-2 puff Inhalation Q6H PRN Sharma Covert, MD      . alum & mag hydroxide-simeth (MAALOX/MYLANTA) 200-200-20 MG/5ML suspension 30 mL  30 mL Oral Q4H PRN Lindon Romp A, NP      . benztropine (COGENTIN) tablet 1 mg  1 mg Oral BID PRN Sharma Covert, MD      . carbamazepine (TEGRETOL) chewable tablet 100 mg  100 mg Oral Daily Sharma Covert, MD   100 mg at 07/26/19 0751  . carbamazepine (TEGRETOL) chewable tablet 200 mg  200 mg Oral QHS Sharma Covert, MD   200 mg at 07/25/19 2056  . hydrOXYzine (ATARAX/VISTARIL) tablet 25 mg  25 mg Oral TID PRN Rozetta Nunnery, NP   25 mg at 07/25/19 2056  . magnesium hydroxide (MILK OF MAGNESIA) suspension 30 mL  30 mL Oral Daily PRN Lindon Romp A, NP   30 mL at 07/26/19 0751  . risperiDONE (RISPERDAL M-TABS) disintegrating tablet 1 mg  1 mg Oral Once Lindon Romp A, NP      . risperiDONE (RISPERDAL M-TABS) disintegrating tablet 2 mg  2 mg Oral Daily Sharma Covert, MD   2 mg at 07/26/19 0751  . risperiDONE (RISPERDAL M-TABS) disintegrating tablet 3 mg  3 mg Oral QHS Sharma Covert, MD      . traZODone (DESYREL) tablet 50 mg  50 mg Oral QHS PRN Rozetta Nunnery, NP   50 mg at 07/25/19 2056   PTA Medications: Medications Prior to Admission  Medication Sig Dispense Refill Last Dose  . albuterol (PROVENTIL HFA;VENTOLIN HFA) 108 (90 Base) MCG/ACT inhaler Inhale 2 puffs into the lungs every 6 (six) hours as needed for  wheezing or shortness of breath. (Patient not taking: Reported on 05/26/2018) 1 Inhaler 2   . benztropine (COGENTIN) 1 MG tablet Take 1 tablet (1 mg total) by mouth 2 (two) times daily. (Patient not taking: Reported on 07/24/2019) 60 tablet 2   . carbamazepine (TEGRETOL) 100 MG chewable tablet 1 in am 2 at hs (Patient not taking: Reported on 07/24/2019) 90 tablet 2   . loratadine (CLARITIN) 10 MG tablet Take 10 mg by mouth daily as needed for allergies.     Marland Kitchen ondansetron (ZOFRAN ODT) 4 MG disintegrating tablet Take 1 tablet (4 mg total) by mouth every 8 (eight) hours as needed for nausea. (Patient not taking: Reported on 11/14/2018) 10 tablet 0   . paliperidone (INVEGA SUSTENNA) 156 MG/ML SUSY injection Due before 11/4 Then continue monthly w/ 234 mg (Patient not taking: Reported on 07/24/2019) 1 mL 0   . risperiDONE (RISPERDAL) 3 MG tablet Take 2 tablets (6 mg total) by mouth at bedtime. (Patient not taking: Reported on 07/24/2019) 60 tablet 2   . traMADol (ULTRAM) 50 MG tablet 1 po tid prn pain (Patient not taking: Reported on 11/14/2018) 30 tablet 0     Patient Stressors: Financial difficulties Medication change or noncompliance  Patient Strengths: Physical Health Supportive family/friends  Treatment Modalities:  Medication Management, Group therapy, Case management,  1 to 1 session with clinician, Psychoeducation, Recreational therapy.   Physician Treatment Plan for Primary Diagnosis: <principal problem not specified> Long Term Goal(s): Improvement in symptoms so as ready for discharge Improvement in symptoms so as ready for discharge   Short Term Goals: Ability to identify changes in lifestyle to reduce recurrence of condition will improve Ability to verbalize feelings will improve Ability to demonstrate self-control will improve Ability to identify and develop effective coping behaviors will improve Ability to maintain clinical measurements within normal limits will improve Compliance  with prescribed medications will improve Ability to identify changes in lifestyle to reduce recurrence of condition will improve Ability to verbalize feelings will improve Ability to demonstrate self-control will improve Ability to identify and develop effective coping behaviors will improve Ability to maintain clinical measurements within normal limits will improve Compliance with prescribed medications will improve  Medication Management: Evaluate patient's response, side effects, and tolerance of medication regimen.  Therapeutic Interventions: 1 to 1 sessions, Unit Group sessions and Medication administration.  Evaluation of Outcomes: Not Met  Physician Treatment Plan for Secondary Diagnosis: Active Problems:   Bipolar 1 disorder ([place])  Long Term Goal(s): Improvement in symptoms so as ready for discharge Improvement in symptoms so as ready for discharge   Short Term Goals: Ability to identify changes in lifestyle to reduce recurrence of condition will improve Ability to verbalize feelings will improve Ability to demonstrate self-control will improve Ability to identify and develop effective coping behaviors will improve Ability to maintain clinical measurements within normal limits will improve Compliance with prescribed medications will improve Ability to identify changes in lifestyle to reduce recurrence of condition will improve Ability to verbalize feelings will improve Ability to demonstrate self-control will improve Ability to identify and develop effective coping behaviors will improve Ability to maintain clinical measurements within normal limits will improve Compliance with prescribed medications will improve     Medication Management: Evaluate patient's response, side effects, and tolerance of medication regimen.  Therapeutic Interventions: 1 to 1 sessions, Unit Group sessions and Medication administration.  Evaluation of Outcomes: Not Met   RN Treatment Plan for  Primary Diagnosis: <principal problem not specified> Long Term Goal(s): Knowledge of disease and therapeutic regimen to maintain health will improve  Short Term Goals: Ability to remain free from injury will improve, Ability to verbalize feelings will improve, Ability to identify and develop effective coping behaviors will improve and Compliance with prescribed medications will improve  Medication Management: RN will administer medications as ordered by provider, will assess and evaluate patient's response and provide education to patient for prescribed medication. RN will report any adverse and/or side effects to prescribing provider.  Therapeutic Interventions: 1 on 1 counseling sessions, Psychoeducation, Medication administration, Evaluate responses to treatment, Monitor vital signs and CBGs as ordered, Perform/monitor CIWA, COWS, AIMS and Fall Risk screenings as ordered, Perform wound care treatments as ordered.  Evaluation of Outcomes: Not Met   LCSW Treatment Plan for Primary Diagnosis: <principal problem not specified> Long Term Goal(s): Safe transition to appropriate next level of care at discharge, Engage patient in therapeutic group addressing interpersonal concerns.  Short Term Goals: Engage patient in aftercare planning with referrals and resources, Increase social support, Increase ability to appropriately verbalize feelings, Identify triggers associated with mental health/substance abuse issues and Increase skills for wellness and recovery  Therapeutic Interventions: Assess for all discharge needs, 1 to 1 time with Social worker, Explore available resources and support systems, Assess for adequacy  in community support network, Educate family and significant other(s) on suicide prevention, Complete Psychosocial Assessment, Interpersonal group therapy.  Evaluation of Outcomes: Not Met   Progress in Treatment: Attending groups: No. Participating in groups: No. Taking medication as  prescribed: Yes. Toleration medication: Yes. Family/Significant other contact made: No, will contact:  if consents are provided. Patient understands diagnosis: Yes. Discussing patient identified problems/goals with staff: Yes. Medical problems stabilized or resolved: Yes. Denies suicidal/homicidal ideation: Yes. Issues/concerns per patient self-inventory: No. Other: none  New problem(s) identified: No, Describe:  CSW will continue to assess.  New Short Term/Long Term Goal(s): medication stabilization, elimination of SI thoughts, development of comprehensive mental wellness plan.   Patient Goals:    Discharge Plan or Barriers: Patient recently admitted. CSW will continue to follow and assess for appropriate referrals and possible discharge planning.   Reason for Continuation of Hospitalization: Delusions  Hallucinations Medication stabilization  Estimated Length of Stay: 3-5 days  Attendees: Patient: Did not attend 07/26/2019   Physician:  07/26/2019   Nursing:  07/26/2019  RN Care Manager: 07/26/2019  Social Worker: Darletta Moll, [place] 07/26/2019   Recreational Therapist:  07/26/2019   Other:  07/26/2019  Other:  07/26/2019   Other: 07/26/2019     Scribe for Treatment Team: Vassie Moselle, [place] 07/26/2019 11:43 AM

## 2019-07-26 NOTE — [facility] Counselor (Signed)
CSW attempted to complete PSA with this patient. Patient declined to meet for assessment and stated that she "had a lot of stuff to do."   CSW will attempt to complete PSA at a later time on this date.    Ruthann Cancer MSW, [employer] Clincal Social Worker  [facility]

## 2019-07-27 MED ORDER — NICOTINE POLACRILEX 2 MG MT GUM
2.0000 mg | CHEWING_GUM | OROMUCOSAL | Status: DC | PRN
  Administered 2019-07-27 – 2019-08-01 (×9): 2 mg via ORAL
  Filled 2019-07-27: qty 1

## 2019-07-27 NOTE — Progress Notes (Signed)
Pt continues to be paranoid on the unit    07/27/19 2200  Psych Admission Type (Psych Patients Only)  Admission Status Involuntary  Psychosocial Assessment  Patient Complaints Anxiety  Eye Contact Intense  Facial Expression Anxious;Pensive  Affect Anxious;Appropriate to circumstance;Preoccupied  Speech Logical/coherent  Interaction Cautious;Childlike;Forwards little;Guarded;Minimal  Motor Activity Fidgety;Restless  Appearance/Hygiene Unremarkable  Behavior Characteristics Cooperative  Mood Anxious  Thought Process  Coherency Blocking;Flight of ideas  Content Confabulation;Preoccupation;Paranoia  Delusions Paranoid  Perception Hallucinations  Hallucination Auditory  Judgment Poor  Confusion None  Danger to Self  Current suicidal ideation? Denies  Danger to Others  Danger to Others None reported or observed
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
## 2019-07-27 NOTE — [facility] Suicide Risk Assessment (Signed)
[facility] INPATIENT:  Family/Significant Other Suicide Prevention Education    Suicide Prevention Education:  Patient Refusal for Family/Significant Other Suicide Prevention Education: The patient [name] has refused to provide written consent for family/significant other to be provided Family/Significant Other Suicide Prevention Education during admission and/or prior to discharge.  Physician notified.   SPE completed with patient, as patient refused to consent to family contact. SPI pamphlet provided to pt and pt was encouraged to share information with support network, ask questions, and talk about any concerns relating to SPE. Patient denies access to guns/firearms and verbalized understanding of information provided. Mobile Crisis information also provided to patient.   Ruthann Cancer MSW, [employer] Clincal Social Worker  [facility]

## 2019-07-27 NOTE — Plan of Care (Signed)
Progress note  D: pt found in bed; compliant with medication administration. Pt is still guarded and minimal on their assessment. Pt seems disorganized on approach. Pt has complaints of R hand numbness and their ankles still being swollen. Pt continues to be reclusive and bizarre at times in their room, having elective mutism. Pt denies si/hi/ah/vh and verbally agrees to approach staff if these become apparent or before harming themself/others while at [facility].  A: Pt provided support and encouragement. Pt given medication per protocol and standing orders. Q72m safety checks implemented and continued.  R: Pt safe on the unit. Will continue to monitor.  Pt progressing in the following metrics  Problem: Coping: Goal: Ability to identify and develop effective coping behavior will improve Outcome: Progressing   Problem: Education: Goal: Utilization of techniques to improve thought processes will improve Outcome: Progressing   Problem: Coping: Goal: Will verbalize feelings Outcome: Progressing   Problem: Health Behavior/Discharge Planning: Goal: Compliance with therapeutic regimen will improve Outcome: Progressing

## 2019-07-27 NOTE — Progress Notes (Signed)
[facility] MD Progress Note  07/27/2019 11:59 AM [name]  MRN:  [id number] Subjective:  Patient is a 39 year old female with a past psychiatric history significant for reported bipolar disorder who was involuntarily committed after noncompliance with medications and developing somatic delusions.  Objective: Patient is seen and examined.  Patient is a 39 year old female with the above-stated past psychiatric history who is seen in follow-up.  She remains quite bizarre and paranoid.  She is more verbal today.  Nursing notes reflected that she refused her medications last night, but nursing stated she did take her medications this morning.  When asked about why she was in the hospital currently "I came here because I was having some physical problems, I am not sure why they put me in the mental health hospital".  She remains very somatic.  She stated that she is unable to feel her left arm, she feels like she has gained acute weight since she has been here over the last 2 days.  She stated she has swelling in her legs and ankles.  She stated that she had multiple joints that were hurting.  She would not discuss any other issues with regard to her psychiatric situation.  She did not appear to be responding to internal stimuli as badly as yesterday.  She is still very bizarre.  She stays in her bathroom for extended periods of time.  Her vital signs are stable.  She is still tachycardic.  Her pulse this morning was 127, and repeat was 139.  She is afebrile.  She slept 7 hours last night.  EKGs have been ordered, but she has refused them at least at this point.  Principal Problem: <principal problem not specified> Diagnosis: Active Problems:   Bipolar 1 disorder (HCC)  Total Time spent with patient: 20 minutes  Past Psychiatric History: See admission H&P  Past Medical History:  Past Medical History:  Diagnosis Date  . ADD 10/22/2009  . ALLERGIC RHINITIS 09/26/2008  . Anemia   . ANXIETY 09/26/2008  .  Asthma   . Complication of anesthesia    "tempermental" per patient, "aggiated and hostile"  . CONSTIPATION 09/26/2008  . DEPRESSION 09/26/2008  . FATIGUE 01/14/2009  . Head injury with loss of consciousness (HCC)   . Headache   . HYPERLIPIDEMIA 09/26/2008  . IBS (irritable bowel syndrome)   . OTITIS MEDIA, LEFT 01/14/2009  . SKIN LESION 09/26/2008  . Sleep apnea   . TONSILLITIS, ACUTE 01/14/2009  . Unconscious state (HCC) 10/13/2017  . URI 10/22/2009    Past Surgical History:  Procedure Laterality Date  . CERVICAL POLYPECTOMY  2009  . CERVICAL POLYPECTOMY  2019  . DILATATION & CURETTAGE/HYSTEROSCOPY WITH MYOSURE N/A 11/17/2017   Procedure: DILATATION & CURETTAGE/HYSTEROSCOPY WITH MYOSURE;  Surgeon: Geryl Rankins, MD;  Location: [facility];  Service: Gynecology;  Laterality: N/A;  . ORIF ANKLE FRACTURE Right 08/24/2018   Procedure: OPEN REDUCTION INTERNAL FIXATION TRIMALLEOLAR ANKLE FRACTURE;  Surgeon: Eldred Manges, MD;  Location: [facility] OR;  Service: Orthopedics;  Laterality: Right;   Family History:  Family History  Problem Relation Age of Onset  . Diabetes Other   . Hypertension Other   . Thyroid disease Mother   . Hypertension Mother    Family Psychiatric  History: See admission H&P Social History:  Social History   Substance and Sexual Activity  Alcohol Use Not Currently     Social History   Substance and Sexual Activity  Drug Use No    Social History  Socioeconomic History  . Marital status: Single    Spouse name: Not on file  . Number of children: Not on file  . Years of education: Not on file  . Highest education level: Not on file  Occupational History  . Occupation: full time waitress  Tobacco Use  . Smoking status: Current Every Day Smoker    Packs/day: 1.00    Years: 20.00    Pack years: 20.00    Types: Cigarettes  . Smokeless tobacco: Never Used  Vaping Use  . Vaping Use: Every day  Substance and Sexual Activity  . Alcohol use: Not Currently  . Drug  use: No  . Sexual activity: Not Currently    Birth control/protection: Abstinence  Other Topics Concern  . Not on file  Social History Narrative   Lives with mother   Social Determinants of Health   Financial Resource Strain:   . Difficulty of Paying Living Expenses:   Food Insecurity:   . Worried About Programme researcher, broadcasting/film/video in the Last Year:   . Barista in the Last Year:   Transportation Needs:   . Freight forwarder (Medical):   Marland Kitchen Lack of Transportation (Non-Medical):   Physical Activity:   . Days of Exercise per Week:   . Minutes of Exercise per Session:   Stress:   . Feeling of Stress :   Social Connections:   . Frequency of Communication with Friends and Family:   . Frequency of Social Gatherings with Friends and Family:   . Attends Religious Services:   . Active Member of Clubs or Organizations:   . Attends Banker Meetings:   Marland Kitchen Marital Status:    Additional Social History:    Pain Medications: See MAR Prescriptions: See MAR Over the Counter: See MAR History of alcohol / drug use?: No history of alcohol / drug abuse                    Sleep: Fair  Appetite:  Fair  Current Medications: Current Facility-Administered Medications  Medication Dose Route Frequency Provider Last Rate Last Admin  . acetaminophen (TYLENOL) tablet 650 mg  650 mg Oral Q6H PRN Nira Conn A, NP      . albuterol (VENTOLIN HFA) 108 (90 Base) MCG/ACT inhaler 1-2 puff  1-2 puff Inhalation Q6H PRN Antonieta Pert, MD      . alum & mag hydroxide-simeth (MAALOX/MYLANTA) 200-200-20 MG/5ML suspension 30 mL  30 mL Oral Q4H PRN Nira Conn A, NP      . benztropine (COGENTIN) tablet 1 mg  1 mg Oral BID PRN Antonieta Pert, MD      . carbamazepine (TEGRETOL) chewable tablet 100 mg  100 mg Oral Daily Antonieta Pert, MD   100 mg at 07/27/19 0742  . carbamazepine (TEGRETOL) chewable tablet 200 mg  200 mg Oral QHS Antonieta Pert, MD   200 mg at 07/26/19 2052   . hydrOXYzine (ATARAX/VISTARIL) tablet 25 mg  25 mg Oral TID PRN Jackelyn Poling, NP   25 mg at 07/26/19 2052  . magnesium hydroxide (MILK OF MAGNESIA) suspension 30 mL  30 mL Oral Daily PRN Nira Conn A, NP   30 mL at 07/26/19 0751  . risperiDONE (RISPERDAL M-TABS) disintegrating tablet 1 mg  1 mg Oral Once Nira Conn A, NP      . risperiDONE (RISPERDAL M-TABS) disintegrating tablet 2 mg  2 mg Oral Daily Jola Babinski, Marlane Mingle, MD  2 mg at 07/27/19 0742  . risperiDONE (RISPERDAL M-TABS) disintegrating tablet 4 mg  4 mg Oral QHS Antonieta Pertlary, Auriella Wieand Lawson, MD   4 mg at 07/26/19 2051  . traZODone (DESYREL) tablet 50 mg  50 mg Oral QHS PRN Nira ConnBerry, Jason A, NP   50 mg at 07/26/19 2052    Lab Results: No results found for this or any previous visit (from the past 48 hour(s)).  Blood Alcohol level:  Lab Results  Component Value Date   ETH <10 07/23/2019   ETH <10 11/14/2018    Metabolic Disorder Labs: Lab Results  Component Value Date   HGBA1C 5.4 07/25/2019   MPG 108 07/25/2019   MPG 99.67 05/26/2018   No results found for: PROLACTIN Lab Results  Component Value Date   CHOL 140 07/25/2019   TRIG 84 07/25/2019   HDL 43 07/25/2019   CHOLHDL 3.3 07/25/2019   VLDL 17 07/25/2019   LDLCALC 80 07/25/2019   LDLCALC 71 05/26/2018    Physical Findings: AIMS: Facial and Oral Movements Muscles of Facial Expression: None, normal Lips and Perioral Area: None, normal Jaw: None, normal Tongue: None, normal,Extremity Movements Upper (arms, wrists, hands, fingers): None, normal Lower (legs, knees, ankles, toes): None, normal, Trunk Movements Neck, shoulders, hips: None, normal, Overall Severity Severity of abnormal movements (highest score from questions above): None, normal Incapacitation due to abnormal movements: None, normal Patient's awareness of abnormal movements (rate only patient's report): No Awareness, Dental Status Current problems with teeth and/or dentures?: No Does patient usually  wear dentures?: No  CIWA:  CIWA-Ar Total: 4 COWS:  COWS Total Score: 3  Musculoskeletal: Strength & Muscle Tone: within normal limits Gait & Station: normal Patient leans: N/A  Psychiatric Specialty Exam: Physical Exam Vitals and nursing note reviewed.  Constitutional:      Appearance: Normal appearance. She is obese.  HENT:     Head: Normocephalic and atraumatic.  Pulmonary:     Effort: Pulmonary effort is normal.  Neurological:     General: No focal deficit present.     Mental Status: She is alert and oriented to person, place, and time.     Review of Systems  Musculoskeletal: Positive for arthralgias, back pain, joint swelling and myalgias.    Blood pressure (!) 92/46, pulse (!) 139, temperature 98.6 F (37 C), temperature source Oral, resp. rate 20, height 5\' 9"  (1.753 m), weight 116.1 kg, SpO2 97 %.Body mass index is 37.8 kg/m.  General Appearance: Casual  Eye Contact:  Minimal  Speech:  Normal Rate  Volume:  Decreased  Mood:  Anxious, Dysphoric and Irritable  Affect:  Congruent  Thought Process:  Goal Directed and Descriptions of Associations: Loose  Orientation:  Full (Time, Place, and Person)  Thought Content:  Delusions, Hallucinations: Auditory, Paranoid Ideation and Rumination  Suicidal Thoughts:  No  Homicidal Thoughts:  No  Memory:  Immediate;   Poor Recent;   Poor Remote;   Poor  Judgement:  Impaired  Insight:  Lacking  Psychomotor Activity:  Increased  Concentration:  Concentration: Fair and Attention Span: Fair  Recall:  [employer]  Fund of Knowledge:  Fair  Language:  Fair  Akathisia:  Negative  Handed:  Right  AIMS (if indicated):     Assets:  Desire for Improvement Resilience  ADL's:  Intact  Cognition:  WNL  Sleep:  Number of Hours: 7     Treatment Plan Summary: Daily contact with patient to assess and evaluate symptoms and progress in treatment, Medication management  and Plan : Patient is seen and examined.  Patient is a 39 year old female  with the above-stated past psychiatric history who is seen in follow-up.  Diagnosis: 1.  Schizoaffective disorder; bipolar type versus bipolar disorder with psychotic features  Pertinent findings on examination today: 1.  Continued evidence of internal stimuli or auditory or visual hallucinations.  It is somewhat decreased today. 2.  Continued paranoid thinking. 3.  Continue somatic delusional thinking. 4.  Refused medications last p.m., but did accept them this AM. 5.  Sleep appears to be improved.  Plan: 1.  Continue Risperdal 2 mg p.o. daily and 4 mg p.o. nightly for mood stability and psychosis. 2.  Continue Cogentin 1 mg p.o. twice daily for tremors. 3.  Continue Tegretol 100 mg p.o. daily and 200 mg p.o. nightly for mood stability. 4.  Patient received the long-acting paliperidone injection 234 mg x 1 on 07/25/2019.  This is for psychosis and mood stability. 5.  Disposition planning-in progress.  Antonieta Pert, MD 07/27/2019, 11:59 AM

## 2019-07-27 NOTE — Progress Notes (Signed)
Recreation Therapy Notes  Date: 7.1.21 Time: 1000 Location: [address] Dayroom  Group Topic: Communication, Team Building, Problem Solving  Goal Area(s) Addresses:  Patient will effectively work with peer towards shared goal.  Patient will identify skill used to make activity successful.  Patient will identify how skills used during activity can be used to reach post d/c goals.   Behavioral Response: None  Intervention: STEM Activity   Activity: In team's, using 20 small plastic cups, patients were asked to build the tallest free standing tower possible.    Education: Pharmacist, community, Building control surveyor.   Education Outcome: Acknowledges education/In group clarification offered/Needs additional education.   Clinical Observations/Feedback:  Pt came in for about 5 minutes of group and observed.  Pt left and did not return.     Caroll Rancher, LRT/CTRS   Caroll Rancher A 07/27/2019 11:25 AM

## 2019-07-28 LAB — URINALYSIS, COMPLETE (UACMP) WITH MICROSCOPIC
Bilirubin Urine: NEGATIVE
Glucose, UA: NEGATIVE mg/dL
Hgb urine dipstick: NEGATIVE
Ketones, ur: NEGATIVE mg/dL
Leukocytes,Ua: NEGATIVE
Nitrite: NEGATIVE
Protein, ur: NEGATIVE mg/dL
Specific Gravity, Urine: 1.008 (ref 1.005–1.030)
pH: 7 (ref 5.0–8.0)

## 2019-07-28 MED ORDER — HYDROCHLOROTHIAZIDE 12.5 MG PO CAPS
12.5000 mg | ORAL_CAPSULE | Freq: Once | ORAL | Status: AC
  Administered 2019-07-28: 12.5 mg via ORAL
  Filled 2019-07-28 (×2): qty 1

## 2019-07-28 NOTE — Progress Notes (Signed)
[facility] MD Progress Note  07/28/2019 10:17 AM [name]  MRN:  [id number] Subjective:  Patient is a 39 year old female with a past psychiatric history significant for reported bipolar disorder who was involuntarily committed after noncompliance with medications and developing somatic delusions.  Objective: Patient is seen and examined.  Patient is a 39 year old female with the above-stated past psychiatric history who is seen in follow-up.  He remains significantly paranoid, but is more verbal today.  She stated that she feels like her "thoughts are coming together".  She still appears to be responding to internal stimuli.  She remains very somatically oriented.  She continues to complain of multiple joint pain and multiple other symptoms.  She feels like her vaginal area "smells", and stated that she had had sex approximately 2 months ago, and afraid that she may have sexually transmitted diseases.  She also complains of lower extremity swelling.  On physical examination she does have a trace edema, and I have agreed to give her a low dose of hydrochlorothiazide to assist with that.  She was compliant with medications all yesterday.  Her vital signs were stable initially this morning, but she became tachycardic at a rate of 124.  Her sleep has improved.  She slept 7 hours last night.  She did have an abnormal urinalysis on admission, but was contaminated and did not have any white blood cells.  She was not treated for urinary tract infection.  Her EKG from 07/27/2019 showed a sinus rhythm with a normal QTc interval.  She denied any suicidal or homicidal ideation.  Principal Problem: <principal problem not specified> Diagnosis: Active Problems:   Bipolar 1 disorder (HCC)  Total Time spent with patient: 20 minutes  Past Psychiatric History: See admission H&P  Past Medical History:  Past Medical History:  Diagnosis Date  . ADD 10/22/2009  . ALLERGIC RHINITIS 09/26/2008  . Anemia   . ANXIETY 09/26/2008   . Asthma   . Complication of anesthesia    "tempermental" per patient, "aggiated and hostile"  . CONSTIPATION 09/26/2008  . DEPRESSION 09/26/2008  . FATIGUE 01/14/2009  . Head injury with loss of consciousness (HCC)   . Headache   . HYPERLIPIDEMIA 09/26/2008  . IBS (irritable bowel syndrome)   . OTITIS MEDIA, LEFT 01/14/2009  . SKIN LESION 09/26/2008  . Sleep apnea   . TONSILLITIS, ACUTE 01/14/2009  . Unconscious state (HCC) 10/13/2017  . URI 10/22/2009    Past Surgical History:  Procedure Laterality Date  . CERVICAL POLYPECTOMY  2009  . CERVICAL POLYPECTOMY  2019  . DILATATION & CURETTAGE/HYSTEROSCOPY WITH MYOSURE N/A 11/17/2017   Procedure: DILATATION & CURETTAGE/HYSTEROSCOPY WITH MYOSURE;  Surgeon: Geryl Rankins, MD;  Location: [facility];  Service: Gynecology;  Laterality: N/A;  . ORIF ANKLE FRACTURE Right 08/24/2018   Procedure: OPEN REDUCTION INTERNAL FIXATION TRIMALLEOLAR ANKLE FRACTURE;  Surgeon: Eldred Manges, MD;  Location: [facility] OR;  Service: Orthopedics;  Laterality: Right;   Family History:  Family History  Problem Relation Age of Onset  . Diabetes Other   . Hypertension Other   . Thyroid disease Mother   . Hypertension Mother    Family Psychiatric  History: See admission H&P Social History:  Social History   Substance and Sexual Activity  Alcohol Use Not Currently     Social History   Substance and Sexual Activity  Drug Use No    Social History   Socioeconomic History  . Marital status: Single    Spouse name: Not on file  .  Number of children: Not on file  . Years of education: Not on file  . Highest education level: Not on file  Occupational History  . Occupation: full time waitress  Tobacco Use  . Smoking status: Current Every Day Smoker    Packs/day: 1.00    Years: 20.00    Pack years: 20.00    Types: Cigarettes  . Smokeless tobacco: Never Used  Vaping Use  . Vaping Use: Every day  Substance and Sexual Activity  . Alcohol use: Not Currently  .  Drug use: No  . Sexual activity: Not Currently    Birth control/protection: Abstinence  Other Topics Concern  . Not on file  Social History Narrative   Lives with mother   Social Determinants of Health   Financial Resource Strain:   . Difficulty of Paying Living Expenses:   Food Insecurity:   . Worried About Programme researcher, broadcasting/film/videounning Out of Food in the Last Year:   . Baristaan Out of Food in the Last Year:   Transportation Needs:   . Freight forwarderLack of Transportation (Medical):   Marland Kitchen. Lack of Transportation (Non-Medical):   Physical Activity:   . Days of Exercise per Week:   . Minutes of Exercise per Session:   Stress:   . Feeling of Stress :   Social Connections:   . Frequency of Communication with Friends and Family:   . Frequency of Social Gatherings with Friends and Family:   . Attends Religious Services:   . Active Member of Clubs or Organizations:   . Attends BankerClub or Organization Meetings:   Marland Kitchen. Marital Status:    Additional Social History:    Pain Medications: See MAR Prescriptions: See MAR Over the Counter: See MAR History of alcohol / drug use?: No history of alcohol / drug abuse                    Sleep: Good  Appetite:  Good  Current Medications: Current Facility-Administered Medications  Medication Dose Route Frequency Provider Last Rate Last Admin  . acetaminophen (TYLENOL) tablet 650 mg  650 mg Oral Q6H PRN Nira ConnBerry, Jason A, NP      . albuterol (VENTOLIN HFA) 108 (90 Base) MCG/ACT inhaler 1-2 puff  1-2 puff Inhalation Q6H PRN Antonieta Pertlary, Princessa Lesmeister Lawson, MD      . alum & mag hydroxide-simeth (MAALOX/MYLANTA) 200-200-20 MG/5ML suspension 30 mL  30 mL Oral Q4H PRN Nira ConnBerry, Jason A, NP      . benztropine (COGENTIN) tablet 1 mg  1 mg Oral BID PRN Antonieta Pertlary, Theona Muhs Lawson, MD      . carbamazepine (TEGRETOL) chewable tablet 100 mg  100 mg Oral Daily Antonieta Pertlary, Augustine Leverette Lawson, MD   100 mg at 07/28/19 0829  . carbamazepine (TEGRETOL) chewable tablet 200 mg  200 mg Oral QHS Antonieta Pertlary, Avrey Flanagin Lawson, MD   200 mg at 07/27/19  2130  . hydrochlorothiazide (MICROZIDE) capsule 12.5 mg  12.5 mg Oral Once Antonieta Pertlary, Nuchem Grattan Lawson, MD      . hydrOXYzine (ATARAX/VISTARIL) tablet 25 mg  25 mg Oral TID PRN Jackelyn PolingBerry, Jason A, NP   25 mg at 07/27/19 2130  . magnesium hydroxide (MILK OF MAGNESIA) suspension 30 mL  30 mL Oral Daily PRN Nira ConnBerry, Jason A, NP   30 mL at 07/26/19 0751  . nicotine polacrilex (NICORETTE) gum 2 mg  2 mg Oral PRN Antonieta Pertlary, Timmie Dugue Lawson, MD   2 mg at 07/27/19 1751  . risperiDONE (RISPERDAL M-TABS) disintegrating tablet 1 mg  1 mg Oral Once  Nira Conn A, NP      . risperiDONE (RISPERDAL M-TABS) disintegrating tablet 2 mg  2 mg Oral Daily Antonieta Pert, MD   2 mg at 07/28/19 0829  . risperiDONE (RISPERDAL M-TABS) disintegrating tablet 4 mg  4 mg Oral QHS Antonieta Pert, MD   4 mg at 07/27/19 2129  . traZODone (DESYREL) tablet 50 mg  50 mg Oral QHS PRN Jackelyn Poling, NP   50 mg at 07/27/19 2129    Lab Results: No results found for this or any previous visit (from the past 48 hour(s)).  Blood Alcohol level:  Lab Results  Component Value Date   ETH <10 07/23/2019   ETH <10 11/14/2018    Metabolic Disorder Labs: Lab Results  Component Value Date   HGBA1C 5.4 07/25/2019   MPG 108 07/25/2019   MPG 99.67 05/26/2018   No results found for: PROLACTIN Lab Results  Component Value Date   CHOL 140 07/25/2019   TRIG 84 07/25/2019   HDL 43 07/25/2019   CHOLHDL 3.3 07/25/2019   VLDL 17 07/25/2019   LDLCALC 80 07/25/2019   LDLCALC 71 05/26/2018    Physical Findings: AIMS: Facial and Oral Movements Muscles of Facial Expression: None, normal Lips and Perioral Area: None, normal Jaw: None, normal Tongue: None, normal,Extremity Movements Upper (arms, wrists, hands, fingers): None, normal Lower (legs, knees, ankles, toes): None, normal, Trunk Movements Neck, shoulders, hips: None, normal, Overall Severity Severity of abnormal movements (highest score from questions above): None, normal Incapacitation due  to abnormal movements: None, normal Patient's awareness of abnormal movements (rate only patient's report): No Awareness, Dental Status Current problems with teeth and/or dentures?: No Does patient usually wear dentures?: No  CIWA:  CIWA-Ar Total: 4 COWS:  COWS Total Score: 3  Musculoskeletal: Strength & Muscle Tone: within normal limits Gait & Station: normal Patient leans: N/A  Psychiatric Specialty Exam: Physical Exam Vitals and nursing note reviewed.  Constitutional:      Appearance: Normal appearance. She is obese.  HENT:     Head: Normocephalic and atraumatic.  Pulmonary:     Effort: Pulmonary effort is normal.  Neurological:     General: No focal deficit present.     Mental Status: She is alert and oriented to person, place, and time.     Review of Systems  Blood pressure (!) 130/109, pulse (!) 124, temperature 97.9 F (36.6 C), temperature source Oral, resp. rate 20, height 5\' 9"  (1.753 m), weight 116.1 kg, SpO2 97 %.Body mass index is 37.8 kg/m.  General Appearance: Casual  Eye Contact:  Fair  Speech:  Normal Rate  Volume:  Decreased  Mood:  Anxious and Dysphoric  Affect:  Congruent  Thought Process:  Coherent and Descriptions of Associations: Circumstantial  Orientation:  Full (Time, Place, and Person)  Thought Content:  Delusions, Hallucinations: Auditory and Paranoid Ideation  Suicidal Thoughts:  No  Homicidal Thoughts:  No  Memory:  Immediate;   Fair Recent;   Fair Remote;   Fair  Judgement:  Impaired  Insight:  Lacking  Psychomotor Activity:  Increased  Concentration:  Concentration: Fair and Attention Span: Fair  Recall:  of Knowledge:  Fair  Language:  Fair  Akathisia:  Negative  Handed:  Right  AIMS (if indicated):     Assets:  Desire for Improvement Resilience  ADL's:  Intact  Cognition:  WNL  Sleep:  Number of Hours: 7     Treatment Plan Summary: Daily contact with  patient to assess and evaluate symptoms and progress in  treatment, Medication management and Plan : Patient is seen and examined.  Patient is a 39 year old female with the above-stated past psychiatric history who is seen in follow-up.  Diagnosis: 1. Schizoaffective disorder; bipolar type versus bipolar disorder with psychotic features  Pertinent findings on examination today: 1.  Still with evidence of responding to internal stimuli.  She does not appear to be having the visual hallucinations that were noted earlier. 2.  Continued paranoid thinking but somewhat less than on admission. 3.  Continued somatic delusional thinking. 4.  Sleep has improved. 5.  Compliance with current medications.  Plan: 1.  Continue Risperdal 2 mg p.o. daily and 4 mg p.o. nightly for mood stability and psychosis. 2.  Continue Cogentin 1 mg p.o. twice daily for tremors. 3.  Continue Tegretol 100 mg p.o. daily and 200 mg p.o. nightly for mood stability. 4.  Patient received the long-acting paliperidone injection 234 mg x 1 on 07/25/2019.  This is for psychosis and mood stability. 5.    Will order RPR, HIV, GC/chlamydia and repeat urinalysis.  We will also obtain Tegretol level, liver function enzymes and repeat CBC given treatment on 7/3. 6.  We will give one-time dose of hydrochlorothiazide 12.5 mg p.o. x1 for lower extremity swelling. 7.  We will continue to monitor blood pressure. 8.  Disposition planning-in progress. Antonieta Pert, MD 07/28/2019, 10:17 AM

## 2019-07-28 NOTE — Progress Notes (Signed)
Pt paranoid, pt wants to be D/C because another pt talking about talking to the doctor and wanting to be D/C , so pt focused on wanting to talk to the doctor.    07/28/19 2100  Psych Admission Type (Psych Patients Only)  Admission Status Involuntary  Psychosocial Assessment  Patient Complaints Anxiety  Eye Contact Intense  Facial Expression Anxious;Pensive  Affect Anxious;Appropriate to circumstance;Preoccupied  Speech Logical/coherent  Interaction Cautious;Childlike;Forwards little;Guarded;Minimal  Motor Activity Fidgety;Restless  Appearance/Hygiene Unremarkable  Behavior Characteristics Cooperative  Mood Anxious  Thought Process  Coherency Blocking;Flight of ideas  Content Confabulation;Preoccupation;Paranoia  Delusions Paranoid  Perception Hallucinations  Hallucination Auditory  Judgment Poor  Confusion None  Danger to Self  Current suicidal ideation? Denies  Danger to Others  Danger to Others None reported or observed

## 2019-07-28 NOTE — Progress Notes (Signed)
Pt attended spirituality group facilitated by [name], [name], [name].  Group Description: Group focused on topic of hope. Patients participated in facilitated discussion around topic, connecting with one another around experiences and definitions for hope. Group members engaged with visual explorer photos, reflecting on what hope looks like for them today. Group engaged in discussion around how their definitions of hope are present today in hospital.  Modalities: Psycho-social ed, Adlerian, Narrative, MI  Patient Progress:  [name] was present through group.  Initially did not identify hope, later connected with hope through music.  Noted her favorite artist is [name] and described [name] being outside the box, an individual with her own voice - and [name] connects to these qualities.  She engaged with another group member around holding on to hope in the midst of stress.

## 2019-07-29 LAB — CBC WITH DIFFERENTIAL/PLATELET
Abs Immature Granulocytes: 0.02 10*3/uL (ref 0.00–0.07)
Basophils Absolute: 0 10*3/uL (ref 0.0–0.1)
Basophils Relative: 1 %
Eosinophils Absolute: 0.1 10*3/uL (ref 0.0–0.5)
Eosinophils Relative: 2 %
HCT: 41.7 % (ref 36.0–46.0)
Hemoglobin: 13.2 g/dL (ref 12.0–15.0)
Immature Granulocytes: 0 %
Lymphocytes Relative: 36 %
Lymphs Abs: 3 10*3/uL (ref 0.7–4.0)
MCH: 30.3 pg (ref 26.0–34.0)
MCHC: 31.7 g/dL (ref 30.0–36.0)
MCV: 95.9 fL (ref 80.0–100.0)
Monocytes Absolute: 0.5 10*3/uL (ref 0.1–1.0)
Monocytes Relative: 6 %
Neutro Abs: 4.8 10*3/uL (ref 1.7–7.7)
Neutrophils Relative %: 55 %
Platelets: 240 10*3/uL (ref 150–400)
RBC: 4.35 MIL/uL (ref 3.87–5.11)
RDW: 12.8 % (ref 11.5–15.5)
WBC: 8.4 10*3/uL (ref 4.0–10.5)
nRBC: 0 % (ref 0.0–0.2)

## 2019-07-29 LAB — CARBAMAZEPINE LEVEL, TOTAL: Carbamazepine Lvl: 6.8 ug/mL (ref 4.0–12.0)

## 2019-07-29 LAB — HEPATIC FUNCTION PANEL
ALT: 30 U/L (ref 0–44)
AST: 21 U/L (ref 15–41)
Albumin: 4.2 g/dL (ref 3.5–5.0)
Alkaline Phosphatase: 111 U/L (ref 38–126)
Bilirubin, Direct: 0.1 mg/dL (ref 0.0–0.2)
Indirect Bilirubin: 0.3 mg/dL (ref 0.3–0.9)
Total Bilirubin: 0.4 mg/dL (ref 0.3–1.2)
Total Protein: 7 g/dL (ref 6.5–8.1)

## 2019-07-29 LAB — RPR: RPR Ser Ql: NONREACTIVE

## 2019-07-29 NOTE — Progress Notes (Signed)
   07/29/19 2010  COVID-19 Daily Checkoff  Have you had a fever (temp > 37.80C/100F)  in the past 24 hours?  No  If you have had runny nose, nasal congestion, sneezing in the past 24 hours, has it worsened? No  COVID-19 EXPOSURE  Have you traveled outside the state in the past 14 days? No  Have you been in contact with someone with a confirmed diagnosis of COVID-19 or PUI in the past 14 days without wearing appropriate PPE? No  Have you been living in the same home as a person with confirmed diagnosis of COVID-19 or a PUI (household contact)? No  Have you been diagnosed with COVID-19? No

## 2019-07-29 NOTE — Progress Notes (Signed)
   07/29/19 2010  Psych Admission Type (Psych Patients Only)  Admission Status Involuntary  Psychosocial Assessment  Patient Complaints Other (Comment)  Eye Contact Fair  Facial Expression Anxious;Pensive  Affect Anxious;Appropriate to circumstance;Preoccupied  Speech Logical/coherent  Interaction Cautious;Childlike;Forwards little;Guarded;Minimal  Motor Activity Fidgety;Restless  Appearance/Hygiene Unremarkable  Behavior Characteristics Cooperative  Mood Pleasant  Aggressive Behavior  Targets Self;Property;Family  Type of Behavior Striking out;Verbal  Effect No apparent injury  Thought Process  Coherency WDL  Content Preoccupation  Delusions Paranoid  Perception Hallucinations  Hallucination Auditory  Judgment Poor  Confusion None  Danger to Self  Current suicidal ideation? Denies  Danger to Others  Danger to Others None reported or observed

## 2019-07-29 NOTE — Progress Notes (Addendum)
Pt visible in milieu majority of this shift. Reports she slept fairly last night with fair appetite, low energy and good concentration level. Rates her depression 5/10, hopelessness 5/10 and anxiety 6/10 "I am ok right now, I will let you". Denies SI, HI and AVH. Verbally contracts for safety. Rates her right ankle pain 6/10 with some swelling. Remains medication compliant. Attended scheduled unit groups. Goal for today is "getting my medicine right, talking to the doctor". Pt requested for mom to get her car key to remove her car from the gas station.She signed her belonging sheet in agreement.  Observed to be less somatic, preoccupied about d/c and her right ankle. Elevated her right leg when sitting  as encouraged by writer to help decrease her edema. Emotional support offered to patient as needed throughout this shift. Scheduled & PRN medications (Tylenol) given as ordered and effects monitored. Q 15 minutes safety checks maintained without self harm gestures or outburst to note thus far.  Pt tolerates all medications and PO intake well. Denies adverse drug reactions when assessed. Reports decrease pain in leg and anxiety level as well. Off unit to gym for activities, returned without issues.

## 2019-07-29 NOTE — Progress Notes (Signed)
[facility]  07/29/2019 12:13 PM [name]  MRN:  [id number] Subjective:  Patient is a 39 year old female with a past psychiatric history significant for reported bipolar disorder who was involuntarily committed after noncompliance with medications and developing somatic delusions.  Objective: Patient is seen and examined.  Patient is a 39 year old female with the above-stated past psychiatric history who is seen in follow-up.  Her paranoia seems to be decreasing.  She is able to talk relatively rationally today.  She did state that she spoke to her mother.  She stated that she had a good conversation with her mother.  She seems to be less focused on somatic problems.  She did talk about her ankle pain, but less so than previously.  She denied any side effects to her current medications.  Her vital signs are stable except for her heart rate.  She is tachycardic this morning.  Her rate was 128 and repeat was 138.  Her last EKG was from 7/1, and I ordered another yesterday.  I will make sure that is done today to assess the rhythm.  Repeat laboratories from 7/3 revealed normal liver function enzymes, normal CBC.  Her Tegretol level on 7/3 was 6.8.  Her RPR was nonreactive.  Urinalysis from 7/2 showed many bacteria, 0-5 squamous epithelial cells and 0-5 white blood cells.  Principal Problem: <principal problem not specified> Diagnosis: Active Problems:   Bipolar 1 disorder (HCC)  Total Time spent with patient: 20 minutes  Past Psychiatric History: See admission H&P  Past Medical History:  Past Medical History:  Diagnosis Date   ADD 10/22/2009   ALLERGIC RHINITIS 09/26/2008   Anemia    ANXIETY 09/26/2008   Asthma    Complication of anesthesia    "tempermental" per patient, "aggiated and hostile"   CONSTIPATION 09/26/2008   DEPRESSION 09/26/2008   FATIGUE 01/14/2009   Head injury with loss of consciousness (HCC)    Headache    HYPERLIPIDEMIA 09/26/2008   IBS (irritable bowel  syndrome)    OTITIS MEDIA, LEFT 01/14/2009   SKIN LESION 09/26/2008   Sleep apnea    TONSILLITIS, ACUTE 01/14/2009   Unconscious state (HCC) 10/13/2017   URI 10/22/2009    Past Surgical History:  Procedure Laterality Date   CERVICAL POLYPECTOMY  2009   CERVICAL POLYPECTOMY  2019   DILATATION & CURETTAGE/HYSTEROSCOPY WITH MYOSURE N/A 11/17/2017   Procedure: DILATATION & CURETTAGE/HYSTEROSCOPY WITH MYOSURE;  Surgeon: Geryl RankinsVarnado, Evelyn, MD;  Location: [facility];  Service: Gynecology;  Laterality: N/A;   ORIF ANKLE FRACTURE Right 08/24/2018   Procedure: OPEN REDUCTION INTERNAL FIXATION TRIMALLEOLAR ANKLE FRACTURE;  Surgeon: Eldred MangesYates, Mark C, MD;  Location: [facility];  Service: Orthopedics;  Laterality: Right;   Family History:  Family History  Problem Relation Age of Onset   Diabetes Other    Hypertension Other    Thyroid disease Mother    Hypertension Mother    Family Psychiatric  History: See admission H&P Social History:  Social History   Substance and Sexual Activity  Alcohol Use Not Currently     Social History   Substance and Sexual Activity  Drug Use No    Social History   Socioeconomic History   Marital status: Single    Spouse name: Not on file   Number of children: Not on file   Years of education: Not on file   Highest education level: Not on file  Occupational History   Occupation: full time waitress  Tobacco Use   Smoking  status: Current Every Day Smoker    Packs/day: 1.00    Years: 20.00    Pack years: 20.00    Types: Cigarettes   Smokeless tobacco: Never Used  Building services engineer Use: Every day  Substance and Sexual Activity   Alcohol use: Not Currently   Drug use: No   Sexual activity: Not Currently    Birth control/protection: Abstinence  Other Topics Concern   Not on file  Social History Narrative   Lives with mother   Social Determinants of Health   Financial Resource Strain:    Difficulty of Paying Living Expenses:    Food Insecurity:    Worried About Programme researcher, broadcasting/film/video in the Last Year:    Barista in the Last Year:   Transportation Needs:    Freight forwarder (Medical):    Lack of Transportation (Non-Medical):   Physical Activity:    Days of Exercise per Week:    Minutes of Exercise per Session:   Stress:    Feeling of Stress :   Social Connections:    Frequency of Communication with Friends and Family:    Frequency of Social Gatherings with Friends and Family:    Attends Religious Services:    Active Member of Clubs or Organizations:    Attends Banker Meetings:    Marital Status:    Additional Social History:    Pain Medications: See MAR Prescriptions: See MAR Over the Counter: See MAR History of alcohol / drug use?: No history of alcohol / drug abuse                    Sleep: Good  Appetite:  Good  Current Medications: Current Facility-Administered Medications  Medication Dose Route Frequency Provider Last Rate Last Admin   acetaminophen (TYLENOL) tablet 650 mg  650 mg Oral Q6H PRN Nira Conn A, NP   650 mg at 07/29/19 0822   albuterol (VENTOLIN HFA) 108 (90 Base) MCG/ACT inhaler 1-2 puff  1-2 puff Inhalation Q6H PRN Antonieta Pert, MD       alum & mag hydroxide-simeth (MAALOX/MYLANTA) 200-200-20 MG/5ML suspension 30 mL  30 mL Oral Q4H PRN Nira Conn A, NP       benztropine (COGENTIN) tablet 1 mg  1 mg Oral BID PRN Antonieta Pert, MD       carbamazepine (TEGRETOL) chewable tablet 100 mg  100 mg Oral Daily Antonieta Pert, MD   100 mg at 07/29/19 9379   carbamazepine (TEGRETOL) chewable tablet 200 mg  200 mg Oral QHS Antonieta Pert, MD   200 mg at 07/28/19 2046   hydrOXYzine (ATARAX/VISTARIL) tablet 25 mg  25 mg Oral TID PRN Jackelyn Poling, NP   25 mg at 07/28/19 2046   magnesium hydroxide (MILK OF MAGNESIA) suspension 30 mL  30 mL Oral Daily PRN Nira Conn A, NP   30 mL at 07/26/19 0751   nicotine  polacrilex (NICORETTE) gum 2 mg  2 mg Oral PRN Antonieta Pert, MD   2 mg at 07/28/19 1458   risperiDONE (RISPERDAL M-TABS) disintegrating tablet 1 mg  1 mg Oral Once Nira Conn A, NP       risperiDONE (RISPERDAL M-TABS) disintegrating tablet 2 mg  2 mg Oral Daily Antonieta Pert, MD   2 mg at 07/29/19 0240   risperiDONE (RISPERDAL M-TABS) disintegrating tablet 4 mg  4 mg Oral QHS Antonieta Pert, MD   4  mg at 07/28/19 2047   traZODone (DESYREL) tablet 50 mg  50 mg Oral QHS PRN Jackelyn Poling, NP   50 mg at 07/28/19 2046    Lab Results:  Results for orders placed or performed during the hospital encounter of 07/24/19 (from the past 48 hour(s))  Urinalysis, Complete w Microscopic     Status: Abnormal   Collection Time: 07/28/19  6:26 PM  Result Value Ref Range   Color, Urine STRAW (A) YELLOW   APPearance CLEAR CLEAR   Specific Gravity, Urine 1.008 1.005 - 1.030   pH 7.0 5.0 - 8.0   Glucose, UA NEGATIVE NEGATIVE mg/dL   Hgb urine dipstick NEGATIVE NEGATIVE   Bilirubin Urine NEGATIVE NEGATIVE   Ketones, ur NEGATIVE NEGATIVE mg/dL   Protein, ur NEGATIVE NEGATIVE mg/dL   Nitrite NEGATIVE NEGATIVE   Leukocytes,Ua NEGATIVE NEGATIVE   RBC / HPF 0-5 0 - 5 RBC/hpf   WBC, UA 0-5 0 - 5 WBC/hpf   Bacteria, UA MANY (A) NONE SEEN   Squamous Epithelial / LPF 0-5 0 - 5    Comment: Performed at [facility], [address], [place], [place] [address]  RPR     Status: None   Collection Time: 07/29/19  6:31 AM  Result Value Ref Range   RPR Ser Ql NON REACTIVE NON REACTIVE    Comment: Performed at [facility], [address] [place], [place] [address]  Carbamazepine level, total     Status: None   Collection Time: 07/29/19  6:31 AM  Result Value Ref Range   Carbamazepine Lvl 6.8 4.0 - 12.0 ug/mL    Comment: Performed at [facility], [address], [place], [place] [address]  Hepatic function panel     Status: None   Collection Time: 07/29/19  6:31  AM  Result Value Ref Range   Total Protein 7.0 6.5 - 8.1 g/dL   Albumin 4.2 3.5 - 5.0 g/dL   AST 21 15 - 41 U/L   ALT 30 0 - 44 U/L   Alkaline Phosphatase 111 38 - 126 U/L   Total Bilirubin 0.4 0.3 - 1.2 mg/dL   Bilirubin, Direct 0.1 0.0 - 0.2 mg/dL   Indirect Bilirubin 0.3 0.3 - 0.9 mg/dL    Comment: Performed at [facility], [address] [place], [place] [address]  CBC with Differential/Platelet     Status: None   Collection Time: 07/29/19  6:31 AM  Result Value Ref Range   WBC 8.4 4.0 - 10.5 K/uL   RBC 4.35 3.87 - 5.11 MIL/uL   Hemoglobin 13.2 12.0 - 15.0 g/dL   HCT 70.2 36 - 46 %   MCV 95.9 80.0 - 100.0 fL   MCH 30.3 26.0 - 34.0 pg   MCHC 31.7 30.0 - 36.0 g/dL   RDW 63.7 85.8 - 85.0 %   Platelets 240 150 - 400 K/uL   nRBC 0.0 0.0 - 0.2 %   Neutrophils Relative % 55 %   Neutro Abs 4.8 1.7 - 7.7 K/uL   Lymphocytes Relative 36 %   Lymphs Abs 3.0 0.7 - 4.0 K/uL   Monocytes Relative 6 %   Monocytes Absolute 0.5 0 - 1 K/uL   Eosinophils Relative 2 %   Eosinophils Absolute 0.1 0 - 0 K/uL   Basophils Relative 1 %   Basophils Absolute 0.0 0 - 0 K/uL   Immature Granulocytes 0 %   Abs Immature Granulocytes 0.02 0.00 -  0.07 K/uL    Comment: Performed at [facility], [address] [place], [place] [address]    Blood Alcohol level:  Lab Results  Component Value Date   ETH <10 07/23/2019   ETH <10 11/14/2018    Metabolic Disorder Labs: Lab Results  Component Value Date   HGBA1C 5.4 07/25/2019   MPG 108 07/25/2019   MPG 99.67 05/26/2018   No results found for: PROLACTIN Lab Results  Component Value Date   CHOL 140 07/25/2019   TRIG 84 07/25/2019   HDL 43 07/25/2019   CHOLHDL 3.3 07/25/2019   VLDL 17 07/25/2019   LDLCALC 80 07/25/2019   LDLCALC 71 05/26/2018    Physical Findings: AIMS: Facial and Oral Movements Muscles of Facial Expression: None, normal Lips and Perioral Area: None, normal Jaw: None, normal Tongue:  None, normal,Extremity Movements Upper (arms, wrists, hands, fingers): None, normal Lower (legs, knees, ankles, toes): None, normal, Trunk Movements Neck, shoulders, hips: None, normal, Overall Severity Severity of abnormal movements (highest score from questions above): None, normal Incapacitation due to abnormal movements: None, normal Patient's awareness of abnormal movements (rate only patient's report): No Awareness, Dental Status Current problems with teeth and/or dentures?: No Does patient usually wear dentures?: No  CIWA:  CIWA-Ar Total: 4 COWS:  COWS Total Score: 3  Musculoskeletal: Strength & Muscle Tone: within normal limits Gait & Station: normal Patient leans: N/A  Psychiatric Specialty Exam: Physical Exam Vitals and nursing note reviewed.  Constitutional:      Appearance: Normal appearance.  HENT:     Head: Normocephalic and atraumatic.  Pulmonary:     Effort: Pulmonary effort is normal.  Neurological:     General: No focal deficit present.     Mental Status: She is alert and oriented to person, place, and time.     Review of Systems  Blood pressure 115/66, pulse (!) 138, temperature (!) 97.5 F (36.4 C), temperature source Oral, resp. rate 20, height 5\' 9"  (1.753 m), weight 116.1 kg, SpO2 97 %.Body mass index is 37.8 kg/m.  General Appearance: Casual  Eye Contact:  Fair  Speech:  Normal Rate  Volume:  Normal  Mood:  Anxious and Dysphoric  Affect:  Congruent  Thought Process:  Coherent and Descriptions of Associations: Loose  Orientation:  Full (Time, Place, and Person)  Thought Content:  Delusions and Rumination  Suicidal Thoughts:  No  Homicidal Thoughts:  No  Memory:  Immediate;   Fair Recent;   Fair Remote;   Fair  Judgement:  Intact  Insight:  Fair  Psychomotor Activity:  Normal  Concentration:  Concentration: Fair and Attention Span: Fair  Recall:  of Knowledge:  Fair  Language:  Fair  Akathisia:  Negative  Handed:  Right  AIMS  (if indicated):     Assets:  Desire for Improvement Housing Resilience  ADL's:  Intact  Cognition:  WNL  Sleep:  Number of Hours: 8.25     Treatment Plan Summary: Daily contact with patient to assess and evaluate symptoms and progress in treatment, Medication management and Plan : Patient is seen and examined.  Patient is a 39 year old female with the above-stated past psychiatric history who is seen in follow-up.   Diagnosis: 1. Schizoaffective disorder; bipolar type versus bipolar disorder with psychotic features  Pertinent findings on examination today: 1.    Still with preoccupation on internal processes, but improving. 2.  Continued paranoid thinking but somewhat less than on admission. 3.  Continued somatic  delusional thinking but is improving. 4.  Sleep has improved. 5.  Compliance with current medications.  Plan: 1.  Continue Cogentin 1 mg p.o. twice daily as needed tremors. 2.  Continue Tegretol 100 mg p.o. daily and 200 mg p.o. nightly for mood stability. 3.  Encourage patient to have EKG done today. 4.  RPR negative. 5.  Continue Risperdal 2 mg p.o. daily and 4 mg p.o. nightly for mood stability and psychosis. 6.  Continue trazodone 50 mg p.o. nightly as needed insomnia. 7.  Disposition planning-in progress.  Antonieta Pert, MD 07/29/2019, 12:13 PM

## 2019-07-30 LAB — HIV-1 RNA QUANT-NO REFLEX-BLD
HIV 1 RNA Quant: <20 copies/mL
LOG10 HIV-1 RNA: UNDETERMINED log10copy/mL

## 2019-07-30 NOTE — Progress Notes (Signed)
   07/30/19 2310  Psych Admission Type (Psych Patients Only)  Admission Status Involuntary  Psychosocial Assessment  Patient Complaints Anxiety  Eye Contact Fair  Facial Expression Flat  Affect Appropriate to circumstance  Speech Logical/coherent  Interaction Assertive  Motor Activity Slow  Appearance/Hygiene Unremarkable  Behavior Characteristics Appropriate to situation  Mood Pleasant  Thought Process  Content Preoccupation  Delusions Paranoid  Perception Hallucinations  Hallucination Auditory  Judgment Poor  Confusion None  Danger to Self  Current suicidal ideation? Denies  Danger to Others  Danger to Others None reported or observed

## 2019-07-30 NOTE — Progress Notes (Signed)
D. Pt presents with a flat affect/anxious mood- has been calm, and cooperative, and visible in the milieu interacting well with peers. Per pt's self inventory, pt rated her depression, hopelessness and anxiety a 4/4/7, respectively. Pt writes that her goal today is "staying focused on my goals plus resting".  Pt currently denies SI/HI and AVH  A. Labs and vitals monitored. Pt compliant with medications. Pt supported emotionally and encouraged to express concerns and ask questions.   R. Pt remains safe with 15 minute checks. Will continue POC.

## 2019-07-30 NOTE — Progress Notes (Signed)
[facility] MD Progress Note  07/30/2019 12:27 PM [name]  MRN:  [id number] Subjective:  Patient is a 39 year old female with a past psychiatric history significant for reported bipolar disorder who was involuntarily committed after noncompliance with medications and developing somatic delusions.  Objective: Patient is seen and examined.  Patient is a 39 year old female with the above-stated past psychiatric history who is seen in follow-up.  She continues to slowly improve.  She is less somatically focused today.  She did complain of continued problems with her right ankle, but she has had an injury to that in the past.  She denied auditory or visual hallucinations.  She stated that the medicines were making her a bit sleepy, but no other side effects.  She denied any paranoid thinking.  She is fearful of to aggressive patients on the unit, but I think that was appropriate.  Vital signs are stable, she continues to be tachycardic.  CBC from 7/3 was normal.  Principal Problem: <principal problem not specified> Diagnosis: Active Problems:   Bipolar 1 disorder (HCC)  Total Time spent with patient: 20 minutes  Past Psychiatric History: See admission H&P  Past Medical History:  Past Medical History:  Diagnosis Date  . ADD 10/22/2009  . ALLERGIC RHINITIS 09/26/2008  . Anemia   . ANXIETY 09/26/2008  . Asthma   . Complication of anesthesia    "tempermental" per patient, "aggiated and hostile"  . CONSTIPATION 09/26/2008  . DEPRESSION 09/26/2008  . FATIGUE 01/14/2009  . Head injury with loss of consciousness (HCC)   . Headache   . HYPERLIPIDEMIA 09/26/2008  . IBS (irritable bowel syndrome)   . OTITIS MEDIA, LEFT 01/14/2009  . SKIN LESION 09/26/2008  . Sleep apnea   . TONSILLITIS, ACUTE 01/14/2009  . Unconscious state (HCC) 10/13/2017  . URI 10/22/2009    Past Surgical History:  Procedure Laterality Date  . CERVICAL POLYPECTOMY  2009  . CERVICAL POLYPECTOMY  2019  . DILATATION &  CURETTAGE/HYSTEROSCOPY WITH MYOSURE N/A 11/17/2017   Procedure: DILATATION & CURETTAGE/HYSTEROSCOPY WITH MYOSURE;  Surgeon: Geryl Rankins, MD;  Location: [facility];  Service: Gynecology;  Laterality: N/A;  . ORIF ANKLE FRACTURE Right 08/24/2018   Procedure: OPEN REDUCTION INTERNAL FIXATION TRIMALLEOLAR ANKLE FRACTURE;  Surgeon: Eldred Manges, MD;  Location: [facility] OR;  Service: Orthopedics;  Laterality: Right;   Family History:  Family History  Problem Relation Age of Onset  . Diabetes Other   . Hypertension Other   . Thyroid disease Mother   . Hypertension Mother    Family Psychiatric  History: See admission H&P Social History:  Social History   Substance and Sexual Activity  Alcohol Use Not Currently     Social History   Substance and Sexual Activity  Drug Use No    Social History   Socioeconomic History  . Marital status: Single    Spouse name: Not on file  . Number of children: Not on file  . Years of education: Not on file  . Highest education level: Not on file  Occupational History  . Occupation: full time waitress  Tobacco Use  . Smoking status: Current Every Day Smoker    Packs/day: 1.00    Years: 20.00    Pack years: 20.00    Types: Cigarettes  . Smokeless tobacco: Never Used  Vaping Use  . Vaping Use: Every day  Substance and Sexual Activity  . Alcohol use: Not Currently  . Drug use: No  . Sexual activity: Not Currently  Birth control/protection: Abstinence  Other Topics Concern  . Not on file  Social History Narrative   Lives with mother   Social Determinants of Health   Financial Resource Strain:   . Difficulty of Paying Living Expenses:   Food Insecurity:   . Worried About Programme researcher, broadcasting/film/video in the Last Year:   . Barista in the Last Year:   Transportation Needs:   . Freight forwarder (Medical):   Marland Kitchen Lack of Transportation (Non-Medical):   Physical Activity:   . Days of Exercise per Week:   . Minutes of Exercise per Session:    Stress:   . Feeling of Stress :   Social Connections:   . Frequency of Communication with Friends and Family:   . Frequency of Social Gatherings with Friends and Family:   . Attends Religious Services:   . Active Member of Clubs or Organizations:   . Attends Banker Meetings:   Marland Kitchen Marital Status:    Additional Social History:    Pain Medications: See MAR Prescriptions: See MAR Over the Counter: See MAR History of alcohol / drug use?: No history of alcohol / drug abuse                    Sleep: Good  Appetite:  Good  Current Medications: Current Facility-Administered Medications  Medication Dose Route Frequency Provider Last Rate Last Admin  . acetaminophen (TYLENOL) tablet 650 mg  650 mg Oral Q6H PRN Jackelyn Poling, NP   650 mg at 07/29/19 2008  . albuterol (VENTOLIN HFA) 108 (90 Base) MCG/ACT inhaler 1-2 puff  1-2 puff Inhalation Q6H PRN Antonieta Pert, MD      . alum & mag hydroxide-simeth (MAALOX/MYLANTA) 200-200-20 MG/5ML suspension 30 mL  30 mL Oral Q4H PRN Nira Conn A, NP      . benztropine (COGENTIN) tablet 1 mg  1 mg Oral BID PRN Antonieta Pert, MD      . carbamazepine (TEGRETOL) chewable tablet 100 mg  100 mg Oral Daily Antonieta Pert, MD   100 mg at 07/30/19 0749  . carbamazepine (TEGRETOL) chewable tablet 200 mg  200 mg Oral QHS Antonieta Pert, MD   200 mg at 07/29/19 2008  . hydrOXYzine (ATARAX/VISTARIL) tablet 25 mg  25 mg Oral TID PRN Jackelyn Poling, NP   25 mg at 07/29/19 2108  . magnesium hydroxide (MILK OF MAGNESIA) suspension 30 mL  30 mL Oral Daily PRN Nira Conn A, NP   30 mL at 07/26/19 0751  . nicotine polacrilex (NICORETTE) gum 2 mg  2 mg Oral PRN Antonieta Pert, MD   2 mg at 07/30/19 1029  . risperiDONE (RISPERDAL M-TABS) disintegrating tablet 1 mg  1 mg Oral Once Nira Conn A, NP      . risperiDONE (RISPERDAL M-TABS) disintegrating tablet 2 mg  2 mg Oral Daily Antonieta Pert, MD   2 mg at 07/30/19 0749   . risperiDONE (RISPERDAL M-TABS) disintegrating tablet 4 mg  4 mg Oral QHS Antonieta Pert, MD   4 mg at 07/29/19 2108  . traZODone (DESYREL) tablet 50 mg  50 mg Oral QHS PRN Jackelyn Poling, NP   50 mg at 07/29/19 2108    Lab Results:  Results for orders placed or performed during the hospital encounter of 07/24/19 (from the past 48 hour(s))  Urinalysis, Complete w Microscopic     Status: Abnormal   Collection Time:  07/28/19  6:26 PM  Result Value Ref Range   Color, Urine STRAW (A) YELLOW   APPearance CLEAR CLEAR   Specific Gravity, Urine 1.008 1.005 - 1.030   pH 7.0 5.0 - 8.0   Glucose, UA NEGATIVE NEGATIVE mg/dL   Hgb urine dipstick NEGATIVE NEGATIVE   Bilirubin Urine NEGATIVE NEGATIVE   Ketones, ur NEGATIVE NEGATIVE mg/dL   Protein, ur NEGATIVE NEGATIVE mg/dL   Nitrite NEGATIVE NEGATIVE   Leukocytes,Ua NEGATIVE NEGATIVE   RBC / HPF 0-5 0 - 5 RBC/hpf   WBC, UA 0-5 0 - 5 WBC/hpf   Bacteria, UA MANY (A) NONE SEEN   Squamous Epithelial / LPF 0-5 0 - 5    Comment: Performed at [facility], [address], [place], [place] [address]  RPR     Status: None   Collection Time: 07/29/19  6:31 AM  Result Value Ref Range   RPR Ser Ql NON REACTIVE NON REACTIVE    Comment: Performed at [facility], [address] [place], [place] [address]  Carbamazepine level, total     Status: None   Collection Time: 07/29/19  6:31 AM  Result Value Ref Range   Carbamazepine Lvl 6.8 4.0 - 12.0 ug/mL    Comment: Performed at [facility], [address], [place], [place] [address]  Hepatic function panel     Status: None   Collection Time: 07/29/19  6:31 AM  Result Value Ref Range   Total Protein 7.0 6.5 - 8.1 g/dL   Albumin 4.2 3.5 - 5.0 g/dL   AST 21 15 - 41 U/L   ALT 30 0 - 44 U/L   Alkaline Phosphatase 111 38 - 126 U/L   Total Bilirubin 0.4 0.3 - 1.2 mg/dL   Bilirubin, Direct 0.1 0.0 - 0.2 mg/dL   Indirect Bilirubin 0.3 0.3 - 0.9 mg/dL    Comment: Performed  at [facility], [address] [place], [place] [address]  CBC with Differential/Platelet     Status: None   Collection Time: 07/29/19  6:31 AM  Result Value Ref Range   WBC 8.4 4.0 - 10.5 K/uL   RBC 4.35 3.87 - 5.11 MIL/uL   Hemoglobin 13.2 12.0 - 15.0 g/dL   HCT 94.5 36 - 46 %   MCV 95.9 80.0 - 100.0 fL   MCH 30.3 26.0 - 34.0 pg   MCHC 31.7 30.0 - 36.0 g/dL   RDW 03.8 88.2 - 80.0 %   Platelets 240 150 - 400 K/uL   nRBC 0.0 0.0 - 0.2 %   Neutrophils Relative % 55 %   Neutro Abs 4.8 1.7 - 7.7 K/uL   Lymphocytes Relative 36 %   Lymphs Abs 3.0 0.7 - 4.0 K/uL   Monocytes Relative 6 %   Monocytes Absolute 0.5 0 - 1 K/uL   Eosinophils Relative 2 %   Eosinophils Absolute 0.1 0 - 0 K/uL   Basophils Relative 1 %   Basophils Absolute 0.0 0 - 0 K/uL   Immature Granulocytes 0 %   Abs Immature Granulocytes 0.02 0.00 - 0.07 K/uL    Comment: Performed at [facility], [address], [place], [place] [address]    Blood Alcohol level:  Lab Results  Component Value Date   [facility] <10 07/23/2019   ETH <10 11/14/2018    Metabolic Disorder Labs: Lab Results  Component Value Date   HGBA1C 5.4 07/25/2019   MPG 108 07/25/2019  MPG 99.67 05/26/2018   No results found for: PROLACTIN Lab Results  Component Value Date   CHOL 140 07/25/2019   TRIG 84 07/25/2019   HDL 43 07/25/2019   CHOLHDL 3.3 07/25/2019   VLDL 17 07/25/2019   LDLCALC 80 07/25/2019   LDLCALC 71 05/26/2018    Physical Findings: AIMS: Facial and Oral Movements Muscles of Facial Expression: None, normal Lips and Perioral Area: None, normal Jaw: None, normal Tongue: None, normal,Extremity Movements Upper (arms, wrists, hands, fingers): None, normal Lower (legs, knees, ankles, toes): None, normal, Trunk Movements Neck, shoulders, hips: None, normal, Overall Severity Severity of abnormal movements (highest score from questions above): None, normal Incapacitation due to abnormal  movements: None, normal Patient's awareness of abnormal movements (rate only patient's report): No Awareness, Dental Status Current problems with teeth and/or dentures?: No Does patient usually wear dentures?: No  CIWA:  CIWA-Ar Total: 4 COWS:  COWS Total Score: 3  Musculoskeletal: Strength & Muscle Tone: within normal limits Gait & Station: normal Patient leans: N/A  Psychiatric Specialty Exam: Physical Exam Vitals and nursing note reviewed.  Constitutional:      Appearance: Normal appearance. She is obese.  HENT:     Head: Normocephalic.  Pulmonary:     Effort: Pulmonary effort is normal.  Neurological:     General: No focal deficit present.     Mental Status: She is alert and oriented to person, place, and time.     Review of Systems  Blood pressure 127/72, pulse (!) 139, temperature (!) 97.3 F (36.3 C), temperature source Oral, resp. rate 20, height 5\' 9"  (1.753 m), weight 116.1 kg, SpO2 97 %.Body mass index is 37.8 kg/m.  General Appearance: Casual  Eye Contact:  Fair  Speech:  Normal Rate  Volume:  Normal  Mood:  Anxious  Affect:  Congruent  Thought Process:  Coherent and Descriptions of Associations: Intact  Orientation:  Full (Time, Place, and Person)  Thought Content:  Delusions  Suicidal Thoughts:  No  Homicidal Thoughts:  No  Memory:  Immediate;   Fair Recent;   Fair Remote;   Fair  Judgement:  Intact  Insight:  Fair  Psychomotor Activity:  Increased  Concentration:  Concentration: Fair and Attention Span: Fair  Recall:  of Knowledge:  Fair  Language:  Fair  Akathisia:  Negative  Handed:  Right  AIMS (if indicated):     Assets:  Desire for Improvement Housing Resilience Social Support Talents/Skills  ADL's:  Intact  Cognition:  WNL  Sleep:  Number of Hours: 6.75     Treatment Plan Summary: Daily contact with patient to assess and evaluate symptoms and progress in treatment, Medication management and Plan : Patient is seen and  examined.  Patient is a 39 year old female with the above-stated past psychiatric history who is seen in follow-up.   Diagnosis: 1. Schizoaffective disorder; bipolar type versus bipolar disorder with psychotic features  Pertinent findings on examination today: 1.  Decreased preoccupation with internal systems, and continues to slowly improve. 2.  Continued paranoid thinking, but continues to slowly decrease. 3.  Decreased somatic delusional thinking. 4.  Sleep continues to be stable. 5.  Compliance with current medications.  Plan: 1.  Continue Cogentin 1 mg p.o. twice daily as needed tremors. 2.  Continue Tegretol 100 mg p.o. daily and 200 mg p.o. nightly for mood stability. 3.  Encourage patient to have EKG done today. 4.  RPR negative. 5.  Continue Risperdal 2 mg p.o. daily and  4 mg p.o. nightly for mood stability and psychosis. 6.  Continue trazodone 50 mg p.o. nightly as needed insomnia. 7.  Disposition planning-in progress.  Antonieta PertGreg Lawson Shawnell Dykes, MD 07/30/2019, 12:28 PM

## 2019-07-30 NOTE — Progress Notes (Signed)
   07/30/19 2255  COVID-19 Daily Checkoff  Have you had a fever (temp > 37.80C/100F)  in the past 24 hours?  No  If you have had runny nose, nasal congestion, sneezing in the past 24 hours, has it worsened? No  COVID-19 EXPOSURE  Have you traveled outside the state in the past 14 days? No  Have you been in contact with someone with a confirmed diagnosis of COVID-19 or PUI in the past 14 days without wearing appropriate PPE? No  Have you been living in the same home as a person with confirmed diagnosis of COVID-19 or a PUI (household contact)? No  Have you been diagnosed with COVID-19? No

## 2019-07-30 NOTE — Progress Notes (Signed)
Patient moved to [address] per MD's order.

## 2019-07-30 NOTE — Progress Notes (Signed)
While this Clinical research associate was writing last note, Pt came to nurses station and requested to wait until tomorrow to go to ED.  Pt states that she feels she would not benefit from sitting in ED for a long time and feels that this may be the case since this is a holiday night.  AC and NP notified and order to transfer to ED canceled.  Pt is to let staff know immediately if she notices any worsening of symptoms.  Pt verbalized understanding.  Pt's foot elevated and ice packs applied.  Pt states that this made a "huge difference" and felt "much better".  Pt instructed to call staff for assistance if needed.  Pt resting in bed in no acute distress presently, will continue to monitor closely.

## 2019-07-30 NOTE — Progress Notes (Signed)
Pt requested that RN look at her right foot as it was swollen and painful.  Pt states that she is concerned that she may have re-injured it, as it was broken last year in August and required surgery.  Pt states it has been getting progressively worse.  NP notified and order received to send Pt to [facility] to be checked out.  AC notified.

## 2019-07-31 ENCOUNTER — Ambulatory Visit (HOSPITAL_COMMUNITY): Payer: Self-pay | Attending: Psychiatry

## 2019-07-31 MED ORDER — HYDROCHLOROTHIAZIDE 12.5 MG PO CAPS
12.5000 mg | ORAL_CAPSULE | Freq: Once | ORAL | Status: AC
  Administered 2019-07-31: 12.5 mg via ORAL
  Filled 2019-07-31: qty 1

## 2019-07-31 NOTE — Progress Notes (Signed)
   07/31/19 1140  Psych Admission Type (Psych Patients Only)  Admission Status Involuntary  Psychosocial Assessment  Patient Complaints Anxiety  Eye Contact Fair  Facial Expression Flat  Affect Appropriate to circumstance  Speech Logical/coherent  Interaction Assertive  Motor Activity Slow  Appearance/Hygiene Unremarkable  Behavior Characteristics Cooperative  Mood Anxious;Labile  Thought Process  Content Preoccupation  Delusions Paranoid  Perception Hallucinations  Hallucination Auditory  Judgment Poor  Confusion None  Danger to Self  Current suicidal ideation? Denies  Danger to Others  Danger to Others None reported or observed   Pt presents with flat affect, anxious and irritable mood. Reports poor sleep last night "I did not sleep because of my right ankle pai 6/10. I'm willing to get it checked out. I need you guys to do something about it because the swelling is bad". Rates her depression, hopelessness and anxiety all 4/10. States her appetite is fair with normal energy and good concentration level. Pt's goal for today is "staying focused on going home". 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  Emotional support and encouragement offered to pt throughout this shift as needed.Treatment team made aware of pt's concern related to right ankle pain. Order received for X-ray and pt was escorted to [facility] for procedure by Clinical research associate. Scheduled and PRN medications given as ordered and effects monitored. Safety checks maintained without self harm gestures.  Pt tolerates all PO intake well. Off unit for meals and procedure; returned without issues and tolerated procedure well. Report decrease in pain level when reassessed.

## 2019-07-31 NOTE — Progress Notes (Signed)
Recreation Therapy Notes  Date:  7.5.21 Time: 0930 Location: [address] Day[facility]  Group Topic: Stress Management  Goal Area(s) Addresses:  Patient will identify positive stress management techniques. Patient will identify benefits of using stress management post d/c.  Intervention: Stress Management  Activity: Meditation.  LRT played Jimenez meditation that focused on being resilient in the face of change.  Patients were to listen and follow along as meditation played in order to engage in activity.    Education:  Stress Management, Discharge Planning.   Education Outcome: Acknowledges Education  Clinical Observations/Feedback: Pt did not attend group activity.    [name], LRT/CTRS         [name] 07/31/2019 11:08 AM 

## 2019-07-31 NOTE — Progress Notes (Signed)
[facility] MD Progress Note  07/31/2019 1:01 PM [name]  MRN:  [id number]  Patient is a 39 year old female with a past psychiatric history significant for reported bipolar disorder who was involuntarily committed after noncompliance with medications and developing somatic delusions.  Ms. Fede found lying in bed. She reports mood is "pretty good." She continues to be concerned about swelling in her right ankle. She had surgery in September of 2020 but states the swelling has worsened since she has been in the hospital. She states the pain is well-managed with Tylenol. She reports fair sleep, with 6 hours recorded overnight. She does not appear paranoid. EKG done yesterday with NSR, QTC 445. She denies SI/HI/AVH.   Principal Problem: <principal problem not specified> Diagnosis: Active Problems:   Bipolar 1 disorder (HCC)  Total Time spent with patient: 15 minutes  Past Psychiatric History: See admission H&P  Past Medical History:  Past Medical History:  Diagnosis Date  . ADD 10/22/2009  . ALLERGIC RHINITIS 09/26/2008  . Anemia   . ANXIETY 09/26/2008  . Asthma   . Complication of anesthesia    "tempermental" per patient, "aggiated and hostile"  . CONSTIPATION 09/26/2008  . DEPRESSION 09/26/2008  . FATIGUE 01/14/2009  . Head injury with loss of consciousness (HCC)   . Headache   . HYPERLIPIDEMIA 09/26/2008  . IBS (irritable bowel syndrome)   . OTITIS MEDIA, LEFT 01/14/2009  . SKIN LESION 09/26/2008  . Sleep apnea   . TONSILLITIS, ACUTE 01/14/2009  . Unconscious state (HCC) 10/13/2017  . URI 10/22/2009    Past Surgical History:  Procedure Laterality Date  . CERVICAL POLYPECTOMY  2009  . CERVICAL POLYPECTOMY  2019  . DILATATION & CURETTAGE/HYSTEROSCOPY WITH MYOSURE N/A 11/17/2017   Procedure: DILATATION & CURETTAGE/HYSTEROSCOPY WITH MYOSURE;  Surgeon: Geryl Rankins, MD;  Location: [facility];  Service: Gynecology;  Laterality: N/A;  . ORIF ANKLE FRACTURE Right 08/24/2018   Procedure: OPEN  REDUCTION INTERNAL FIXATION TRIMALLEOLAR ANKLE FRACTURE;  Surgeon: Eldred Manges, MD;  Location: [facility];  Service: Orthopedics;  Laterality: Right;   Family History:  Family History  Problem Relation Age of Onset  . Diabetes Other   . Hypertension Other   . Thyroid disease Mother   . Hypertension Mother    Family Psychiatric  History: See admission H&P Social History:  Social History   Substance and Sexual Activity  Alcohol Use Not Currently     Social History   Substance and Sexual Activity  Drug Use No    Social History   Socioeconomic History  . Marital status: Single    Spouse name: Not on file  . Number of children: Not on file  . Years of education: Not on file  . Highest education level: Not on file  Occupational History  . Occupation: full time waitress  Tobacco Use  . Smoking status: Current Every Day Smoker    Packs/day: 1.00    Years: 20.00    Pack years: 20.00    Types: Cigarettes  . Smokeless tobacco: Never Used  Vaping Use  . Vaping Use: Every day  Substance and Sexual Activity  . Alcohol use: Not Currently  . Drug use: No  . Sexual activity: Not Currently    Birth control/protection: Abstinence  Other Topics Concern  . Not on file  Social History Narrative   Lives with mother   Social Determinants of Health   Financial Resource Strain:   . Difficulty of Paying Living Expenses:   Food Insecurity:   .  Worried About Programme researcher, broadcasting/film/video in the Last Year:   . Barista in the Last Year:   Transportation Needs:   . Freight forwarder (Medical):   Marland Kitchen Lack of Transportation (Non-Medical):   Physical Activity:   . Days of Exercise per Week:   . Minutes of Exercise per Session:   Stress:   . Feeling of Stress :   Social Connections:   . Frequency of Communication with Friends and Family:   . Frequency of Social Gatherings with Friends and Family:   . Attends Religious Services:   . Active Member of Clubs or Organizations:   . Attends  Banker Meetings:   Marland Kitchen Marital Status:    Additional Social History:    Pain Medications: See MAR Prescriptions: See MAR Over the Counter: See MAR History of alcohol / drug use?: No history of alcohol / drug abuse                    Sleep: Good  Appetite:  Good  Current Medications: Current Facility-Administered Medications  Medication Dose Route Frequency Provider Last Rate Last Admin  . acetaminophen (TYLENOL) tablet 650 mg  650 mg Oral Q6H PRN Nira Conn A, NP   650 mg at 07/31/19 0817  . albuterol (VENTOLIN HFA) 108 (90 Base) MCG/ACT inhaler 1-2 puff  1-2 puff Inhalation Q6H PRN Antonieta Pert, MD      . alum & mag hydroxide-simeth (MAALOX/MYLANTA) 200-200-20 MG/5ML suspension 30 mL  30 mL Oral Q4H PRN Nira Conn A, NP      . benztropine (COGENTIN) tablet 1 mg  1 mg Oral BID PRN Antonieta Pert, MD      . carbamazepine (TEGRETOL) chewable tablet 100 mg  100 mg Oral Daily Antonieta Pert, MD   100 mg at 07/31/19 0817  . carbamazepine (TEGRETOL) chewable tablet 200 mg  200 mg Oral QHS Antonieta Pert, MD   200 mg at 07/30/19 2110  . hydrOXYzine (ATARAX/VISTARIL) tablet 25 mg  25 mg Oral TID PRN Jackelyn Poling, NP   25 mg at 07/30/19 2110  . magnesium hydroxide (MILK OF MAGNESIA) suspension 30 mL  30 mL Oral Daily PRN Nira Conn A, NP   30 mL at 07/26/19 0751  . nicotine polacrilex (NICORETTE) gum 2 mg  2 mg Oral PRN Antonieta Pert, MD   2 mg at 07/31/19 1233  . risperiDONE (RISPERDAL M-TABS) disintegrating tablet 1 mg  1 mg Oral Once Nira Conn A, NP      . risperiDONE (RISPERDAL M-TABS) disintegrating tablet 2 mg  2 mg Oral Daily Antonieta Pert, MD   2 mg at 07/31/19 0817  . risperiDONE (RISPERDAL M-TABS) disintegrating tablet 4 mg  4 mg Oral QHS Antonieta Pert, MD   4 mg at 07/30/19 2110  . traZODone (DESYREL) tablet 50 mg  50 mg Oral QHS PRN Nira Conn A, NP   50 mg at 07/30/19 2110    Lab Results: No results found for this  or any previous visit (from the past 48 hour(s)).  Blood Alcohol level:  Lab Results  Component Value Date   [facility] <10 07/23/2019   ETH <10 11/14/2018    Metabolic Disorder Labs: Lab Results  Component Value Date   HGBA1C 5.4 07/25/2019   MPG 108 07/25/2019   MPG 99.67 05/26/2018   No results found for: PROLACTIN Lab Results  Component Value Date   CHOL 140 07/25/2019  TRIG 84 07/25/2019   HDL 43 07/25/2019   CHOLHDL 3.3 07/25/2019   VLDL 17 07/25/2019   LDLCALC 80 07/25/2019   LDLCALC 71 05/26/2018    Physical Findings: AIMS: Facial and Oral Movements Muscles of Facial Expression: None, normal Lips and Perioral Area: None, normal Jaw: None, normal Tongue: None, normal,Extremity Movements Upper (arms, wrists, hands, fingers): None, normal Lower (legs, knees, ankles, toes): None, normal, Trunk Movements Neck, shoulders, hips: None, normal, Overall Severity Severity of abnormal movements (highest score from questions above): None, normal Incapacitation due to abnormal movements: None, normal Patient's awareness of abnormal movements (rate only patient's report): No Awareness, Dental Status Current problems with teeth and/or dentures?: No Does patient usually wear dentures?: No  CIWA:  CIWA-Ar Total: 4 COWS:  COWS Total Score: 3  Musculoskeletal: Strength & Muscle Tone: within normal limits Gait & Station: normal Patient leans: N/A  Psychiatric Specialty Exam: Physical Exam Vitals and nursing note reviewed.  Constitutional:      Appearance: She is well-developed.  Pulmonary:     Effort: Pulmonary effort is normal.  Neurological:     Mental Status: She is alert and oriented to person, place, and time.     Review of Systems  Constitutional: Negative.   Respiratory: Negative for cough and shortness of breath.   Psychiatric/Behavioral: Negative for agitation, behavioral problems, confusion, decreased concentration, hallucinations, self-injury, sleep disturbance  and suicidal ideas. The patient is not nervous/anxious and is not hyperactive.     Blood pressure 127/72, pulse (!) 139, temperature (!) 97.3 F (36.3 C), temperature source Oral, resp. rate 20, height 5\' 9"  (1.753 m), weight 116.1 kg, SpO2 97 %.Body mass index is 37.8 kg/m.  General Appearance: Fairly Groomed  Eye Contact:  Fair  Speech:  Normal Rate  Volume:  Normal  Mood:  Euthymic  Affect:  Flat  Thought Process:  Coherent  Orientation:  Full (Time, Place, and Person)  Thought Content:  Logical  Suicidal Thoughts:  No  Homicidal Thoughts:  No  Memory:  Immediate;   Fair Recent;   Fair  Judgement:  Intact  Insight:  Fair  Psychomotor Activity:  Normal  Concentration:  Concentration: Fair and Attention Span: Fair  Recall:  of Knowledge:  Fair  Language:  Fair  Akathisia:  No  Handed:  Right  AIMS (if indicated):     Assets:  Communication Skills Desire for Improvement Housing Resilience  ADL's:  Intact  Cognition:  WNL  Sleep:  Number of Hours: 6     Treatment Plan Summary: Daily contact with patient to assess and evaluate symptoms and progress in treatment and Medication management   Continue inpatient hospitalization.  One-time dose of HCTZ 12.5 mg PO given for right lower extremity edema Continue Cogentin 1 mg p.o. twice daily as needed tremors. Continue Tegretol 100 mg p.o. daily and 200 mg p.o. nightly for mood stability. Continue Risperdal 2 mg p.o. daily and 4 mg p.o. nightly for mood stability and psychosis Continue trazodone 50 mg p.o. nightly as needed insomnia.  Patient will participate in the therapeutic group milieu.  Discharge disposition in progress.   [employer], NP 07/31/2019, 1:01 PM

## 2019-07-31 NOTE — Progress Notes (Signed)
   07/31/19 2200  Psych Admission Type (Psych Patients Only)  Admission Status Involuntary  Psychosocial Assessment  Patient Complaints Anxiety  Eye Contact Fair  Facial Expression Flat  Affect Appropriate to circumstance  Speech Logical/coherent  Interaction Assertive  Motor Activity Slow  Appearance/Hygiene Unremarkable  Behavior Characteristics Cooperative  Mood Anxious  Thought Process  Content Preoccupation  Delusions Paranoid  Perception Hallucinations  Hallucination Auditory  Judgment Poor  Confusion None  Danger to Self  Current suicidal ideation? Denies  Danger to Others  Danger to Others None reported or observed

## 2019-07-31 NOTE — [facility] Group Notes (Signed)
Adult Psychoeducational Group Note  Date:  07/31/2019 Time:  9:55 PM  Group Topic/Focus:  Wrap-Up Group:   The focus of this group is to help patients review their daily goal of treatment and discuss progress on daily workbooks.  Participation Level:  Active  Participation Quality:  Appropriate and Attentive  Affect:  Appropriate  Cognitive:  Alert and Appropriate  Insight: Appropriate and Good  Engagement in Group:  Engaged  Modes of Intervention:  Discussion and Education  Additional Comments:  Pt attended and participated in wrap up group this evening and rated their day a 7/10, due to them getting X-rays on their injured ankle. Pt goal was to work on discharge plans.   Chrisandra Netters 07/31/2019, 9:55 PM

## 2019-07-31 NOTE — Progress Notes (Signed)
Pt did attend the evening wrap up group. Positive thinking and positive change were discussed. Pt was attentive, sharing, and supportive.  

## 2019-08-01 MED ORDER — NICOTINE POLACRILEX 2 MG MT GUM: 2.0000 mg | CHEWING_GUM | OROMUCOSAL | 0 refills | Status: DC | PRN

## 2019-08-01 MED ORDER — BENZTROPINE MESYLATE 1 MG PO TABS: 1.0000 mg | ORAL_TABLET | Freq: Two times a day (BID) | ORAL | 0 refills | Status: DC | PRN

## 2019-08-01 MED ORDER — CARBAMAZEPINE 100 MG PO CHEW: CHEWABLE_TABLET | ORAL | 0 refills | Status: DC

## 2019-08-01 MED ORDER — RISPERIDONE 4 MG PO TBDP: 4.0000 mg | ORAL_TABLET | Freq: Every day | ORAL | 0 refills | Status: DC

## 2019-08-01 MED ORDER — CARBAMAZEPINE 100 MG PO CHEW: 100.0000 mg | CHEWABLE_TABLET | Freq: Every day | ORAL | 0 refills | Status: DC

## 2019-08-01 MED ORDER — TRAZODONE HCL 50 MG PO TABS: 50.0000 mg | ORAL_TABLET | Freq: Every evening | ORAL | 0 refills | Status: DC | PRN

## 2019-08-01 MED ORDER — RISPERIDONE 2 MG PO TBDP: 2.0000 mg | ORAL_TABLET | Freq: Every day | ORAL | 0 refills | Status: DC

## 2019-08-01 MED ORDER — HYDROXYZINE HCL 25 MG PO TABS: 25.0000 mg | ORAL_TABLET | Freq: Three times a day (TID) | ORAL | 0 refills | Status: DC | PRN

## 2019-08-01 MED ORDER — CARBAMAZEPINE 100 MG PO CHEW: 200.0000 mg | CHEWABLE_TABLET | Freq: Every day | ORAL | 0 refills | Status: DC

## 2019-08-01 NOTE — Discharge Summary (Signed)
Physician Discharge Summary Note  Patient:  [name] is an 39 y.o., female MRN:  [id number] DOB:  Jan 29, 1980 Patient phone:  [phone] (home)  Patient address:   [address] [place] [address],  Total Time spent with patient: 15 minutes  Date of Admission:  07/24/2019 Date of Discharge: 08/01/19  Reason for Admission:  psychosis  Principal Problem: <principal problem not specified> Discharge Diagnoses: Active Problems:   Bipolar 1 disorder [facility])   Past Psychiatric History: Patient has had several psychiatric hospitalizations in the past. Her most recent at our facility was on 11/18/2018. She has been diagnosed with bipolar disorder. She also says she has attention deficit hyperactivity disorder.  Past Medical History:  Past Medical History:  Diagnosis Date  . ADD 10/22/2009  . ALLERGIC RHINITIS 09/26/2008  . Anemia   . ANXIETY 09/26/2008  . Asthma   . Complication of anesthesia    "tempermental" per patient, "aggiated and hostile"  . CONSTIPATION 09/26/2008  . DEPRESSION 09/26/2008  . FATIGUE 01/14/2009  . Head injury with loss of consciousness (HCC)   . Headache   . HYPERLIPIDEMIA 09/26/2008  . IBS (irritable bowel syndrome)   . OTITIS MEDIA, LEFT 01/14/2009  . SKIN LESION 09/26/2008  . Sleep apnea   . TONSILLITIS, ACUTE 01/14/2009  . Unconscious state (HCC) 10/13/2017  . URI 10/22/2009    Past Surgical History:  Procedure Laterality Date  . CERVICAL POLYPECTOMY  2009  . CERVICAL POLYPECTOMY  2019  . DILATATION & CURETTAGE/HYSTEROSCOPY WITH MYOSURE N/A 11/17/2017   Procedure: DILATATION & CURETTAGE/HYSTEROSCOPY WITH MYOSURE;  Surgeon: Geryl Rankins, MD;  Location: [facility];  Service: Gynecology;  Laterality: N/A;  . ORIF ANKLE FRACTURE Right 08/24/2018   Procedure: OPEN REDUCTION INTERNAL FIXATION TRIMALLEOLAR ANKLE FRACTURE;  Surgeon: Eldred Manges, MD;  Location: [facility] OR;  Service: Orthopedics;  Laterality: Right;   Family History:  Family History   Problem Relation Age of Onset  . Diabetes Other   . Hypertension Other   . Thyroid disease Mother   . Hypertension Mother    Family Psychiatric  History: Denies Social History:  Social History   Substance and Sexual Activity  Alcohol Use Not Currently     Social History   Substance and Sexual Activity  Drug Use No    Social History   Socioeconomic History  . Marital status: Single    Spouse name: Not on file  . Number of children: Not on file  . Years of education: Not on file  . Highest education level: Not on file  Occupational History  . Occupation: full time waitress  Tobacco Use  . Smoking status: Current Every Day Smoker    Packs/day: 1.00    Years: 20.00    Pack years: 20.00    Types: Cigarettes  . Smokeless tobacco: Never Used  Vaping Use  . Vaping Use: Every day  Substance and Sexual Activity  . Alcohol use: Not Currently  . Drug use: No  . Sexual activity: Not Currently    Birth control/protection: Abstinence  Other Topics Concern  . Not on file  Social History Narrative   Lives with mother   Social Determinants of Health   Financial Resource Strain:   . Difficulty of Paying Living Expenses:   Food Insecurity:   . Worried About Programme researcher, broadcasting/film/video in the Last Year:   . Barista in the Last Year:   Transportation Needs:   . Freight forwarder (Medical):   Marland Kitchen  Lack of Transportation (Non-Medical):   Physical Activity:   . Days of Exercise per Week:   . Minutes of Exercise per Session:   Stress:   . Feeling of Stress :   Social Connections:   . Frequency of Communication with Friends and Family:   . Frequency of Social Gatherings with Friends and Family:   . Attends Religious Services:   . Active Member of Clubs or Organizations:   . Attends Banker Meetings:   Marland Kitchen Marital Status:     Hospital Course:  From admission H&P: Patient is a 39 year old female who presented to the [facility] emergency department on  voluntary basis with apparently somatic delusions. She lives with her mother in [place]. She has been previously diagnosed with bipolar disorder. She has not been going to see the outpatient psychiatrist and has not been taking her medications. Her last admission was in October 2020. She stated in the emergency department she was concerned about arsenic on her skin. She stated she had arsenic on her skin for months. She wanted to get help getting the arsenic off her skin. She denied any homicidal ideation suicidal ideation. On examination this morning she really refuses to answer any questions. She stated that she does needs "alone time to get my thoughts together". She would not answer questions with regard to follow-up, compliance with medications, auditory or visual hallucinations or homicidal or suicidal ideation. She was admitted to the hospital for evaluation and stabilization. I actually admitted the patient on 11/18/2018. At that time she had been not eating, not sleeping or attending to her hygiene. She began going to peoples houses at 3:00 in the morning. She had also gone back to taking Adderall. She was hospitalized for 6 days, and her discharge medications included Cogentin, Tegretol, paliperidone, Risperdal. The paliperidone was the long-acting injectable form.  [name] was admitted for somatic delusions with paranoia. She remained on the [facility] unit for eight days. She was re-started on Tegretol, Risperdal, and Cogentin. She participated in group therapy on the unit. She responded well to treatment with no adverse effects reported. She has shown improved mood, affect, sleep, and interaction. She shows no signs of paranoia or of responding to internal stimuli. She denies any SI/HI/AVH and contracts for safety. She expressed concern about right ankle pain/swelling during admission. She has history of right ankle surgery in 2020. X-ray of right ankle on 07/31/19 done with no acute  findings. She is discharging on the medications listed below. She agrees to follow up at the [facility] (see below). Patient is provided with prescriptions and medication samples upon discharge. She is discharging home via personal transportation.  Physical Findings: AIMS: Facial and Oral Movements Muscles of Facial Expression: None, normal Lips and Perioral Area: None, normal Jaw: None, normal Tongue: None, normal,Extremity Movements Upper (arms, wrists, hands, fingers): None, normal Lower (legs, knees, ankles, toes): None, normal, Trunk Movements Neck, shoulders, hips: None, normal, Overall Severity Severity of abnormal movements (highest score from questions above): None, normal Incapacitation due to abnormal movements: None, normal Patient's awareness of abnormal movements (rate only patient's report): No Awareness, Dental Status Current problems with teeth and/or dentures?: No Does patient usually wear dentures?: No  CIWA:  CIWA-Ar Total: 4 COWS:  COWS Total Score: 3  Musculoskeletal: Strength & Muscle Tone: within normal limits Gait & Station: normal Patient leans: N/A  Psychiatric Specialty Exam: Physical Exam Vitals and nursing note reviewed.  Constitutional:      Appearance: She  is well-developed.  Cardiovascular:     Rate and Rhythm: Normal rate.  Pulmonary:     Effort: Pulmonary effort is normal.  Neurological:     Mental Status: She is alert and oriented to person, place, and time.     Review of Systems  Constitutional: Negative.   Respiratory: Negative for cough and shortness of breath.   Psychiatric/Behavioral: Negative for agitation, behavioral problems, confusion, dysphoric mood, hallucinations, self-injury, sleep disturbance and suicidal ideas. The patient is not nervous/anxious and is not hyperactive.     Blood pressure 100/64, pulse (!) 135, temperature (!) 97.5 F (36.4 C), temperature source Oral, resp. rate 16, height 5\' 9"  (1.753 m), weight  116.1 kg, SpO2 97 %.Body mass index is 37.8 kg/m.  See MD's discharge SRA    Have you used any form of tobacco in the last 30 days? (Cigarettes, Smokeless Tobacco, Cigars, and/or Pipes): Yes  Has this patient used any form of tobacco in the last 30 days? (Cigarettes, Smokeless Tobacco, Cigars, and/or Pipes) Yes, a prescription for an FDA-approved medication for tobacco cessation was offered at discharge.   Blood Alcohol level:  Lab Results  Component Value Date   ETH <10 07/23/2019   ETH <10 11/14/2018    Metabolic Disorder Labs:  Lab Results  Component Value Date   HGBA1C 5.4 07/25/2019   MPG 108 07/25/2019   MPG 99.67 05/26/2018   No results found for: PROLACTIN Lab Results  Component Value Date   CHOL 140 07/25/2019   TRIG 84 07/25/2019   HDL 43 07/25/2019   CHOLHDL 3.3 07/25/2019   VLDL 17 07/25/2019   LDLCALC 80 07/25/2019   LDLCALC 71 05/26/2018    See Psychiatric Specialty Exam and Suicide Risk Assessment completed by Attending Physician prior to discharge.  Discharge destination:  Home  Is patient on multiple antipsychotic therapies at discharge:  No   Has Patient had three or more failed trials of antipsychotic monotherapy by history:  No  Recommended Plan for Multiple Antipsychotic Therapies: NA  Discharge Instructions    Discharge instructions   Complete by: As directed    Patient is instructed to take all prescribed medications as recommended. Report any side effects or adverse reactions to your outpatient psychiatrist. Patient is instructed to abstain from alcohol and illegal drugs while on prescription medications. In the event of worsening symptoms, patient is instructed to call the crisis hotline, 911, or go to the nearest emergency department for evaluation and treatment.     Allergies as of 08/01/2019   No Known Allergies     Medication List    STOP taking these medications   Invega Sustenna 156 MG/ML Susy injection Generic drug:  paliperidone   loratadine 10 MG tablet Commonly known as: CLARITIN   ondansetron 4 MG disintegrating tablet Commonly known as: Zofran ODT   risperiDONE 3 MG tablet Commonly known as: RISPERDAL Replaced by: risperiDONE 2 MG disintegrating tablet   traMADol 50 MG tablet Commonly known as: ULTRAM     TAKE these medications     Indication  albuterol 108 (90 Base) MCG/ACT inhaler Commonly known as: VENTOLIN HFA Inhale 2 puffs into the lungs every 6 (six) hours as needed for wheezing or shortness of breath.  Indication: Asthma   benztropine 1 MG tablet Commonly known as: COGENTIN Take 1 tablet (1 mg total) by mouth 2 (two) times daily as needed for tremors. What changed:   when to take this  reasons to take this  Indication: Extrapyramidal Reaction caused  by Medications   carbamazepine 100 MG chewable tablet Commonly known as: TEGRETOL Chew 1 tablet (100 mg) and 2 tablets (200 mg) at bedtime. What changed: additional instructions  Indication: Manic-Depression   hydrOXYzine 25 MG tablet Commonly known as: ATARAX/VISTARIL Take 1 tablet (25 mg total) by mouth 3 (three) times daily as needed for anxiety.  Indication: Feeling Anxious   nicotine polacrilex 2 MG gum Commonly known as: NICORETTE Take 1 each (2 mg total) by mouth as needed for smoking cessation.  Indication: Nicotine Addiction   risperiDONE 4 MG disintegrating tablet Commonly known as: RISPERDAL M-TABS Take 1 tablet (4 mg total) by mouth at bedtime.  Indication: MIXED BIPOLAR AFFECTIVE DISORDER   risperiDONE 2 MG disintegrating tablet Commonly known as: RISPERDAL M-TABS Take 1 tablet (2 mg total) by mouth daily. Start taking on: August 02, 2019 Replaces: risperiDONE 3 MG tablet  Indication: MIXED BIPOLAR AFFECTIVE DISORDER   traZODone 50 MG tablet Commonly known as: DESYREL Take 1 tablet (50 mg total) by mouth at bedtime as needed for sleep.  Indication: Trouble Sleeping       Follow-up Information     [facility] Follow up on 08/18/2019.   Why: You have an appointment for medication management on 08/18/19 at 9:30 am. You also have an appointment for therapy on 08/22/19 at 1:00 pm.  These are both Virtual appointments.  Contact information: [address]  [place] [address] [phone] FAX: [phone]              Follow-up recommendations: Activity as tolerated. Diet as recommended by primary care physician. Keep all scheduled follow-up appointments as recommended.   Comments:   Patient is instructed to take all prescribed medications as recommended. Report any side effects or adverse reactions to your outpatient psychiatrist. Patient is instructed to abstain from alcohol and illegal drugs while on prescription medications. In the event of worsening symptoms, patient is instructed to call the crisis hotline, 911, or go to the nearest emergency department for evaluation and treatment.  Signed: Aldean Baker, NP 08/01/2019, 11:12 AM

## 2019-08-01 NOTE — Plan of Care (Signed)
Discharge note  Patient verbalizes readiness for discharge. Follow up plan explained, AVS, Transition record and SRA given. Prescriptions and teaching provided. Belongings returned and signed for. Suicide safety plan completed and signed. Patient verbalizes understanding. Patient denies SI/HI and assures this Clinical research associate they will seek assistance should that change. Patient discharged to lobby where mother was waiting.  Problem: Coping: Goal: Ability to identify and develop effective coping behavior will improve Outcome: Adequate for Discharge   Problem: Education: Goal: Utilization of techniques to improve thought processes will improve Outcome: Adequate for Discharge Goal: Knowledge of the prescribed therapeutic regimen will improve Outcome: Adequate for Discharge   Problem: Activity: Goal: Interest or engagement in leisure activities will improve Outcome: Adequate for Discharge Goal: Imbalance in normal sleep/wake cycle will improve Outcome: Adequate for Discharge   Problem: Coping: Goal: Coping ability will improve Outcome: Adequate for Discharge Goal: Will verbalize feelings Outcome: Adequate for Discharge   Problem: Health Behavior/Discharge Planning: Goal: Ability to make decisions will improve Outcome: Adequate for Discharge Goal: Compliance with therapeutic regimen will improve Outcome: Adequate for Discharge   Problem: Role Relationship: Goal: Will demonstrate positive changes in social behaviors and relationships Outcome: Adequate for Discharge   Problem: Safety: Goal: Ability to disclose and discuss suicidal ideas will improve Outcome: Adequate for Discharge Goal: Ability to identify and utilize support systems that promote safety will improve Outcome: Adequate for Discharge   Problem: Self-Concept: Goal: Will verbalize positive feelings about self Outcome: Adequate for Discharge Goal: Level of anxiety will decrease Outcome: Adequate for Discharge   Problem:  Coping: Goal: Coping ability will improve Outcome: Adequate for Discharge   Problem: Medication: Goal: Compliance with prescribed medication regimen will improve Outcome: Adequate for Discharge   Problem: Education: Goal: Knowledge of [place] [facility] Education information/materials will improve Outcome: Adequate for Discharge Goal: Emotional status will improve Outcome: Adequate for Discharge Goal: Mental status will improve Outcome: Adequate for Discharge Goal: Verbalization of understanding the information provided will improve Outcome: Adequate for Discharge

## 2019-08-01 NOTE — [facility] Suicide Risk Assessment (Signed)
[facility] Discharge Suicide Risk Assessment   Principal Problem: <principal problem not specified> Discharge Diagnoses: Active Problems:   Bipolar 1 disorder (HCC)   Total Time spent with patient: 20 minutes  Musculoskeletal: Strength & Muscle Tone: within normal limits Gait & Station: normal Patient leans: N/A  Psychiatric Specialty Exam: Review of Systems  Musculoskeletal: Positive for arthralgias, joint swelling and myalgias.  All other systems reviewed and are negative.   Blood pressure 100/64, pulse (!) 135, temperature (!) 97.5 F (36.4 C), temperature source Oral, resp. rate 16, height 5\' 9"  (1.753 m), weight 116.1 kg, SpO2 97 %.Body mass index is 37.8 kg/m.  General Appearance: Casual  Eye Contact::  Good  Speech:  Normal Rate409  Volume:  Normal  Mood:  Euthymic  Affect:  Congruent  Thought Process:  Coherent and Descriptions of Associations: Intact  Orientation:  Full (Time, Place, and Person)  Thought Content:  Logical  Suicidal Thoughts:  No  Homicidal Thoughts:  No  Memory:  Immediate;   Good Recent;   Good Remote;   Good  Judgement:  Intact  Insight:  Fair  Psychomotor Activity:  Normal  Concentration:  Good  Recall:  Good  Fund of Knowledge:Good  Language: Good  Akathisia:  Negative  Handed:  Right  AIMS (if indicated):     Assets:  Desire for Improvement Housing Resilience Social Support  Sleep:  Number of Hours: 6.75  Cognition: WNL  ADL's:  Intact   Mental Status Per Nursing Assessment::   On Admission:  NA  Demographic Factors:  Caucasian and Unemployed  Loss Factors: NA  Historical Factors: Impulsivity  Risk Reduction Factors:   Living with another person, especially a relative and Positive social support  Continued Clinical Symptoms:  Bipolar Disorder:   Mixed State Schizophrenia:   Less than 19 years old Paranoid or undifferentiated type  Cognitive Features That Contribute To Risk:  None    Suicide Risk:  Minimal: No  identifiable suicidal ideation.  Patients presenting with no risk factors but with morbid ruminations; may be classified as minimal risk based on the severity of the depressive symptoms    Plan Of Care/Follow-up recommendations:  Activity:  ad lib  43, MD 08/01/2019, 8:24 AM

## 2019-08-01 NOTE — Progress Notes (Signed)
  [facility] Adult Case Management Discharge Plan :  Will you be returning to the same living situation after discharge:  Yes,  home At discharge, do you have transportation home?: Yes,  mother Do you have the ability to pay for your medications: Yes,  mental health facility  Release of information consent forms completed and in the chart;  Patient's signature needed at discharge.  Patient to Follow up at:  Follow-up Information    [facility] Follow up.   Why: Follow up within 5 days for your hospital follow up appointment Contact information: [address] [facility] [address] [phone] FAX: [phone]              Next level of care provider has access to [facility] Link:yes  Safety Planning and Suicide Prevention discussed: Yes,  yes  Has patient been referred to the Quitline?: N/A patient is not a smoker  Patient has been referred for addiction treatment: N/A  Ida Rogue, LCSW 08/01/2019, 10:31 AM

## 2019-08-18 ENCOUNTER — Other Ambulatory Visit: Payer: Self-pay

## 2019-08-18 ENCOUNTER — Telehealth (HOSPITAL_COMMUNITY): Payer: No Payment, Other | Admitting: Psychiatry

## 2019-08-22 ENCOUNTER — Other Ambulatory Visit: Payer: Self-pay

## 2019-08-22 ENCOUNTER — Ambulatory Visit (HOSPITAL_COMMUNITY): Payer: No Payment, Other | Admitting: Clinical

## 2020-05-22 ENCOUNTER — Other Ambulatory Visit: Payer: Self-pay

## 2020-05-24 ENCOUNTER — Ambulatory Visit: Payer: Self-pay | Admitting: Internal Medicine

## 2020-05-29 ENCOUNTER — Ambulatory Visit: Payer: Self-pay | Admitting: Internal Medicine

## 2020-06-21 ENCOUNTER — Emergency Department (HOSPITAL_COMMUNITY)
Admission: EM | Admit: 2020-06-21 | Discharge: 2020-06-21 | Disposition: A | Discharge status: Home / Self Care | Payer: Self-pay | Attending: Emergency Medicine | Admitting: Emergency Medicine

## 2020-06-21 ENCOUNTER — Encounter (HOSPITAL_COMMUNITY): Payer: Self-pay

## 2020-06-21 ENCOUNTER — Other Ambulatory Visit: Payer: Self-pay

## 2020-06-21 ENCOUNTER — Encounter (HOSPITAL_COMMUNITY): Payer: Self-pay | Admitting: Psychiatry

## 2020-06-21 ENCOUNTER — Inpatient Hospital Stay (HOSPITAL_COMMUNITY)
Admission: RE | Admit: 2020-06-21 | Discharge: 2020-07-01 | DRG: 885 | Disposition: A | Discharge status: Home / Self Care | Payer: Federal, State, Local not specified - Other | Attending: Psychiatry | Admitting: Psychiatry

## 2020-06-21 ENCOUNTER — Other Ambulatory Visit: Payer: Self-pay | Admitting: Psychiatry

## 2020-06-21 DIAGNOSIS — F411 Generalized anxiety disorder: Secondary | ICD-10-CM | POA: Diagnosis present

## 2020-06-21 DIAGNOSIS — F17210 Nicotine dependence, cigarettes, uncomplicated: Secondary | ICD-10-CM | POA: Diagnosis present

## 2020-06-21 DIAGNOSIS — J45909 Unspecified asthma, uncomplicated: Secondary | ICD-10-CM | POA: Insufficient documentation

## 2020-06-21 DIAGNOSIS — Z87820 Personal history of traumatic brain injury: Secondary | ICD-10-CM | POA: Diagnosis not present

## 2020-06-21 DIAGNOSIS — F312 Bipolar disorder, current episode manic severe with psychotic features: Principal | ICD-10-CM | POA: Diagnosis present

## 2020-06-21 DIAGNOSIS — Z79899 Other long term (current) drug therapy: Secondary | ICD-10-CM | POA: Diagnosis not present

## 2020-06-21 DIAGNOSIS — Z9114 Patient's other noncompliance with medication regimen: Secondary | ICD-10-CM

## 2020-06-21 DIAGNOSIS — G4700 Insomnia, unspecified: Secondary | ICD-10-CM | POA: Diagnosis present

## 2020-06-21 DIAGNOSIS — N760 Acute vaginitis: Secondary | ICD-10-CM

## 2020-06-21 DIAGNOSIS — N898 Other specified noninflammatory disorders of vagina: Secondary | ICD-10-CM | POA: Insufficient documentation

## 2020-06-21 DIAGNOSIS — Z20822 Contact with and (suspected) exposure to covid-19: Secondary | ICD-10-CM | POA: Diagnosis present

## 2020-06-21 DIAGNOSIS — F309 Manic episode, unspecified: Secondary | ICD-10-CM | POA: Diagnosis present

## 2020-06-21 LAB — SARS CORONAVIRUS 2 (TAT 6-24 HRS): SARS Coronavirus 2: NEGATIVE

## 2020-06-21 LAB — RESP PANEL BY RT-PCR (FLU A&B, COVID) ARPGX2
Influenza A by PCR: NEGATIVE
Influenza B by PCR: NEGATIVE
SARS Coronavirus 2 by RT PCR: NEGATIVE

## 2020-06-21 LAB — WET PREP, GENITAL
Clue Cells Wet Prep HPF POC: NONE SEEN
Sperm: NONE SEEN
Trich, Wet Prep: NONE SEEN
Yeast Wet Prep HPF POC: NONE SEEN

## 2020-06-21 MED ORDER — ZIPRASIDONE MESYLATE 20 MG IM SOLR: 20.0000 mg | INTRAMUSCULAR | Status: DC | PRN

## 2020-06-21 MED ORDER — OLANZAPINE 5 MG PO TBDP: 5.0000 mg | ORAL_TABLET | Freq: Three times a day (TID) | ORAL | Status: DC | PRN

## 2020-06-21 MED ORDER — MAGNESIUM HYDROXIDE 400 MG/5ML PO SUSP: 30.0000 mL | Freq: Every day | ORAL | Status: DC | PRN

## 2020-06-21 MED ORDER — METRONIDAZOLE 500 MG PO TABS
500.0000 mg | ORAL_TABLET | Freq: Two times a day (BID) | ORAL | 0 refills | Status: DC
Start: 2020-06-21 — End: 2020-07-01

## 2020-06-21 MED ORDER — TRAZODONE HCL 50 MG PO TABS
50.0000 mg | ORAL_TABLET | Freq: Every evening | ORAL | Status: DC | PRN
Start: 2020-06-21 — End: 2020-06-22
  Administered 2020-06-21: 50 mg via ORAL
  Filled 2020-06-21: qty 1

## 2020-06-21 MED ORDER — OLANZAPINE 5 MG PO TABS
5.0000 mg | ORAL_TABLET | Freq: Every day | ORAL | Status: DC
  Administered 2020-06-21 – 2020-06-25 (×5): 5 mg via ORAL
  Filled 2020-06-21 (×10): qty 1

## 2020-06-21 MED ORDER — HYDROXYZINE HCL 25 MG PO TABS
25.0000 mg | ORAL_TABLET | Freq: Three times a day (TID) | ORAL | Status: DC | PRN
  Administered 2020-06-21 – 2020-06-22 (×2): 25 mg via ORAL
  Filled 2020-06-21 (×2): qty 1

## 2020-06-21 MED ORDER — LORAZEPAM 1 MG PO TABS
1.0000 mg | ORAL_TABLET | ORAL | Status: AC | PRN
  Administered 2020-06-22: 1 mg via ORAL
  Filled 2020-06-21: qty 1

## 2020-06-21 NOTE — Discharge Instructions (Addendum)
Call your primary care doctor or specialist as discussed in the next 2-3 days.   Return immediately back to the ER if:  Your symptoms worsen within the next 12-24 hours. You develop new symptoms such as new fevers, persistent vomiting, new pain, shortness of breath, or new weakness or numbness, or if you have any other concerns.  

## 2020-06-21 NOTE — H&P (Signed)
Behavioral Health Medical Screening Exam  [name] is an 40 y.o. female who presented to [facility] as walk-in alone for assessment of manic symptoms.  Patient presents easily distractible, indiscretion, flight of ideas, increased activity, sleep deficit, and talkativeness; no grandiosity noted on assessment. Patient unable to state when she last ate and endorses only sleeping "a few hours" this week. Extensive thought blocking noted; not fully present during assessment. Patient denies any suicidal or homicidal thoughts; states "I don't know, sometimes" when asked about auditory and visual hallucinations. She denies any substance use. Patient states, "I just know I need help. I need my meds". Patient observed pacing hallway; easily redirected. Lunch tray provided and patient shown bathroom per request.  Based on patient history and presenting symptoms patient recommended for inpatient hospitalization for further observation, stabilization, and treatment. [facility] Danika RN notified and reviewing patient; accepted to [facility] pending negative COVID.   Total Time spent with patient: 20 minutes  Psychiatric Specialty Exam: Physical Exam Vitals and nursing note reviewed.  Psychiatric:        Attention and Perception: She is inattentive.        Mood and Affect: Affect is inappropriate.        Speech: Speech is tangential.        Behavior: Behavior is cooperative.        Thought Content: Thought content is paranoid and delusional. Thought content does not include homicidal or suicidal ideation.        Cognition and Memory: Cognition is impaired.        Judgment: Judgment is impulsive and inappropriate.    Review of Systems  Psychiatric/Behavioral: Positive for decreased concentration and sleep disturbance.  All other systems reviewed and are negative.  Blood pressure (!) 150/95, temperature 98.2 F (36.8 C), temperature source Oral, resp. rate 18, SpO2 99 %.There is no height or weight on file to  calculate BMI. General Appearance: Casual Eye Contact:  fleeting Speech:  Blocked and Pressured Volume:  Normal Mood:  Anxious Affect:  Non-Congruent Thought Process:  Disorganized Orientation:  NA Thought Content:  Paranoid Ideation and Tangential Suicidal Thoughts:  No Homicidal Thoughts:  No Memory:  Immediate;   Poor Recent;   Fair Judgement:  Impaired Insight:  Lacking Psychomotor Activity:  Increased Concentration: Concentration: Poor and Attention Span: Poor Recall:  Poor Fund of Knowledge:Fair Language: Fair Akathisia:  NA Handed:   AIMS (if indicated):    Assets:  Communication Skills Desire for Improvement Financial Resources/Insurance Physical Health Resilience Sleep:     Musculoskeletal: Strength & Muscle Tone: within normal limits Gait & Station: normal Patient leans: N/A  Blood pressure (!) 150/95, temperature 98.2 F (36.8 C), temperature source Oral, resp. rate 18, SpO2 99 %.  Recommendations: Based on my evaluation the patient does not appear to have an emergency medical condition. Patient recommended for inpatient psychiatric hospitalization. Accepted to [facility] pending negative COVID test  Loletta Parish, NP 06/21/2020, 3:56 PM

## 2020-06-21 NOTE — ED Notes (Signed)
Pt said she doesn't feel as if she accomplished anything during this visit. Said she feels as if her stomach enlarged overnight. Said she still feels ill and needs mental health treatment. I referred her to the [facility].
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
## 2020-06-21 NOTE — [facility] Group Notes (Signed)
Type of Therapy and Topic:  Group Therapy - Healthy vs Unhealthy Coping Skills  Participation Level:  Did not attend   Description of Group The focus of this group was to determine what unhealthy coping techniques typically are used by group members and what healthy coping techniques would be helpful in coping with various problems. Patients were guided in becoming aware of the differences between healthy and unhealthy coping techniques. Patients were asked to identify 2-3 healthy coping skills they would like to learn to use more effectively.  Therapeutic Goals 1. Patients learned that coping is what human beings do all day long to deal with various situations in their lives 2. Patients defined and discussed healthy vs unhealthy coping techniques 3. Patients identified their preferred coping techniques and identified whether these were healthy or unhealthy 4. Patients determined 2-3 healthy coping skills they would like to become more familiar with and use more often. 5. Patients provided support and ideas to each other   Summary of Patient Progress:  Did not attend

## 2020-06-21 NOTE — ED Triage Notes (Signed)
Patient c/o rash on her arms and a vaginal discharge. Patient states she does not know when either started. Patient very vague about condition.
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
## 2020-06-21 NOTE — Plan of Care (Signed)
  Problem: Safety: Goal: Periods of time without injury will increase Outcome: Progressing   

## 2020-06-21 NOTE — Progress Notes (Signed)
Pt visible on the unit some, pt endorsed VH, but pt stated she was ok    06/21/20 2000  Psych Admission Type (Psych Patients Only)  Admission Status Voluntary  Psychosocial Assessment  Patient Complaints Anxiety  Eye Contact Avoids  Facial Expression Sad  Affect Sad  Speech Soft  Interaction Avoidant  Motor Activity Slow  Appearance/Hygiene Disheveled  Behavior Characteristics Restless  Mood Depressed  Aggressive Behavior  Effect No apparent injury  Thought Process  Coherency WDL  Content WDL  Delusions WDL  Perception Hallucinations  Hallucination Visual  Judgment Impaired  Confusion WDL  Danger to Self  Current suicidal ideation? Denies  Danger to Others  Danger to Others None reported or observed

## 2020-06-22 DIAGNOSIS — Z87820 Personal history of traumatic brain injury: Secondary | ICD-10-CM | POA: Insufficient documentation

## 2020-06-22 DIAGNOSIS — F312 Bipolar disorder, current episode manic severe with psychotic features: Principal | ICD-10-CM

## 2020-06-22 LAB — COMPREHENSIVE METABOLIC PANEL
ALT: 75 U/L — ABNORMAL HIGH (ref 0–44)
AST: 44 U/L — ABNORMAL HIGH (ref 15–41)
Albumin: 4.4 g/dL (ref 3.5–5.0)
Alkaline Phosphatase: 111 U/L (ref 38–126)
Anion gap: 7 (ref 5–15)
BUN: <5 mg/dL — ABNORMAL LOW (ref 6–20)
CO2: 23 mmol/L (ref 22–32)
Calcium: 9 mg/dL (ref 8.9–10.3)
Chloride: 105 mmol/L (ref 98–111)
Creatinine, Ser: 0.67 mg/dL (ref 0.44–1.00)
GFR, Estimated: >60 mL/min (ref >60)
Glucose, Bld: 122 mg/dL — ABNORMAL HIGH (ref 70–99)
Potassium: 3.5 mmol/L (ref 3.5–5.1)
Sodium: 135 mmol/L (ref 135–145)
Total Bilirubin: 0.4 mg/dL (ref 0.3–1.2)
Total Protein: 7.1 g/dL (ref 6.5–8.1)

## 2020-06-22 LAB — URINALYSIS, ROUTINE W REFLEX MICROSCOPIC
Bilirubin Urine: NEGATIVE
Glucose, UA: NEGATIVE mg/dL
Hgb urine dipstick: NEGATIVE
Ketones, ur: NEGATIVE mg/dL
Leukocytes,Ua: NEGATIVE
Nitrite: NEGATIVE
Protein, ur: NEGATIVE mg/dL
Specific Gravity, Urine: 1.002 — ABNORMAL LOW (ref 1.005–1.030)
pH: 7 (ref 5.0–8.0)

## 2020-06-22 LAB — LIPID PANEL
Cholesterol: 148 mg/dL (ref 0–200)
HDL: 46 mg/dL (ref >40)
LDL Cholesterol: 80 mg/dL (ref 0–99)
Total CHOL/HDL Ratio: 3.2 RATIO
Triglycerides: 111 mg/dL (ref <150)
VLDL: 22 mg/dL (ref 0–40)

## 2020-06-22 LAB — CBC
HCT: 42.1 % (ref 36.0–46.0)
Hemoglobin: 13.2 g/dL (ref 12.0–15.0)
MCH: 29.9 pg (ref 26.0–34.0)
MCHC: 31.4 g/dL (ref 30.0–36.0)
MCV: 95.5 fL (ref 80.0–100.0)
Platelets: 297 10*3/uL (ref 150–400)
RBC: 4.41 MIL/uL (ref 3.87–5.11)
RDW: 13.7 % (ref 11.5–15.5)
WBC: 11 10*3/uL — ABNORMAL HIGH (ref 4.0–10.5)
nRBC: 0 % (ref 0.0–0.2)

## 2020-06-22 LAB — HEPATIC FUNCTION PANEL
ALT: 75 U/L — ABNORMAL HIGH (ref 0–44)
AST: 44 U/L — ABNORMAL HIGH (ref 15–41)
Albumin: 4.5 g/dL (ref 3.5–5.0)
Alkaline Phosphatase: 112 U/L (ref 38–126)
Bilirubin, Direct: 0.1 mg/dL (ref 0.0–0.2)
Indirect Bilirubin: 0.2 mg/dL — ABNORMAL LOW (ref 0.3–0.9)
Total Bilirubin: 0.3 mg/dL (ref 0.3–1.2)
Total Protein: 7.2 g/dL (ref 6.5–8.1)

## 2020-06-22 LAB — PREGNANCY, URINE: Preg Test, Ur: NEGATIVE

## 2020-06-22 LAB — RAPID URINE DRUG SCREEN, HOSP PERFORMED
Amphetamines: NOT DETECTED
Barbiturates: NOT DETECTED
Benzodiazepines: NOT DETECTED
Cocaine: NOT DETECTED
Opiates: NOT DETECTED
Tetrahydrocannabinol: NOT DETECTED

## 2020-06-22 LAB — ETHANOL: Alcohol, Ethyl (B): <10 mg/dL (ref <10)

## 2020-06-22 LAB — GLUCOSE, CAPILLARY
Glucose-Capillary: 127 mg/dL — ABNORMAL HIGH (ref 70–99)
Glucose-Capillary: 115 mg/dL — ABNORMAL HIGH (ref 70–99)
Glucose-Capillary: 96 mg/dL (ref 70–99)

## 2020-06-22 LAB — TSH: TSH: 4.263 u[IU]/mL (ref 0.350–4.500)

## 2020-06-22 MED ORDER — TRAZODONE HCL 100 MG PO TABS
100.0000 mg | ORAL_TABLET | Freq: Every day | ORAL | Status: DC
  Administered 2020-06-22 – 2020-06-23 (×2): 100 mg via ORAL
  Filled 2020-06-22 (×3): qty 1

## 2020-06-22 MED ORDER — ONDANSETRON 4 MG PO TBDP: 4.0000 mg | ORAL_TABLET | Freq: Four times a day (QID) | ORAL | Status: DC | PRN

## 2020-06-22 MED ORDER — LOPERAMIDE HCL 2 MG PO CAPS: 2.0000 mg | ORAL_CAPSULE | ORAL | Status: DC | PRN

## 2020-06-22 MED ORDER — LORAZEPAM 1 MG PO TABS
1.0000 mg | ORAL_TABLET | Freq: Four times a day (QID) | ORAL | Status: DC | PRN
  Filled 2020-06-22: qty 1

## 2020-06-22 MED ORDER — LORAZEPAM 1 MG PO TABS: 1.0000 mg | ORAL_TABLET | Freq: Four times a day (QID) | ORAL | Status: DC | PRN

## 2020-06-22 MED ORDER — NICOTINE POLACRILEX 2 MG MT GUM
2.0000 mg | CHEWING_GUM | OROMUCOSAL | Status: DC | PRN
  Administered 2020-06-22 – 2020-06-30 (×17): 2 mg via ORAL
  Filled 2020-06-22 (×9): qty 1

## 2020-06-22 MED ORDER — THIAMINE HCL 100 MG PO TABS
100.0000 mg | ORAL_TABLET | Freq: Every day | ORAL | Status: DC
  Administered 2020-06-23 – 2020-06-24 (×2): 100 mg via ORAL
  Filled 2020-06-22 (×3): qty 1

## 2020-06-22 MED ORDER — OLANZAPINE 10 MG PO TBDP
10.0000 mg | ORAL_TABLET | Freq: Every day | ORAL | Status: DC
Start: 2020-06-22 — End: 2020-06-23
  Administered 2020-06-22: 10 mg via ORAL
  Filled 2020-06-22 (×3): qty 1

## 2020-06-22 MED ORDER — TRAZODONE HCL 50 MG PO TABS
50.0000 mg | ORAL_TABLET | Freq: Once | ORAL | Status: AC
  Administered 2020-06-22: 50 mg via ORAL
  Filled 2020-06-22 (×2): qty 1

## 2020-06-22 MED ORDER — HYDROXYZINE HCL 25 MG PO TABS
25.0000 mg | ORAL_TABLET | Freq: Four times a day (QID) | ORAL | Status: DC | PRN
  Administered 2020-06-22: 25 mg via ORAL
  Filled 2020-06-22: qty 1

## 2020-06-22 MED ORDER — ADULT MULTIVITAMIN W/MINERALS CH
1.0000 | ORAL_TABLET | Freq: Every day | ORAL | Status: DC
  Administered 2020-06-22 – 2020-07-01 (×10): 1 via ORAL
  Filled 2020-06-22 (×13): qty 1

## 2020-06-22 NOTE — Progress Notes (Signed)
   06/22/20 0500  Sleep  Number of Hours 0.5
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
## 2020-06-22 NOTE — Progress Notes (Signed)
Pt up pacing the unit, pt restless not able to lay down and go to sleep. Pt given PRN Ativan and 1 x Trazodone per [facility]
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
## 2020-06-22 NOTE — Progress Notes (Addendum)
Pt continues to be guarded, paranoid and anxious on interactions. Observed pacing hall at intervals and on hall phone a lot with family, speech noted to be pressured then with repetitive theme about her clothing or asking for scrubs from staff on every encounter. Denies SI, HI, VH and pain when assessed. Endorsed +AH that are not commanding in nature "I do hear voices but they are not telling me to do anything. They are just there". Denies active withdrawal symptoms when assessed, however, pt remains anxious and reports "voices are loud to me sometimes" when assessed for her noon CIWA. Pt rates her anxiety 7/10 and depression 9/10. She received PRN  At 0755 as ordered for anxiety and reported decreased anxiety 2/10 when reassessed at 0850. Pt is cooperative with CBG monitoring on approach. Emotional support and encouragement offered to pt. Safety checks maintained at Q 15 minutes intervals without self harm gestures or outburst to note thus far. All medications given with verbal education and effects monitored.  Safety maintained on and off unit without disruptive behavior. Pt tolerates all meals, fluids and medications well without discomfort.
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
## 2020-06-22 NOTE — Progress Notes (Signed)
Patient rated her day as a 5 out of 10 since she feels "more stable". She also went outside for fresh air as well.

## 2020-06-22 NOTE — Progress Notes (Signed)
   06/22/20 2045  Psych Admission Type (Psych Patients Only)  Admission Status Voluntary  Psychosocial Assessment  Patient Complaints Anxiety;Depression  Eye Contact Fair  Facial Expression Anxious;Sad;Worried  Affect Appropriate to circumstance  Speech Logical/coherent  Interaction Assertive  Motor Activity Slow  Appearance/Hygiene Unremarkable  Behavior Characteristics Cooperative;Anxious  Mood Anxious;Depressed  Aggressive Behavior  Effect No apparent injury  Thought Process  Coherency Blocking  Content WDL  Delusions None reported or observed  Perception Hallucinations  Hallucination None reported or observed  Judgment Impaired  Confusion None  Danger to Self  Current suicidal ideation? Denies  Danger to Others  Danger to Others None reported or observed   Pt denies SI, HI, AVH and pain. Rates anxiety 5/10 but says it was lower earlier. Pt rates depression 3/10. Says she believes that her doctor and mother are disappointed in her because she stopped taking her meds. Says she stopped because she was getting better. Pt worried that the doctor can take some action against her because she stopped taking her medication. Assured pt that this is not the case. Any injury is to her personally and mentally d/t lack of treatment.
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
## 2020-06-23 DIAGNOSIS — F312 Bipolar disorder, current episode manic severe with psychotic features: Secondary | ICD-10-CM | POA: Diagnosis not present

## 2020-06-23 LAB — COMPREHENSIVE METABOLIC PANEL
ALT: 68 U/L — ABNORMAL HIGH (ref 0–44)
AST: 40 U/L (ref 15–41)
Albumin: 4.3 g/dL (ref 3.5–5.0)
Alkaline Phosphatase: 108 U/L (ref 38–126)
Anion gap: 8 (ref 5–15)
BUN: 7 mg/dL (ref 6–20)
CO2: 24 mmol/L (ref 22–32)
Calcium: 9.1 mg/dL (ref 8.9–10.3)
Chloride: 104 mmol/L (ref 98–111)
Creatinine, Ser: 0.75 mg/dL (ref 0.44–1.00)
GFR, Estimated: >60 mL/min (ref >60)
Glucose, Bld: 131 mg/dL — ABNORMAL HIGH (ref 70–99)
Potassium: 3.8 mmol/L (ref 3.5–5.1)
Sodium: 136 mmol/L (ref 135–145)
Total Bilirubin: 0.4 mg/dL (ref 0.3–1.2)
Total Protein: 7.1 g/dL (ref 6.5–8.1)

## 2020-06-23 LAB — GLUCOSE, CAPILLARY
Glucose-Capillary: 115 mg/dL — ABNORMAL HIGH (ref 70–99)
Glucose-Capillary: 91 mg/dL (ref 70–99)

## 2020-06-23 LAB — T4, FREE: Free T4: 1.17 ng/dL — ABNORMAL HIGH (ref 0.61–1.12)

## 2020-06-23 MED ORDER — HYDROXYZINE HCL 25 MG PO TABS
25.0000 mg | ORAL_TABLET | Freq: Three times a day (TID) | ORAL | Status: DC | PRN
  Filled 2020-06-23: qty 1

## 2020-06-23 MED ORDER — OLANZAPINE 5 MG PO TBDP
15.0000 mg | ORAL_TABLET | Freq: Every day | ORAL | Status: DC
  Administered 2020-06-23 – 2020-06-24 (×2): 15 mg via ORAL
  Filled 2020-06-23 (×4): qty 1

## 2020-06-23 NOTE — Progress Notes (Signed)
Pt reported that she slept well last night and that she feels "pretty good" today.  Pt denied SI/HI/AVH.  Pt attending groups and going outside with peers.  Pt has been calm and appropriate this shift.  RN provided support and assessed for needs and concerns.  Pt is safe on the unit with q 15 min checks in place.

## 2020-06-23 NOTE — Progress Notes (Signed)
The focus of this group is to help patients review their daily goal of treatment and discuss progress on daily workbooks. Pt did not attend the evening group. 

## 2020-06-23 NOTE — Progress Notes (Signed)
   06/23/20 0605  Vital Signs  Temp 97.6 F (36.4 C)  Temp Source Oral  Pulse Rate 95  Pulse Rate Source Monitor  BP (!) 88/60  BP Location Left Arm  BP Method Automatic  Patient Position (if appropriate) Sitting  Oxygen Therapy  SpO2 98 %   Pt given 12 oz Gatorade d/t some lightheadedness.

## 2020-06-23 NOTE — [facility] Group Notes (Signed)
Group Notes  Patient was extremely standoffish and reserved. Patient did not want to share any information.   Patient rated anxiety and depression 7/10 and said it was due to not having life together.

## 2020-06-23 NOTE — [facility] Group Notes (Signed)
LCSW Group Therapy Note  06/23/2020       Type of Therapy and Topic:  Group Therapy: "My Mental Health"  Participation Level:  Active   Description of Group:   In this group, patients were asked four questions in order to generate discussion around the idea of mental illness and/or substance addiction being medical problems: In one sentence describe the current state of your mental health or substance use. How much do you feel similar to or different from others? Do you tend to identify with other people or compare yourself to them?  In a word or sentence, share what you desire your mental health or substance use to be moving forward.  Discussion was held that led to the conclusion that comparing ourselves to others is not healthy, but identifying with the elements of their issues that are similar to ours is helpful.    Therapeutic Goals: Patients will identify their feelings about their current mental health/substance use problems. Patients will describe how they feel similar to or different from others, and whether they tend to identify with or compare themselves to other people with the same issues. Patients will explore the differences in these concepts and how a change of mindset about mental health/substance use can help with reaching recovery goals. Patients will think about and share what their recovery goals are, in terms of mental health and/or substance use.  Summary of Patient Progress:  The patient shared that she feels the current status of her mental health to be "manic."  She said she has a lot of highs and lows, and her desire is to have stability in her mood.  Therapeutic Modalities:   Processing Motivational Interviewing Psychoeducation  Lynnell Chad, MSW, LCSW

## 2020-06-23 NOTE — Progress Notes (Signed)
[facility] MD Progress Note  06/23/2020 3:26 PM [name]  MRN:  [id number]  Subjective:  [name] reports "I feel a little better today. I slept good and I am taking my medications."   Objective: [name] is a 40 year old female who presented to [facility], voluntarily, as a walk-in for help with mania. This is the 6th [facility] admission for Bipolar 1 disorder with psychosis for this patient. Previous admissions were on 9/19, 10/19, 4/20, 10/20, and 6/21.  Patient was seen and evaluated, chart reviewed and case discussed with the treatment team. [name] was seen in her room after lunch, she was lying down and stated she was a little  dizzy. Her BP was low early this morning 88/60, HR 95 at 0600, on retake at 0800 her BP was 126/73, HR 88. Reminded her to drink plenty of fluids.  Patient stated she feels better today and slept well last night. She slept 5.75 hours last night. She reported a good appetite. Patient appears less psychotic today. Her thought process is coherent but still tangential at times. She stated she still is having some paranoia about people following her but rationally knows they are not. She stated she feels safe in the hospital and is able to contract for safety. Patient is taking her medications and stated they seem to be working. She told staff yesterday that she did not want medication management appointments after discharge but will go to therapy. Today, she is saying she probably needs to go to medication management because she has not been doing well with this on her own. She stated the long acting medication she was on before is too expensive and she has no insurance and is not on disability. She is okay with staying on Zyprexa for now. She does well holding the conversation together but near the end starts asking about an MRI and believes that she had a heat stroke several days ago that has damaged her brain. Reminded patient that she has had several concussions and a traumatic  brain  injury in the past. We discussed letting the medications get into her system and see how she is doing. She is in agreement with this plan.  Will continue to monitor for safety. Encouragement and support provided.   Labs reviewed today: Repeat CMP with improved ALT at 68 and AST 40. T4 is 1.17. Glucose 131. CBG's today are 115 and 91. Elevated glucose possibly from some dehydration. Will stop CBG's today. No new labs today.   Principal Problem: Bipolar I disorder, current or most recent episode manic, with psychotic features (HCC) Diagnosis: Principal Problem:   Bipolar I disorder, current or most recent episode manic, with psychotic features (HCC)  Total Time spent with patient: 25 minutes  Past Psychiatric History: See H&P  Past Medical History:  Past Medical History:  Diagnosis Date  . ADD 10/22/2009  . ALLERGIC RHINITIS 09/26/2008  . Anemia   . ANXIETY 09/26/2008  . Asthma   . Complication of anesthesia    "tempermental" per patient, "aggiated and hostile"  . CONSTIPATION 09/26/2008  . DEPRESSION 09/26/2008  . FATIGUE 01/14/2009  . Head injury with loss of consciousness (HCC)   . Headache   . HYPERLIPIDEMIA 09/26/2008  . IBS (irritable bowel syndrome)   . OTITIS MEDIA, LEFT 01/14/2009  . SKIN LESION 09/26/2008  . Sleep apnea   . TONSILLITIS, ACUTE 01/14/2009  . Unconscious state (HCC) 10/13/2017  . URI 10/22/2009    Past Surgical History:  Procedure Laterality  Date  . CERVICAL POLYPECTOMY  2009  . CERVICAL POLYPECTOMY  2019  . DILATATION & CURETTAGE/HYSTEROSCOPY WITH MYOSURE N/A 11/17/2017   Procedure: DILATATION & CURETTAGE/HYSTEROSCOPY WITH MYOSURE;  Surgeon: Geryl RankinsVarnado, Evelyn, MD;  Location: [facility];  Service: Gynecology;  Laterality: N/A;  . ORIF ANKLE FRACTURE Right 08/24/2018   Procedure: OPEN REDUCTION INTERNAL FIXATION TRIMALLEOLAR ANKLE FRACTURE;  Surgeon: Eldred MangesYates, Mark C, MD;  Location: [facility] OR;  Service: Orthopedics;  Laterality: Right;   Family History:  Family History   Problem Relation Age of Onset  . Diabetes Other   . Hypertension Other   . Thyroid disease Mother   . Hypertension Mother    Family Psychiatric  History: See H&P Social History:  Social History   Substance and Sexual Activity  Alcohol Use Not Currently     Social History   Substance and Sexual Activity  Drug Use No    Social History   Socioeconomic History  . Marital status: Single    Spouse name: Not on file  . Number of children: Not on file  . Years of education: Not on file  . Highest education level: Not on file  Occupational History  . Occupation: full time waitress  Tobacco Use  . Smoking status: Current Every Day Smoker    Packs/day: 1.00    Years: 20.00    Pack years: 20.00    Types: Cigarettes  . Smokeless tobacco: Never Used  Vaping Use  . Vaping Use: Former  Substance and Sexual Activity  . Alcohol use: Not Currently  . Drug use: No  . Sexual activity: Not Currently    Birth control/protection: Abstinence  Other Topics Concern  . Not on file  Social History Narrative   Lives with mother   Social Determinants of Health   Financial Resource Strain: Not on file  Food Insecurity: Not on file  Transportation Needs: Not on file  Physical Activity: Not on file  Stress: Not on file  Social Connections: Not on file   Additional Social History:    Pain Medications: See MAR Prescriptions: See MAR Over the Counter: See MAR History of alcohol / drug use?: No history of alcohol / drug abuse Longest period of sobriety (when/how long): UTA Negative Consequences of Use: Personal relationships    Sleep: Fair  Appetite:  Good  Current Medications: Current Facility-Administered Medications  Medication Dose Route Frequency Provider Last Rate Last Admin  . hydrOXYzine (ATARAX/VISTARIL) tablet 25 mg  25 mg Oral TID PRN Laveda AbbeParks, Amilee Janvier Britton, NP      . loperamide (IMODIUM) capsule 2-4 mg  2-4 mg Oral PRN Laveda AbbeParks, Merryl Buckels Britton, NP      . magnesium  hydroxide (MILK OF MAGNESIA) suspension 30 mL  30 mL Oral Daily PRN Leevy-Johnson, Brooke A, NP      . multivitamin with minerals tablet 1 tablet  1 tablet Oral Daily Laveda AbbeParks, Mansur Patti Britton, NP   1 tablet at 06/23/20 0807  . nicotine polacrilex (NICORETTE) gum 2 mg  2 mg Oral PRN Dahlia Byesnuoha, Josephine C, NP   2 mg at 06/23/20 1506  . OLANZapine (ZYPREXA) tablet 5 mg  5 mg Oral Daily Leevy-Johnson, Brooke A, NP   5 mg at 06/23/20 0807  . OLANZapine zydis (ZYPREXA) disintegrating tablet 10 mg  10 mg Oral QHS Laveda AbbeParks, Zubair Lofton Britton, NP   10 mg at 06/22/20 2109  . OLANZapine zydis (ZYPREXA) disintegrating tablet 5 mg  5 mg Oral Q8H PRN Leevy-Johnson, Lina SarBrooke A, NP  And  . ziprasidone (GEODON) injection 20 mg  20 mg Intramuscular PRN Leevy-Johnson, Brooke A, NP      . ondansetron (ZOFRAN-ODT) disintegrating tablet 4 mg  4 mg Oral Q6H PRN Laveda Abbe, NP      . thiamine tablet 100 mg  100 mg Oral Daily Laveda Abbe, NP   100 mg at 06/23/20 0240  . traZODone (DESYREL) tablet 100 mg  100 mg Oral QHS Laveda Abbe, NP   100 mg at 06/22/20 2109    Lab Results:  Results for orders placed or performed during the hospital encounter of 06/21/20 (from the past 48 hour(s))  Resp Panel by RT-PCR (Flu A&B, Covid) Nasopharyngeal Swab     Status: None   Collection Time: 06/21/20  3:39 PM   Specimen: Nasopharyngeal Swab; Nasopharyngeal(NP) swabs in vial transport medium  Result Value Ref Range   SARS Coronavirus 2 by RT PCR NEGATIVE NEGATIVE    Comment: (NOTE) SARS-CoV-2 target nucleic acids are NOT DETECTED.  The SARS-CoV-2 RNA is generally detectable in upper respiratory specimens during the acute phase of infection. The lowest concentration of SARS-CoV-2 viral copies this assay can detect is 138 copies/mL. A negative result does not preclude SARS-Cov-2 infection and should not be used as the sole basis for treatment or other patient management decisions. A negative result may occur  with  improper specimen collection/handling, submission of specimen other than nasopharyngeal swab, presence of viral mutation(s) within the areas targeted by this assay, and inadequate number of viral copies(<138 copies/mL). A negative result must be combined with clinical observations, patient history, and epidemiological information. The expected result is Negative.  Fact Sheet for Patients:  BloggerCourse.com  Fact Sheet for Healthcare Providers:  SeriousBroker.it  This test is no t yet approved or cleared by the [place] FDA and  has been authorized for detection and/or diagnosis of SARS-CoV-2 by FDA under an Emergency Use Authorization (EUA). This EUA will remain  in effect (meaning this test can be used) for the duration of the COVID-19 declaration under Section 564(b)(1) of the Act, 21 U.S.C.section 360bbb-3(b)(1), unless the authorization is terminated  or revoked sooner.       Influenza A by PCR NEGATIVE NEGATIVE   Influenza B by PCR NEGATIVE NEGATIVE    Comment: (NOTE) The Xpert Xpress SARS-CoV-2/FLU/RSV plus assay is intended as an aid in the diagnosis of influenza from Nasopharyngeal swab specimens and should not be used as a sole basis for treatment. Nasal washings and aspirates are unacceptable for Xpert Xpress SARS-CoV-2/FLU/RSV testing.  Fact Sheet for Patients: BloggerCourse.com  Fact Sheet for Healthcare Providers: SeriousBroker.it  This test is not yet approved or cleared by the [place] FDA and has been authorized for detection and/or diagnosis of SARS-CoV-2 by FDA under an Emergency Use Authorization (EUA). This EUA will remain in effect (meaning this test can be used) for the duration of the COVID-19 declaration under Section 564(b)(1) of the Act, 21 U.S.C. section 360bbb-3(b)(1), unless the authorization is terminated or revoked.  Performed  at [facility], [address], [place], [place] [address]   CBC     Status: Abnormal   Collection Time: 06/22/20  6:45 AM  Result Value Ref Range   WBC 11.0 (H) 4.0 - 10.5 K/uL   RBC 4.41 3.87 - 5.11 MIL/uL   Hemoglobin 13.2 12.0 - 15.0 g/dL   HCT 29.9 24.2 - 68.3 %   MCV 95.5 80.0 - 100.0 fL   MCH 29.9  26.0 - 34.0 pg   MCHC 31.4 30.0 - 36.0 g/dL   RDW 16.1 09.6 - 04.5 %   Platelets 297 150 - 400 K/uL   nRBC 0.0 0.0 - 0.2 %    Comment: Performed at [facility], [address] [place], [place] [address]  Comprehensive metabolic panel     Status: Abnormal   Collection Time: 06/22/20  6:45 AM  Result Value Ref Range   Sodium 135 135 - 145 mmol/L   Potassium 3.5 3.5 - 5.1 mmol/L   Chloride 105 98 - 111 mmol/L   CO2 23 22 - 32 mmol/L   Glucose, Bld 122 (H) 70 - 99 mg/dL    Comment: Glucose reference range applies only to samples taken after fasting for at least 8 hours.   BUN <5 (L) 6 - 20 mg/dL   Creatinine, Ser 1.91 0.44 - 1.00 mg/dL   Calcium 9.0 8.9 - 47.8 mg/dL   Total Protein 7.1 6.5 - 8.1 g/dL   Albumin 4.4 3.5 - 5.0 g/dL   AST 44 (H) 15 - 41 U/L   ALT 75 (H) 0 - 44 U/L   Alkaline Phosphatase 111 38 - 126 U/L   Total Bilirubin 0.4 0.3 - 1.2 mg/dL   GFR, Estimated >29 >56 mL/min    Comment: (NOTE) Calculated using the CKD-EPI Creatinine Equation (2021)    Anion gap 7 5 - 15    Comment: Performed at [facility], [address] [place], [place] [address]  TSH     Status: None   Collection Time: 06/22/20  6:45 AM  Result Value Ref Range   TSH 4.263 0.350 - 4.500 uIU/mL    Comment: Performed by a 3rd Generation assay with a functional sensitivity of <=0.01 uIU/mL. Performed at [facility], [address], [place], [place] [address]   Hepatic function panel     Status: Abnormal   Collection Time: 06/22/20  6:45 AM  Result Value Ref Range   Total Protein 7.2 6.5 - 8.1 g/dL   Albumin 4.5  3.5 - 5.0 g/dL   AST 44 (H) 15 - 41 U/L   ALT 75 (H) 0 - 44 U/L   Alkaline Phosphatase 112 38 - 126 U/L   Total Bilirubin 0.3 0.3 - 1.2 mg/dL   Bilirubin, Direct 0.1 0.0 - 0.2 mg/dL   Indirect Bilirubin 0.2 (L) 0.3 - 0.9 mg/dL    Comment: Performed at [facility], [address] [place], [place] [address]  Lipid panel     Status: None   Collection Time: 06/22/20  6:45 AM  Result Value Ref Range   Cholesterol 148 0 - 200 mg/dL   Triglycerides 528 <413 mg/dL   HDL 46 >24 mg/dL   Total CHOL/HDL Ratio 3.2 RATIO   VLDL 22 0 - 40 mg/dL   LDL Cholesterol 80 0 - 99 mg/dL    Comment:        Total Cholesterol/HDL:CHD Risk Coronary Heart Disease Risk Table                     Men   Women  1/2 Average Risk   3.4   3.3  Average Risk       5.0   4.4  2 X Average Risk   9.6   7.1  3 X Average Risk  23.4   11.0        Use the calculated Patient Ratio above  and the CHD Risk Table to determine the patient's CHD Risk.        ATP III CLASSIFICATION (LDL):  <100     mg/dL   Optimal  147-829  mg/dL   Near or Above                    Optimal  130-159  mg/dL   Borderline  562-130  mg/dL   High  >865     mg/dL   Very High Performed at [facility], [address], [place], [place] [address]   Pregnancy, urine     Status: None   Collection Time: 06/22/20  8:21 AM  Result Value Ref Range   Preg Test, Ur NEGATIVE NEGATIVE    Comment: Performed at [facility], [address], [place], [place] [address]  Urinalysis, Routine w reflex microscopic Urine, Random     Status: Abnormal   Collection Time: 06/22/20 10:56 AM  Result Value Ref Range   Color, Urine STRAW (A) YELLOW   APPearance CLEAR CLEAR   Specific Gravity, Urine 1.002 (L) 1.005 - 1.030   pH 7.0 5.0 - 8.0   Glucose, UA NEGATIVE NEGATIVE mg/dL   Hgb urine dipstick NEGATIVE NEGATIVE   Bilirubin Urine NEGATIVE NEGATIVE   Ketones, ur NEGATIVE NEGATIVE mg/dL   Protein, ur NEGATIVE  NEGATIVE mg/dL   Nitrite NEGATIVE NEGATIVE   Leukocytes,Ua NEGATIVE NEGATIVE    Comment: Performed at [facility], [address], [place], [place] [address]  Rapid urine drug screen (hospital performed)     Status: None   Collection Time: 06/22/20 10:56 AM  Result Value Ref Range   Opiates NONE DETECTED NONE DETECTED   Cocaine NONE DETECTED NONE DETECTED   Benzodiazepines NONE DETECTED NONE DETECTED   Amphetamines NONE DETECTED NONE DETECTED   Tetrahydrocannabinol NONE DETECTED NONE DETECTED   Barbiturates NONE DETECTED NONE DETECTED    Comment: (NOTE) DRUG SCREEN FOR MEDICAL PURPOSES ONLY.  IF CONFIRMATION IS NEEDED FOR ANY PURPOSE, NOTIFY LAB WITHIN 5 DAYS.  LOWEST DETECTABLE LIMITS FOR URINE DRUG SCREEN Drug Class                     Cutoff (ng/mL) Amphetamine and metabolites    1000 Barbiturate and metabolites    200 Benzodiazepine                 200 Tricyclics and metabolites     300 Opiates and metabolites        300 Cocaine and metabolites        300 THC                            50 Performed at [facility], [address], [place], [place] [address]   Glucose, capillary     Status: Abnormal   Collection Time: 06/22/20 12:17 PM  Result Value Ref Range   Glucose-Capillary 127 (H) 70 - 99 mg/dL    Comment: Glucose reference range applies only to samples taken after fasting for at least 8 hours.  Glucose, capillary     Status: Abnormal   Collection Time: 06/22/20  5:23 PM  Result Value Ref Range   Glucose-Capillary 115 (H) 70 - 99 mg/dL    Comment: Glucose reference range applies only to samples taken after fasting for at least 8 hours.  T4, free     Status: Abnormal  Collection Time: 06/22/20  5:30 PM  Result Value Ref Range   Free T4 1.17 (H) 0.61 - 1.12 ng/dL    Comment: (NOTE) Biotin ingestion may interfere with free T4 tests. If the results are inconsistent with the TSH level, previous test results, or the clinical  presentation, then consider biotin interference. If needed, order repeat testing after stopping biotin. Performed at [facility], [address], [place], [place] [address]   Ethanol     Status: None   Collection Time: 06/22/20  5:30 PM  Result Value Ref Range   Alcohol, Ethyl (B) <10 <10 mg/dL    Comment: (NOTE) Lowest detectable limit for serum alcohol is 10 mg/dL.  For medical purposes only. Performed at [facility], [address] [place], [place] [address]   Glucose, capillary     Status: None   Collection Time: 06/22/20  8:37 PM  Result Value Ref Range   Glucose-Capillary 96 70 - 99 mg/dL    Comment: Glucose reference range applies only to samples taken after fasting for at least 8 hours.  Glucose, capillary     Status: Abnormal   Collection Time: 06/23/20  5:55 AM  Result Value Ref Range   Glucose-Capillary 115 (H) 70 - 99 mg/dL    Comment: Glucose reference range applies only to samples taken after fasting for at least 8 hours.  Comprehensive metabolic panel     Status: Abnormal   Collection Time: 06/23/20  6:19 AM  Result Value Ref Range   Sodium 136 135 - 145 mmol/L   Potassium 3.8 3.5 - 5.1 mmol/L   Chloride 104 98 - 111 mmol/L   CO2 24 22 - 32 mmol/L   Glucose, Bld 131 (H) 70 - 99 mg/dL    Comment: Glucose reference range applies only to samples taken after fasting for at least 8 hours.   BUN 7 6 - 20 mg/dL   Creatinine, Ser 0.98 0.44 - 1.00 mg/dL   Calcium 9.1 8.9 - 11.9 mg/dL   Total Protein 7.1 6.5 - 8.1 g/dL   Albumin 4.3 3.5 - 5.0 g/dL   AST 40 15 - 41 U/L   ALT 68 (H) 0 - 44 U/L   Alkaline Phosphatase 108 38 - 126 U/L   Total Bilirubin 0.4 0.3 - 1.2 mg/dL   GFR, Estimated >14 >78 mL/min    Comment: (NOTE) Calculated using the CKD-EPI Creatinine Equation (2021)    Anion gap 8 5 - 15    Comment: Performed at [facility], [address], [place], [place] [address]  Glucose, capillary     Status: None    Collection Time: 06/23/20 12:08 PM  Result Value Ref Range   Glucose-Capillary 91 70 - 99 mg/dL    Comment: Glucose reference range applies only to samples taken after fasting for at least 8 hours.    Blood Alcohol level:  Lab Results  Component Value Date   ETH <10 06/22/2020   ETH <10 07/23/2019    Metabolic Disorder Labs: Lab Results  Component Value Date   HGBA1C 5.4 07/25/2019   MPG 108 07/25/2019   MPG 99.67 05/26/2018   No results found for: PROLACTIN Lab Results  Component Value Date   CHOL 148 06/22/2020   TRIG 111 06/22/2020   HDL 46 06/22/2020   CHOLHDL 3.2 06/22/2020   VLDL 22 06/22/2020   LDLCALC 80 06/22/2020   LDLCALC 80 07/25/2019    Physical Findings: AIMS:  , ,  ,  ,  CIWA:  CIWA-Ar Total: 6 COWS:     Musculoskeletal: Strength & Muscle Tone: within normal limits Gait & Station: normal Patient leans: N/A  Psychiatric Specialty Exam:  Presentation  General Appearance: Appropriate for Environment; Fairly Groomed; Casual  Eye Contact:Good  Speech:Clear and Coherent; Normal Rate  Speech Volume:Normal  Handedness:Right  Mood and Affect  Mood:Anxious; Depressed  Affect:Constricted (guarded, suspicious)  Thought Process  Thought Processes:Coherent  Descriptions of Associations:Tangential  Orientation:Full (Time, Place and Person)  Thought Content:Tangential; Paranoid Ideation; Logical  History of Schizophrenia/Schizoaffective disorder:No  Duration of Psychotic Symptoms:Greater than six months  Hallucinations:Hallucinations: None  Ideas of Reference:Paranoia  Suicidal Thoughts:Suicidal Thoughts: No  Homicidal Thoughts:Homicidal Thoughts: No  Sensorium  Memory:Immediate Fair; Recent Fair; Remote Fair  Judgment:Fair  Insight:Fair  Executive Functions  Concentration:Fair  Attention Span:Fair  Recall:Fair  Fund of Knowledge:Good  Language:Good  Psychomotor Activity  Psychomotor Activity:Psychomotor Activity:  Normal  Assets  Assets:Communication Skills; Resilience; Physical Health; Leisure Time; Desire for Improvement; Housing; Social Support  Sleep  Sleep:Sleep: Fair Number of Hours of Sleep: 5.75  Physical Exam: Physical Exam Vitals and nursing note reviewed.  Constitutional:      Appearance: Normal appearance.  HENT:     Head: Normocephalic.  Pulmonary:     Effort: Pulmonary effort is normal.  Musculoskeletal:        General: Normal range of motion.     Cervical back: Normal range of motion.  Neurological:     General: No focal deficit present.     Mental Status: She is alert and oriented to person, place, and time.  Psychiatric:        Attention and Perception: Attention normal. She does not perceive auditory or visual hallucinations.        Mood and Affect: Mood is depressed.        Speech: Speech normal.        Behavior: Behavior normal. Behavior is cooperative.        Thought Content: Thought content is paranoid and delusional. Thought content does not include homicidal or suicidal ideation. Thought content does not include homicidal or suicidal plan.    Review of Systems  Constitutional: Negative for fever.  HENT: Negative for congestion and sore throat.   Respiratory: Negative for cough and shortness of breath.   Cardiovascular: Negative for chest pain.  Gastrointestinal: Negative.   Genitourinary: Negative.   Musculoskeletal: Negative.   Neurological: Positive for dizziness.   Blood pressure 126/73, pulse 88, temperature 97.6 F (36.4 C), temperature source Oral, resp. rate 16, height 5\' 9"  (1.753 m), weight 97.5 kg, SpO2 98 %. Body mass index is 31.75 kg/m.   Treatment Plan Summary: Daily contact with patient to assess and evaluate symptoms and progress in treatment and Medication management   No new labs today  Bipolar 1, current episode manic with psychosis:  -Continue Zyprexa 5 mg PO daily -Increase Zyprexa to 15 mg PO at bedtime  Anxiety:  -Continue  Vistaril 25 mg PO TID PRN  Insomnia:  -Continue Trazodone 100 mg PO at bedtime  Continue CIWA protocol. Patient has a history of alcohol abuse. Will discontinue tomorrow if CIWA score remains low or non-existent. Current score is 1 and alcohol level is <10.   Continue 15 minute safety checks  Encourage participation in the therapeutic milieu Discharge planning in progress    , NP 06/23/2020, 4:04 PM

## 2020-06-24 DIAGNOSIS — F312 Bipolar disorder, current episode manic severe with psychotic features: Secondary | ICD-10-CM | POA: Diagnosis not present

## 2020-06-24 LAB — GLUCOSE, CAPILLARY
Glucose-Capillary: 102 mg/dL — ABNORMAL HIGH (ref 70–99)
Glucose-Capillary: 112 mg/dL — ABNORMAL HIGH (ref 70–99)

## 2020-06-24 LAB — T3, FREE: T3, Free: 4.2 pg/mL (ref 2.0–4.4)

## 2020-06-24 MED ORDER — HYDROXYZINE HCL 10 MG PO TABS
10.0000 mg | ORAL_TABLET | Freq: Two times a day (BID) | ORAL | Status: DC
Start: 2020-06-24 — End: 2020-07-01
  Administered 2020-06-24 – 2020-07-01 (×14): 10 mg via ORAL
  Filled 2020-06-24: qty 14
  Filled 2020-06-24: qty 1
  Filled 2020-06-24 (×3): qty 14
  Filled 2020-06-24 (×2): qty 1
  Filled 2020-06-24: qty 14
  Filled 2020-06-24 (×2): qty 1
  Filled 2020-06-24: qty 14
  Filled 2020-06-24: qty 1
  Filled 2020-06-24 (×4): qty 14
  Filled 2020-06-24: qty 1
  Filled 2020-06-24: qty 14
  Filled 2020-06-24: qty 1
  Filled 2020-06-24 (×2): qty 14

## 2020-06-24 MED ORDER — BACITRACIN-NEOMYCIN-POLYMYXIN 400-5-5000 EX OINT
TOPICAL_OINTMENT | CUTANEOUS | Status: AC
  Administered 2020-06-24: 1
  Filled 2020-06-24: qty 1

## 2020-06-24 NOTE — Progress Notes (Signed)
Recreation Therapy Notes  Date:  5.30.22 Time: 0930 Location: [address] Dayroom  Group Topic: Stress Management  Goal Area(s) Addresses:  Patient will identify positive stress management techniques. Patient will identify benefits of using stress management post d/c.  Intervention: Stress Management  Activity :  Meditation.  LRT played a meditation that focused on being persistent in not only meditation but in life as well.  Patients were to listen and follow along as meditation played to fully engage in activity.  Education:  Stress Management, Discharge Planning.   Education Outcome: Acknowledges Education  Clinical Observations/Feedback: Pt did not attend group session.    Jareth Pardee, LRT/CTRS         Myla Mauriello A 06/24/2020 12:03 PM 

## 2020-06-24 NOTE — Progress Notes (Signed)
   06/23/20 2148  Psych Admission Type (Psych Patients Only)  Admission Status Voluntary  Psychosocial Assessment  Patient Complaints None  Eye Contact Fair  Facial Expression Anxious;Sad;Worried  Affect Appropriate to circumstance  Speech Logical/coherent  Interaction Assertive  Motor Activity Slow  Appearance/Hygiene Unremarkable  Behavior Characteristics Cooperative;Calm  Mood Pleasant  Aggressive Behavior  Effect No apparent injury  Thought Process  Coherency Blocking  Content WDL  Delusions None reported or observed  Perception WDL  Hallucination None reported or observed  Judgment Impaired  Confusion None  Danger to Self  Current suicidal ideation? Denies  Danger to Others  Danger to Others None reported or observed

## 2020-06-24 NOTE — Progress Notes (Signed)
Did not attend. 

## 2020-06-24 NOTE — Progress Notes (Signed)
[facility] Group Notes:  (Nursing/MHT/Case Management/Adjunct)  Date:  06/24/2020  Time:  8:09 PM  Type of Therapy:  Group Therapy  Participation Level:  Did Not Attend  Participation Quality: NA  Affect:  NA Cognitive:  NA  Insight: NA   Engagement in Group: NA  Modes of Intervention:  NA  Summary of Progress/Problems:  Homer Pfeifer Anne Jeison Delpilar 06/24/2020, 8:09 PM 

## 2020-06-24 NOTE — Progress Notes (Addendum)
   06/24/20 1000  Psych Admission Type (Psych Patients Only)  Admission Status Voluntary  Psychosocial Assessment  Patient Complaints Anxiety  Eye Contact Fair  Facial Expression Anxious;Sad;Worried  Affect Appropriate to circumstance  Speech Logical/coherent  Interaction Assertive  Motor Activity Slow  Appearance/Hygiene Unremarkable  Behavior Characteristics Cooperative;Calm  Mood Anxious  Aggressive Behavior  Effect No apparent injury  Thought Process  Coherency Blocking  Content WDL  Delusions None reported or observed  Perception Hallucinations  Hallucination Auditory  Judgment Impaired  Confusion None  Danger to Self  Current suicidal ideation? Denies  Danger to Others  Danger to Others None reported or observed  D. Pt presents with a flat affect/ sad mood- per pt's self inventory, pt rated her depression, hopelessness and anxiety a 4/4/7, respectively. Pt states that her goal to work on today is "coping skills".  Pt currently denies SI/HI, and endorses AH, reporting that the [facility] are very bothersome.  .A. Labs and vitals monitored. Pt given and educated on medications. Pt supported emotionally and encouraged to express concerns and ask questions.   R. Pt remains safe with 15 minute checks. Will continue POC.

## 2020-06-24 NOTE — Tx Team (Signed)
Interdisciplinary Treatment and Diagnostic Plan Update  06/24/2020 Time of Session: 9:50am [name] MRN: [id number]  Principal Diagnosis: Bipolar I disorder, current or most recent episode manic, with psychotic features ([place])  Secondary Diagnoses: Principal Problem:   Bipolar I disorder, current or most recent episode manic, with psychotic features ([place])   Current Medications:  Current Facility-Administered Medications  Medication Dose Route Frequency Provider Last Rate Last Admin  . hydrOXYzine (ATARAX/VISTARIL) tablet 25 mg  25 mg Oral TID PRN Ethelene Hal, NP      . loperamide (IMODIUM) capsule 2-4 mg  2-4 mg Oral PRN Ethelene Hal, NP      . magnesium hydroxide (MILK OF MAGNESIA) suspension 30 mL  30 mL Oral Daily PRN Leevy-Johnson, Brooke A, NP      . multivitamin with minerals tablet 1 tablet  1 tablet Oral Daily Ethelene Hal, NP   1 tablet at 06/24/20 0756  . nicotine polacrilex (NICORETTE) gum 2 mg  2 mg Oral PRN Charmaine Downs C, NP   2 mg at 06/23/20 2053  . OLANZapine (ZYPREXA) tablet 5 mg  5 mg Oral Daily Ethelene Hal, NP   5 mg at 06/24/20 0755  . OLANZapine zydis (ZYPREXA) disintegrating tablet 15 mg  15 mg Oral QHS Ethelene Hal, NP   15 mg at 06/23/20 2050  . OLANZapine zydis (ZYPREXA) disintegrating tablet 5 mg  5 mg Oral Q8H PRN Leevy-Johnson, Brooke A, NP       And  . ziprasidone (GEODON) injection 20 mg  20 mg Intramuscular PRN Leevy-Johnson, Brooke A, NP      . ondansetron (ZOFRAN-ODT) disintegrating tablet 4 mg  4 mg Oral Q6H PRN Ethelene Hal, NP      . thiamine tablet 100 mg  100 mg Oral Daily Ethelene Hal, NP   100 mg at 06/24/20 0756  . traZODone (DESYREL) tablet 100 mg  100 mg Oral QHS Ethelene Hal, NP   100 mg at 06/23/20 2051   PTA Medications: Medications Prior to Admission  Medication Sig Dispense Refill Last Dose  . benztropine (COGENTIN) 1 MG tablet Take 1 tablet (1 mg total)  by mouth 2 (two) times daily as needed for tremors. (Patient not taking: Reported on 06/22/2020) 30 tablet 0 Not Taking at Unknown time  . carbamazepine (TEGRETOL) 100 MG chewable tablet Chew 1 tablet (100 mg) and 2 tablets (200 mg) at bedtime. (Patient not taking: Reported on 06/22/2020) 90 tablet 0 Not Taking at Unknown time  . hydrOXYzine (ATARAX/VISTARIL) 25 MG tablet Take 1 tablet (25 mg total) by mouth 3 (three) times daily as needed for anxiety. (Patient not taking: Reported on 06/22/2020) 30 tablet 0 Not Taking at Unknown time  . metroNIDAZOLE (FLAGYL) 500 MG tablet Take 1 tablet (500 mg total) by mouth 2 (two) times daily for 5 days. (Patient not taking: Reported on 06/22/2020) 10 tablet 0 Not Taking at Unknown time  . risperiDONE (RISPERDAL M-TABS) 2 MG disintegrating tablet Take 1 tablet (2 mg total) by mouth daily. (Patient not taking: Reported on 06/22/2020) 30 tablet 0 Not Taking at Unknown time  . risperiDONE (RISPERDAL M-TABS) 4 MG disintegrating tablet Take 1 tablet (4 mg total) by mouth at bedtime. (Patient not taking: Reported on 06/22/2020) 30 tablet 0 Not Taking at Unknown time  . traZODone (DESYREL) 50 MG tablet Take 1 tablet (50 mg total) by mouth at bedtime as needed for sleep. (Patient not taking: Reported on 06/22/2020) 15 tablet 0  Not Taking at Unknown time    Patient Stressors:    Patient Strengths:    Treatment Modalities: Medication Management, Group therapy, Case management,  1 to 1 session with clinician, Psychoeducation, Recreational therapy.   Physician Treatment Plan for Primary Diagnosis: Bipolar I disorder, current or most recent episode manic, with psychotic features ([place]) Long Term Goal(s): Improvement in symptoms so as ready for discharge   Short Term Goals: Ability to identify changes in lifestyle to reduce recurrence of condition will improve Ability to verbalize feelings will improve Compliance with prescribed medications will improve  Medication  Management: Evaluate patient's response, side effects, and tolerance of medication regimen.  Therapeutic Interventions: 1 to 1 sessions, Unit Group sessions and Medication administration.  Evaluation of Outcomes: Not Met  Physician Treatment Plan for Secondary Diagnosis: Principal Problem:   Bipolar I disorder, current or most recent episode manic, with psychotic features ([place])  Long Term Goal(s): Improvement in symptoms so as ready for discharge   Short Term Goals: Ability to identify changes in lifestyle to reduce recurrence of condition will improve Ability to verbalize feelings will improve Compliance with prescribed medications will improve     Medication Management: Evaluate patient's response, side effects, and tolerance of medication regimen.  Therapeutic Interventions: 1 to 1 sessions, Unit Group sessions and Medication administration.  Evaluation of Outcomes: Not Met   RN Treatment Plan for Primary Diagnosis: Bipolar I disorder, current or most recent episode manic, with psychotic features ([place]) Long Term Goal(s): Knowledge of disease and therapeutic regimen to maintain health will improve  Short Term Goals: Ability to remain free from injury will improve, Ability to verbalize frustration and anger appropriately will improve, Ability to demonstrate self-control, Ability to identify and develop effective coping behaviors will improve and Compliance with prescribed medications will improve  Medication Management: RN will administer medications as ordered by provider, will assess and evaluate patient's response and provide education to patient for prescribed medication. RN will report any adverse and/or side effects to prescribing provider.  Therapeutic Interventions: 1 on 1 counseling sessions, Psychoeducation, Medication administration, Evaluate responses to treatment, Monitor vital signs and CBGs as ordered, Perform/monitor CIWA, COWS, AIMS and Fall Risk screenings as ordered,  Perform wound care treatments as ordered.  Evaluation of Outcomes: Not Met   LCSW Treatment Plan for Primary Diagnosis: Bipolar I disorder, current or most recent episode manic, with psychotic features ([place]) Long Term Goal(s): Safe transition to appropriate next level of care at discharge, Engage patient in therapeutic group addressing interpersonal concerns.  Short Term Goals: Engage patient in aftercare planning with referrals and resources, Increase social support, Increase ability to appropriately verbalize feelings, Identify triggers associated with mental health/substance abuse issues and Increase skills for wellness and recovery  Therapeutic Interventions: Assess for all discharge needs, 1 to 1 time with Social worker, Explore available resources and support systems, Assess for adequacy in community support network, Educate family and significant other(s) on suicide prevention, Complete Psychosocial Assessment, Interpersonal group therapy.  Evaluation of Outcomes: Not Met   Progress in Treatment: Attending groups: Yes. Participating in groups: No. Taking medication as prescribed: Yes. Toleration medication: Yes. Family/Significant other contact made: No, will contact:  declined consents Patient understands diagnosis: Yes. Discussing patient identified problems/goals with staff: Yes. Medical problems stabilized or resolved: Yes. Denies suicidal/homicidal ideation: Yes. Issues/concerns per patient self-inventory: No.   New problem(s) identified: No, Describe:  none  New Short Term/Long Term Goal(s): medication stabilization, elimination of SI thoughts, development of comprehensive mental wellness plan.  Patient Goals:  Did not attend.  Discharge Plan or Barriers:  Patient recently admitted. CSW will continue to follow and assess for appropriate referrals and possible discharge planning.    Reason for Continuation of Hospitalization: Delusions  Medication  stabilization  Estimated Length of Stay: 3-5 days  Attendees: Patient: Did not attend 06/24/2020   Physician:  06/24/2020   Nursing:  06/24/2020   RN Care Manager: 06/24/2020   Social Worker: Darletta Moll, LCSW 06/24/2020   Recreational Therapist:  06/24/2020   Other:  06/24/2020   Other:  06/24/2020  Other: 06/24/2020       Scribe for Treatment Team: Vassie Moselle, LCSW 06/24/2020 10:35 AM

## 2020-06-24 NOTE — Progress Notes (Signed)
[facility] MD Progress Note  06/24/2020 11:42 AM [name]  MRN:  [id number]  Subjective:  [name] reports "I feel a little better today. I slept good last night. I am anxious though."   Objective: [name] is a 40 year old female who presented to [facility], voluntarily, as a walk-in for help with mania. This is the 6th [facility] admission for Bipolar 1 disorder with psychosis for this patient. Previous admissions were on 9/19, 10/19, 4/20, 10/20, and 6/21.  Patient was seen and evaluated, chart reviewed and case discussed with the treatment team. [name] was seen on the unit. She stated she slept well last night, she slept  6.25 hours. She reported a good appetite. She maintains good eye contact. She is speaking with normal tone and rate of speech.She is casually dressed and adequately groomed. There is no thought blocking. Her diastolic BP was recorded as 112/59 at 11:30, however on  recheck at 1400 her BP was 112/90. She did not complain of dizziness today. Will decrease Vistaril to 10 mg PO BID and discontinue Trazodone as this my help support her blood pressure. Patient is calm and cooperative, pleasant on approach. She denies SI/HI and VH. She has some mild paranoia and hears very low voices. She does not appear to be responding to internal stimuli. She is aware she needs to be in the hospital to get back on medications. She stated she thinks the medications are She stated she feels safe in the hospital and is able to contract for safety. Patient is taking her medications without any issues. She has not needed and PRN medication for anxiety since admission. Will continue to monitor for safety. Encouragement and support provided.   No new labs today.   Principal Problem: Bipolar I disorder, current or most recent episode manic, with psychotic features (H[facility]) Diagnosis: Principal Problem:   Bipolar I disorder, current or most recent episode manic, with psychotic features (H[facility])  Total Time spent with patient:  25 minutes  Past Psychiatric History: See H&P  Past Medical History:  Past Medical History:  Diagnosis Date  . ADD 10/22/2009  . ALLERGIC RHINITIS 09/26/2008  . Anemia   . ANXIETY 09/26/2008  . Asthma   . Complication of anesthesia    "tempermental" per patient, "aggiated and hostile"  . CONSTIPATION 09/26/2008  . DEPRESSION 09/26/2008  . FATIGUE 01/14/2009  . Head injury with loss of consciousness (HCC)   . Headache   . HYPERLIPIDEMIA 09/26/2008  . IBS (irritable bowel syndrome)   . OTITIS MEDIA, LEFT 01/14/2009  . SKIN LESION 09/26/2008  . Sleep apnea   . TONSILLITIS, ACUTE 01/14/2009  . Unconscious state (HCC) 10/13/2017  . URI 10/22/2009    Past Surgical History:  Procedure Laterality Date  . CERVICAL POLYPECTOMY  2009  . CERVICAL POLYPECTOMY  2019  . DILATATION & CURETTAGE/HYSTEROSCOPY WITH MYOSURE N/A 11/17/2017   Procedure: DILATATION & CURETTAGE/HYSTEROSCOPY WITH MYOSURE;  Surgeon: Geryl Rankins, MD;  Location: [facility];  Service: Gynecology;  Laterality: N/A;  . ORIF ANKLE FRACTURE Right 08/24/2018   Procedure: OPEN REDUCTION INTERNAL FIXATION TRIMALLEOLAR ANKLE FRACTURE;  Surgeon: Eldred Manges, MD;  Location: [facility] OR;  Service: Orthopedics;  Laterality: Right;   Family History:  Family History  Problem Relation Age of Onset  . Diabetes Other   . Hypertension Other   . Thyroid disease Mother   . Hypertension Mother    Family Psychiatric  History: See H&P Social History:  Social History   Substance and Sexual  Activity  Alcohol Use Not Currently     Social History   Substance and Sexual Activity  Drug Use No    Social History   Socioeconomic History  . Marital status: Single    Spouse name: Not on file  . Number of children: Not on file  . Years of education: Not on file  . Highest education level: Not on file  Occupational History  . Occupation: full time waitress  Tobacco Use  . Smoking status: Current Every Day Smoker    Packs/day: 1.00    Years: 20.00     Pack years: 20.00    Types: Cigarettes  . Smokeless tobacco: Never Used  Vaping Use  . Vaping Use: Former  Substance and Sexual Activity  . Alcohol use: Not Currently  . Drug use: No  . Sexual activity: Not Currently    Birth control/protection: Abstinence  Other Topics Concern  . Not on file  Social History Narrative   Lives with mother   Social Determinants of Health   Financial Resource Strain: Not on file  Food Insecurity: Not on file  Transportation Needs: Not on file  Physical Activity: Not on file  Stress: Not on file  Social Connections: Not on file   Additional Social History:    Pain Medications: See MAR Prescriptions: See MAR Over the Counter: See MAR History of alcohol / drug use?: No history of alcohol / drug abuse Longest period of sobriety (when/how long): UTA Negative Consequences of Use: Personal relationships    Sleep: Fair  Appetite:  Good  Current Medications: Current Facility-Administered Medications  Medication Dose Route Frequency Provider Last Rate Last Admin  . loperamide (IMODIUM) capsule 2-4 mg  2-4 mg Oral PRN Laveda Abbe, NP      . magnesium hydroxide (MILK OF MAGNESIA) suspension 30 mL  30 mL Oral Daily PRN Leevy-Johnson, Brooke A, NP      . multivitamin with minerals tablet 1 tablet  1 tablet Oral Daily Laveda Abbe, NP   1 tablet at 06/24/20 0756  . nicotine polacrilex (NICORETTE) gum 2 mg  2 mg Oral PRN Dahlia Byes C, NP   2 mg at 06/23/20 2053  . OLANZapine (ZYPREXA) tablet 5 mg  5 mg Oral Daily Laveda Abbe, NP   5 mg at 06/24/20 0755  . OLANZapine zydis (ZYPREXA) disintegrating tablet 15 mg  15 mg Oral QHS Laveda Abbe, NP   15 mg at 06/23/20 2050  . OLANZapine zydis (ZYPREXA) disintegrating tablet 5 mg  5 mg Oral Q8H PRN Leevy-Johnson, Brooke A, NP       And  . ziprasidone (GEODON) injection 20 mg  20 mg Intramuscular PRN Leevy-Johnson, Brooke A, NP      . ondansetron (ZOFRAN-ODT)  disintegrating tablet 4 mg  4 mg Oral Q6H PRN Laveda Abbe, NP      . thiamine tablet 100 mg  100 mg Oral Daily Laveda Abbe, NP   100 mg at 06/24/20 2993    Lab Results:  Results for orders placed or performed during the hospital encounter of 06/21/20 (from the past 48 hour(s))  Glucose, capillary     Status: Abnormal   Collection Time: 06/22/20 12:17 PM  Result Value Ref Range   Glucose-Capillary 127 (H) 70 - 99 mg/dL    Comment: Glucose reference range applies only to samples taken after fasting for at least 8 hours.  Glucose, capillary     Status: Abnormal   Collection Time:  06/22/20  5:23 PM  Result Value Ref Range   Glucose-Capillary 115 (H) 70 - 99 mg/dL    Comment: Glucose reference range applies only to samples taken after fasting for at least 8 hours.  T4, free     Status: Abnormal   Collection Time: 06/22/20  5:30 PM  Result Value Ref Range   Free T4 1.17 (H) 0.61 - 1.12 ng/dL    Comment: (NOTE) Biotin ingestion may interfere with free T4 tests. If the results are inconsistent with the TSH level, previous test results, or the clinical presentation, then consider biotin interference. If needed, order repeat testing after stopping biotin. Performed at [facility], [address], [place], [place] [address]   T3, free     Status: None   Collection Time: 06/22/20  5:30 PM  Result Value Ref Range   T3, Free 4.2 2.0 - 4.4 pg/mL    Comment: (NOTE) Performed At: [facility] [address] [place], [place] [id number] Jolene Schimke MD CB:[id number]   Ethanol     Status: None   Collection Time: 06/22/20  5:30 PM  Result Value Ref Range   Alcohol, Ethyl (B) <10 <10 mg/dL    Comment: (NOTE) Lowest detectable limit for serum alcohol is 10 mg/dL.  For medical purposes only. Performed at [facility], [address], [place], [place] [address]   Glucose, capillary     Status: None   Collection Time: 06/22/20  8:37 PM   Result Value Ref Range   Glucose-Capillary 96 70 - 99 mg/dL    Comment: Glucose reference range applies only to samples taken after fasting for at least 8 hours.  Glucose, capillary     Status: Abnormal   Collection Time: 06/23/20  5:55 AM  Result Value Ref Range   Glucose-Capillary 115 (H) 70 - 99 mg/dL    Comment: Glucose reference range applies only to samples taken after fasting for at least 8 hours.  Comprehensive metabolic panel     Status: Abnormal   Collection Time: 06/23/20  6:19 AM  Result Value Ref Range   Sodium 136 135 - 145 mmol/L   Potassium 3.8 3.5 - 5.1 mmol/L   Chloride 104 98 - 111 mmol/L   CO2 24 22 - 32 mmol/L   Glucose, Bld 131 (H) 70 - 99 mg/dL    Comment: Glucose reference range applies only to samples taken after fasting for at least 8 hours.   BUN 7 6 - 20 mg/dL   Creatinine, Ser 7.10 0.44 - 1.00 mg/dL   Calcium 9.1 8.9 - 62.6 mg/dL   Total Protein 7.1 6.5 - 8.1 g/dL   Albumin 4.3 3.5 - 5.0 g/dL   AST 40 15 - 41 U/L   ALT 68 (H) 0 - 44 U/L   Alkaline Phosphatase 108 38 - 126 U/L   Total Bilirubin 0.4 0.3 - 1.2 mg/dL   GFR, Estimated >94 >85 mL/min    Comment: (NOTE) Calculated using the CKD-EPI Creatinine Equation (2021)    Anion gap 8 5 - 15    Comment: Performed at [facility], [address], [place], [place] [address]  Glucose, capillary     Status: None   Collection Time: 06/23/20 12:08 PM  Result Value Ref Range   Glucose-Capillary 91 70 - 99 mg/dL    Comment: Glucose reference range applies only to samples taken after fasting for at least 8 hours.  Glucose, capillary     Status:  Abnormal   Collection Time: 06/24/20  5:45 AM  Result Value Ref Range   Glucose-Capillary 102 (H) 70 - 99 mg/dL    Comment: Glucose reference range applies only to samples taken after fasting for at least 8 hours.   Comment 1 Notify RN     Blood Alcohol level:  Lab Results  Component Value Date   ETH <10 06/22/2020   ETH <10 07/23/2019     Metabolic Disorder Labs: Lab Results  Component Value Date   HGBA1C 5.4 07/25/2019   MPG 108 07/25/2019   MPG 99.67 05/26/2018   No results found for: PROLACTIN Lab Results  Component Value Date   CHOL 148 06/22/2020   TRIG 111 06/22/2020   HDL 46 06/22/2020   CHOLHDL 3.2 06/22/2020   VLDL 22 06/22/2020   LDLCALC 80 06/22/2020   LDLCALC 80 07/25/2019    Physical Findings: AIMS:  , ,  ,  ,    CIWA:  CIWA-Ar Total: 1 COWS:     Musculoskeletal: Strength & Muscle Tone: within normal limits Gait & Station: normal Patient leans: N/A  Psychiatric Specialty Exam:  Presentation  General Appearance: Appropriate for Environment; Fairly Groomed; Casual  Eye Contact:Good  Speech:Clear and Coherent; Normal Rate  Speech Volume:Normal  Handedness:Right  Mood and Affect  Mood:Anxious; Depressed  Affect:Constricted (guarded, suspicious)  Thought Process  Thought Processes:Coherent  Descriptions of Associations:Tangential  Orientation:Full (Time, Place and Person)  Thought Content:Tangential; Paranoid Ideation; Logical  History of Schizophrenia/Schizoaffective disorder:No  Duration of Psychotic Symptoms:Greater than six months  Hallucinations:No data recorded  Ideas of Reference:Paranoia  Suicidal Thoughts:No data recorded  Homicidal Thoughts:No data recorded  Sensorium  Memory:Immediate Fair; Recent Fair; Remote Fair  Judgment:Fair  Insight:Fair  Executive Functions  Concentration:Fair  Attention Span:Fair  Recall:Fair  Fund of Knowledge:Good  Language:Good  Psychomotor Activity  Psychomotor Activity:No data recorded  Assets  Assets:Communication Skills; Resilience; Physical Health; Leisure Time; Desire for Improvement; Housing; Social Support  Sleep  Sleep:No data recorded  Physical Exam: Physical Exam Vitals and nursing note reviewed.  Constitutional:      Appearance: Normal appearance.  HENT:     Head: Normocephalic.   Pulmonary:     Effort: Pulmonary effort is normal.  Musculoskeletal:        General: Normal range of motion.     Cervical back: Normal range of motion.  Neurological:     General: No focal deficit present.     Mental Status: She is alert and oriented to person, place, and time.  Psychiatric:        Attention and Perception: Attention normal. She does not perceive auditory or visual hallucinations.        Mood and Affect: Mood is depressed.        Speech: Speech normal.        Behavior: Behavior normal. Behavior is cooperative.        Thought Content: Thought content is paranoid and delusional. Thought content does not include homicidal or suicidal ideation. Thought content does not include homicidal or suicidal plan.    Review of Systems  Constitutional: Negative for fever.  HENT: Negative for congestion and sore throat.   Respiratory: Negative for cough and shortness of breath.   Cardiovascular: Negative for chest pain.  Gastrointestinal: Negative.   Genitourinary: Negative.   Musculoskeletal: Negative.   Neurological: Positive for dizziness.   Blood pressure (!) 112/59, pulse 94, temperature 97.8 F (36.6 C), temperature source Oral, resp. rate 20, height 5\' 9"  (1.753 m),  weight 97.5 kg, SpO2 98 %. Body mass index is 31.75 kg/m.   Treatment Plan Summary: Daily contact with patient to assess and evaluate symptoms and progress in treatment and Medication management   No new labs today  Bipolar 1, current episode manic with psychosis:  -Continue Zyprexa 5 mg PO daily -Continue Zyprexa to 15 mg PO at bedtime  Anxiety:  -Change Vistaril from 25 mg to 10 mg PO BID PRN, to help avoid  low blood pressure issues. Patient has not been taking Vistaril since admission, may look at discontinuing tomorrow if patient does not take any in the next 24 hours.   Insomnia:  -DiscontinueTrazodone from 100 mg PO at bedtime, to help alleviate low blood pressure issues  Discontinue CIWA  protocol.  Continue 15 minute safety checks  Encourage participation in the therapeutic milieu Discharge planning in progress    Laveda Abbe, NP 06/24/2020, 11:42 AM

## 2020-06-24 NOTE — [facility] Suicide Risk Assessment (Signed)
[facility] INPATIENT:  Family/Significant Other Suicide Prevention Education  Suicide Prevention Education:  Patient Refusal for Family/Significant Other Suicide Prevention Education: The patient [name] has refused to provide written consent for family/significant other to be provided Family/Significant Other Suicide Prevention Education during admission and/or prior to discharge.  Physician notified.  Felizardo Hoffmann 06/24/2020, 8:50 AM

## 2020-06-25 DIAGNOSIS — F312 Bipolar disorder, current episode manic severe with psychotic features: Secondary | ICD-10-CM | POA: Diagnosis not present

## 2020-06-25 LAB — HEMOGLOBIN A1C
Hgb A1c MFr Bld: 5.3 % (ref 4.8–5.6)
Mean Plasma Glucose: 105 mg/dL

## 2020-06-25 MED ORDER — OLANZAPINE 10 MG PO TABS: 10.0000 mg | ORAL_TABLET | Freq: Every day | ORAL | Status: DC

## 2020-06-25 MED ORDER — OLANZAPINE 10 MG PO TBDP
20.0000 mg | ORAL_TABLET | Freq: Every day | ORAL | Status: DC
  Administered 2020-06-25 – 2020-06-30 (×6): 20 mg via ORAL
  Filled 2020-06-25: qty 2
  Filled 2020-06-25: qty 14
  Filled 2020-06-25: qty 2
  Filled 2020-06-25 (×2): qty 14
  Filled 2020-06-25: qty 2
  Filled 2020-06-25 (×3): qty 14

## 2020-06-25 MED ORDER — MENTHOL 3 MG MT LOZG: 1.0000 | LOZENGE | OROMUCOSAL | Status: DC | PRN

## 2020-06-25 MED ORDER — OLANZAPINE 5 MG PO TABS
5.0000 mg | ORAL_TABLET | Freq: Every day | ORAL | Status: DC
  Administered 2020-06-26: 5 mg via ORAL
  Filled 2020-06-25 (×2): qty 1

## 2020-06-25 MED ORDER — TRAZODONE HCL 50 MG PO TABS
50.0000 mg | ORAL_TABLET | Freq: Every evening | ORAL | Status: DC | PRN
  Administered 2020-06-30: 50 mg via ORAL
  Filled 2020-06-25 (×2): qty 7
  Filled 2020-06-25: qty 1

## 2020-06-25 NOTE — Progress Notes (Signed)
[facility] MD Progress Note  06/25/2020 9:28 AM [name]  MRN:  [id number]  Subjective:  [name] reports "I feel a little better today. I slept good last night. I am anxious though."   Objective: [name] is a 40 year old female who presented to [facility], voluntarily, as a walk-in for help with mania. This is the 6th [facility] admission for Bipolar 1 disorder with psychosis for this patient. Previous admissions were on 9/19, 10/19, 4/20, 10/20, and 6/21.  Patient was seen and evaluated, chart reviewed and case discussed with the treatment team. [name] was seen on the unit. She stated she slept well last night, however she only she slept 4 hours. She reported a good appetite. She maintains good eye contact. Her speech mildly rapid today. She is endorsing some very soft auditory hallucinations.  Will increase her nighttime Zyprexa to 20 mg. Her thought process is coherent and goal oriented. There is no thought blocking.  Will decrease Vistaril to 10 mg PO BID.  Patient is calm and cooperative, pleasant on approach. She denies SI/HI and VH. She denies paranoia today. She does not appear to be responding to internal stimuli.  She is glad she is back on medication and stated her mother told her she has to take the medication if she wants to continue to live with them. She stated she feels safe in the hospital and is able to contract for safety. Patient is taking her medications without any issues. Will continue to monitor for safety. Encouragement and support provided.   No new labs today.   Principal Problem: Bipolar I disorder, current or most recent episode manic, with psychotic features (HCC) Diagnosis: Principal Problem:   Bipolar I disorder, current or most recent episode manic, with psychotic features (HCC)  Total Time spent with patient: 25 minutes  Past Psychiatric History: See H&P  Past Medical History:  Past Medical History:  Diagnosis Date  . ADD 10/22/2009  . ALLERGIC RHINITIS 09/26/2008  .  Anemia   . ANXIETY 09/26/2008  . Asthma   . Complication of anesthesia    "tempermental" per patient, "aggiated and hostile"  . CONSTIPATION 09/26/2008  . DEPRESSION 09/26/2008  . FATIGUE 01/14/2009  . Head injury with loss of consciousness (HCC)   . Headache   . HYPERLIPIDEMIA 09/26/2008  . IBS (irritable bowel syndrome)   . OTITIS MEDIA, LEFT 01/14/2009  . SKIN LESION 09/26/2008  . Sleep apnea   . TONSILLITIS, ACUTE 01/14/2009  . Unconscious state (HCC) 10/13/2017  . URI 10/22/2009    Past Surgical History:  Procedure Laterality Date  . CERVICAL POLYPECTOMY  2009  . CERVICAL POLYPECTOMY  2019  . DILATATION & CURETTAGE/HYSTEROSCOPY WITH MYOSURE N/A 11/17/2017   Procedure: DILATATION & CURETTAGE/HYSTEROSCOPY WITH MYOSURE;  Surgeon: Geryl Rankins, MD;  Location: [facility];  Service: Gynecology;  Laterality: N/A;  . ORIF ANKLE FRACTURE Right 08/24/2018   Procedure: OPEN REDUCTION INTERNAL FIXATION TRIMALLEOLAR ANKLE FRACTURE;  Surgeon: Eldred Manges, MD;  Location: [facility] OR;  Service: Orthopedics;  Laterality: Right;   Family History:  Family History  Problem Relation Age of Onset  . Diabetes Other   . Hypertension Other   . Thyroid disease Mother   . Hypertension Mother    Family Psychiatric  History: See H&P Social History:  Social History   Substance and Sexual Activity  Alcohol Use Not Currently     Social History   Substance and Sexual Activity  Drug Use No    Social History  Socioeconomic History  . Marital status: Single    Spouse name: Not on file  . Number of children: Not on file  . Years of education: Not on file  . Highest education level: Not on file  Occupational History  . Occupation: full time waitress  Tobacco Use  . Smoking status: Current Every Day Smoker    Packs/day: 1.00    Years: 20.00    Pack years: 20.00    Types: Cigarettes  . Smokeless tobacco: Never Used  Vaping Use  . Vaping Use: Former  Substance and Sexual Activity  . Alcohol use: Not  Currently  . Drug use: No  . Sexual activity: Not Currently    Birth control/protection: Abstinence  Other Topics Concern  . Not on file  Social History Narrative   Lives with mother   Social Determinants of Health   Financial Resource Strain: Not on file  Food Insecurity: Not on file  Transportation Needs: Not on file  Physical Activity: Not on file  Stress: Not on file  Social Connections: Not on file   Additional Social History:    Pain Medications: See MAR Prescriptions: See MAR Over the Counter: See MAR History of alcohol / drug use?: No history of alcohol / drug abuse Longest period of sobriety (when/how long): UTA Negative Consequences of Use: Personal relationships    Sleep: Fair  Appetite:  Good  Current Medications: Current Facility-Administered Medications  Medication Dose Route Frequency Provider Last Rate Last Admin  . hydrOXYzine (ATARAX/VISTARIL) tablet 10 mg  10 mg Oral BID Laveda AbbeParks, Dariana Garbett Britton, NP   10 mg at 06/25/20 0814  . magnesium hydroxide (MILK OF MAGNESIA) suspension 30 mL  30 mL Oral Daily PRN Leevy-Johnson, Brooke A, NP      . menthol-cetylpyridinium (CEPACOL) lozenge 3 mg  1 lozenge Oral PRN Melbourne Abtsaylor, Cody W, PA-C      . multivitamin with minerals tablet 1 tablet  1 tablet Oral Daily Laveda AbbeParks, Abdallah Hern Britton, NP   1 tablet at 06/25/20 0815  . nicotine polacrilex (NICORETTE) gum 2 mg  2 mg Oral PRN Dahlia Byesnuoha, Josephine C, NP   2 mg at 06/24/20 1653  . OLANZapine (ZYPREXA) tablet 5 mg  5 mg Oral Daily Laveda AbbeParks, Gal Feldhaus Britton, NP   5 mg at 06/25/20 0815  . OLANZapine zydis (ZYPREXA) disintegrating tablet 15 mg  15 mg Oral QHS Laveda AbbeParks, Tarrah Furuta Britton, NP   15 mg at 06/24/20 2052  . OLANZapine zydis (ZYPREXA) disintegrating tablet 5 mg  5 mg Oral Q8H PRN Leevy-Johnson, Brooke A, NP       And  . ziprasidone (GEODON) injection 20 mg  20 mg Intramuscular PRN Leevy-Johnson, Brooke A, NP        Lab Results:  Results for orders placed or performed during the  hospital encounter of 06/21/20 (from the past 48 hour(s))  Glucose, capillary     Status: None   Collection Time: 06/23/20 12:08 PM  Result Value Ref Range   Glucose-Capillary 91 70 - 99 mg/dL    Comment: Glucose reference range applies only to samples taken after fasting for at least 8 hours.  Glucose, capillary     Status: Abnormal   Collection Time: 06/24/20  5:45 AM  Result Value Ref Range   Glucose-Capillary 102 (H) 70 - 99 mg/dL    Comment: Glucose reference range applies only to samples taken after fasting for at least 8 hours.   Comment 1 Notify RN   Glucose, capillary  Status: Abnormal   Collection Time: 06/24/20  5:30 PM  Result Value Ref Range   Glucose-Capillary 112 (H) 70 - 99 mg/dL    Comment: Glucose reference range applies only to samples taken after fasting for at least 8 hours.    Blood Alcohol level:  Lab Results  Component Value Date   ETH <10 06/22/2020   ETH <10 07/23/2019    Metabolic Disorder Labs: Lab Results  Component Value Date   HGBA1C 5.3 06/22/2020   MPG 105 06/22/2020   MPG 108 07/25/2019   No results found for: PROLACTIN Lab Results  Component Value Date   CHOL 148 06/22/2020   TRIG 111 06/22/2020   HDL 46 06/22/2020   CHOLHDL 3.2 06/22/2020   VLDL 22 06/22/2020   LDLCALC 80 06/22/2020   LDLCALC 80 07/25/2019    Physical Findings: AIMS:  , ,  ,  ,    CIWA:  CIWA-Ar Total: 1 COWS:     Musculoskeletal: Strength & Muscle Tone: within normal limits Gait & Station: normal Patient leans: N/A  Psychiatric Specialty Exam:  Presentation  General Appearance: Appropriate for Environment; Fairly Groomed; Casual  Eye Contact:Good  Speech:Clear and Coherent; Normal Rate  Speech Volume:Normal  Handedness:Right  Mood and Affect  Mood:Anxious; Depressed  Affect:Constricted (guarded, suspicious)  Thought Process  Thought Processes:Coherent  Descriptions of Associations:Tangential  Orientation:Full (Time, Place and  Person)  Thought Content:Tangential; Paranoid Ideation; Logical  History of Schizophrenia/Schizoaffective disorder:No  Duration of Psychotic Symptoms:Greater than six months  Hallucinations: None Ideas of Reference:Paranoia  Suicidal Thoughts:No data recorded  Homicidal Thoughts:No data recorded  Sensorium  Memory:Immediate Fair; Recent Fair; Remote Fair  Judgment:Fair  Insight:Fair  Executive Functions  Concentration:Fair  Attention Span:Fair  Recall:Fair  Fund of Knowledge:Good  Language:Good  Psychomotor Activity  Psychomotor Activity: Normal Assets  Assets:Communication Skills; Resilience; Physical Health; Leisure Time; Desire for Improvement; Housing; Social Support  Sleep  Sleep: 4 hours Physical Exam: Physical Exam Vitals and nursing note reviewed.  Constitutional:      Appearance: Normal appearance.  HENT:     Head: Normocephalic.  Pulmonary:     Effort: Pulmonary effort is normal.  Musculoskeletal:        General: Normal range of motion.     Cervical back: Normal range of motion.  Neurological:     General: No focal deficit present.     Mental Status: She is alert and oriented to person, place, and time.  Psychiatric:        Attention and Perception: Attention normal. She does not perceive auditory or visual hallucinations.        Mood and Affect: Mood is depressed.        Speech: Speech normal.        Behavior: Behavior normal. Behavior is cooperative.        Thought Content: Thought content is paranoid and delusional. Thought content does not include homicidal or suicidal ideation. Thought content does not include homicidal or suicidal plan.    Review of Systems  Constitutional: Negative for fever.  HENT: Negative for congestion and sore throat.   Respiratory: Negative for cough and shortness of breath.   Cardiovascular: Negative for chest pain.  Gastrointestinal: Negative.   Genitourinary: Negative.   Musculoskeletal: Negative.    Neurological: Positive for dizziness.   Blood pressure (!) 128/99, pulse (!) 111, temperature 98 F (36.7 C), temperature source Oral, resp. rate 20, height 5\' 9"  (1.753 m), weight 97.5 kg, SpO2 99 %. Body mass index is  31.75 kg/m.   Treatment Plan Summary: Daily contact with patient to assess and evaluate symptoms and progress in treatment and Medication management   No new labs today  Bipolar 1, current episode manic with psychosis:  -Continue Zyprexa 5 mg PO daily -Increase Zyprexa to 20 mg PO at bedtime  Anxiety:  -Continue Vistaril 10 mg PO BID  Insomnia:  -Intiate Trazodone 50 mg PO at bedtime PRN for sleep    Continue 15 minute safety checks  Encourage participation in the therapeutic milieu Discharge planning in progress    Laveda Abbe, NP 06/25/2020, 9:28 AM

## 2020-06-25 NOTE — Progress Notes (Signed)
Pt participated in a fun activity in group. 

## 2020-06-25 NOTE — Progress Notes (Signed)
Adult Psychoeducational Group Note  Date:  06/25/2020 Time:  10:44 AM  Group Topic/Focus:  Goals Group:   The focus of this group is to help patients establish daily goals to achieve during treatment and discuss how the patient can incorporate goal setting into their daily lives to aide in recovery.  Participation Level:  Active  Participation Quality:  Appropriate  Affect:  Appropriate  Cognitive:  Appropriate  Insight: Appropriate  Engagement in Group:  Engaged  Modes of Intervention:  Discussion  Additional Comments:  Pt attended group and participated in discussion.  Saquoia Sianez R Kaizen Ibsen 06/25/2020, 10:44 AM

## 2020-06-25 NOTE — Progress Notes (Signed)
   06/24/20 2100  Psych Admission Type (Psych Patients Only)  Admission Status Voluntary  Psychosocial Assessment  Patient Complaints Depression  Eye Contact Fair  Facial Expression Anxious;Sad;Worried  Affect Appropriate to circumstance  Speech Logical/coherent  Interaction Assertive  Motor Activity Slow  Appearance/Hygiene Unremarkable  Behavior Characteristics Appropriate to situation  Mood Pleasant  Aggressive Behavior  Effect No apparent injury  Thought Process  Coherency Blocking  Content WDL  Delusions None reported or observed  Perception Hallucinations  Hallucination Auditory  Judgment Impaired  Confusion None  Danger to Self  Current suicidal ideation? Denies  Danger to Others  Danger to Others None reported or observed

## 2020-06-25 NOTE — Progress Notes (Signed)
Recreation Therapy Notes  Animal-Assisted Activity (AAA) Program Checklist/Progress Notes Patient Eligibility Criteria Checklist & Daily Group note for Rec Tx Intervention  Date: 5.31.22 Time: 1430 Location: [address]   AAA/T Program Assumption of Risk Form signed by Engineer, production or Parent Legal Guardian YES   Patient is free of allergies or severe asthma  YES  Patient reports no fear of animals  YES  Patient reports no history of cruelty to animals YES   Patient understands his/her participation is voluntary YES  Patient washes hands before animal contact  YES   Patient washes hands after animal contact  YES  Education: Charity fundraiser, Appropriate Animal Interaction   Education Outcome: Acknowledges understanding/In group clarification offered/Needs additional education.   Clinical Observations/Feedback:  Pt did not attend activity.    Caroll Rancher, LRT/CTRS         Caroll Rancher A 06/25/2020 3:50 PM

## 2020-06-25 NOTE — Progress Notes (Signed)
   D:  Patient presents with anxiety and depression this evening with level of 7/10.  Patient complained of feeling weak and congestion, "cold symptoms."  Per patient she did not attend group today, "just wanted to lay around and sleep."  Patient denies pain.  Patient denies SI/HI, but verbally contracts to see staff should the thoughts arise.  Patient denies AVH.  A:  Labs/Vitals monitored; Medication education provided; Patient supported emotionally; Patient asked to communicate her needs, concerns, and questions.  R:  patient remains safe with 15 minute checks;  Will continue POC.      06/25/20 2300  Psych Admission Type (Psych Patients Only)  Admission Status Voluntary  Psychosocial Assessment  Patient Complaints Anxiety;Depression  Eye Contact Fair  Facial Expression Anxious;Sad;Worried  Affect Appropriate to circumstance  Speech Logical/coherent  Interaction Assertive  Motor Activity Slow  Appearance/Hygiene Unremarkable  Behavior Characteristics Cooperative  Mood Depressed  Thought Process  Coherency Blocking  Content WDL  Delusions None reported or observed  Perception Hallucinations  Hallucination Auditory  Judgment Impaired  Confusion None  Danger to Self  Current suicidal ideation? Denies  Danger to Others  Danger to Others None reported or observed

## 2020-06-25 NOTE — Progress Notes (Signed)
Psychoeducational Group Note  Date:  06/25/2020 Time:  2000  Group Topic/Focus:  wrap up group  Participation Level: Did Not Attend  Participation Quality:  Not Applicable  Affect:  Not Applicable  Cognitive:  Not Applicable  Insight:  Not Applicable  Engagement in Group: Not Applicable  Additional Comments:  Did not attend.  Johann Capers S 06/25/2020, 9:04 PM

## 2020-06-26 DIAGNOSIS — F312 Bipolar disorder, current episode manic severe with psychotic features: Secondary | ICD-10-CM | POA: Diagnosis not present

## 2020-06-26 LAB — RESP PANEL BY RT-PCR (FLU A&B, COVID) ARPGX2
Influenza A by PCR: NEGATIVE
Influenza B by PCR: NEGATIVE
SARS Coronavirus 2 by RT PCR: NEGATIVE

## 2020-06-26 MED ORDER — ESCITALOPRAM OXALATE 5 MG PO TABS
5.0000 mg | ORAL_TABLET | Freq: Every day | ORAL | Status: DC
  Administered 2020-06-26 – 2020-07-01 (×6): 5 mg via ORAL
  Filled 2020-06-26 (×2): qty 1
  Filled 2020-06-26 (×2): qty 7
  Filled 2020-06-26 (×5): qty 1
  Filled 2020-06-26: qty 7

## 2020-06-26 NOTE — [facility] Group Notes (Signed)
The focus of this group is to help patients establish daily goals to achieve during treatment and discuss how the patient can incorporate goal setting into their daily lives to aide in recovery.  Pt did not attend group 

## 2020-06-26 NOTE — Progress Notes (Signed)
Recreation Therapy Notes  Date: 6.1.22 Time: 0930 Location: [address]  Group Topic: Stress Management   Goal Area(s) Addresses:  Patient will actively participate in stress management techniques presented during session.  Patient will successfully identify benefit of practicing stress management post d/c.   Intervention: Guided exercise with ambient sound and script  Activity :Guided Imagery  LRT read a beach visualization script to patients.  Patients were asked to participate in the technique introduced during introduction. Patients were given suggestions of ways to access scripts post d/c and encouraged to explore Youtube and other apps available on smartphones, tablets, and computers.   Education:  Stress Management, Discharge Planning.   Education Outcome: Acknowledges education  Clinical Observations/Feedback: Patient did not attend group session.   Bonne Whack, LRT/CTRS         Dorwin Fitzhenry A 06/26/2020 12:37 PM 

## 2020-06-26 NOTE — [facility] Suicide Risk Assessment (Signed)
[facility] INPATIENT:  Family/Significant Other Suicide Prevention Education  Suicide Prevention Education:  Education Completed; [name] [phone] (Mother) has been identified by the patient as the family member/significant other with whom the patient will be residing, and identified as the person(s) who will aid the patient in the event of a mental health crisis (suicidal ideations/suicide attempt).  With written consent from the patient, the family member/significant other has been provided the following suicide prevention education, prior to the and/or following the discharge of the patient.  The suicide prevention education provided includes the following:  Suicide risk factors  Suicide prevention and interventions  National Suicide Hotline telephone number  [facility] assessment telephone number  [facility] Emergency Assistance 911  [facility] and/or Residential Mobile Crisis Unit telephone number  Request made of family/significant other to:  Remove weapons (e.g., guns, rifles, knives), all items previously/currently identified as safety concern.    Remove drugs/medications (over-the-counter, prescriptions, illicit drugs), all items previously/currently identified as a safety concern.  The family member/significant other verbalizes understanding of the suicide prevention education information provided.  The family member/significant other agrees to remove the items of safety concern listed above.  CSW spoke with [name] who states that her daughter often does not continue with her medications after discharge and that this is her 5th episode in approximately 3 years.  [name] states that her daughter can come home but she will need to remain on her medications and go to all of her appointments.  [name] states that there are no weapons or firearms in the home.  CSW completed SPE with [name].   [name] 06/26/2020, 3:57 PM

## 2020-06-26 NOTE — Progress Notes (Signed)
[facility] MD Progress Note  06/26/2020 9:28 AM [name]  MRN:  [id number]  Subjective:  [name] reports "I feel a little better today. I slept good last night. I am anxious though."   Objective: [name] is a 40 year old female who presented to [facility], voluntarily, as a walk-in for help with mania. This is the 6th [facility] admission for Bipolar 1 disorder with psychosis for this patient. Previous admissions were on 9/19, 10/19, 4/20, 10/20, and 6/21.  Patient was seen and evaluated, chart reviewed and case discussed with the treatment team. [name] was seen on the unit. She stated she slept well last night, however she only she slept 5 hours. She reported a good appetite. She maintains good eye contact. Her speech is slower today. She rated her depression as 7/10 and anxiety 4/10 today. We discussed adding a low dose of Lexapro, she is in agreement. She stated "I have taken this before and I feel like it worked."  Will discontinue the morning Zyprexa as she is sleeping off and on during the day. This may help her nighttime sleep.  Her thought process is coherent and goal oriented. There is no thought blocking. She  Patient is calm and cooperative, pleasant on approach. She denies SI/HI and VH. She denies auditory hallucinations and paranoia today. She does not appear to be responding to internal stimuli.  She is complaining of upper respiratory symptoms; congestion, sinus pain and sore throat. Ordered a COVID test, awaiting results.  She stated she feels safe in the hospital and is able to contract for safety. Patient is taking her medications without any issues. Will continue to monitor for safety. Vital signs are stable; BP 101/67, pulse 98, O2 98%, she is afebrile. Encouragement and support provided.    Principal Problem: Bipolar I disorder, current or most recent episode manic, with psychotic features (HCC) Diagnosis: Principal Problem:   Bipolar I disorder, current or most recent episode manic, with  psychotic features (HCC)  Total Time spent with patient: 25 minutes  Past Psychiatric History: See H&P  Past Medical History:  Past Medical History:  Diagnosis Date  . ADD 10/22/2009  . ALLERGIC RHINITIS 09/26/2008  . Anemia   . ANXIETY 09/26/2008  . Asthma   . Complication of anesthesia    "tempermental" per patient, "aggiated and hostile"  . CONSTIPATION 09/26/2008  . DEPRESSION 09/26/2008  . FATIGUE 01/14/2009  . Head injury with loss of consciousness (HCC)   . Headache   . HYPERLIPIDEMIA 09/26/2008  . IBS (irritable bowel syndrome)   . OTITIS MEDIA, LEFT 01/14/2009  . SKIN LESION 09/26/2008  . Sleep apnea   . TONSILLITIS, ACUTE 01/14/2009  . Unconscious state (HCC) 10/13/2017  . URI 10/22/2009    Past Surgical History:  Procedure Laterality Date  . CERVICAL POLYPECTOMY  2009  . CERVICAL POLYPECTOMY  2019  . DILATATION & CURETTAGE/HYSTEROSCOPY WITH MYOSURE N/A 11/17/2017   Procedure: DILATATION & CURETTAGE/HYSTEROSCOPY WITH MYOSURE;  Surgeon: Geryl Rankins, MD;  Location: [facility];  Service: Gynecology;  Laterality: N/A;  . ORIF ANKLE FRACTURE Right 08/24/2018   Procedure: OPEN REDUCTION INTERNAL FIXATION TRIMALLEOLAR ANKLE FRACTURE;  Surgeon: Eldred Manges, MD;  Location: [facility] OR;  Service: Orthopedics;  Laterality: Right;   Family History:  Family History  Problem Relation Age of Onset  . Diabetes Other   . Hypertension Other   . Thyroid disease Mother   . Hypertension Mother    Family Psychiatric  History: See H&P Social History:  Social History   Substance and Sexual Activity  Alcohol Use Not Currently     Social History   Substance and Sexual Activity  Drug Use No    Social History   Socioeconomic History  . Marital status: Single    Spouse name: Not on file  . Number of children: Not on file  . Years of education: Not on file  . Highest education level: Not on file  Occupational History  . Occupation: full time waitress  Tobacco Use  . Smoking status:  Current Every Day Smoker    Packs/day: 1.00    Years: 20.00    Pack years: 20.00    Types: Cigarettes  . Smokeless tobacco: Never Used  Vaping Use  . Vaping Use: Former  Substance and Sexual Activity  . Alcohol use: Not Currently  . Drug use: No  . Sexual activity: Not Currently    Birth control/protection: Abstinence  Other Topics Concern  . Not on file  Social History Narrative   Lives with mother   Social Determinants of Health   Financial Resource Strain: Not on file  Food Insecurity: Not on file  Transportation Needs: Not on file  Physical Activity: Not on file  Stress: Not on file  Social Connections: Not on file   Additional Social History:    Pain Medications: See MAR Prescriptions: See MAR Over the Counter: See MAR History of alcohol / drug use?: No history of alcohol / drug abuse Longest period of sobriety (when/how long): UTA Negative Consequences of Use: Personal relationships    Sleep: Fair  Appetite:  Good  Current Medications: Current Facility-Administered Medications  Medication Dose Route Frequency Provider Last Rate Last Admin  . hydrOXYzine (ATARAX/VISTARIL) tablet 10 mg  10 mg Oral BID Laveda Abbe, NP   10 mg at 06/26/20 0817  . magnesium hydroxide (MILK OF MAGNESIA) suspension 30 mL  30 mL Oral Daily PRN Leevy-Johnson, Brooke A, NP      . menthol-cetylpyridinium (CEPACOL) lozenge 3 mg  1 lozenge Oral PRN Melbourne Abts W, PA-C      . multivitamin with minerals tablet 1 tablet  1 tablet Oral Daily Laveda Abbe, NP   1 tablet at 06/26/20 0817  . nicotine polacrilex (NICORETTE) gum 2 mg  2 mg Oral PRN Dahlia Byes C, NP   2 mg at 06/25/20 1636  . OLANZapine zydis (ZYPREXA) disintegrating tablet 20 mg  20 mg Oral QHS Laveda Abbe, NP   20 mg at 06/25/20 2128  . OLANZapine zydis (ZYPREXA) disintegrating tablet 5 mg  5 mg Oral Q8H PRN Leevy-Johnson, Brooke A, NP       And  . ziprasidone (GEODON) injection 20 mg  20 mg  Intramuscular PRN Leevy-Johnson, Brooke A, NP      . traZODone (DESYREL) tablet 50 mg  50 mg Oral QHS PRN Laveda Abbe, NP        Lab Results:  Results for orders placed or performed during the hospital encounter of 06/21/20 (from the past 48 hour(s))  Glucose, capillary     Status: Abnormal   Collection Time: 06/24/20  5:30 PM  Result Value Ref Range   Glucose-Capillary 112 (H) 70 - 99 mg/dL    Comment: Glucose reference range applies only to samples taken after fasting for at least 8 hours.    Blood Alcohol level:  Lab Results  Component Value Date   ETH <10 06/22/2020   ETH <10 07/23/2019    Metabolic Disorder  Labs: Lab Results  Component Value Date   HGBA1C 5.3 06/22/2020   MPG 105 06/22/2020   MPG 108 07/25/2019   No results found for: PROLACTIN Lab Results  Component Value Date   CHOL 148 06/22/2020   TRIG 111 06/22/2020   HDL 46 06/22/2020   CHOLHDL 3.2 06/22/2020   VLDL 22 06/22/2020   LDLCALC 80 06/22/2020   LDLCALC 80 07/25/2019    Physical Findings: AIMS:  , ,  ,  ,    CIWA:  CIWA-Ar Total: 1 COWS:     Musculoskeletal: Strength & Muscle Tone: within normal limits Gait & Station: normal Patient leans: N/A  Psychiatric Specialty Exam:  Presentation  General Appearance: Appropriate for Environment; Fairly Groomed; Casual  Eye Contact:Good  Speech:Clear and Coherent; Normal Rate  Speech Volume:Normal  Handedness:Right  Mood and Affect  Mood:Anxious; Depressed  Affect:Constricted (guarded, suspicious)  Thought Process  Thought Processes:Coherent  Descriptions of Associations:Tangential  Orientation:Full (Time, Place and Person)  Thought Content:Tangential; Paranoid Ideation; Logical  History of Schizophrenia/Schizoaffective disorder:No  Duration of Psychotic Symptoms:Greater than six months  Hallucinations: None Ideas of Reference:Paranoia  Suicidal Thoughts:No data recorded  Homicidal Thoughts:No data  recorded  Sensorium  Memory:Immediate Fair; Recent Fair; Remote Fair  Judgment:Fair  Insight:Fair  Executive Functions  Concentration:Fair  Attention Span:Fair  Recall:Fair  Fund of Knowledge:Good  Language:Good  Psychomotor Activity  Psychomotor Activity: Normal Assets  Assets:Communication Skills; Resilience; Physical Health; Leisure Time; Desire for Improvement; Housing; Social Support  Sleep  Sleep: 4 hours Physical Exam: Physical Exam Vitals and nursing note reviewed.  Constitutional:      Appearance: Normal appearance.  HENT:     Head: Normocephalic.  Pulmonary:     Effort: Pulmonary effort is normal.  Musculoskeletal:        General: Normal range of motion.     Cervical back: Normal range of motion.  Neurological:     General: No focal deficit present.     Mental Status: She is alert and oriented to person, place, and time.  Psychiatric:        Attention and Perception: Attention normal. She does not perceive auditory or visual hallucinations.        Mood and Affect: Mood is depressed.        Speech: Speech normal.        Behavior: Behavior normal. Behavior is cooperative.        Thought Content: Thought content is paranoid and delusional. Thought content does not include homicidal or suicidal ideation. Thought content does not include homicidal or suicidal plan.    Review of Systems  Constitutional: Negative for fever.  HENT: Negative for congestion and sore throat.   Respiratory: Negative for cough and shortness of breath.   Cardiovascular: Negative for chest pain.  Gastrointestinal: Negative.   Genitourinary: Negative.   Musculoskeletal: Negative.   Neurological: Positive for dizziness.   Blood pressure 101/67, pulse 98, temperature 98 F (36.7 C), temperature source Oral, resp. rate 20, height 5\' 9"  (1.753 m), weight 97.5 kg, SpO2 98 %. Body mass index is 31.75 kg/m.   Treatment Plan Summary: Daily contact with patient to assess and  evaluate symptoms and progress in treatment and Medication management   No new labs today  Bipolar 1, current episode manic with psychosis:  -Discontinue Zyprexa 5 mg PO daily due to daytime sleepiness -Continue Zyprexa to 20 mg PO at bedtime  Depression/Anxiety:  -Start Lexapro 5 mg PO daily  Anxiety:  -Continue Vistaril 10 mg PO  BID  Insomnia:  -Continue Trazodone 50 mg PO at bedtime PRN for sleep  Patient complains of sinus pain, sore throat and congestion. Awaiting COVID results.  -Continue Cepacol lozenges for sore throat   Continue 15 minute safety checks  Encourage participation in the therapeutic milieu Discharge planning in progress    Laveda AbbeLaurie Britton Eliot Popper, NP 06/26/2020, 2:13 PM

## 2020-06-26 NOTE — Progress Notes (Addendum)
   06/26/20 2015  Psych Admission Type (Psych Patients Only)  Admission Status Voluntary  Psychosocial Assessment  Patient Complaints Anxiety;Depression  Eye Contact Fair  Facial Expression Flat  Affect Anxious;Appropriate to circumstance  Speech Logical/coherent  Interaction Assertive  Motor Activity Slow  Appearance/Hygiene Unremarkable  Behavior Characteristics Cooperative;Anxious  Mood Depressed;Anxious  Thought Process  Coherency Concrete thinking  Content WDL  Delusions None reported or observed  Perception WDL  Hallucination None reported or observed  Judgment Poor  Confusion None  Danger to Self  Current suicidal ideation? Denies  Danger to Others  Danger to Others None reported or observed   Pt denies SI, HI, AVH and pain. Pt says she has been in the room most of the day because she hasn't felt good. Pt follow-up COVID test is negative. Pt endorses sinus congestion and sore throat. Pt says she has seasonal allergies and will take a Claritin now and again.

## 2020-06-26 NOTE — [facility] Group Notes (Signed)
Topic: Feelings   Due to the acuity and complex discharge plans, group was not held. Patient was provided therapeutic worksheets and asked to meet with CSW as needed. 

## 2020-06-26 NOTE — Plan of Care (Signed)
Mostly in bed but pleasant on approach. Denied SI/HI.

## 2020-06-26 NOTE — Progress Notes (Signed)
[facility] Group Notes:  (Nursing/MHT/Case Management/Adjunct)  Date:  06/26/2020  Time:  11:33 PM  Type of Therapy:  DNA group  Participation Level:  Did Not Attend  Participation Quality:  DNA  Affect:  DNA  Cognitive:  DNA  Insight:  None  Engagement in Group:  DNA  Modes of Intervention:  DNA  Summary of Progress/Problems: Did not attend group  Annell Greening Casina 06/26/2020, 11:33 PM

## 2020-06-27 DIAGNOSIS — F312 Bipolar disorder, current episode manic severe with psychotic features: Secondary | ICD-10-CM | POA: Diagnosis not present

## 2020-06-27 MED ORDER — GUAIFENESIN ER 600 MG PO TB12
600.0000 mg | ORAL_TABLET | Freq: Two times a day (BID) | ORAL | Status: DC
  Administered 2020-06-27 – 2020-07-01 (×7): 600 mg via ORAL
  Filled 2020-06-27: qty 14
  Filled 2020-06-27 (×3): qty 1
  Filled 2020-06-27: qty 14
  Filled 2020-06-27: qty 1
  Filled 2020-06-27: qty 14
  Filled 2020-06-27 (×2): qty 1
  Filled 2020-06-27: qty 14
  Filled 2020-06-27 (×2): qty 1

## 2020-06-27 MED ORDER — ACETAMINOPHEN 325 MG PO TABS: 650.0000 mg | ORAL_TABLET | Freq: Four times a day (QID) | ORAL | Status: DC | PRN

## 2020-06-27 NOTE — Progress Notes (Signed)
   06/27/20 2123  Psych Admission Type (Psych Patients Only)  Admission Status Voluntary  Psychosocial Assessment  Patient Complaints Anxiety;Confusion;Irritability;Isolation  Eye Contact Fair  Facial Expression Flat  Affect Anxious;Appropriate to circumstance;Irritable  Speech Logical/coherent  Interaction Avoidant;Forwards little;Minimal  Motor Activity Slow  Appearance/Hygiene Unremarkable  Behavior Characteristics Cooperative;Anxious;Irritable;Guarded  Mood Depressed;Anxious;Irritable  Thought Process  Coherency Concrete thinking  Content WDL  Delusions None reported or observed  Perception WDL  Hallucination None reported or observed  Judgment Poor  Confusion None  Danger to Self  Current suicidal ideation? Denies  Danger to Others  Danger to Others None reported or observed

## 2020-06-27 NOTE — Progress Notes (Signed)
Pt did not attend goals group. 

## 2020-06-27 NOTE — Progress Notes (Addendum)
[facility] MD Progress Note  06/27/2020 1:46 PM [name]  MRN:  [id number]  Subjective:  Birdell reports "I feel a better today, I have a head cold and some congestion today."   Objective: [name] is a 40 year old female who presented to [facility], voluntarily, as a walk-in for help with mania. This is the 6th [facility] admission for Bipolar 1 disorder with psychosis for this patient. Previous admissions were on 9/19, 10/19, 4/20, 10/20, and 6/21.  Patient was seen and evaluated, chart reviewed and case discussed with the treatment team. [name] was seen on the unit. She stated she slept well last night, record shows she slept 5 hours, which is improved from the previous night. She is doing well on Zyprexa. She agrees to continue to take her medications after she is discharged, however she historically she does not take her medications after discharge. She reported a good appetite. She maintains good eye contact. Her speech is slower today. Her thought content is logical She rated her depression as 5/10 and anxiety 5/10 today. Her thought process is coherent and goal oriented. There is no thought blocking. She is taking her medications and has no issues with them. Patient is calm and cooperative, pleasant on approach. She denies SI/HI and VH. She denies auditory hallucinations and paranoia today. She does not appear to be responding to internal stimuli. She continues to complain of  congestion and a headache. Her COVID test was negative. Will start guaifenisen for congestion.She  She is able to contract for safety. Will continue to monitor for safety. Vital signs are stable, she is afebrile. Encouragement and support provided.    Principal Problem: Bipolar I disorder, current or most recent episode manic, with psychotic features (HCC) Diagnosis: Principal Problem:   Bipolar I disorder, current or most recent episode manic, with psychotic features (HCC)  Total Time spent with patient: 25 minutes  Past  Psychiatric History: See H&P  Past Medical History:  Past Medical History:  Diagnosis Date  . ADD 10/22/2009  . ALLERGIC RHINITIS 09/26/2008  . Anemia   . ANXIETY 09/26/2008  . Asthma   . Complication of anesthesia    "tempermental" per patient, "aggiated and hostile"  . CONSTIPATION 09/26/2008  . DEPRESSION 09/26/2008  . FATIGUE 01/14/2009  . Head injury with loss of consciousness (HCC)   . Headache   . HYPERLIPIDEMIA 09/26/2008  . IBS (irritable bowel syndrome)   . OTITIS MEDIA, LEFT 01/14/2009  . SKIN LESION 09/26/2008  . Sleep apnea   . TONSILLITIS, ACUTE 01/14/2009  . Unconscious state (HCC) 10/13/2017  . URI 10/22/2009    Past Surgical History:  Procedure Laterality Date  . CERVICAL POLYPECTOMY  2009  . CERVICAL POLYPECTOMY  2019  . DILATATION & CURETTAGE/HYSTEROSCOPY WITH MYOSURE N/A 11/17/2017   Procedure: DILATATION & CURETTAGE/HYSTEROSCOPY WITH MYOSURE;  Surgeon: Geryl Rankins, MD;  Location: [facility];  Service: Gynecology;  Laterality: N/A;  . ORIF ANKLE FRACTURE Right 08/24/2018   Procedure: OPEN REDUCTION INTERNAL FIXATION TRIMALLEOLAR ANKLE FRACTURE;  Surgeon: Eldred Manges, MD;  Location: [facility] OR;  Service: Orthopedics;  Laterality: Right;   Family History:  Family History  Problem Relation Age of Onset  . Diabetes Other   . Hypertension Other   . Thyroid disease Mother   . Hypertension Mother    Family Psychiatric  History: See H&P Social History:  Social History   Substance and Sexual Activity  Alcohol Use Not Currently     Social History   Substance  and Sexual Activity  Drug Use No    Social History   Socioeconomic History  . Marital status: Single    Spouse name: Not on file  . Number of children: Not on file  . Years of education: Not on file  . Highest education level: Not on file  Occupational History  . Occupation: full time waitress  Tobacco Use  . Smoking status: Current Every Day Smoker    Packs/day: 1.00    Years: 20.00    Pack years:  20.00    Types: Cigarettes  . Smokeless tobacco: Never Used  Vaping Use  . Vaping Use: Former  Substance and Sexual Activity  . Alcohol use: Not Currently  . Drug use: No  . Sexual activity: Not Currently    Birth control/protection: Abstinence  Other Topics Concern  . Not on file  Social History Narrative   Lives with mother   Social Determinants of Health   Financial Resource Strain: Not on file  Food Insecurity: Not on file  Transportation Needs: Not on file  Physical Activity: Not on file  Stress: Not on file  Social Connections: Not on file   Additional Social History:    Pain Medications: See MAR Prescriptions: See MAR Over the Counter: See MAR History of alcohol / drug use?: No history of alcohol / drug abuse Longest period of sobriety (when/how long): UTA Negative Consequences of Use: Personal relationships    Sleep: Fair  Appetite:  Good  Current Medications: Current Facility-Administered Medications  Medication Dose Route Frequency Provider Last Rate Last Admin  . escitalopram (LEXAPRO) tablet 5 mg  5 mg Oral Daily Laveda Abbe, NP   5 mg at 06/27/20 0816  . guaiFENesin (MUCINEX) 12 hr tablet 600 mg  600 mg Oral BID Laveda Abbe, NP      . hydrOXYzine (ATARAX/VISTARIL) tablet 10 mg  10 mg Oral BID Laveda Abbe, NP   10 mg at 06/27/20 0816  . magnesium hydroxide (MILK OF MAGNESIA) suspension 30 mL  30 mL Oral Daily PRN Leevy-Johnson, Brooke A, NP      . menthol-cetylpyridinium (CEPACOL) lozenge 3 mg  1 lozenge Oral PRN Melbourne Abts W, PA-C      . multivitamin with minerals tablet 1 tablet  1 tablet Oral Daily Laveda Abbe, NP   1 tablet at 06/27/20 0816  . nicotine polacrilex (NICORETTE) gum 2 mg  2 mg Oral PRN Dahlia Byes C, NP   2 mg at 06/25/20 1636  . OLANZapine zydis (ZYPREXA) disintegrating tablet 20 mg  20 mg Oral QHS Laveda Abbe, NP   20 mg at 06/26/20 2103  . OLANZapine zydis (ZYPREXA)  disintegrating tablet 5 mg  5 mg Oral Q8H PRN Leevy-Johnson, Brooke A, NP       And  . ziprasidone (GEODON) injection 20 mg  20 mg Intramuscular PRN Leevy-Johnson, Brooke A, NP      . traZODone (DESYREL) tablet 50 mg  50 mg Oral QHS PRN Laveda Abbe, NP        Lab Results:  Results for orders placed or performed during the hospital encounter of 06/21/20 (from the past 48 hour(s))  Resp Panel by RT-PCR (Flu A&B, Covid) Nasopharyngeal Swab     Status: None   Collection Time: 06/26/20  4:12 PM   Specimen: Nasopharyngeal Swab; Nasopharyngeal(NP) swabs in vial transport medium  Result Value Ref Range   SARS Coronavirus 2 by RT PCR NEGATIVE NEGATIVE    Comment: (NOTE)  SARS-CoV-2 target nucleic acids are NOT DETECTED.  The SARS-CoV-2 RNA is generally detectable in upper respiratory specimens during the acute phase of infection. The lowest concentration of SARS-CoV-2 viral copies this assay can detect is 138 copies/mL. A negative result does not preclude SARS-Cov-2 infection and should not be used as the sole basis for treatment or other patient management decisions. A negative result may occur with  improper specimen collection/handling, submission of specimen other than nasopharyngeal swab, presence of viral mutation(s) within the areas targeted by this assay, and inadequate number of viral copies(<138 copies/mL). A negative result must be combined with clinical observations, patient history, and epidemiological information. The expected result is Negative.  Fact Sheet for Patients:  BloggerCourse.com  Fact Sheet for Healthcare Providers:  SeriousBroker.it  This test is no t yet approved or cleared by the [place] FDA and  has been authorized for detection and/or diagnosis of SARS-CoV-2 by FDA under an Emergency Use Authorization (EUA). This EUA will remain  in effect (meaning this test can be used) for the duration of  the COVID-19 declaration under Section 564(b)(1) of the Act, 21 U.S.C.section 360bbb-3(b)(1), unless the authorization is terminated  or revoked sooner.       Influenza A by PCR NEGATIVE NEGATIVE   Influenza B by PCR NEGATIVE NEGATIVE    Comment: (NOTE) The Xpert Xpress SARS-CoV-2/FLU/RSV plus assay is intended as an aid in the diagnosis of influenza from Nasopharyngeal swab specimens and should not be used as a sole basis for treatment. Nasal washings and aspirates are unacceptable for Xpert Xpress SARS-CoV-2/FLU/RSV testing.  Fact Sheet for Patients: BloggerCourse.com  Fact Sheet for Healthcare Providers: SeriousBroker.it  This test is not yet approved or cleared by the [place] FDA and has been authorized for detection and/or diagnosis of SARS-CoV-2 by FDA under an Emergency Use Authorization (EUA). This EUA will remain in effect (meaning this test can be used) for the duration of the COVID-19 declaration under Section 564(b)(1) of the Act, 21 U.S.C. section 360bbb-3(b)(1), unless the authorization is terminated or revoked.  Performed at [facility], [address], [place], [place] [address]     Blood Alcohol level:  Lab Results  Component Value Date   ETH <10 06/22/2020   ETH <10 07/23/2019    Metabolic Disorder Labs: Lab Results  Component Value Date   HGBA1C 5.3 06/22/2020   MPG 105 06/22/2020   MPG 108 07/25/2019   No results found for: PROLACTIN Lab Results  Component Value Date   CHOL 148 06/22/2020   TRIG 111 06/22/2020   HDL 46 06/22/2020   CHOLHDL 3.2 06/22/2020   VLDL 22 06/22/2020   LDLCALC 80 06/22/2020   LDLCALC 80 07/25/2019    Physical Findings: AIMS:  , ,  ,  ,    CIWA:  CIWA-Ar Total: 1 COWS:     Musculoskeletal: Strength & Muscle Tone: within normal limits Gait & Station: normal Patient leans: N/A  Psychiatric Specialty Exam:  Presentation  General  Appearance: Appropriate for Environment; Casual  Eye Contact:Good  Speech:Clear and Coherent; Normal Rate  Speech Volume:Normal  Handedness:Right  Mood and Affect  Mood:Depressed  Affect:Congruent  Thought Process  Thought Processes:Coherent; Goal Directed  Descriptions of Associations:Intact  Orientation:Full (Time, Place and Person)  Thought Content:Logical  History of Schizophrenia/Schizoaffective disorder:No  Duration of Psychotic Symptoms:Greater than six months  Hallucinations: None Ideas of Reference:None  Suicidal Thoughts:Suicidal Thoughts: No  Homicidal Thoughts:Homicidal Thoughts: No  Sensorium  Memory:Immediate Good; Recent Good; Remote  Fair  Judgment:Fair  Insight:Fair  Executive Functions  Concentration:Good  Attention Span:Good  Recall:Good  Fund of Knowledge:Good  Language:Good  Psychomotor Activity  Psychomotor Activity: Normal Assets  Assets:Communication Skills; Desire for Improvement; Housing; Resilience; Social Support  Sleep  Sleep: 5 hours  Physical Exam: Physical Exam Vitals and nursing note reviewed.  Constitutional:      Appearance: Normal appearance.  HENT:     Head: Normocephalic.  Pulmonary:     Effort: Pulmonary effort is normal.  Musculoskeletal:        General: Normal range of motion.     Cervical back: Normal range of motion.  Neurological:     General: No focal deficit present.     Mental Status: She is alert and oriented to person, place, and time.  Psychiatric:        Attention and Perception: Attention normal. She does not perceive auditory or visual hallucinations.        Mood and Affect: Mood is depressed.        Speech: Speech normal.        Behavior: Behavior normal. Behavior is cooperative.        Thought Content: Thought content is paranoid and delusional. Thought content does not include homicidal or suicidal ideation. Thought content does not include homicidal or suicidal plan.    Review  of Systems  Constitutional: Negative for fever.  HENT: Positive for congestion. Negative for sore throat.   Respiratory: Negative for cough and shortness of breath.   Cardiovascular: Negative for chest pain.  Gastrointestinal: Negative.   Genitourinary: Negative.   Musculoskeletal: Negative.    Blood pressure 128/77, pulse 96, temperature 98.2 F (36.8 C), temperature source Oral, resp. rate 20, height 5\' 9"  (1.753 m), weight 97.5 kg, SpO2 98 %. Body mass index is 31.75 kg/m.   Treatment Plan Summary: Daily contact with patient to assess and evaluate symptoms and progress in treatment and Medication management   No new labs today  Bipolar 1, current episode manic with psychosis:  -Discontinued Zyprexa 5 mg PO daily due to daytime sleepiness -Continue Zyprexa to 20 mg PO at bedtime  Depression/Anxiety:  -Start Lexapro 5 mg PO daily  Anxiety:  -Continue Vistaril 10 mg PO BID  Insomnia:  -Continue Trazodone 50 mg PO at bedtime PRN for sleep  Patient complains of sinus pain, sore throat and congestion. -COVID negative on 06/26/2020.  -Continue Cepacol lozenges for sore throat -Start guaifenisen 600 mg PO BID for congestion   Continue 15 minute safety checks  Encourage participation in the therapeutic milieu Discharge planning in progress    Laveda AbbeLaurie Britton Wynell Halberg, NP 06/27/2020, 1:46 PM

## 2020-06-27 NOTE — Progress Notes (Signed)
     06/27/20 0900  Psych Admission Type (Psych Patients Only)  Admission Status Voluntary  Psychosocial Assessment  Patient Complaints Anxiety  Eye Contact Fair  Facial Expression Flat  Affect Anxious;Appropriate to circumstance  Speech Logical/coherent  Interaction Assertive  Motor Activity Slow  Appearance/Hygiene Unremarkable  Behavior Characteristics Cooperative;Anxious  Mood Anxious;Depressed  Thought Process  Coherency WDL  Content WDL  Delusions None reported or observed  Perception WDL  Hallucination None reported or observed  Judgment Poor  Confusion None  Danger to Self  Current suicidal ideation? Denies  Danger to Others  Danger to Others None reported or observed      COVID-19 Daily Checkoff  Have you had a fever (temp > 37.80C/100F)  in the past 24 hours?  No  If you have had runny nose, nasal congestion, sneezing in the past 24 hours, has it worsened? No  COVID-19 EXPOSURE  Have you traveled outside the state in the past 14 days? No  Have you been in contact with someone with a confirmed diagnosis of COVID-19 or PUI in the past 14 days without wearing appropriate PPE? No  Have you been living in the same home as a person with confirmed diagnosis of COVID-19 or a PUI (household contact)? No  Have you been diagnosed with COVID-19? No

## 2020-06-27 NOTE — [facility] Group Notes (Signed)
Patient did not attend Nutrition group. 

## 2020-06-27 NOTE — [facility] Group Notes (Signed)
Patient did not attend goals group.  

## 2020-06-28 NOTE — Progress Notes (Signed)
[facility]  06/28/2020 1:50 PM Benson Settingmanda J Shankland  MRN:  [id number] Subjective: Patient is a 40 year old female with a past psychiatric history significant for bipolar disorder, most recently manic who presented to the behavioral health hospital voluntarily for treatment of bipolar disorder 1, manic, with psychotic features.  Objective: Patient is seen and examined.  Patient is a 40 year old female with the above-stated past psychiatric history is seen in follow-up.  She stated she feels better today.  She stated she is not feeling lethargic.  She stated her mood feels better.  She did state that she had been discussing things with her mother.  Her cold symptoms seem to be stable, and her previous COVID test was negative.  Her sleep is good and got over 6 hours of sleep last night.  She denied any side effects to her current medications.  No suicidal ideation, no auditory or visual hallucinations.  Her vital signs are stable, she is afebrile.  Pulse oximetry on room air was 97%.  As stated above her sleep last night was good.  COVID-19 from 6/1 was negative.  Principal Problem: Bipolar I disorder, current or most recent episode manic, with psychotic features (HCC) Diagnosis: Principal Problem:   Bipolar I disorder, current or most recent episode manic, with psychotic features (HCC)  Total Time spent with patient: 20 minutes  Past Psychiatric History: See admission H&P  Past Medical History:  Past Medical History:  Diagnosis Date  . ADD 10/22/2009  . ALLERGIC RHINITIS 09/26/2008  . Anemia   . ANXIETY 09/26/2008  . Asthma   . Complication of anesthesia    "tempermental" per patient, "aggiated and hostile"  . CONSTIPATION 09/26/2008  . DEPRESSION 09/26/2008  . FATIGUE 01/14/2009  . Head injury with loss of consciousness (HCC)   . Headache   . HYPERLIPIDEMIA 09/26/2008  . IBS (irritable bowel syndrome)   . OTITIS MEDIA, LEFT 01/14/2009  . SKIN LESION 09/26/2008  . Sleep apnea   . TONSILLITIS,  ACUTE 01/14/2009  . Unconscious state (HCC) 10/13/2017  . URI 10/22/2009    Past Surgical History:  Procedure Laterality Date  . CERVICAL POLYPECTOMY  2009  . CERVICAL POLYPECTOMY  2019  . DILATATION & CURETTAGE/HYSTEROSCOPY WITH MYOSURE N/A 11/17/2017   Procedure: DILATATION & CURETTAGE/HYSTEROSCOPY WITH MYOSURE;  Surgeon: Geryl RankinsVarnado, Evelyn, MD;  Location: [facility];  Service: Gynecology;  Laterality: N/A;  . ORIF ANKLE FRACTURE Right 08/24/2018   Procedure: OPEN REDUCTION INTERNAL FIXATION TRIMALLEOLAR ANKLE FRACTURE;  Surgeon: Eldred MangesYates, Mark C, MD;  Location: [facility] OR;  Service: Orthopedics;  Laterality: Right;   Family History:  Family History  Problem Relation Age of Onset  . Diabetes Other   . Hypertension Other   . Thyroid disease Mother   . Hypertension Mother    Family Psychiatric  History: See admission H&P Social History:  Social History   Substance and Sexual Activity  Alcohol Use Not Currently     Social History   Substance and Sexual Activity  Drug Use No    Social History   Socioeconomic History  . Marital status: Single    Spouse name: Not on file  . Number of children: Not on file  . Years of education: Not on file  . Highest education level: Not on file  Occupational History  . Occupation: full time waitress  Tobacco Use  . Smoking status: Current Every Day Smoker    Packs/day: 1.00    Years: 20.00    Pack years: 20.00  Types: Cigarettes  . Smokeless tobacco: Never Used  Vaping Use  . Vaping Use: Former  Substance and Sexual Activity  . Alcohol use: Not Currently  . Drug use: No  . Sexual activity: Not Currently    Birth control/protection: Abstinence  Other Topics Concern  . Not on file  Social History Narrative   Lives with mother   Social Determinants of Health   Financial Resource Strain: Not on file  Food Insecurity: Not on file  Transportation Needs: Not on file  Physical Activity: Not on file  Stress: Not on file  Social Connections:  Not on file   Additional Social History:    Pain Medications: See MAR Prescriptions: See MAR Over the Counter: See MAR History of alcohol / drug use?: No history of alcohol / drug abuse Longest period of sobriety (when/how long): UTA Negative Consequences of Use: Personal relationships                    Sleep: Good  Appetite:  Good  Current Medications: Current Facility-Administered Medications  Medication Dose Route Frequency Provider Last Rate Last Admin  . acetaminophen (TYLENOL) tablet 650 mg  650 mg Oral Q6H PRN Laveda Abbe, NP      . escitalopram (LEXAPRO) tablet 5 mg  5 mg Oral Daily Laveda Abbe, NP   5 mg at 06/28/20 0953  . guaiFENesin (MUCINEX) 12 hr tablet 600 mg  600 mg Oral BID Laveda Abbe, NP   600 mg at 06/28/20 0300  . hydrOXYzine (ATARAX/VISTARIL) tablet 10 mg  10 mg Oral BID Laveda Abbe, NP   10 mg at 06/28/20 0953  . magnesium hydroxide (MILK OF MAGNESIA) suspension 30 mL  30 mL Oral Daily PRN Leevy-Johnson, Brooke A, NP      . menthol-cetylpyridinium (CEPACOL) lozenge 3 mg  1 lozenge Oral PRN Jaclyn Shaggy, PA-C      . multivitamin with minerals tablet 1 tablet  1 tablet Oral Daily Laveda Abbe, NP   1 tablet at 06/28/20 [phone]  . nicotine polacrilex (NICORETTE) gum 2 mg  2 mg Oral PRN Dahlia Byes C, NP   2 mg at 06/28/20 0956  . OLANZapine zydis (ZYPREXA) disintegrating tablet 20 mg  20 mg Oral QHS Laveda Abbe, NP   20 mg at 06/27/20 2123  . OLANZapine zydis (ZYPREXA) disintegrating tablet 5 mg  5 mg Oral Q8H PRN Leevy-Johnson, Brooke A, NP       And  . ziprasidone (GEODON) injection 20 mg  20 mg Intramuscular PRN Leevy-Johnson, Brooke A, NP      . traZODone (DESYREL) tablet 50 mg  50 mg Oral QHS PRN Laveda Abbe, NP        Lab Results:  Results for orders placed or performed during the hospital encounter of 06/21/20 (from the past 48 hour(s))  Resp Panel by RT-PCR (Flu A&B,  Covid) Nasopharyngeal Swab     Status: None   Collection Time: 06/26/20  4:12 PM   Specimen: Nasopharyngeal Swab; Nasopharyngeal(NP) swabs in vial transport medium  Result Value Ref Range   SARS Coronavirus 2 by RT PCR NEGATIVE NEGATIVE    Comment: (NOTE) SARS-CoV-2 target nucleic acids are NOT DETECTED.  The SARS-CoV-2 RNA is generally detectable in upper respiratory specimens during the acute phase of infection. The lowest concentration of SARS-CoV-2 viral copies this assay can detect is 138 copies/mL. A negative result does not preclude SARS-Cov-2 infection and should not be used as  the sole basis for treatment or other patient management decisions. A negative result may occur with  improper specimen collection/handling, submission of specimen other than nasopharyngeal swab, presence of viral mutation(s) within the areas targeted by this assay, and inadequate number of viral copies(<138 copies/mL). A negative result must be combined with clinical observations, patient history, and epidemiological information. The expected result is Negative.  Fact Sheet for Patients:  BloggerCourse.com  Fact Sheet for Healthcare Providers:  SeriousBroker.it  This test is no t yet approved or cleared by the [place] FDA and  has been authorized for detection and/or diagnosis of SARS-CoV-2 by FDA under an Emergency Use Authorization (EUA). This EUA will remain  in effect (meaning this test can be used) for the duration of the COVID-19 declaration under Section 564(b)(1) of the Act, 21 U.S.C.section 360bbb-3(b)(1), unless the authorization is terminated  or revoked sooner.       Influenza A by PCR NEGATIVE NEGATIVE   Influenza B by PCR NEGATIVE NEGATIVE    Comment: (NOTE) The Xpert Xpress SARS-CoV-2/FLU/RSV plus assay is intended as an aid in the diagnosis of influenza from Nasopharyngeal swab specimens and should not be used as a sole  basis for treatment. Nasal washings and aspirates are unacceptable for Xpert Xpress SARS-CoV-2/FLU/RSV testing.  Fact Sheet for Patients: BloggerCourse.com  Fact Sheet for Healthcare Providers: SeriousBroker.it  This test is not yet approved or cleared by the [place] FDA and has been authorized for detection and/or diagnosis of SARS-CoV-2 by FDA under an Emergency Use Authorization (EUA). This EUA will remain in effect (meaning this test can be used) for the duration of the COVID-19 declaration under Section 564(b)(1) of the Act, 21 U.S.C. section 360bbb-3(b)(1), unless the authorization is terminated or revoked.  Performed at [facility], [address], [place], [place] [address]     Blood Alcohol level:  Lab Results  Component Value Date   ETH <10 06/22/2020   ETH <10 07/23/2019    Metabolic Disorder Labs: Lab Results  Component Value Date   HGBA1C 5.3 06/22/2020   MPG 105 06/22/2020   MPG 108 07/25/2019   No results found for: PROLACTIN Lab Results  Component Value Date   CHOL 148 06/22/2020   TRIG 111 06/22/2020   HDL 46 06/22/2020   CHOLHDL 3.2 06/22/2020   VLDL 22 06/22/2020   LDLCALC 80 06/22/2020   LDLCALC 80 07/25/2019    Physical Findings: AIMS:  , ,  ,  ,    CIWA:  CIWA-Ar Total: 1 COWS:     Musculoskeletal: Strength & Muscle Tone: within normal limits Gait & Station: normal Patient leans: N/A  Psychiatric Specialty Exam:  Presentation  General Appearance: Appropriate for Environment  Eye Contact:Good  Speech:Normal Rate  Speech Volume:Normal  Handedness:Right   Mood and Affect  Mood:Anxious  Affect:Appropriate   Thought Process  Thought Processes:Coherent  Descriptions of Associations:Intact  Orientation:Full (Time, Place and Person)  Thought Content:Logical  History of Schizophrenia/Schizoaffective disorder:No  Duration of Psychotic  Symptoms:Less than six months  Hallucinations:Hallucinations: Auditory  Ideas of Reference:None  Suicidal Thoughts:Suicidal Thoughts: No  Homicidal Thoughts:Homicidal Thoughts: No   Sensorium  Memory:Immediate Fair; Recent Fair; Remote Fair  Judgment:Fair  Insight:Fair   Executive Functions  Concentration:Fair  Attention Span:Good  Recall:Fair  Fund of Knowledge:Good  Language:Good   Psychomotor Activity  Psychomotor Activity:Psychomotor Activity: Normal   Assets  Assets:Desire for Improvement; Resilience; Social Support; Housing   Sleep  Sleep:Sleep: Good Number of Hours of Sleep: 6.25  Physical Exam: Physical Exam Vitals and nursing note reviewed.  Constitutional:      Appearance: Normal appearance.  HENT:     Head: Normocephalic and atraumatic.  Pulmonary:     Effort: Pulmonary effort is normal.  Neurological:     General: No focal deficit present.     Mental Status: She is alert and oriented to person, place, and time.    Review of Systems  All other systems reviewed and are negative.  Blood pressure (!) 99/54, pulse (!) 108, temperature 98.7 F (37.1 C), temperature source Oral, resp. rate 20, height 5\' 9"  (1.753 m), weight 97.5 kg, SpO2 97 %. Body mass index is 31.75 kg/m.   Treatment Plan Summary: Daily contact with patient to assess and evaluate symptoms and progress in treatment, Medication management and Plan : Patient is seen and examined.  Patient is a 40 year old female with the above-stated past psychiatric history who is seen in follow-up.   Diagnosis: 1.  Bipolar disorder type I, current episode manic, severe with psychotic features  Pertinent findings on examination today: 1.  Mood is slowly improving. 2.  Patient denied any auditory or visual hallucinations. 3.  Patient denied any suicidal ideation. 4.  Sleep is improved.  Plan: 1.  Continue Lexapro 5 mg p.o. daily for depression and anxiety. 2.  Continue Mucinex for  upper respiratory tract symptoms. 3.  Continue hydroxyzine 10 mg p.o. twice daily as needed anxiety. 4.  Continue Zyprexa 20 mg p.o. nightly for mood stability and psychosis. 5.  Continue trazodone 50 mg p.o. nightly as needed insomnia. 6.  Disposition planning-in progress.  20, MD 06/28/2020, 1:50 PM

## 2020-06-28 NOTE — [facility] Group Notes (Signed)
[facility] LCSW Group Therapy Note   Type of Therapy and Topic:  Group Therapy:  Feelings About Hospitalization  Participation Level:  Active   Description of Group This process group involved patients discussing their feelings related to being hospitalized, as well as the benefits they see to being in the hospital.  These feelings and benefits were itemized.  The group then brainstormed specific ways in which they could seek those same benefits when they discharge and return home.  Therapeutic Goals 1. Patient will identify and describe positive and negative feelings related to hospitalization 2. Patient will verbalize benefits of hospitalization to themselves personally 3. Patients will brainstorm together ways they can obtain similar benefits in the outpatient setting, identify barriers to wellness and possible solutions  Summary of Patient Progress:  [name] states that today she is happy because her medications are working and she feels more stable.  [name] accepted the worksheet that were provided and joined in discussion with her peers.   Therapeutic Modalities Cognitive Behavioral Therapy Motivational Interviewing

## 2020-06-28 NOTE — Progress Notes (Addendum)
   06/28/20 2100  Psych Admission Type (Psych Patients Only)  Admission Status Voluntary  Psychosocial Assessment  Patient Complaints Anxiety;Depression  Eye Contact Fair  Facial Expression Flat  Affect Anxious;Appropriate to circumstance  Speech Logical/coherent  Interaction Minimal  Motor Activity Slow  Appearance/Hygiene Unremarkable  Behavior Characteristics Cooperative;Appropriate to situation;Anxious  Mood Depressed;Anxious  Thought Process  Coherency Concrete thinking  Content WDL  Delusions None reported or observed  Perception WDL  Hallucination None reported or observed  Judgment Poor  Confusion None  Danger to Self  Current suicidal ideation? Denies  Danger to Others  Danger to Others None reported or observed   Pt seen in dayroom. Pt denies SI, HI, AVH. Pt states she has some pain in her lower back but it is manageable. Pt states that her sinus congestion and other allergy-like symptoms are better with the addition of Mucinex. Pt rates anxiety 3/10 and depression 4/10.

## 2020-06-28 NOTE — Progress Notes (Signed)
Adult Psychoeducational Group Note  Date:  06/28/2020 Time:  10:23 PM  Group Topic/Focus:  Wrap-Up Group:   The focus of this group is to help patients review their daily goal of treatment and discuss progress on daily workbooks.  Participation Level:  Active  Participation Quality:  Appropriate  Affect:  Appropriate  Cognitive:  Appropriate  Insight: Appropriate  Engagement in Group:  Supportive  Modes of Intervention:  Discussion  Additional Comments: The patient participated in AA Group on this date with volunteers on how to decrease dependence on relationships while beginning to meet own needs, build confidence, and practice assertiveness--discussed how to demonstrate healthy communication that is honest and self-disclosing. End of Wrap-Up progress report for the AA support group.             Nicoletta Dress 06/28/2020, 10:23 PM

## 2020-06-28 NOTE — Progress Notes (Signed)
Recreation Therapy Notes  Date:  6.3.22 Time: 0930 Location: [address] Dayroom  Group Topic: Stress Management  Goal Area(s) Addresses:  Patient will identify positive stress management techniques. Patient will identify benefits of using stress management post d/c.  Intervention: Stress Management   Activity: Meditation.  LRT played a meditation that focused on looking at each day as a new opportunity to start your day on a good note.    Education:  Stress Management, Discharge Planning.   Education Outcome: Acknowledges Education  Clinical Observations/Feedback: Pt did not attend group session.    Brittnee Gaetano, LRT/CTRS         David Towson A 06/28/2020 12:25 PM 

## 2020-06-28 NOTE — Progress Notes (Signed)
Pt did not attend group. 

## 2020-06-28 NOTE — Tx Team (Signed)
Interdisciplinary Treatment and Diagnostic Plan Update  06/28/2020 Time of Session: 9:50am FREDDI FORSTER MRN: [id number]  Principal Diagnosis: Bipolar I disorder, current or most recent episode manic, with psychotic features (HCC)  Secondary Diagnoses: Principal Problem:   Bipolar I disorder, current or most recent episode manic, with psychotic features (HCC)   Current Medications:  Current Facility-Administered Medications  Medication Dose Route Frequency Provider Last Rate Last Admin  . acetaminophen (TYLENOL) tablet 650 mg  650 mg Oral Q6H PRN Laveda Abbe, NP      . escitalopram (LEXAPRO) tablet 5 mg  5 mg Oral Daily Laveda Abbe, NP   5 mg at 06/28/20 0953  . guaiFENesin (MUCINEX) 12 hr tablet 600 mg  600 mg Oral BID Laveda Abbe, NP   600 mg at 06/28/20 3557  . hydrOXYzine (ATARAX/VISTARIL) tablet 10 mg  10 mg Oral BID Laveda Abbe, NP   10 mg at 06/28/20 0953  . magnesium hydroxide (MILK OF MAGNESIA) suspension 30 mL  30 mL Oral Daily PRN Leevy-Johnson, Brooke A, NP      . menthol-cetylpyridinium (CEPACOL) lozenge 3 mg  1 lozenge Oral PRN Jaclyn Shaggy, PA-C      . multivitamin with minerals tablet 1 tablet  1 tablet Oral Daily Laveda Abbe, NP   1 tablet at 06/28/20 [phone]  . nicotine polacrilex (NICORETTE) gum 2 mg  2 mg Oral PRN Dahlia Byes C, NP   2 mg at 06/28/20 0956  . OLANZapine zydis (ZYPREXA) disintegrating tablet 20 mg  20 mg Oral QHS Laveda Abbe, NP   20 mg at 06/27/20 2123  . OLANZapine zydis (ZYPREXA) disintegrating tablet 5 mg  5 mg Oral Q8H PRN Leevy-Johnson, Brooke A, NP       And  . ziprasidone (GEODON) injection 20 mg  20 mg Intramuscular PRN Leevy-Johnson, Brooke A, NP      . traZODone (DESYREL) tablet 50 mg  50 mg Oral QHS PRN Laveda Abbe, NP       PTA Medications: Medications Prior to Admission  Medication Sig Dispense Refill Last Dose  . benztropine (COGENTIN) 1 MG tablet Take 1 tablet  (1 mg total) by mouth 2 (two) times daily as needed for tremors. (Patient not taking: Reported on 06/22/2020) 30 tablet 0 Not Taking at Unknown time  . carbamazepine (TEGRETOL) 100 MG chewable tablet Chew 1 tablet (100 mg) and 2 tablets (200 mg) at bedtime. (Patient not taking: Reported on 06/22/2020) 90 tablet 0 Not Taking at Unknown time  . hydrOXYzine (ATARAX/VISTARIL) 25 MG tablet Take 1 tablet (25 mg total) by mouth 3 (three) times daily as needed for anxiety. (Patient not taking: Reported on 06/22/2020) 30 tablet 0 Not Taking at Unknown time  . [EXPIRED] metroNIDAZOLE (FLAGYL) 500 MG tablet Take 1 tablet (500 mg total) by mouth 2 (two) times daily for 5 days. (Patient not taking: Reported on 06/22/2020) 10 tablet 0 Not Taking at Unknown time  . risperiDONE (RISPERDAL M-TABS) 2 MG disintegrating tablet Take 1 tablet (2 mg total) by mouth daily. (Patient not taking: Reported on 06/22/2020) 30 tablet 0 Not Taking at Unknown time  . risperiDONE (RISPERDAL M-TABS) 4 MG disintegrating tablet Take 1 tablet (4 mg total) by mouth at bedtime. (Patient not taking: Reported on 06/22/2020) 30 tablet 0 Not Taking at Unknown time  . traZODone (DESYREL) 50 MG tablet Take 1 tablet (50 mg total) by mouth at bedtime as needed for sleep. (Patient not taking: Reported on 06/22/2020)  15 tablet 0 Not Taking at Unknown time    Patient Stressors:    Patient Strengths:    Treatment Modalities: Medication Management, Group therapy, Case management,  1 to 1 session with clinician, Psychoeducation, Recreational therapy.   Physician Treatment Plan for Primary Diagnosis: Bipolar I disorder, current or most recent episode manic, with psychotic features (HCC) Long Term Goal(s): Improvement in symptoms so as ready for discharge   Short Term Goals: Ability to identify changes in lifestyle to reduce recurrence of condition will improve Ability to verbalize feelings will improve Compliance with prescribed medications will  improve  Medication Management: Evaluate patient's response, side effects, and tolerance of medication regimen.  Therapeutic Interventions: 1 to 1 sessions, Unit Group sessions and Medication administration.  Evaluation of Outcomes: Progressing  Physician Treatment Plan for Secondary Diagnosis: Principal Problem:   Bipolar I disorder, current or most recent episode manic, with psychotic features (HCC)  Long Term Goal(s): Improvement in symptoms so as ready for discharge   Short Term Goals: Ability to identify changes in lifestyle to reduce recurrence of condition will improve Ability to verbalize feelings will improve Compliance with prescribed medications will improve     Medication Management: Evaluate patient's response, side effects, and tolerance of medication regimen.  Therapeutic Interventions: 1 to 1 sessions, Unit Group sessions and Medication administration.  Evaluation of Outcomes: Progressing   RN Treatment Plan for Primary Diagnosis: Bipolar I disorder, current or most recent episode manic, with psychotic features (HCC) Long Term Goal(s): Knowledge of disease and therapeutic regimen to maintain health will improve  Short Term Goals: Ability to remain free from injury will improve, Ability to verbalize frustration and anger appropriately will improve, Ability to demonstrate self-control, Ability to identify and develop effective coping behaviors will improve and Compliance with prescribed medications will improve  Medication Management: RN will administer medications as ordered by provider, will assess and evaluate patient's response and provide education to patient for prescribed medication. RN will report any adverse and/or side effects to prescribing provider.  Therapeutic Interventions: 1 on 1 counseling sessions, Psychoeducation, Medication administration, Evaluate responses to treatment, Monitor vital signs and CBGs as ordered, Perform/monitor CIWA, COWS, AIMS and  Fall Risk screenings as ordered, Perform wound care treatments as ordered.  Evaluation of Outcomes: Progressing   LCSW Treatment Plan for Primary Diagnosis: Bipolar I disorder, current or most recent episode manic, with psychotic features (HCC) Long Term Goal(s): Safe transition to appropriate next level of care at discharge, Engage patient in therapeutic group addressing interpersonal concerns.  Short Term Goals: Engage patient in aftercare planning with referrals and resources, Increase social support, Increase ability to appropriately verbalize feelings, Identify triggers associated with mental health/substance abuse issues and Increase skills for wellness and recovery  Therapeutic Interventions: Assess for all discharge needs, 1 to 1 time with Social worker, Explore available resources and support systems, Assess for adequacy in community support network, Educate family and significant other(s) on suicide prevention, Complete Psychosocial Assessment, Interpersonal group therapy.  Evaluation of Outcomes: Progressing   Progress in Treatment: Attending groups: Yes. Participating in groups: No. Taking medication as prescribed: Yes. Toleration medication: Yes. Family/Significant other contact made: No, will contact:  declined consents Patient understands diagnosis: Yes. Discussing patient identified problems/goals with staff: Yes. Medical problems stabilized or resolved: Yes. Denies suicidal/homicidal ideation: Yes. Issues/concerns per patient self-inventory: No.   New problem(s) identified: No, Describe:  none  New Short Term/Long Term Goal(s): medication stabilization, elimination of SI thoughts, development of comprehensive mental wellness plan.  Patient Goals:  Did not attend.  Discharge Plan or Barriers:  Patient recently admitted. CSW will continue to follow and assess for appropriate referrals and possible discharge planning.    Reason for Continuation of  Hospitalization: Delusions  Medication stabilization  Estimated Length of Stay: 3-5 days  Attendees: Patient: Did not attend 06/24/2020   Physician:  06/24/2020   Nursing:  06/24/2020   RN Care Manager: 06/24/2020   Social Worker: Ruthann Cancer, LCSW 06/24/2020   Recreational Therapist:  06/24/2020   Other:  06/24/2020   Other:  06/24/2020  Other: 06/24/2020       Scribe for Treatment Team: Felizardo Hoffmann, LCSWA 06/28/2020 11:48 AM

## 2020-06-29 LAB — GLUCOSE, CAPILLARY: Glucose-Capillary: 97 mg/dL (ref 70–99)

## 2020-06-29 NOTE — Progress Notes (Addendum)
[facility] MD Progress Note  06/29/2020 10:00 AM [name]  MRN:  [id number] Subjective: Patient is a 40 year old female with a past psychiatric history significant for bipolar disorder, most recently manic who presented to the behavioral health hospital voluntarily for treatment of bipolar disorder 1, manic, with psychotic features.  Objective: TODAY'S NOTE: Report received from Nursing staff, records reviewed and patient was met in the office for daily evaluation.  Patient was admitted a week ago for Manic symptoms and not taking medications.  Patient participated willingly in our discussion.  She admitted that she has denied that she does not suffer from Bipolar disorder for a while.  She also stated that coming to the hospital this time has improved his insight in her illness and taht taking medications is the key to remaining healthy.  Patient reports mood improvement, better sleep and good appetite.  Plan is for possible discharge tomorrow but she sis not aware of any discharge plans in terms of out patient follow and how to get medications as she does not have insurance.  Both patient and this provider will will contact discharge planner.  She denied feeling suicidal or having suicidal thoughts.  Patient also denied AVH and no mention of Paranoia.  We discussed the need for low Carb diet and low fat diet in reference to Metabolic Syndrome associated with Atypical antipsychotic Olanzapine.  Patient was advised to eat heakthy and exercise. Principal Problem: Bipolar I disorder, current or most recent episode manic, with psychotic features ([place]) Diagnosis: Principal Problem:   Bipolar I disorder, current or most recent episode manic, with psychotic features ([place])  Total Time spent with patient: 20 minutes  Past Psychiatric History: See admission H&P  Past Medical History:  Past Medical History:  Diagnosis Date  . ADD 10/22/2009  . ALLERGIC RHINITIS 09/26/2008  . Anemia   . ANXIETY 09/26/2008  .  Asthma   . Complication of anesthesia    "tempermental" per patient, "aggiated and hostile"  . CONSTIPATION 09/26/2008  . DEPRESSION 09/26/2008  . FATIGUE 01/14/2009  . Head injury with loss of consciousness ([place])   . Headache   . HYPERLIPIDEMIA 09/26/2008  . IBS (irritable bowel syndrome)   . OTITIS MEDIA, LEFT 01/14/2009  . SKIN LESION 09/26/2008  . Sleep apnea   . TONSILLITIS, ACUTE 01/14/2009  . Unconscious state ([place]) 10/13/2017  . URI 10/22/2009    Past Surgical History:  Procedure Laterality Date  . CERVICAL POLYPECTOMY  2009  . CERVICAL POLYPECTOMY  2019  . DILATATION & CURETTAGE/HYSTEROSCOPY WITH MYOSURE N/A 11/17/2017   Procedure: DILATATION & CURETTAGE/HYSTEROSCOPY WITH MYOSURE;  Surgeon: Thurnell Lose, MD;  Location: [facility];  Service: Gynecology;  Laterality: N/A;  . ORIF ANKLE FRACTURE Right 08/24/2018   Procedure: OPEN REDUCTION INTERNAL FIXATION TRIMALLEOLAR ANKLE FRACTURE;  Surgeon: Marybelle Killings, MD;  Location: [facility];  Service: Orthopedics;  Laterality: Right;   Family History:  Family History  Problem Relation Age of Onset  . Diabetes Other   . Hypertension Other   . Thyroid disease Mother   . Hypertension Mother    Family Psychiatric  History: See admission H&P Social History:  Social History   Substance and Sexual Activity  Alcohol Use Not Currently     Social History   Substance and Sexual Activity  Drug Use No    Social History   Socioeconomic History  . Marital status: Single    Spouse name: Not on file  . Number of children: Not on file  .  Years of education: Not on file  . Highest education level: Not on file  Occupational History  . Occupation: full time waitress  Tobacco Use  . Smoking status: Current Every Day Smoker    Packs/day: 1.00    Years: 20.00    Pack years: 20.00    Types: Cigarettes  . Smokeless tobacco: Never Used  Vaping Use  . Vaping Use: Former  Substance and Sexual Activity  . Alcohol use: Not Currently  . Drug use:  No  . Sexual activity: Not Currently    Birth control/protection: Abstinence  Other Topics Concern  . Not on file  Social History Narrative   Lives with mother   Social Determinants of Health   Financial Resource Strain: Not on file  Food Insecurity: Not on file  Transportation Needs: Not on file  Physical Activity: Not on file  Stress: Not on file  Social Connections: Not on file   Additional Social History:    Pain Medications: See MAR Prescriptions: See MAR Over the Counter: See MAR History of alcohol / drug use?: No history of alcohol / drug abuse Longest period of sobriety (when/how long): UTA Negative Consequences of Use: Personal relationships                    Sleep: Good  Appetite:  Good  Current Medications: Current Facility-Administered Medications  Medication Dose Route Frequency Provider Last Rate Last Admin  . acetaminophen (TYLENOL) tablet 650 mg  650 mg Oral Q6H PRN Ethelene Hal, NP      . escitalopram (LEXAPRO) tablet 5 mg  5 mg Oral Daily Ethelene Hal, NP   5 mg at 06/29/20 0904  . guaiFENesin (MUCINEX) 12 hr tablet 600 mg  600 mg Oral BID Ethelene Hal, NP   600 mg at 06/29/20 0904  . hydrOXYzine (ATARAX/VISTARIL) tablet 10 mg  10 mg Oral BID Ethelene Hal, NP   10 mg at 06/29/20 0904  . magnesium hydroxide (MILK OF MAGNESIA) suspension 30 mL  30 mL Oral Daily PRN Leevy-Johnson, Brooke A, NP      . menthol-cetylpyridinium (CEPACOL) lozenge 3 mg  1 lozenge Oral PRN Prescilla Sours, PA-C      . multivitamin with minerals tablet 1 tablet  1 tablet Oral Daily Ethelene Hal, NP   1 tablet at 06/29/20 0904  . nicotine polacrilex (NICORETTE) gum 2 mg  2 mg Oral PRN Charmaine Downs C, NP   2 mg at 06/28/20 1843  . OLANZapine zydis (ZYPREXA) disintegrating tablet 20 mg  20 mg Oral QHS Ethelene Hal, NP   20 mg at 06/28/20 2112  . OLANZapine zydis (ZYPREXA) disintegrating tablet 5 mg  5 mg Oral Q8H PRN  Leevy-Johnson, Brooke A, NP       And  . ziprasidone (GEODON) injection 20 mg  20 mg Intramuscular PRN Leevy-Johnson, Brooke A, NP      . traZODone (DESYREL) tablet 50 mg  50 mg Oral QHS PRN Ethelene Hal, NP        Lab Results:  Results for orders placed or performed during the hospital encounter of 06/21/20 (from the past 48 hour(s))  Glucose, capillary     Status: None   Collection Time: 06/29/20  5:57 AM  Result Value Ref Range   Glucose-Capillary 97 70 - 99 mg/dL    Comment: Glucose reference range applies only to samples taken after fasting for at least 8 hours.    Blood Alcohol  level:  Lab Results  Component Value Date   ETH <10 06/22/2020   ETH <10 36/46/8032    Metabolic Disorder Labs: Lab Results  Component Value Date   HGBA1C 5.3 06/22/2020   MPG 105 06/22/2020   MPG 108 07/25/2019   No results found for: PROLACTIN Lab Results  Component Value Date   CHOL 148 06/22/2020   TRIG 111 06/22/2020   HDL 46 06/22/2020   CHOLHDL 3.2 06/22/2020   VLDL 22 06/22/2020   LDLCALC 80 06/22/2020   LDLCALC 80 07/25/2019    Physical Findings: AIMS:  , ,  ,  ,    CIWA:  CIWA-Ar Total: 1 COWS:     Musculoskeletal: Strength & Muscle Tone: within normal limits Gait & Station: normal Patient leans: N/A  Psychiatric Specialty Exam:  Presentation  General Appearance: Appropriate for Environment  Eye Contact:Good  Speech:Normal Rate  Speech Volume:Normal  Handedness:Right   Mood and Affect  Mood:Anxious  Affect:Appropriate   Thought Process  Thought Processes:Coherent  Descriptions of Associations:Intact  Orientation:Full (Time, Place and Person)  Thought Content:Logical  History of Schizophrenia/Schizoaffective disorder:No  Duration of Psychotic Symptoms:Less than six months  Hallucinations:Hallucinations: Auditory  Ideas of Reference:None  Suicidal Thoughts:Suicidal Thoughts: No  Homicidal Thoughts:Homicidal Thoughts:  No   Sensorium  Memory:Immediate Fair; Recent Fair; Remote Fair  Judgment:Fair  Insight:Fair   Executive Functions  Concentration:Fair  Attention Span:Good  [place]  Language:Good   Psychomotor Activity  Psychomotor Activity:Psychomotor Activity: Normal   Assets  Assets:Desire for Improvement; Resilience; Social Support; Housing   Sleep  Sleep:Sleep: Good Number of Hours of Sleep: 6.25    Physical Exam: Physical Exam Vitals and nursing note reviewed.  Constitutional:      Appearance: Normal appearance.  HENT:     Head: Normocephalic and atraumatic.  Pulmonary:     Effort: Pulmonary effort is normal.  Neurological:     General: No focal deficit present.     Mental Status: She is alert and oriented to person, place, and time.    Review of Systems  All other systems reviewed and are negative.  Blood pressure (!) 101/53, pulse 67, temperature 98 F (36.7 C), temperature source Oral, resp. rate 20, height '5\' 9"'  (1.753 m), weight 97.5 kg, SpO2 96 %. Body mass index is 31.75 kg/m.   Treatment Plan Summary: Daily contact with patient to assess and evaluate symptoms and progress in treatment, Medication management and Plan : Patient is seen and examined.  Patient is a 40 year old female with the above-stated past psychiatric history who is seen in follow-up.   Diagnosis: 1.  Bipolar disorder type I, current episode manic, severe with psychotic features  Pertinent findings on examination today: 1.  Mood is slowly improving. 2.  Patient denied any auditory or visual hallucinations. 3.  Patient denied any suicidal ideation. 4.  Sleep is improved. Plan: 1.  Continue Lexapro 5 mg p.o. daily for depression and anxiety. 2.  Continue Mucinex for upper respiratory tract symptoms. 3.  Continue hydroxyzine 10 mg p.o. twice daily as needed anxiety. 4.  Continue Zyprexa 20 mg p.o. nightly for mood stability and psychosis. 5.  Continue  trazodone 50 mg p.o. nightly as needed insomnia. 6.  Disposition planning-in progress.  Delfin Gant, NP -PMHNP-BC 06/29/2020, 10:00 AM

## 2020-06-29 NOTE — [facility] Group Notes (Signed)
LCSW Group Therapy Note  06/29/2020    10:00-11:00am   Type of Therapy and Topic:  Group Therapy: Early Messages Received About Anger  Participation Level:  Did Not Attend   Description of Group:   In this group, patients shared and discussed the early messages received in their lives about anger through parental or other adult modeling, teaching, repression, punishment, violence, and more.  Participants identified how those childhood lessons influence even now how they usually or often react when angered.  The group discussed that anger is a secondary emotion and what may be the underlying emotional themes that come out through anger outbursts or that are ignored through anger suppression.    Therapeutic Goals: Patients will identify one or more childhood message about anger that they received and how it was taught to them. Patients will discuss how these childhood experiences have influenced and continue to influence their own expression or repression of anger even today. Patients will explore possible primary emotions that tend to fuel their secondary emotion of anger. Patients will learn that anger itself is normal and cannot be eliminated, and that healthier coping skills can assist with resolving conflict rather than worsening situations.  Summary of Patient Progress:  Did not attend  Therapeutic Modalities:   Cognitive Behavioral Therapy Motivation Interviewing  Zylie Mumaw J Grossman-Orr  .  

## 2020-06-29 NOTE — Progress Notes (Signed)
   06/29/20 0621  Vital Signs  Temp 98 F (36.7 C)  Temp Source Oral  Pulse Rate 97  BP 121/82  BP Location Left Arm  BP Method Automatic  Patient Position (if appropriate) Sitting  Oxygen Therapy  SpO2 96 %   D: Patient denies SI/HI/AVH. Patient complained of some lower back pain but refused any additional medicine. Patient rated both anxiety and depression 2/10, and refused additional medicine for anxiety.  A:  Patient took scheduled medicine.  Support and encouragement provided Routine safety checks conducted every 15 minutes. Patient  Informed to notify staff with any concerns.  R:  Safety maintained.

## 2020-06-29 NOTE — Progress Notes (Signed)
   06/29/20 2100  Psych Admission Type (Psych Patients Only)  Admission Status Voluntary  Psychosocial Assessment  Patient Complaints Depression  Eye Contact Fair  Facial Expression Flat  Affect Anxious;Appropriate to circumstance  Speech Logical/coherent  Interaction Minimal  Motor Activity Slow  Appearance/Hygiene Unremarkable  Behavior Characteristics Cooperative;Appropriate to situation  Mood Depressed  Thought Process  Coherency Concrete thinking  Content WDL  Delusions None reported or observed  Perception WDL  Hallucination None reported or observed  Judgment Poor  Confusion None  Danger to Self  Current suicidal ideation? Denies  Danger to Others  Danger to Others None reported or observed   Pt with minimal interaction today. Had nosebleed probably from the Mucinex. Cleared up. Denies SI, HI, AVH. Pain 3/10 in lower back but refused pain medication. Denies anxiety but rates depression 2/10. Feels meds are helping her.

## 2020-06-30 NOTE — Progress Notes (Signed)
The patient's positive event for the day is that she met some new peers. Her goal for tomorrow is to get discharged.

## 2020-06-30 NOTE — Progress Notes (Signed)
   06/30/20 2300  Psych Admission Type (Psych Patients Only)  Admission Status Voluntary  Psychosocial Assessment  Patient Complaints Anxiety  Eye Contact Fair  Facial Expression Flat  Affect Anxious;Appropriate to circumstance  Speech Logical/coherent  Interaction Minimal  Motor Activity Slow  Appearance/Hygiene Unremarkable  Behavior Characteristics Cooperative;Anxious  Mood Anxious;Depressed  Thought Process  Coherency Concrete thinking  Content WDL  Delusions None reported or observed  Perception WDL  Hallucination None reported or observed  Judgment Poor  Confusion None  Danger to Self  Current suicidal ideation? Denies  Danger to Others  Danger to Others None reported or observed  D: Patient in dayroom reports she had a good day. Pt reports she is tolerating medication well. A: Medications administered as prescribed. Support and encouragement provided as needed.  R: Patient remains safe on the unit. Will continue to monitor for safety and stability.

## 2020-06-30 NOTE — [facility] Group Notes (Signed)
Adult Psychoeducational Group Note  Date:  06/30/2020 Time:  10:36 AM  Group Topic/Focus:  Goals Group:   The focus of this group is to help patients establish daily goals to achieve during treatment and discuss how the patient can incorporate goal setting into their daily lives to aide in recovery.  Participation Level:  Active  Participation Quality:  Appropriate  Affect:  Appropriate  Cognitive:  Appropriate  Insight: Appropriate  Engagement in Group:  Engaged  Modes of Intervention:  Education  Additional Comments:  Pt goal today is to stay aware.Pt has no feelings of wanting to hurt herself or others.  Sherrey North, Sharen Counter 06/30/2020, 10:36 AM

## 2020-06-30 NOTE — Plan of Care (Addendum)
Nurse discussed coping skills with patient.  

## 2020-06-30 NOTE — Progress Notes (Signed)
D:  Patient's self inventory sheet, patient sleeps good, no sleep medication.  Good appetite, normal energy level, good concentration.  Rated depression, hopeless and anxiety 2.  Denied withdrawals.  Denied SI.  Physical problems, lightheaded, pain, lower back.  Worst pain #2 in past 24 hours. Goal is discharge plan.  Be aware.  Does have discharge plans. A:  Medications administered per MD orders.  Emotional support and encouragement given patient. R:  Denied SI and HI, contracts for safety.  Denied A/V hallucinations.  Safety maintained with 15 minute checks.

## 2020-06-30 NOTE — Progress Notes (Signed)
[facility] MD Progress Note  06/30/2020 2:50 PM [name]  MRN:  [id number] Subjective: " I am glad I came in to seek treatment.  I have better insight in mental illness" Reason for Hospitalization: Patient is a 40 year old female with a past psychiatric history significant for bipolar disorder, most recently manic who presented to the behavioral health hospital voluntarily for treatment of bipolar disorder 1, manic, with psychotic features.  Objective: TODAY'S NOTE: Report received from Nursing staff, records reviewed and patient was met in the office for daily evaluation.  Patient continues to express happiness for coming to to the hospital for treatment.  Patient reported good and improved sleep and appetite.  She is willingly participating in group activities.   She rated depression 2/10 with 10 being severe depression.  Patient is interested in outpatient Psychiatrist and therapy when discharged.  She denies SI/HI/AVH and no mention of paranoia.  Plan is for discharge this week. Principal Problem: Bipolar I disorder, current or most recent episode manic, with psychotic features ([place]) Diagnosis: Principal Problem:   Bipolar I disorder, current or most recent episode manic, with psychotic features ([place])  Total Time spent with patient: 20 minutes  Past Psychiatric History: See admission H&P  Past Medical History:  Past Medical History:  Diagnosis Date  . ADD 10/22/2009  . ALLERGIC RHINITIS 09/26/2008  . Anemia   . ANXIETY 09/26/2008  . Asthma   . Complication of anesthesia    "tempermental" per patient, "aggiated and hostile"  . CONSTIPATION 09/26/2008  . DEPRESSION 09/26/2008  . FATIGUE 01/14/2009  . Head injury with loss of consciousness ([place])   . Headache   . HYPERLIPIDEMIA 09/26/2008  . IBS (irritable bowel syndrome)   . OTITIS MEDIA, LEFT 01/14/2009  . SKIN LESION 09/26/2008  . Sleep apnea   . TONSILLITIS, ACUTE 01/14/2009  . Unconscious state ([place]) 10/13/2017  . URI 10/22/2009    Past  Surgical History:  Procedure Laterality Date  . CERVICAL POLYPECTOMY  2009  . CERVICAL POLYPECTOMY  2019  . DILATATION & CURETTAGE/HYSTEROSCOPY WITH MYOSURE N/A 11/17/2017   Procedure: DILATATION & CURETTAGE/HYSTEROSCOPY WITH MYOSURE;  Surgeon: Thurnell Lose, MD;  Location: [facility];  Service: Gynecology;  Laterality: N/A;  . ORIF ANKLE FRACTURE Right 08/24/2018   Procedure: OPEN REDUCTION INTERNAL FIXATION TRIMALLEOLAR ANKLE FRACTURE;  Surgeon: Marybelle Killings, MD;  Location: [facility];  Service: Orthopedics;  Laterality: Right;   Family History:  Family History  Problem Relation Age of Onset  . Diabetes Other   . Hypertension Other   . Thyroid disease Mother   . Hypertension Mother    Family Psychiatric  History: See admission H&P Social History:  Social History   Substance and Sexual Activity  Alcohol Use Not Currently     Social History   Substance and Sexual Activity  Drug Use No    Social History   Socioeconomic History  . Marital status: Single    Spouse name: Not on file  . Number of children: Not on file  . Years of education: Not on file  . Highest education level: Not on file  Occupational History  . Occupation: full time waitress  Tobacco Use  . Smoking status: Current Every Day Smoker    Packs/day: 1.00    Years: 20.00    Pack years: 20.00    Types: Cigarettes  . Smokeless tobacco: Never Used  Vaping Use  . Vaping Use: Former  Substance and Sexual Activity  . Alcohol use: Not Currently  .  Drug use: No  . Sexual activity: Not Currently    Birth control/protection: Abstinence  Other Topics Concern  . Not on file  Social History Narrative   Lives with mother   Social Determinants of Health   Financial Resource Strain: Not on file  Food Insecurity: Not on file  Transportation Needs: Not on file  Physical Activity: Not on file  Stress: Not on file  Social Connections: Not on file   Additional Social History:    Pain Medications: See  MAR Prescriptions: See MAR Over the Counter: See MAR History of alcohol / drug use?: No history of alcohol / drug abuse Longest period of sobriety (when/how long): UTA Negative Consequences of Use: Personal relationships                    Sleep: Good  Appetite:  Good  Current Medications: Current Facility-Administered Medications  Medication Dose Route Frequency Provider Last Rate Last Admin  . acetaminophen (TYLENOL) tablet 650 mg  650 mg Oral Q6H PRN Ethelene Hal, NP      . escitalopram (LEXAPRO) tablet 5 mg  5 mg Oral Daily Ethelene Hal, NP   5 mg at 06/30/20 0900  . guaiFENesin (MUCINEX) 12 hr tablet 600 mg  600 mg Oral BID Ethelene Hal, NP   600 mg at 06/30/20 0900  . hydrOXYzine (ATARAX/VISTARIL) tablet 10 mg  10 mg Oral BID Ethelene Hal, NP   10 mg at 06/30/20 0900  . magnesium hydroxide (MILK OF MAGNESIA) suspension 30 mL  30 mL Oral Daily PRN Leevy-Johnson, Brooke A, NP      . menthol-cetylpyridinium (CEPACOL) lozenge 3 mg  1 lozenge Oral PRN Prescilla Sours, PA-C      . multivitamin with minerals tablet 1 tablet  1 tablet Oral Daily Ethelene Hal, NP   1 tablet at 06/30/20 0900  . nicotine polacrilex (NICORETTE) gum 2 mg  2 mg Oral PRN Charmaine Downs C, NP   2 mg at 06/30/20 1310  . OLANZapine zydis (ZYPREXA) disintegrating tablet 20 mg  20 mg Oral QHS Ethelene Hal, NP   20 mg at 06/29/20 2110  . OLANZapine zydis (ZYPREXA) disintegrating tablet 5 mg  5 mg Oral Q8H PRN Leevy-Johnson, Brooke A, NP       And  . ziprasidone (GEODON) injection 20 mg  20 mg Intramuscular PRN Leevy-Johnson, Brooke A, NP      . traZODone (DESYREL) tablet 50 mg  50 mg Oral QHS PRN Ethelene Hal, NP        Lab Results:  Results for orders placed or performed during the hospital encounter of 06/21/20 (from the past 48 hour(s))  Glucose, capillary     Status: None   Collection Time: 06/29/20  5:57 AM  Result Value Ref Range    Glucose-Capillary 97 70 - 99 mg/dL    Comment: Glucose reference range applies only to samples taken after fasting for at least 8 hours.    Blood Alcohol level:  Lab Results  Component Value Date   ETH <10 06/22/2020   ETH <10 95/62/1308    Metabolic Disorder Labs: Lab Results  Component Value Date   HGBA1C 5.3 06/22/2020   MPG 105 06/22/2020   MPG 108 07/25/2019   No results found for: PROLACTIN Lab Results  Component Value Date   CHOL 148 06/22/2020   TRIG 111 06/22/2020   HDL 46 06/22/2020   CHOLHDL 3.2 06/22/2020   VLDL 22  06/22/2020   [place] 80 06/22/2020   LDLCALC 80 07/25/2019    Physical Findings: AIMS:  , ,  ,  ,    CIWA:  CIWA-Ar Total: 1 COWS:     Musculoskeletal: Strength & Muscle Tone: within normal limits Gait & Station: normal Patient leans: N/A  Psychiatric Specialty Exam: Psychiatric Specialty Exam: Physical Exam Vitals and nursing note reviewed.  Constitutional:      Appearance: Normal appearance.  HENT:     Head: Normocephalic and atraumatic.  Pulmonary:     Effort: Pulmonary effort is normal.  Neurological:     General: No focal deficit present.     Mental Status: She is alert and oriented to person, place, and time.     Review of Systems  All other systems reviewed and are negative.   Blood pressure (!) 116/51, pulse (!) 108, temperature (!) 97.5 F (36.4 C), temperature source Oral, resp. rate 20, height _0  (1.753 m), weight 97.5 kg, SpO2 99 %.Body mass index is 31.75 kg/m.  General Appearance: Well Groomed  Eye Contact:  Good  Speech:  Clear and Coherent  Volume:  Normal  Mood:  less depressed  Affect:  Congruent  Thought Process:  Coherent and Goal Directed  Orientation:  Full (Time, Place, and Person)  Thought Content:  Logical  Suicidal Thoughts:  No  Homicidal Thoughts:  No  Memory:  Immediate;   Good Recent;   Good Remote;   Good  Judgement:  Good  Insight:  Good  Psychomotor Activity:  Normal   Concentration:  Concentration: Good and Attention Span: Good  Recall:  Good  Fund of Knowledge:  Good  Language:  Good  Akathisia:  No  Handed:  Right  AIMS (if indicated):     Assets:  Communication Skills Desire for Improvement Housing Leisure Time Physical Health  ADL's:  Intact  Cognition:  WNL  Sleep:  Number of Hours: 6.75    Presentation  General Appearance: Appropriate for Environment  Eye Contact:Good  Speech:Normal Rate  Speech Volume:Normal  Handedness:Right   Mood and Affect  Mood:Anxious  Affect:Appropriate   Thought Process  Thought Processes:Coherent  Descriptions of Associations:Intact  Orientation:Full (Time, Place and Person)  Thought Content:Logical  History of Schizophrenia/Schizoaffective disorder:No  Duration of Psychotic Symptoms:Less than six months  Hallucinations:No data recorded  Ideas of Reference:None  Suicidal Thoughts:No data recorded  Homicidal Thoughts:No data recorded   Sensorium  Memory:Immediate Fair; Recent Fair; Remote Fair  Judgment:Fair  Insight:Fair   Executive Functions  Concentration:Fair  Attention Span:Good  [place] of Knowledge:Good  Language:Good   Psychomotor Activity  Psychomotor Activity:No data recorded   Assets  Assets:Desire for Improvement; Resilience; Social Support; Housing   Sleep  Sleep:No data recorded    Physical Exam: Physical Exam Vitals and nursing note reviewed.  Constitutional:      Appearance: Normal appearance.  HENT:     Head: Normocephalic and atraumatic.  Pulmonary:     Effort: Pulmonary effort is normal.  Neurological:     General: No focal deficit present.     Mental Status: She is alert and oriented to person, place, and time.    Review of Systems  All other systems reviewed and are negative.  Blood pressure (!) 116/51, pulse (!) 108, temperature (!) 97.5 F (36.4 C), temperature source Oral, resp. rate 20, height _1  (1.753  m), weight 97.5 kg, SpO2 99 %. Body mass index is 31.75 kg/m.   Treatment Plan Summary: Daily contact with  patient to assess and evaluate symptoms and progress in treatment, Medication management and Plan : Patient is seen and examined.  Patient is a 40 year old female with the above-stated past psychiatric history who is seen in follow-up.   Diagnosis: 1.  Bipolar disorder type I, current episode manic, severe with psychotic features  Pertinent findings on examination today: 1.  Mood continues to improve per patient. 2.  Patient denied any auditory or visual hallucinations. 3.  Patient denied any suicidal ideation. 4.  Sleep is improved. Plan: 1.  Continue Lexapro 5 mg p.o. daily for depression and anxiety. 2.  Continue Mucinex for upper respiratory tract symptoms. 3.  Continue hydroxyzine 10 mg p.o. twice daily as needed anxiety. 4.  Continue Zyprexa 20 mg p.o. nightly for mood stability and psychosis. 5.  Continue trazodone 50 mg p.o. nightly as needed insomnia. 6.  Disposition planning-in progress.  Delfin Gant, NP -PMHNP-BC 06/30/2020, 2:50 PM

## 2020-06-30 NOTE — [facility] Group Notes (Signed)
[facility] LCSW Group Therapy Note  06/30/2020    Type of Therapy and Topic:  Group Therapy:  A Hero Worthy of Support  Participation Level:  Active   Description of Group:  Patients in this group were introduced to the concept that additional supports including self-support are an essential part of recovery.  Matching needs with supports to help fulfill those needs was explained.  Establishing boundaries that can gradually be increased or decreased was described, with patients giving their own examples of establishing appropriate boundaries in their lives.  A song entitled "My Own Hero" was played and a group discussion ensued in which patients stated it inspired them to help themselves in order to succeed, because other people cannot achieve their goals such as sobriety or stability for them.  A song was played called "I Am Enough" which led to a discussion about being willing to believe we are worth the effort of being a self-support.   Therapeutic Goals: 1)  demonstrate the importance of being a key part of one's own support system 2)  discuss various available supports 3)  encourage patient to use music as part of their self-support and focus on goals 4)  elicit ideas from patients about supports that need to be added   Summary of Patient Progress:  The patient expressed that a support she would like to add at hospital discharge is therapist.  She talked about how the music affected her positively and made her cry.  She displayed some improvement in her insight and was less irritable.  Therapeutic Modalities:   Motivational Interviewing Activity  Lynnell Chad

## 2020-07-01 MED ORDER — OLANZAPINE 20 MG PO TBDP: 20.0000 mg | ORAL_TABLET | Freq: Every day | ORAL | 0 refills | Status: DC

## 2020-07-01 MED ORDER — TRAZODONE HCL 50 MG PO TABS: 50.0000 mg | ORAL_TABLET | Freq: Every evening | ORAL | 0 refills | Status: DC | PRN

## 2020-07-01 MED ORDER — ESCITALOPRAM OXALATE 5 MG PO TABS: 5.0000 mg | ORAL_TABLET | Freq: Every day | ORAL | 0 refills | Status: DC

## 2020-07-01 MED ORDER — HYDROXYZINE HCL 10 MG PO TABS: 10.0000 mg | ORAL_TABLET | Freq: Two times a day (BID) | ORAL | 0 refills | Status: DC

## 2020-07-01 NOTE — Progress Notes (Signed)
D:  Patient's self inventory sheet, patient has fair sleep, sleep medication not helpful.  Good appetite, normal energy level, good concentration.  Rated depression, hopeless and anxiety #1.  Denied withdrawals.  Denied SI.  Denied physical problems.  Denied physical pain.  Goal is discharge.  Plans to stay focused.  Does have discharge plans. A:  Medications administered per MD orders.  Emotional support and encouragement given patient. R:  Denied SI and HI, contracts for safety.  Denied A/V hallucinations.  Safety maintained with 15 minute checks.

## 2020-07-01 NOTE — Plan of Care (Signed)
Nurse discussed coping skills with patient.  

## 2020-07-01 NOTE — [facility] Suicide Risk Assessment (Signed)
[facility] Discharge Suicide Risk Assessment   Principal Problem: Bipolar I disorder, current or most recent episode manic, with psychotic features [facility]) Discharge Diagnoses: Principal Problem:   Bipolar I disorder, current or most recent episode manic, with psychotic features ([facility])   Total Time spent with patient: 20 minutes  Musculoskeletal: Strength & Muscle Tone: within normal limits Gait & Station: normal Patient leans: N/A  Psychiatric Specialty Exam  Presentation  General Appearance: Appropriate for Environment  Eye Contact:Good  Speech:Normal Rate  Speech Volume:Normal  Handedness:Right   Mood and Affect  Mood:Anxious  Duration of Depression Symptoms: No data recorded Affect:Appropriate   Thought Process  Thought Processes:Coherent  Descriptions of Associations:Intact  Orientation:Full (Time, Place and Person)  Thought Content:Logical  History of Schizophrenia/Schizoaffective disorder:No  Duration of Psychotic Symptoms:Less than six months  Hallucinations:No data recorded Ideas of Reference:None  Suicidal Thoughts:No data recorded Homicidal Thoughts:No data recorded  Sensorium  Memory:Immediate Fair; Recent Fair; Remote Fair  Judgment:Fair  Insight:Fair   Executive Functions  Concentration:Fair  Attention Span:Good  Recall:Fair  Fund of Knowledge:Good  Language:Good   Psychomotor Activity  Psychomotor Activity:No data recorded  Assets  Assets:Desire for Improvement; Resilience; Social Support; Housing   Sleep  Sleep:No data recorded  Physical Exam: Physical Exam Vitals and nursing note reviewed.  Constitutional:      Appearance: Normal appearance.  HENT:     Head: Normocephalic and atraumatic.  Pulmonary:     Effort: Pulmonary effort is normal.  Neurological:     General: No focal deficit present.     Mental Status: She is alert and oriented to person, place, and time.    Review of Systems  All other systems reviewed  and are negative.  Blood pressure (!) 107/42, pulse (!) 121, temperature 97.6 F (36.4 C), temperature source Oral, resp. rate 16, height 5\' 9"  (1.753 m), weight 97.5 kg, SpO2 100 %. Body mass index is 31.75 kg/m.  Mental Status Per Nursing Assessment::   On Admission:  NA  Demographic Factors:  Caucasian, Low socioeconomic status and Unemployed  Loss Factors: NA  Historical Factors: Impulsivity  Risk Reduction Factors:   Living with another person, especially a relative and Positive social support  Continued Clinical Symptoms:  Bipolar Disorder:   Mixed State  Cognitive Features That Contribute To Risk:  None    Suicide Risk:  Minimal: No identifiable suicidal ideation.  Patients presenting with no risk factors but with morbid ruminations; may be classified as minimal risk based on the severity of the depressive symptoms   Follow-up Information    [facility] Follow up on 07/03/2020.   Specialty: Behavioral Health Why: You have an appointment on Monday 07/03/20 at 7:45 am for therapy services. This is a walk-in appointment and will be held in person.  Contact information: [address] [place] [phone]       [facility] Follow up on 08/09/2020.   Why: You have an appointment on 08/09/2020 at 2:30pm to follow up about your thyroid labs.   Contact information: [address] [place] [phone]              Plan Of Care/Follow-up recommendations:  Activity:  ad lib  [phone], MD 07/01/2020, 8:00 AM

## 2020-07-01 NOTE — Progress Notes (Signed)
Recreation Therapy Notes  Date:  6.6.22 Time: 0930 Location: [address] Dayroom  Group Topic: Stress Management  Goal Area(s) Addresses:  Patient will identify positive stress management techniques. Patient will identify benefits of using stress management post d/c.  Intervention: Stress Management  Activity: Meditation.  LRT played a meditation that focused on setting boundaries and that it's ok to say no for your own self care even if it's not what others want.    Education:  Stress Management, Discharge Planning.   Education Outcome: Acknowledges Education  Clinical Observations/Feedback: Pt did not attend group session.     Caroll Rancher, LRT/CTRS         Caroll Rancher A 07/01/2020 12:25 PM

## 2020-07-01 NOTE — [facility] Group Notes (Signed)
Spiritual care group on grief and loss facilitated by chaplain Janetta Hora Lomax, BCC, LCAS-A  Group Goal: Support / Education around grief and loss   Members engage in facilitated group support and psycho-social education.   Group Description:   Following introductions and group rules, group members engaged in facilitated group dialogue and support around topic of loss, with particular support around experiences of loss in their lives. Group Identified types of loss (relationships / self / things) and identified their own healthy and unhealthy coping patternss as well as childhood messages received about grief. Reflected on thoughts / feelings around loss, normalized grief responses, and recognized variety in grief experience.   Group drew on Adlerian / Rogerian, narrative, MI,   Level of Engagement/Patient Progress: Pt linked her story to others, named music and hobbies as healthy coping strategies and supported other patients. Patient was alert and oriented x 4 and was an active participant in group.   Maryanna Shape. Carley Hammed, M.Div. [facility] Chaplain Pager [phone] Office [phone]
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
## 2020-07-24 ENCOUNTER — Ambulatory Visit (INDEPENDENT_AMBULATORY_CARE_PROVIDER_SITE_OTHER): Payer: No Payment, Other | Admitting: Clinical

## 2020-07-24 ENCOUNTER — Other Ambulatory Visit: Payer: Self-pay

## 2020-07-24 DIAGNOSIS — F319 Bipolar disorder, unspecified: Secondary | ICD-10-CM

## 2020-07-26 ENCOUNTER — Other Ambulatory Visit: Payer: Self-pay

## 2020-07-26 ENCOUNTER — Ambulatory Visit (HOSPITAL_COMMUNITY): Payer: No Payment, Other | Admitting: Psychiatry

## 2020-07-28 ENCOUNTER — Other Ambulatory Visit (HOSPITAL_COMMUNITY): Payer: Self-pay | Admitting: Psychiatry

## 2020-07-28 NOTE — Progress Notes (Signed)
   THERAPIST PROGRESS NOTE  Session Time: 30 minutes  Participation Level: Active  Behavioral Response: CasualAlertEuthymic  Type of Therapy: Individual Therapy  Treatment Goals addressed: Coping  Interventions: CBT  Summary:  [name] is a 40 y.o. female who presents by as a hospital discharge from [facility] for ongoing outpatient behavioral health services.  Client reported she voluntarily presented to [facility] on Jun 21, 2020 due to symptoms of paranoia, confusion, racing thoughts, increased energy, and insomnia.  Client reported prior hospitalization over the past 2 years due to similar symptoms.  Client reported she was previously diagnosed with bipolar disorder at [facility].  Client reported following prior hospitalization she did not follow through on the recommendation to continue medication management and outpatient therapy services.  Client reported she was diagnosed with ADHD about 10 years ago but did not notice any other significant signs of mental health illness until approximately 2019.  Client stated quotation I can be fine for months and then have an episode of insomnia, racing thoughts and not making decisions wisely".  Client reported since discharge from the hospital she has been medication compliant and has noticed improvement evidenced by quotation mark calming down the racing thoughts" and depressed mood has lessened.  Client reported however she continues to struggle with concentrating, getting things done, and some anxiety.  Client reported she is sleeping through the night and has a good appetite.  Client reported she does think that the medication has increased her hunger.  Client reported running out of her hydroxyzine last night on 07/23/2020 and is unsure of why it ran out before her psychiatry appointment on 07/26/2020.  Client reported she has good support from her family.  GAD 7 : Generalized Anxiety Score 07/24/2020   Nervous, Anxious, on Edge 3  Control/stop worrying 3  Worry too much - different things 3  Trouble relaxing 1  Restless 1  Easily annoyed or irritable 1  Afraid - awful might happen 2  Total GAD 7 Score 14  Anxiety Difficulty Extremely difficult   Flowsheet Row Counselor from 07/24/2020 in [facility]  PHQ-9 Total Score 16         Suicidal/Homicidal: Nowithout intent/plan  Therapist Response:  Therapist began the session by making introductions and discussing the confidentiality clause. Therapist used CBT to engage with the client by asking open-ended questions about her presenting causes and assessment gathered at [facility]. Therapist used CBT to engage with the client and ask open-ended questions about prior mental health history and diagnosis. Therapist used CBT to ask the client open-ended questions about medication compliant since discharge from the hospital.   Therapist used CBT to ask the client open-ended questions about persisting symptoms she endorses since taking prescribed medications. Therapist completed SDOH with clients input. Therapist discussed follow-up treatment recommendations and inform the client of emergency/crisis services. Therapist addressed questions and concerns.     Plan: Client was scheduled for follow up outpatient therapy with Richardson Dopp on 08/22/2020. Client was scheduled for psychiatry on 07/26/2020.   Diagnosis: Bipolar 1 disorder   Neena Rhymes Ninette Cotta, LCSW 07/24/2020

## 2020-07-29 NOTE — Telephone Encounter (Signed)
Patient should see her outpatient doctor for refills

## 2020-08-09 ENCOUNTER — Inpatient Hospital Stay: Payer: Self-pay | Admitting: Internal Medicine

## 2020-08-22 ENCOUNTER — Other Ambulatory Visit: Payer: Self-pay

## 2020-08-22 ENCOUNTER — Ambulatory Visit (HOSPITAL_COMMUNITY): Payer: No Payment, Other | Admitting: Licensed Clinical Social Worker

## 2020-08-22 ENCOUNTER — Telehealth (HOSPITAL_COMMUNITY): Payer: Self-pay | Admitting: Licensed Clinical Social Worker

## 2020-08-22 NOTE — Telephone Encounter (Signed)
LCSW sent two links to pt phone at 1500  and 1505 with no response. LCSW f/u with PC with no answer. LCSW disconnected PC and sent another text with no response at 1515 LCSW disconnected

## 2020-09-03 ENCOUNTER — Telehealth (HOSPITAL_COMMUNITY): Payer: Self-pay | Admitting: *Deleted

## 2020-09-03 NOTE — Telephone Encounter (Signed)
Call from patients mom, stating she needs medicine till she is seen on Monday the 15 th with a provider. She was recently in  the hospital, last rxs were written by the [facility] and she has never been seen here by a provider. Explained to mom the walk in process, she states her daughter has tried to come in as a walk in but never been able to be seen. Reiterated the walk in process as the only opportunity she has to see a provider before her Monday, first time patient appt.

## 2020-09-04 ENCOUNTER — Other Ambulatory Visit: Payer: Self-pay

## 2020-09-04 ENCOUNTER — Ambulatory Visit (INDEPENDENT_AMBULATORY_CARE_PROVIDER_SITE_OTHER): Payer: No Payment, Other | Admitting: Licensed Clinical Social Worker

## 2020-09-04 ENCOUNTER — Ambulatory Visit (INDEPENDENT_AMBULATORY_CARE_PROVIDER_SITE_OTHER): Payer: No Payment, Other | Admitting: Psychiatry

## 2020-09-04 ENCOUNTER — Encounter (HOSPITAL_COMMUNITY): Payer: Self-pay | Admitting: Psychiatry

## 2020-09-04 VITALS — BP 123/62 | HR 95 | Ht 69.0 in | Wt 262.0 lb

## 2020-09-04 DIAGNOSIS — F17200 Nicotine dependence, unspecified, uncomplicated: Secondary | ICD-10-CM | POA: Diagnosis not present

## 2020-09-04 DIAGNOSIS — F319 Bipolar disorder, unspecified: Secondary | ICD-10-CM

## 2020-09-04 DIAGNOSIS — F411 Generalized anxiety disorder: Secondary | ICD-10-CM | POA: Diagnosis not present

## 2020-09-04 MED ORDER — ESCITALOPRAM OXALATE 5 MG PO TABS
5.0000 mg | ORAL_TABLET | Freq: Every day | ORAL | 2 refills | Status: DC
  Filled 2020-09-04: qty 30, 30d supply, fill #0
  Filled 2020-10-07: qty 30, 30d supply, fill #1
  Filled 2020-11-14: qty 30, 30d supply, fill #2

## 2020-09-04 MED ORDER — TRAZODONE HCL 50 MG PO TABS
50.0000 mg | ORAL_TABLET | Freq: Every evening | ORAL | 2 refills | Status: DC | PRN
  Filled 2020-09-04: qty 30, 30d supply, fill #0
  Filled 2020-10-07: qty 30, 30d supply, fill #1
  Filled 2020-11-14: qty 30, 30d supply, fill #2

## 2020-09-04 MED ORDER — NICOTINE POLACRILEX 2 MG MT GUM
2.0000 mg | CHEWING_GUM | OROMUCOSAL | 2 refills | Status: DC | PRN
  Filled 2020-09-04: qty 100, fill #0

## 2020-09-04 MED ORDER — HYDROXYZINE HCL 10 MG PO TABS
10.0000 mg | ORAL_TABLET | Freq: Three times a day (TID) | ORAL | 2 refills | Status: DC | PRN
  Filled 2020-09-04: qty 90, 30d supply, fill #0
  Filled 2020-10-07: qty 90, 30d supply, fill #1
  Filled 2020-11-14: qty 90, 30d supply, fill #2

## 2020-09-04 MED ORDER — OLANZAPINE 20 MG PO TBDP
20.0000 mg | ORAL_TABLET | Freq: Every day | ORAL | 2 refills | Status: DC
  Filled 2020-09-04: qty 30, 30d supply, fill #0
  Filled 2020-10-07: qty 30, 30d supply, fill #1
  Filled 2020-11-14: qty 30, 30d supply, fill #2

## 2020-09-04 NOTE — Progress Notes (Signed)
Psychiatric Initial Adult Assessment   Patient Identification: [name] MRN:  [id number] Date of Evaluation:  09/04/2020 Referral Source: [facility] Chief Complaint:  "I am feeling angry" Chief Complaint   Medication Management    Visit Diagnosis:    ICD-10-CM   1. Bipolar 1 disorder (HCC)  F31.9 escitalopram (LEXAPRO) 5 MG tablet    OLANZapine zydis (ZYPREXA) 20 MG disintegrating tablet    traZODone (DESYREL) 50 MG tablet    2. Generalized anxiety disorder  F41.1 escitalopram (LEXAPRO) 5 MG tablet    hydrOXYzine (ATARAX/VISTARIL) 10 MG tablet    3. Tobacco dependence  F17.200 nicotine polacrilex (NICORETTE) 2 MG gum      History of Present Illness:  40 year old female seen today for initial psychiatric evaluation. She was recently seen at [facility] on 5/27 through 07/01/20, presented voluntarily because she was having increased symptoms of mania.   She has psychiatric history of depression, ADD, bipolar 1 disorder, and anxiety. She is currently managed on Lexapro 5 mg daily, hydroxyzine 10mg  BID, Zyprexa 20mg  nightly, and trazodone 50mg  nightly as needed. She notes her medications were effective in managing her psychiatric conditions; however, she ran out of her prescriptions and has not been on medication.    Today, she is well groomed, cooperative, engaged in conversation, but anxious, constricted, and restless. She admits to bilateral aching leg pain which she has not tried to relieve with medication, at a pain scale 6/10. She also smokes 1 pack per day of tobacco cigarettes. She has consumed alcohol 2-3 times this week which she reports has been an attempt to control her symptoms.   She informed provider that since her hospitalization, she feels a little grumpy, distractable, irritable and is having racing thoughts. Her mood has been irregular. She reports that music and exercise have helped her mood. She endorses problems spending too much money recently. She has not been on her medications  and is focused on getting her medications refilled today. She states she worries about everything and is having trouble focusing during this visit.   Today, provider conducted a GAD-7 and patient scored a 21. Provider also conducted a PHQ-9 and patient scored a 18. She endorses sleeping 3-4 hours per night. She endorses poor appetite and is losing weight; however, patient reports weight loss is a good thing. She denies SI, HI, VAH, mania, or paranoia.    Patient is agreeable to increasing hydroxyzine 10mg  BID to 10mg  TID as needed. She is agreeable to continuing her current medication dosages of Lexapro 5 mg daily, Zyprexa 20mg  nightly, and trazodone 50mg  nightly as needed. Patient is open to trying Nicotine gum 2 mg for smoking cessation trial. She will follow up with outpatient counseling for therapy. No other concerns noted at this time.   Associated Signs/Symptoms: Depression Symptoms:  depressed mood, anhedonia, insomnia, psychomotor agitation, fatigue, feelings of worthlessness/guilt, difficulty concentrating, hopelessness, anxiety, loss of energy/fatigue, disturbed sleep, weight loss, decreased appetite, (Hypo) Manic Symptoms:  Distractibility, Elevated Mood, Flight of Ideas, , Impulsivity, Irritable Mood, Anxiety Symptoms:  Excessive Worry, Psychotic Symptoms:   Denies PTSD Symptoms: NA  Past Psychiatric History: Depression, ADD, bipolar 1, and anxiety  Previous Psychotropic Medications:  Zyprexa, hydroxyzine, trazodone, Lexapro  Substance Abuse History in the last 12 months:  No.  Consequences of Substance Abuse: NA  Past Medical History:  Past Medical History:  Diagnosis Date   ADD 10/22/2009   ALLERGIC RHINITIS 09/26/2008   Anemia    ANXIETY 09/26/2008   Asthma  Complication of anesthesia    "tempermental" per patient, "aggiated and hostile"   CONSTIPATION 09/26/2008   DEPRESSION 09/26/2008   FATIGUE 01/14/2009   Head injury with loss of  consciousness (HCC)    Headache    HYPERLIPIDEMIA 09/26/2008   IBS (irritable bowel syndrome)    OTITIS MEDIA, LEFT 01/14/2009   SKIN LESION 09/26/2008   Sleep apnea    TONSILLITIS, ACUTE 01/14/2009   Unconscious state (HCC) 10/13/2017   URI 10/22/2009    Past Surgical History:  Procedure Laterality Date   CERVICAL POLYPECTOMY  2009   CERVICAL POLYPECTOMY  2019   DILATATION & CURETTAGE/HYSTEROSCOPY WITH MYOSURE N/A 11/17/2017   Procedure: DILATATION & CURETTAGE/HYSTEROSCOPY WITH MYOSURE;  Surgeon: Geryl RankinsVarnado, Evelyn, MD;  Location: [facility];  Service: Gynecology;  Laterality: N/A;   ORIF ANKLE FRACTURE Right 08/24/2018   Procedure: OPEN REDUCTION INTERNAL FIXATION TRIMALLEOLAR ANKLE FRACTURE;  Surgeon: Eldred MangesYates, Mark C, MD;  Location: [facility] OR;  Service: Orthopedics;  Laterality: Right;    Family Psychiatric History: Brother depression and mother anxiety  Family History:  Family History  Problem Relation Age of Onset   Diabetes Other    Hypertension Other    Thyroid disease Mother    Hypertension Mother     Social History:   Social History   Socioeconomic History   Marital status: Single    Spouse name: Not on file   Number of children: Not on file   Years of education: Not on file   Highest education level: Not on file  Occupational History   Occupation: full time waitress  Tobacco Use   Smoking status: Every Day    Packs/day: 1.00    Years: 20.00    Pack years: 20.00    Types: Cigarettes   Smokeless tobacco: Never  Vaping Use   Vaping Use: Former  Substance and Sexual Activity   Alcohol use: Not Currently   Drug use: No   Sexual activity: Not Currently    Birth control/protection: Abstinence  Other Topics Concern   Not on file  Social History Narrative   Lives with mother   Social Determinants of Health   Financial Resource Strain: Not on file  Food Insecurity: Not on file  Transportation Needs: Not on file  Physical Activity: Not on file  Stress: Not on file   Social Connections: Not on file    Additional Social History: Patient [place] in [place]. Patient lives with her parents and is dependent on them financially due to her psychiatric history.She is single and has no children. She is unemployed. She notes she smoke a pack of cigarettes a day. She informed Clinical research associatewriter that since running out of her medications she has been self-medicating with alcohol noting that she drinks 2-3 alcoholic beverages over the course of a few days.  Allergies:  No Known Allergies  Metabolic Disorder Labs: Lab Results  Component Value Date   HGBA1C 5.3 06/22/2020   MPG 105 06/22/2020   MPG 108 07/25/2019   No results found for: PROLACTIN Lab Results  Component Value Date   CHOL 148 06/22/2020   TRIG 111 06/22/2020   HDL 46 06/22/2020   CHOLHDL 3.2 06/22/2020   VLDL 22 06/22/2020   LDLCALC 80 06/22/2020   LDLCALC 80 07/25/2019   Lab Results  Component Value Date   TSH 4.263 06/22/2020    Therapeutic Level Labs: No results found for: LITHIUM Lab Results  Component Value Date   CBMZ 6.8 07/29/2019   No results found for: VALPROATE  Current Medications: Current Outpatient Medications  Medication Sig Dispense Refill   nicotine polacrilex (NICORETTE) 2 MG gum Take 1 each (2 mg total) by mouth as needed for smoking cessation. 100 tablet 2   escitalopram (LEXAPRO) 5 MG tablet Take 1 tablet (5 mg total) by mouth daily. 30 tablet 2   hydrOXYzine (ATARAX/VISTARIL) 10 MG tablet Take 1 tablet (10 mg total) by mouth 3 (three) times daily as needed. 90 tablet 2   OLANZapine zydis (ZYPREXA) 20 MG disintegrating tablet Take 1 tablet (20 mg total) by mouth at bedtime. 30 tablet 2   traZODone (DESYREL) 50 MG tablet Take 1 tablet (50 mg total) by mouth at bedtime as needed for sleep. 30 tablet 2   No current facility-administered medications for this visit.    Musculoskeletal: Strength & Muscle Tone: within normal limits Gait & Station: normal Patient leans:  N/A  Psychiatric Specialty Exam: Review of Systems  Blood pressure 123/62, pulse 95, height 5\' 9"  (1.753 m), weight 262 lb (118.8 kg), SpO2 98 %.Body mass index is 38.69 kg/m.  General Appearance: Well Groomed  Eye Contact:  Good  Speech:  Clear and Coherent and Normal Rate  Volume:  Normal  Mood:  Anxious, Depressed, and Irritable  Affect:  Constricted and Flat  Thought Process:  Coherent, Goal Directed, and Linear  Orientation:  Full (Time, Place, and Person)  Thought Content:  WDL and Logical  Suicidal Thoughts:  No  Homicidal Thoughts:  No  Memory:  Immediate;   Good Recent;   Good Remote;   Good  Judgement:  Fair  Insight:  Fair  Psychomotor Activity:  Normal  Concentration:  Concentration: Good and Attention Span: Good  Recall:  Good  Fund of Knowledge:Good  Language: Good  Akathisia:  No  Handed:  Right  AIMS (if indicated):  not done  Assets:  Communication Skills Desire for Improvement Housing Leisure Time Physical Health Social Support  ADL's:  Intact  Cognition: WNL  Sleep:  Good   Screenings: AIMS    Flowsheet Row [facility] ([facility] Ad[facility] ([facility]) [facility]  AIMS Total Score 0 0 0 0      AUDIT    Flowsheet Row Admission (Discharged) from [facility] Ad[facility] (Dis[facility]) [facility] Ad[facility] (Dis[facility] Ad[facility] (Dis[facility]  Alcohol Use Disorder Identification Test Final Score (AUDIT) 0 0 4 0      GAD-7     Flowsheet Row Clinical Support from 09/04/2020 in [facility] Counselor from 07/24/2020 in [facility]  Total GAD-7 Score 21 14      PHQ2-9    Flowsheet Row Clinical Support from 09/04/2020 in [facility] Counselor from 07/24/2020 in [facility] Office Visit from 03/11/2017 in [facility]  PHQ-2 Total Score 1 4 0  PHQ-9 Total Score 18 16 --      Flowsheet Row Clinical Support from 09/04/2020 in [facility] Counselor from 07/24/2020 in [facility] Admission (Discharged) from [facility]  C-SSRS RISK CATEGORY No Risk No Risk Moderate Risk       Assessment and Plan: Patient endorses symptoms of anxiety, depression, and hypomania since being off her medications.  He is agreeable to increasing hydroxyzine 10 mg twice daily to hydroxyzine 10 mg 3 times daily.  He is also agreeable to starting Nicorette 2 mg gum to help manage smoking cessation. She will restart all other medications without adjustments.  1. Bipolar 1 disorder (HCC)  Restart- escitalopram (LEXAPRO) 5 MG tablet; Take 1 tablet (5 mg total) by mouth daily.  Dispense: 30 tablet; Refill: 2 Restart- OLANZapine zydis (ZYPREXA) 20 MG disintegrating tablet; Take 1 tablet (20 mg total) by mouth at bedtime.  Dispense: 30 tablet; Refill: 2 Restart- traZODone (DESYREL) 50 MG tablet; Take 1 tablet (50 mg total) by mouth at bedtime as needed for sleep.  Dispense: 30 tablet; Refill: 2  2. Generalized anxiety disorder  Restart- escitalopram (LEXAPRO) 5 MG tablet; Take 1 tablet (5 mg total) by mouth daily.  Dispense: 30 tablet; Refill: 2 Increased- hydrOXYzine (ATARAX/VISTARIL) 10 MG tablet; Take 1 tablet (10 mg total) by mouth 3 (three) times daily as needed.  Dispense: 90 tablet; Refill:  2  3. Tobacco dependence  Start- nicotine polacrilex (NICORETTE) 2 MG gum; Take 1 each (2 mg total) by mouth as needed for smoking cessation.  Dispense: 100 tablet; Refill: 2    Shanna Cisco, NP 8/10/20222:53 PM

## 2020-09-04 NOTE — Progress Notes (Signed)
   THERAPIST PROGRESS NOTE  Session Time: 51  Participation Level: Active  Behavioral Response: CasualAlertAnxious and Depressed  Type of Therapy: Individual Therapy  Treatment Goals addressed: Anxiety and Diagnosis: Depression and anxiety   Interventions: Solution Focused  Summary: [name] is a 40 y.o. female who presents with anxious, depressed, restless mood\affect.  Patient was alert and oriented x5.  She was dressed casually and maintained good eye contact.  Patient was pleasant and cooperative in session.  LCSW spoke with patient about no-show policy and cancellation within 24 hours as patient has an extensive history with no-shows and cancellations.  [name] reports that she has an extensive history at behavioral [facility] over the past 2 years, she reports that normally she goes on her medication for about 1 month until she runs out and then at that time does not follow-up for refill.  Patient was recently discharged 60 days ago from behavioral health hospital she reports that she stabilized on medications listed in discharge summary and then forgot about an appointment and could not get refills.  Over the past 30 days patient reports and endorses an increase in anxiety and depression for tension, worry, restlessness, fatigue, and fidgeting.  Primary support system for patient is her mother who she currently lives with.  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  Suicidal/Homicidal: NAwithout intent/plan  Therapist Response:    Intervention/Plan: Primary interventions utilized in today's therapy was solution focused.  LCSW provided patient resources to reset MyChart to help with reminders for appointments such as medication management or therapy.  LCSW also confirmed all medications that were on discharge summary for [facility].  LCSW coordinated those medications with medication provider which patient will be seeing at 2 PM  today.  Patient in today's session verbalize agreement further cancellation within 24-hour and no-show policy, which is if patient does cancel or no-show appointments moving forward get canceled and patient is only provided walk-in appointments.  LCSW utilized open-ended questions.  LCSW utilized open and understanding tone in session.  Plan for patient is to stabilize back on medications and then follow-up with LCSW in 4 weeks.  Plan: Return again in 4 weeks.     Weber Cooks, LCSW 09/04/2020

## 2020-09-05 ENCOUNTER — Other Ambulatory Visit: Payer: Self-pay

## 2020-09-09 ENCOUNTER — Ambulatory Visit (HOSPITAL_COMMUNITY): Payer: No Payment, Other

## 2020-09-10 ENCOUNTER — Inpatient Hospital Stay: Payer: Self-pay | Admitting: Internal Medicine

## 2020-09-11 ENCOUNTER — Other Ambulatory Visit: Payer: Self-pay

## 2020-09-12 ENCOUNTER — Other Ambulatory Visit: Payer: Self-pay

## 2020-10-02 ENCOUNTER — Ambulatory Visit (INDEPENDENT_AMBULATORY_CARE_PROVIDER_SITE_OTHER): Payer: No Payment, Other | Admitting: Licensed Clinical Social Worker

## 2020-10-02 ENCOUNTER — Other Ambulatory Visit: Payer: Self-pay

## 2020-10-02 DIAGNOSIS — F319 Bipolar disorder, unspecified: Secondary | ICD-10-CM | POA: Diagnosis not present

## 2020-10-02 DIAGNOSIS — F900 Attention-deficit hyperactivity disorder, predominantly inattentive type: Secondary | ICD-10-CM

## 2020-10-02 NOTE — Progress Notes (Signed)
   THERAPIST PROGRESS NOTE  Session Time: 32   Participation Level: Active  Behavioral Response: CasualAlertAnxious  Type of Therapy: Individual Therapy  Treatment Goals addressed: Coping  Interventions: Motivational Interviewing, Solution Focused, and Reframing  Summary: [name] is a 40 y.o. female who presents with anxious and flat mood\affect.  Patient was cooperative, pleasant, and maintained good eye contact.  [name] was alert and oriented x5.  Patient engaged well in therapy session.  Patient reports "everything has been a lot better since I started my medication".  Patient reported stressors for financial, illness, and family conflict.  Patient reports that she wanted to discuss Social Security disability with LCSW.  LCSW printed out resources for patient and provided advised the patient on how to apply for Social Security disability.  Patient was notified that documentation release forms would be needed to release any records to [employer] and that the process can take anywhere from 6 to 12 months before an approval or denial is made.   Suicidal/Homicidal: Nowithout intent/plan  Therapist Response:    Intervention/Plan: Patient was also provided resources for mental health for employment.  Solution focused therapy was utilized today by providing patient resources for [employer] works and [employer].  [employer] works provides job Engineer, maintenance (IT) and connect people with employment.  [employer] connects people with mental health illnesses to work\employment.  LCSW advised patient to maintain part-time hours increase gradually to maintain current mental health.  LCSW administered PHQ-9 previous score was a 18 current score is a 14.  LCSW administered GAD-7 previous score was a 21 current score is 12.  [name] was concerned with increase in her ADHD symptoms for lack of concentration, poor follow-through, and losing things.  LCSW advised patient to maintain current medication  regiment and reach out to medication provider to see if other medications could be obtained for ADHD.  Patient to follow-up with LCSW in 4 weeks  Plan: Return again in 4 weeks.     Weber Cooks, LCSW 10/02/2020

## 2020-10-07 ENCOUNTER — Other Ambulatory Visit: Payer: Self-pay

## 2020-10-08 ENCOUNTER — Other Ambulatory Visit: Payer: Self-pay

## 2020-10-15 ENCOUNTER — Other Ambulatory Visit: Payer: Self-pay

## 2020-10-28 ENCOUNTER — Inpatient Hospital Stay: Payer: Self-pay | Admitting: Internal Medicine

## 2020-10-30 ENCOUNTER — Ambulatory Visit (HOSPITAL_COMMUNITY): Payer: Self-pay | Admitting: Licensed Clinical Social Worker

## 2020-11-14 ENCOUNTER — Other Ambulatory Visit: Payer: Self-pay

## 2020-11-18 ENCOUNTER — Other Ambulatory Visit: Payer: Self-pay

## 2020-11-26 ENCOUNTER — Inpatient Hospital Stay: Payer: Self-pay | Admitting: Internal Medicine

## 2020-12-03 ENCOUNTER — Ambulatory Visit (HOSPITAL_COMMUNITY): Payer: No Payment, Other | Admitting: Licensed Clinical Social Worker

## 2020-12-05 ENCOUNTER — Other Ambulatory Visit: Payer: Self-pay

## 2020-12-05 ENCOUNTER — Telehealth (INDEPENDENT_AMBULATORY_CARE_PROVIDER_SITE_OTHER): Payer: No Payment, Other | Admitting: Psychiatry

## 2020-12-05 ENCOUNTER — Encounter (HOSPITAL_COMMUNITY): Payer: Self-pay | Admitting: Psychiatry

## 2020-12-05 DIAGNOSIS — F900 Attention-deficit hyperactivity disorder, predominantly inattentive type: Secondary | ICD-10-CM

## 2020-12-05 DIAGNOSIS — F17200 Nicotine dependence, unspecified, uncomplicated: Secondary | ICD-10-CM | POA: Diagnosis not present

## 2020-12-05 DIAGNOSIS — F319 Bipolar disorder, unspecified: Secondary | ICD-10-CM | POA: Diagnosis not present

## 2020-12-05 DIAGNOSIS — F411 Generalized anxiety disorder: Secondary | ICD-10-CM | POA: Diagnosis not present

## 2020-12-05 MED ORDER — HYDROXYZINE HCL 10 MG PO TABS
10.0000 mg | ORAL_TABLET | Freq: Three times a day (TID) | ORAL | 3 refills | Status: DC | PRN
  Filled 2020-12-05 – 2020-12-23 (×2): qty 90, 30d supply, fill #0

## 2020-12-05 MED ORDER — ATOMOXETINE HCL 40 MG PO CAPS
40.0000 mg | ORAL_CAPSULE | Freq: Every day | ORAL | 3 refills | Status: DC
  Filled 2020-12-05: qty 30, 30d supply, fill #0
  Filled 2021-02-05: qty 30, 30d supply, fill #1
  Filled 2021-02-05: qty 30, 30d supply, fill #0

## 2020-12-05 MED ORDER — NICOTINE POLACRILEX 2 MG MT GUM
2.0000 mg | CHEWING_GUM | OROMUCOSAL | 3 refills | Status: DC | PRN
  Filled 2020-12-05: qty 100, fill #0

## 2020-12-05 MED ORDER — TRAZODONE HCL 50 MG PO TABS
50.0000 mg | ORAL_TABLET | Freq: Every evening | ORAL | 3 refills | Status: DC | PRN
  Filled 2020-12-05 – 2020-12-23 (×2): qty 30, 30d supply, fill #0

## 2020-12-05 MED ORDER — OLANZAPINE 20 MG PO TBDP
20.0000 mg | ORAL_TABLET | Freq: Every day | ORAL | 3 refills | Status: DC
  Filled 2020-12-05 – 2020-12-23 (×2): qty 30, 30d supply, fill #0
  Filled 2021-02-05: qty 90, 90d supply, fill #0
  Filled 2021-02-05: qty 30, 30d supply, fill #1

## 2020-12-05 MED ORDER — ESCITALOPRAM OXALATE 5 MG PO TABS
5.0000 mg | ORAL_TABLET | Freq: Every day | ORAL | 3 refills | Status: DC
  Filled 2020-12-05 – 2021-02-05 (×3): qty 30, 30d supply, fill #0
  Filled 2021-02-05: qty 30, 30d supply, fill #1

## 2020-12-05 NOTE — Progress Notes (Signed)
BH MD/PA/NP OP Progress Note Virtual Visit via Telephone Note  I connected with [name] on 12/05/20 at  3:00 PM EST by telephone and verified that I am speaking with the correct person using two identifiers.  Location: Patient: home Provider: Clinic   I discussed the limitations, risks, security and privacy concerns of performing an evaluation and management service by telephone and the availability of in person appointments. I also discussed with the patient that there may be a patient responsible charge related to this service. The patient expressed understanding and agreed to proceed.   I provided 30 minutes of non-face-to-face time during this encounter.  12/05/2020 3:08 PM [name]  MRN:  [id number]  Chief Complaint: "The mediations work great and my mood is stabilizing but I need something for ADD"  HPI: 40 year old female seen today for follow up psychiatric evaluation.    She has psychiatric history of depression, ADD, bipolar 1 disorder, and anxiety. She is currently managed on Lexapro 5 mg daily, hydroxyzine 10mg  three time daily, Zyprexa 20mg  nightly, and trazodone 50mg  nightly as needed. She notes her medications are effective in managing her psychiatric conditions.     Today she was unable to login virtually so her assessment was done over the phone. During exam she was pleasant, cooperative, and engaged in conversation. She informed that he medications are working great. She notes her mood is stable but reports that she continues to have issues with ADD. Patient notes that she has poor concentration, is forgetful, has poor listening skills, and is inattentive    Patient informed writer that her anxiety and depression has improved since her last visit. Today, provider conducted a GAD-7 and patient scored an 8, at her last visit she scored a  21. Provider also conducted a PHQ-9 and patient scored a 15, at her last visit she scored an 18. She endorses adequate  sleep noting that she has been sleeping 8 hours instead of sleeping 3-4 hours per night. She also reports that her appetite is adequate. She informed that she has been cutting back on carbs and sugar and has lost some weight.  E   Today she is agreeable to start Strattera 40 mg to help manage symptoms of ADD. She will continue all other medications as prescribed and will follow up with outpatient counseling for therapy. No other concerns noted at this time.    Visit Diagnosis:    ICD-10-CM   1. Attention deficit hyperactivity disorder (ADHD), predominantly inattentive type  F90.0 atomoxetine (STRATTERA) 40 MG capsule    2. Bipolar 1 disorder (HCC)  F31.9 escitalopram (LEXAPRO) 5 MG tablet    OLANZapine zydis (ZYPREXA) 20 MG disintegrating tablet    traZODone (DESYREL) 50 MG tablet    3. Generalized anxiety disorder  F41.1 escitalopram (LEXAPRO) 5 MG tablet    hydrOXYzine (ATARAX/VISTARIL) 10 MG tablet    4. Tobacco dependence  F17.200 nicotine polacrilex (NICORETTE) 2 MG gum      Past Psychiatric History:  depression, ADD, bipolar 1 disorder, and anxiety  Past Medical History:  Past Medical History:  Diagnosis Date   ADD 10/22/2009   ALLERGIC RHINITIS 09/26/2008   Anemia    ANXIETY 09/26/2008   Asthma    Complication of anesthesia    "tempermental" per patient, "aggiated and hostile"   CONSTIPATION 09/26/2008   DEPRESSION 09/26/2008   FATIGUE 01/14/2009   Head injury with loss of consciousness (HCC)    Headache    HYPERLIPIDEMIA 09/26/2008  IBS (irritable bowel syndrome)    OTITIS MEDIA, LEFT 01/14/2009   SKIN LESION 09/26/2008   Sleep apnea    TONSILLITIS, ACUTE 01/14/2009   Unconscious state (HCC) 10/13/2017   URI 10/22/2009    Past Surgical History:  Procedure Laterality Date   CERVICAL POLYPECTOMY  2009   CERVICAL POLYPECTOMY  2019   DILATATION & CURETTAGE/HYSTEROSCOPY WITH MYOSURE N/A 11/17/2017   Procedure: DILATATION & CURETTAGE/HYSTEROSCOPY WITH MYOSURE;  Surgeon:  Geryl Rankins, MD;  Location: [facility];  Service: Gynecology;  Laterality: N/A;   ORIF ANKLE FRACTURE Right 08/24/2018   Procedure: OPEN REDUCTION INTERNAL FIXATION TRIMALLEOLAR ANKLE FRACTURE;  Surgeon: Eldred Manges, MD;  Location: [facility];  Service: Orthopedics;  Laterality: Right;    Family Psychiatric History: Brother depression and mother anxiety  Family History:  Family History  Problem Relation Age of Onset   Diabetes Other    Hypertension Other    Thyroid disease Mother    Hypertension Mother     Social History:  Social History   Socioeconomic History   Marital status: Single    Spouse name: Not on file   Number of children: Not on file   Years of education: Not on file   Highest education level: Not on file  Occupational History   Occupation: full time waitress  Tobacco Use   Smoking status: Every Day    Packs/day: 1.00    Years: 20.00    Pack years: 20.00    Types: Cigarettes   Smokeless tobacco: Never  Vaping Use   Vaping Use: Former  Substance and Sexual Activity   Alcohol use: Not Currently   Drug use: No   Sexual activity: Not Currently    Birth control/protection: Abstinence  Other Topics Concern   Not on file  Social History Narrative   Lives with mother   Social Determinants of Health   Financial Resource Strain: Not on file  Food Insecurity: Not on file  Transportation Needs: Not on file  Physical Activity: Not on file  Stress: Not on file  Social Connections: Not on file    Allergies: No Known Allergies  Metabolic Disorder Labs: Lab Results  Component Value Date   HGBA1C 5.3 06/22/2020   MPG 105 06/22/2020   MPG 108 07/25/2019   No results found for: PROLACTIN Lab Results  Component Value Date   CHOL 148 06/22/2020   TRIG 111 06/22/2020   HDL 46 06/22/2020   CHOLHDL 3.2 06/22/2020   VLDL 22 06/22/2020   LDLCALC 80 06/22/2020   LDLCALC 80 07/25/2019   Lab Results  Component Value Date   TSH 4.263 06/22/2020   TSH 3.023  07/25/2019    Therapeutic Level Labs: No results found for: LITHIUM No results found for: VALPROATE No components found for:  CBMZ  Current Medications: Current Outpatient Medications  Medication Sig Dispense Refill   atomoxetine (STRATTERA) 40 MG capsule Take 1 capsule (40 mg total) by mouth daily. 30 capsule 3   escitalopram (LEXAPRO) 5 MG tablet Take 1 tablet (5 mg total) by mouth daily. 30 tablet 3   hydrOXYzine (ATARAX/VISTARIL) 10 MG tablet Take 1 tablet (10 mg total) by mouth 3 (three) times daily as needed. 90 tablet 3   nicotine polacrilex (NICORETTE) 2 MG gum Take 1 each (2 mg total) by mouth as needed for smoking cessation. 100 tablet 3   OLANZapine zydis (ZYPREXA) 20 MG disintegrating tablet Take 1 tablet (20 mg total) by mouth at bedtime. 30 tablet 3  traZODone (DESYREL) 50 MG tablet Take 1 tablet (50 mg total) by mouth at bedtime as needed for sleep. 30 tablet 3   No current facility-administered medications for this visit.     Musculoskeletal: Strength & Muscle Tone:  Unable to assess due to telephone visit Gait & Station:  Unable to assess due to telephone visit Patient leans: N/A  Psychiatric Specialty Exam: Review of Systems  There were no vitals taken for this visit.There is no height or weight on file to calculate BMI.  General Appearance:  Unable to assess due to telephone visi  Eye Contact:   Unable to assess due to telephone visi  Speech:  Clear and Coherent and Normal Rate  Volume:  Normal  Mood:  Euthymic  Affect:  Appropriate and Congruent  Thought Process:  Coherent, Goal Directed, and Linear  Orientation:  Full (Time, Place, and Person)  Thought Content: WDL and Logical   Suicidal Thoughts:  No  Homicidal Thoughts:  No  Memory:  Immediate;   Good Recent;   Good Remote;   Good  Judgement:  Good  Insight:  Good  Psychomotor Activity:   Unable to assess due to telephone visit  Concentration:  Concentration: Fair and Attention Span: Fair   Recall:  Good  Fund of Knowledge: Good  Language: Good  Akathisia:   Unable to assess due to telephone visit  Handed:  Right  AIMS (if indicated): not done  Assets:  Communication Skills Desire for Improvement Financial Resources/Insurance Housing Intimacy Physical Health Social Support  ADL's:  Intact  Cognition: WNL  Sleep:  Good   Screenings: AIMS    Flowsheet Row [facility] (Dis[facility] Ad[facility] (Dis[facility] Ad[facility] (Dis[facility]) [facility]  AIMS Total Score 0 0 0 0      AUDIT    Flowsheet Row Admission (Discharged) from [facility] Ad[facility] (Dis[facility]) [facility] Admission (Discharged) [facility] Ad[facility] (Dis[facility]  Alcohol Use Disorder Identification Test Final Score (AUDIT) 0 0 4 0      GAD-7    Flowsheet Row Video Visit from 12/05/2020 in [facility] Counselor from 10/02/2020 in [facility] Clinical Support from 09/04/2020 in [facility] Counselor from 07/24/2020 in [facility]  Total GAD-7 Score 8 12 21 14       PHQ2-9    Flowsheet Row Video Visit from 12/05/2020 in [facility] Counselor from 10/02/2020 in [facility] Clinical Support from 09/04/2020 in [facility] Counselor from 07/24/2020 in [facility] Office Visit from 03/11/2017 in [facility]  PHQ-2 Total Score 5 3 1 4  0  PHQ-9 Total Score 15 13 18 16  --      Flowsheet Row Clinical Support from 09/04/2020 in [facility] Counselor from 07/24/2020 in [facility] Admission (Discharged) from [facility]  C-SSRS RISK CATEGORY No Risk No Risk Moderate Risk        Assessment and Plan:  Patient notes her anxiety, depression, and mood has improved since her last visit but notes that her ADD has not. Today she is agreeable to start Strattera 40 mg to help manage symptoms of ADD. She will continue all other medications as prescribed  1. Bipolar 1 disorder (HCC)  Continue- escitalopram (LEXAPRO) 5 MG tablet; Take 1 tablet (5 mg total) by mouth daily.  Dispense: 30 tablet; Refill: 3 Continue- OLANZapine zydis (ZYPREXA) 20 MG disintegrating tablet; Take 1 tablet (20 mg total) by mouth at bedtime.  Dispense: 30 tablet; Refill: 3 Continue- traZODone (DESYREL) 50 MG tablet; Take 1 tablet (50 mg total) by mouth at bedtime as needed for sleep.  Dispense: 30 tablet; Refill: 3  2. Generalized anxiety disorder  Continue- escitalopram (LEXAPRO) 5 MG tablet; Take 1 tablet (5 mg total) by mouth daily.  Dispense: 30 tablet; Refill: 3 Continue- hydrOXYzine (ATARAX/VISTARIL) 10 MG tablet; Take 1 tablet (10 mg total) by mouth 3 (three) times daily as needed.  Dispense: 90 tablet; Refill: 3  3. Tobacco dependence  Continue- nicotine polacrilex (NICORETTE) 2 MG gum; Take 1 each (2 mg total) by mouth as needed for smoking cessation.  Dispense: 100 tablet; Refill: 3  4. Attention deficit hyperactivity disorder (ADHD), predominantly inattentive type  Start- atomoxetine (STRATTERA) 40 MG capsule; Take 1 capsule (40 mg total) by mouth daily.  Dispense: 30 capsule; Refill: 3  Follow up in 3 months Follow up with therapy  Shanna Cisco, NP 12/05/2020, 3:08  PM

## 2020-12-11 ENCOUNTER — Other Ambulatory Visit: Payer: Self-pay

## 2020-12-23 ENCOUNTER — Other Ambulatory Visit: Payer: Self-pay

## 2020-12-25 ENCOUNTER — Other Ambulatory Visit: Payer: Self-pay

## 2021-01-02 ENCOUNTER — Ambulatory Visit (HOSPITAL_COMMUNITY): Payer: No Payment, Other | Admitting: Licensed Clinical Social Worker

## 2021-02-05 ENCOUNTER — Other Ambulatory Visit: Payer: Self-pay

## 2021-02-06 ENCOUNTER — Other Ambulatory Visit: Payer: Self-pay

## 2021-02-10 IMAGING — DX DG ANKLE COMPLETE 3+V*R*
3 series · 3 of 3 positions shown · non-contrast
Comparison: Plain films right ankle 11/29/2018.

CLINICAL DATA: Right ankle pain and swelling. No recent injury.
History of prior fracture.

EXAM:
RIGHT ANKLE - COMPLETE 3+ VIEW

[ankle ap]
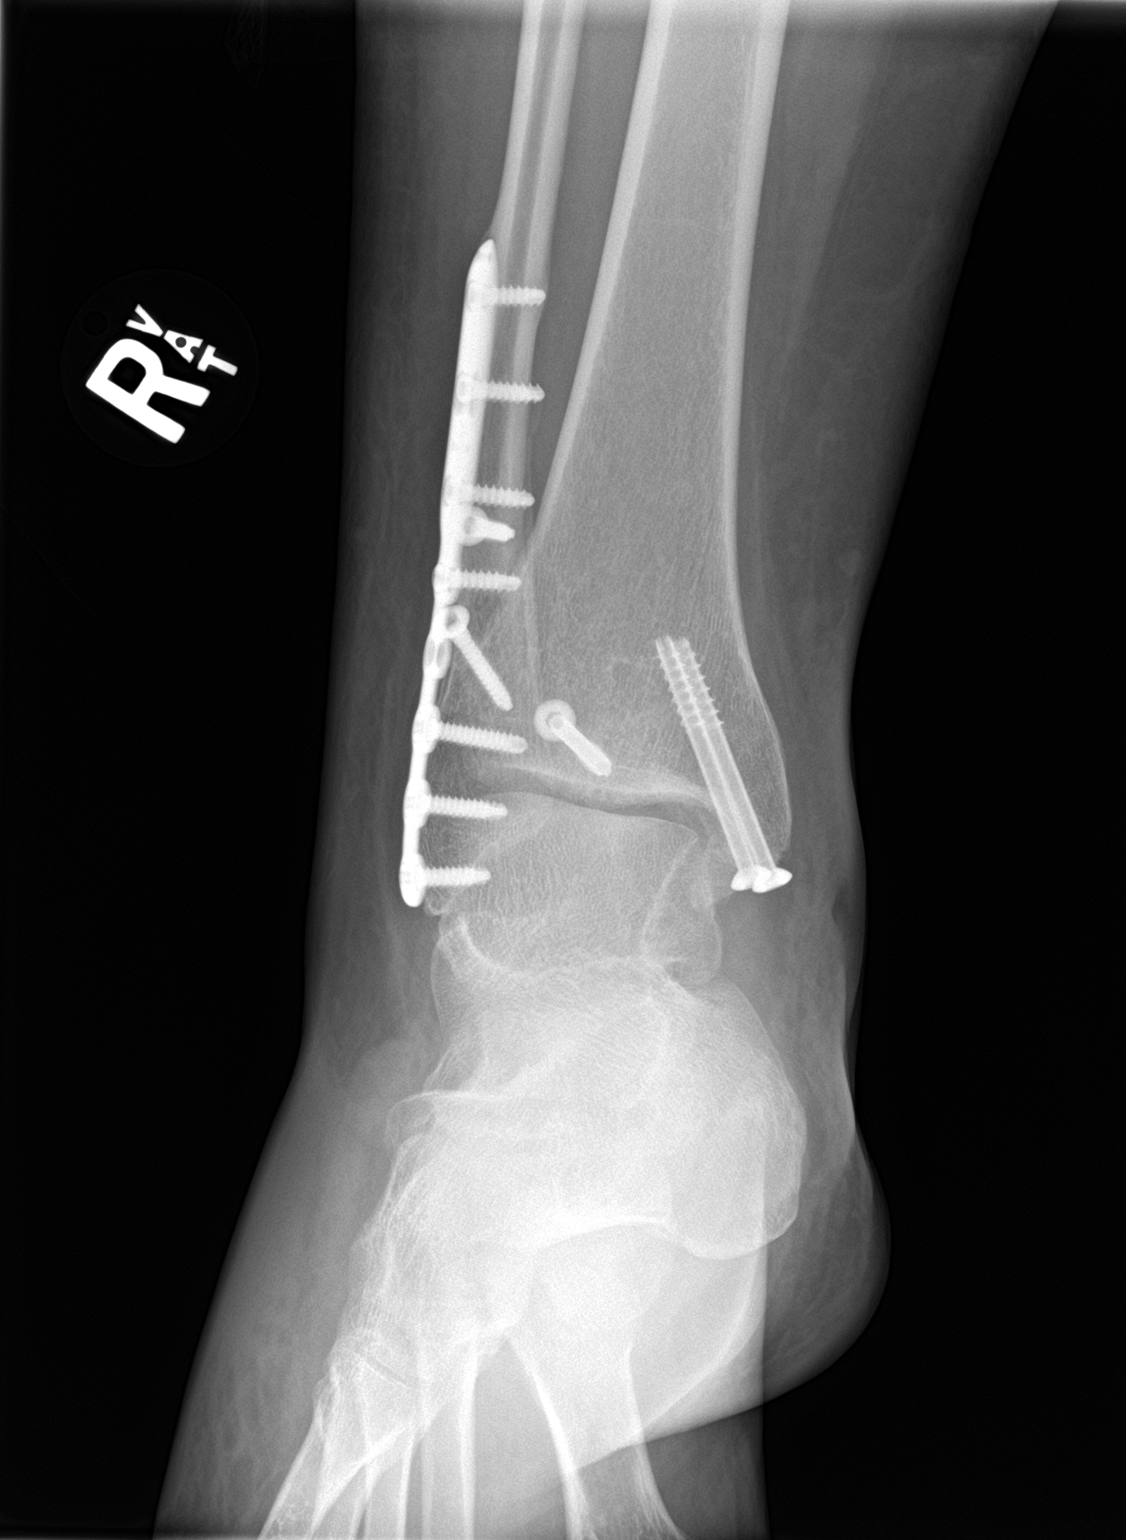

[ankle obl]
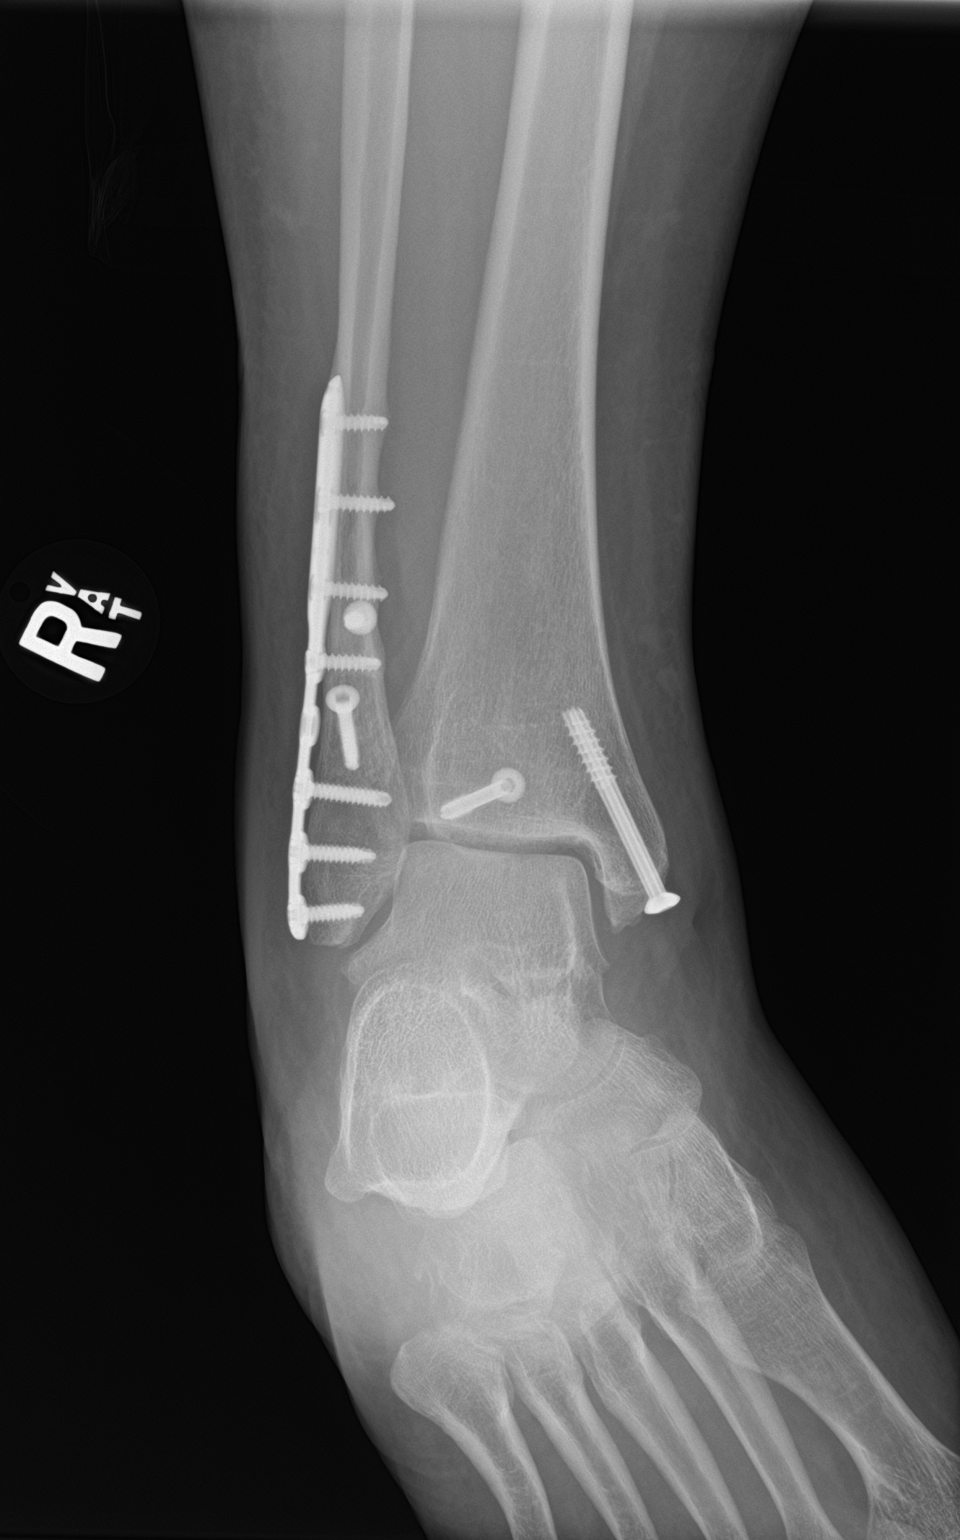

[ankle lat]
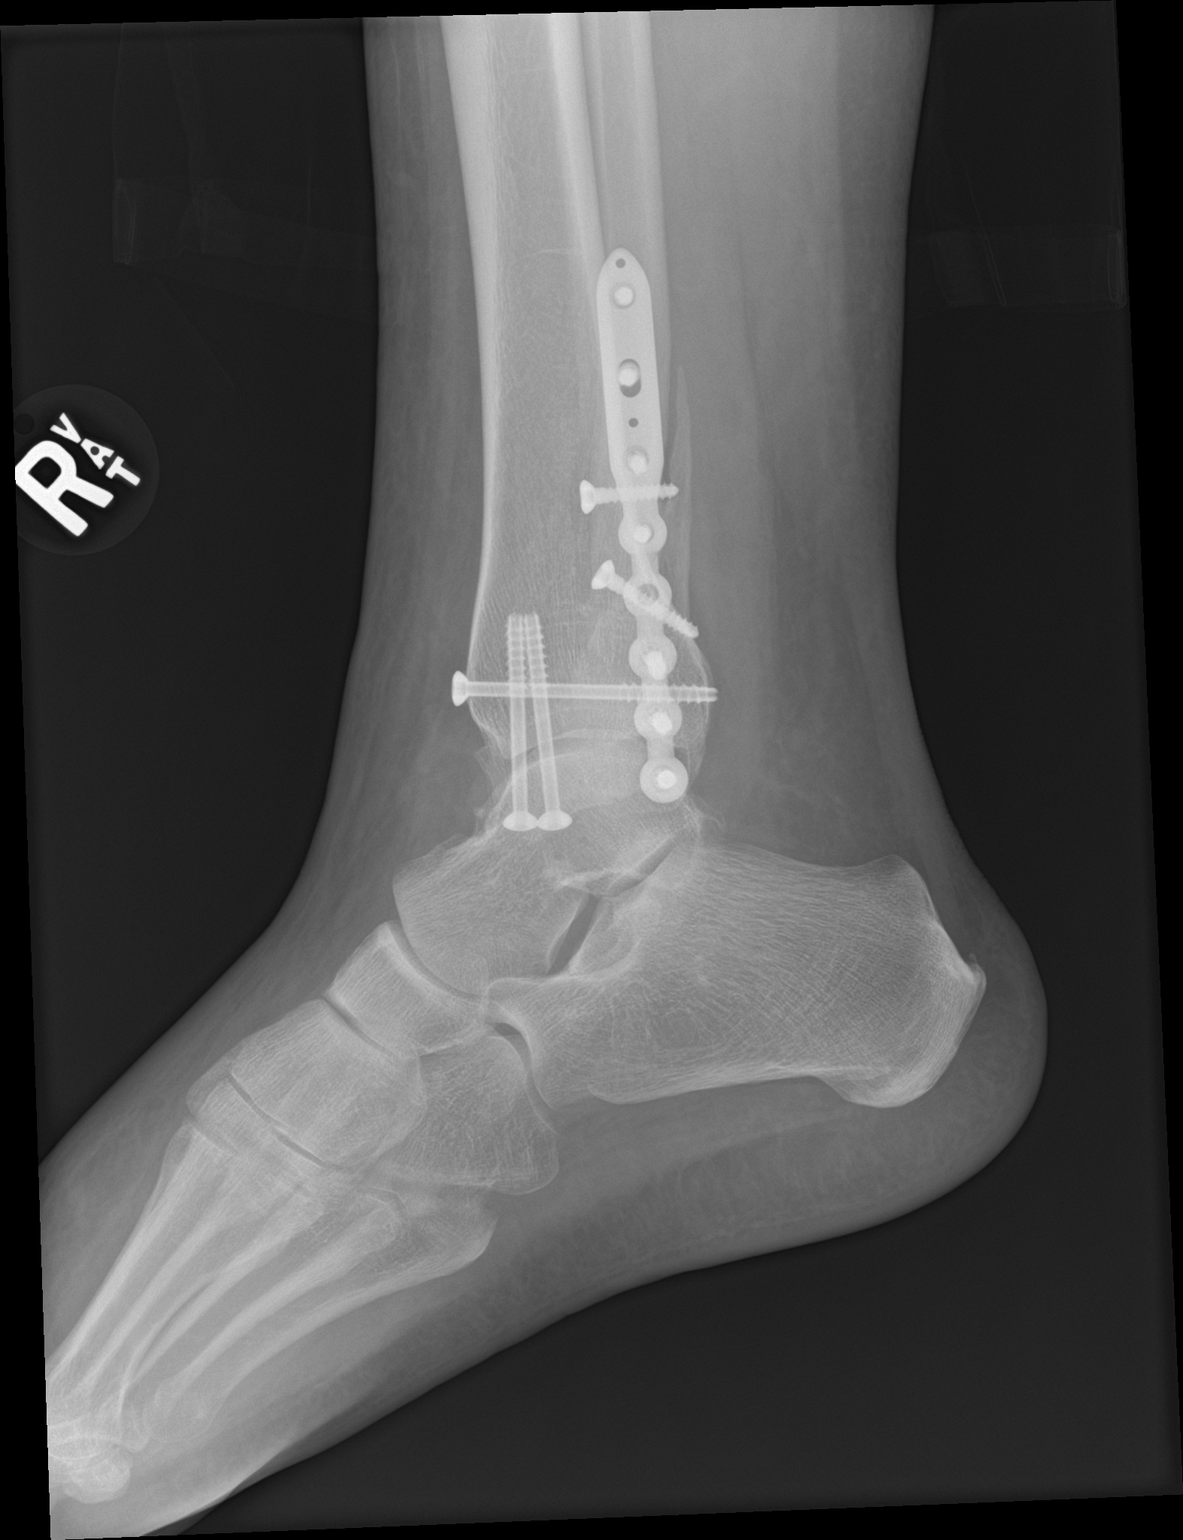

[3 of 3 positions shown; findings below may reference images not displayed]

FINDINGS: Again seen is postoperative change of fixation of tibial and fibular
fractures. Hardware is intact. Position and alignment are unchanged.
Superior margin of the patient's fibular fracture is a nonunion but
the fracture appears largely healed. Soft tissues about the ankle
are swollen but no soft tissue gas or radiopaque foreign body.
IMPRESSION: Soft tissue swelling without underlying acute bony or joint
abnormality.

## 2021-03-03 ENCOUNTER — Ambulatory Visit (INDEPENDENT_AMBULATORY_CARE_PROVIDER_SITE_OTHER): Payer: No Payment, Other | Admitting: Psychiatry

## 2021-03-03 ENCOUNTER — Encounter (HOSPITAL_COMMUNITY): Payer: Self-pay | Admitting: Psychiatry

## 2021-03-03 ENCOUNTER — Other Ambulatory Visit: Payer: Self-pay

## 2021-03-03 VITALS — BP 133/82 | HR 92 | Ht 69.0 in | Wt 302.0 lb

## 2021-03-03 DIAGNOSIS — F411 Generalized anxiety disorder: Secondary | ICD-10-CM | POA: Diagnosis not present

## 2021-03-03 DIAGNOSIS — F900 Attention-deficit hyperactivity disorder, predominantly inattentive type: Secondary | ICD-10-CM | POA: Diagnosis not present

## 2021-03-03 DIAGNOSIS — R635 Abnormal weight gain: Secondary | ICD-10-CM

## 2021-03-03 DIAGNOSIS — F17200 Nicotine dependence, unspecified, uncomplicated: Secondary | ICD-10-CM | POA: Diagnosis not present

## 2021-03-03 DIAGNOSIS — F319 Bipolar disorder, unspecified: Secondary | ICD-10-CM

## 2021-03-03 MED ORDER — OLANZAPINE 20 MG PO TBDP
20.0000 mg | ORAL_TABLET | Freq: Every day | ORAL | 3 refills | Status: DC
  Filled 2021-03-03 – 2021-05-14 (×2): qty 30, 30d supply, fill #0

## 2021-03-03 MED ORDER — NICOTINE POLACRILEX 2 MG MT GUM
2.0000 mg | CHEWING_GUM | OROMUCOSAL | 3 refills | Status: DC | PRN
  Filled 2021-03-03: qty 100, fill #0

## 2021-03-03 MED ORDER — TRAZODONE HCL 50 MG PO TABS
50.0000 mg | ORAL_TABLET | Freq: Every evening | ORAL | 3 refills | Status: DC | PRN
  Filled 2021-03-03: qty 30, 30d supply, fill #0
  Filled 2021-04-10: qty 30, 30d supply, fill #1

## 2021-03-03 MED ORDER — ESCITALOPRAM OXALATE 10 MG PO TABS
10.0000 mg | ORAL_TABLET | Freq: Every day | ORAL | 3 refills | Status: DC
  Filled 2021-03-03: qty 30, 30d supply, fill #0
  Filled 2021-04-10: qty 30, 30d supply, fill #1
  Filled 2021-05-14: qty 30, 30d supply, fill #2
  Filled 2021-07-10 – 2021-07-22 (×2): qty 30, 30d supply, fill #3

## 2021-03-03 MED ORDER — ATOMOXETINE HCL 80 MG PO CAPS
80.0000 mg | ORAL_CAPSULE | Freq: Every day | ORAL | 3 refills | Status: DC
  Filled 2021-03-03: qty 30, 30d supply, fill #0
  Filled 2021-04-14: qty 30, 30d supply, fill #1

## 2021-03-03 MED ORDER — HYDROXYZINE HCL 10 MG PO TABS
10.0000 mg | ORAL_TABLET | Freq: Three times a day (TID) | ORAL | 3 refills | Status: DC | PRN
  Filled 2021-03-03: qty 90, 30d supply, fill #0
  Filled 2021-04-10: qty 90, 30d supply, fill #1

## 2021-03-03 NOTE — Progress Notes (Signed)
BH MD/PA/NP OP Progress Note   03/03/2021 1:34 PM LADESTINY TSCHETTER  MRN:  [id number]  Chief Complaint: "My ADHD is a problem and I have been sleeping too much" Chief Complaint   Medication Management     HPI: 41 year old female seen today for follow up psychiatric evaluation.    She has psychiatric history of depression, ADD, bipolar 1 disorder, and anxiety. She is currently managed on Lexapro 5 mg daily, hydroxyzine 10mg  three time daily, Zyprexa 20mg  nightly, NicoDerm 2 mg gum, and trazodone 50mg  nightly as needed. She notes her medications are somewhat are effective in managing her psychiatric conditions.     Today she  was well-groomed, pleasant, cooperative, engaged in conversation, and maintained eye contact.  She informed Probation officer that she continues to have issues with ADD.  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  She also notes that she has been more anxious about her upcoming position.  Provider conducted a GAD-7 and patient scored a 12, at her last visit she scored an 8.  Patient also notes that since her last visit she has gained over 40 pounds.  She informed Probation officer that she was in instructed that she may need to have her thyroid levels checked however reports that she never got it done.  She informed Probation officer that she is sleeping approximately 10 hours nightly and has an increased appetite.  Today provider conducted a PHQ-9 and patient scored 19, at her last visit she scored 18.  Today she denies SI/HI/VAH, mania, paranoia.   Patient agreeable to increasing Lexapro 5 mg to 10 mg to help manage anxiety and depression.  She is also agreeable to increasing Strattera 40 mg to 80 mg to help manage concentration.  Provider discussed potentially starting Wellbutrin at next visit.  She will continue all other medications as prescribed. Provider ordered liver function test, thyroid panel, and  CBC.  Provider also recommended patient seeing primary care doctor.  Patient was referred to community health and wellness.  She will follow-up with outpatient counseling for therapy.  No other concerns at this time.    .    Visit Diagnosis:    ICD-10-CM   1. Weight gain  R63.5 Ambulatory referral to Internal Medicine    2. Attention deficit hyperactivity disorder (ADHD), predominantly inattentive type  F90.0 atomoxetine (STRATTERA) 80 MG capsule    3. Bipolar 1 disorder (HCC)  F31.9 escitalopram (LEXAPRO) 10 MG tablet    OLANZapine zydis (ZYPREXA) 20 MG disintegrating tablet    traZODone (DESYREL) 50 MG tablet    CBC with Differential    Hepatic function panel    Thyroid Panel With TSH    CANCELED: Thyroid Panel With TSH    CANCELED: Hepatic function panel    CANCELED: CBC with Differential    4. Generalized anxiety disorder  F41.1 escitalopram (LEXAPRO) 10 MG tablet    hydrOXYzine (ATARAX) 10 MG tablet    5. Tobacco dependence  F17.200 nicotine polacrilex (NICORETTE) 2 MG gum      Past Psychiatric History:  depression, ADD, bipolar 1 disorder, and anxiety  Past Medical History:  Past Medical History:  Diagnosis Date   ADD 10/22/2009   ALLERGIC RHINITIS 09/26/2008   Anemia    ANXIETY 09/26/2008   Asthma    Complication of anesthesia    "tempermental" per patient, "aggiated and hostile"   CONSTIPATION 09/26/2008   DEPRESSION  09/26/2008   FATIGUE 01/14/2009   Head injury with loss of consciousness ([place])    Headache    HYPERLIPIDEMIA 09/26/2008   IBS (irritable bowel syndrome)    OTITIS MEDIA, LEFT 01/14/2009   SKIN LESION 09/26/2008   Sleep apnea    TONSILLITIS, ACUTE 01/14/2009   Unconscious state ([place]) 10/13/2017   URI 10/22/2009    Past Surgical History:  Procedure Laterality Date   CERVICAL POLYPECTOMY  2009   CERVICAL POLYPECTOMY  2019   DILATATION & CURETTAGE/HYSTEROSCOPY WITH MYOSURE N/A 11/17/2017   Procedure: DILATATION & CURETTAGE/HYSTEROSCOPY WITH MYOSURE;   Surgeon: Thurnell Lose, MD;  Location: [facility];  Service: Gynecology;  Laterality: N/A;   ORIF ANKLE FRACTURE Right 08/24/2018   Procedure: OPEN REDUCTION INTERNAL FIXATION TRIMALLEOLAR ANKLE FRACTURE;  Surgeon: Marybelle Killings, MD;  Location: [place];  Service: Orthopedics;  Laterality: Right;    Family Psychiatric History: Brother depression and mother anxiety  Family History:  Family History  Problem Relation Age of Onset   Diabetes Other    Hypertension Other    Thyroid disease Mother    Hypertension Mother     Social History:  Social History   Socioeconomic History   Marital status: Single    Spouse name: Not on file   Number of children: Not on file   Years of education: Not on file   Highest education level: Not on file  Occupational History   Occupation: full time waitress  Tobacco Use   Smoking status: Every Day    Packs/day: 1.00    Years: 20.00    Pack years: 20.00    Types: Cigarettes   Smokeless tobacco: Never  Vaping Use   Vaping Use: Former  Substance and Sexual Activity   Alcohol use: Not Currently   Drug use: No   Sexual activity: Not Currently    Birth control/protection: Abstinence  Other Topics Concern   Not on file  Social History Narrative   Lives with mother   Social Determinants of Health   Financial Resource Strain: Not on file  Food Insecurity: Not on file  Transportation Needs: Not on file  Physical Activity: Not on file  Stress: Not on file  Social Connections: Not on file    Allergies: No Known Allergies  Metabolic Disorder Labs: Lab Results  Component Value Date   HGBA1C 5.3 06/22/2020   MPG 105 06/22/2020   MPG 108 07/25/2019   No results found for: PROLACTIN Lab Results  Component Value Date   CHOL 148 06/22/2020   TRIG 111 06/22/2020   HDL 46 06/22/2020   CHOLHDL 3.2 06/22/2020   VLDL 22 06/22/2020   [place] 80 06/22/2020   [place] 80 07/25/2019   Lab Results  Component Value Date   TSH 4.263 06/22/2020   TSH  3.023 07/25/2019    Therapeutic Level Labs: No results found for: LITHIUM No results found for: VALPROATE No components found for:  CBMZ  Current Medications: Current Outpatient Medications  Medication Sig Dispense Refill   atomoxetine (STRATTERA) 80 MG capsule Take 1 capsule (80 mg total) by mouth daily. 30 capsule 3   escitalopram (LEXAPRO) 10 MG tablet Take 1 tablet (10 mg total) by mouth daily. 30 tablet 3   hydrOXYzine (ATARAX) 10 MG tablet Take 1 tablet (10 mg total) by mouth 3 (three) times daily as needed. 90 tablet 3   nicotine polacrilex (NICORETTE) 2 MG gum Take 1 each (2 mg total) by mouth as needed for smoking cessation. 100 tablet  3   OLANZapine zydis (ZYPREXA) 20 MG disintegrating tablet Take 1 tablet (20 mg total) by mouth at bedtime. 30 tablet 3   traZODone (DESYREL) 50 MG tablet Take 1 tablet (50 mg total) by mouth at bedtime as needed for sleep. 30 tablet 3   No current facility-administered medications for this visit.     Musculoskeletal: Strength & Muscle Tone: within normal limits Gait & Station: normal Patient leans: N/A  Psychiatric Specialty Exam: Review of Systems  Blood pressure 133/82, pulse 92, height 5\' 9"  (1.753 m), weight (!) 302 lb (137 kg).Body mass index is 44.6 kg/m.  General Appearance: Well Groomed  Eye Contact:  Good  Speech:  Clear and Coherent and Normal Rate  Volume:  Normal  Mood:  Anxious and Depressed  Affect:  Appropriate and Congruent  Thought Process:  Coherent, Goal Directed, and Linear  Orientation:  Full (Time, Place, and Person)  Thought Content: WDL and Logical   Suicidal Thoughts:  No  Homicidal Thoughts:  No  Memory:  Immediate;   Good Recent;   Good Remote;   Good  Judgement:  Good  Insight:  Good  Psychomotor Activity:  Normal  Concentration:  Concentration: Fair and Attention Span: Fair  Recall:  Good  Fund of Knowledge: Good  Language: Good  Akathisia:  No  Handed:  Right  AIMS (if indicated): not done   Assets:  Communication Skills Desire for Improvement Financial Resources/Insurance Housing Intimacy Physical Health Social Support  ADL's:  Intact  Cognition: WNL  Sleep:  Fair   Screenings: AIMS    Flowsheet Row Admission (Discharged) from 07/24/2019 in [place] [facility] Admission (Discharged) from 11/18/2018 in [place] [facility] Admission (Discharged) from 05/25/2018 in [place] [facility] Admission (Discharged) from 10/14/2017 in [place] [facility]  AIMS Total Score 0 0 0 0      AUDIT    Flowsheet Row Admission (Discharged) from OP Visit from 06/21/2020 in [place] [facility] Admission (Discharged) from 07/24/2019 in [place] [facility] Admission (Discharged) from 11/18/2018 in [place] [facility] Admission (Discharged) from 10/14/2017 in [place] [facility]  Alcohol Use Disorder Identification Test Final Score (AUDIT) 0 0 4 0      GAD-7    Flowsheet Row Clinical Support from 03/03/2021 in [facility] Video Visit from 12/05/2020 in [facility] Counselor from 10/02/2020 in [place] from 09/04/2020 in [facility] Counselor from 07/24/2020 in [facility]  Total GAD-7 Score 12 8 12 21 14       PHQ2-9    Flowsheet Row Clinical Support from 03/03/2021 in [facility] Video Visit from 12/05/2020 in [facility] Counselor from 10/02/2020 in [place] from 09/04/2020 in [facility] Counselor from 07/24/2020 in [place]  PHQ-2 Total Score 4 5 3 1 4   PHQ-9 Total Score 19  15 13 18 16       Flowsheet Row Clinical Support from 09/04/2020 in [facility] Counselor from 07/24/2020 in [facility] Admission (Discharged) from OP Visit from 06/21/2020 in [place] [facility]  C-SSRS RISK CATEGORY No Risk No Risk Moderate Risk        Assessment  and Plan: Patient notes her anxiety, depression, and mood has improved since her last visit but notes that her ADD has not.  She also notes that she has gained over 40 pounds since her last visit.  Patient agreeable to increasing Lexapro 5 mg to 10 mg to help manage anxiety and depression.  She is also agreeable to increasing Strattera 40 mg to 80 mg to help manage concentration.  Provider discussed potentially starting Wellbutrin at next visit.  She will continue all other medications as prescribed. Provider ordered liver function test, thyroid panel, and CBC.  Provider also recommended patient seeing primary care doctor.  Patient was referred to community health and wellness.    1. Attention deficit hyperactivity disorder (ADHD), predominantly inattentive type  Increase- atomoxetine (STRATTERA) 80 MG capsule; Take 1 capsule (80 mg total) by mouth daily.  Dispense: 30 capsule; Refill: 3  2. Bipolar 1 disorder (HCC)  Increase- escitalopram (LEXAPRO) 10 MG tablet; Take 1 tablet (10 mg total) by mouth daily.  Dispense: 30 tablet; Refill: 3 Continue- OLANZapine zydis (ZYPREXA) 20 MG disintegrating tablet; Take 1 tablet (20 mg total) by mouth at bedtime.  Dispense: 30 tablet; Refill: 3 Continue- traZODone (DESYREL) 50 MG tablet; Take 1 tablet (50 mg total) by mouth at bedtime as needed for sleep.  Dispense: 30 tablet; Refill: 3 - CBC with Differential - Hepatic function panel - Thyroid Panel With TSH  3. Generalized anxiety disorder  Increase- escitalopram (LEXAPRO) 10 MG tablet; Take 1 tablet (10 mg total) by mouth daily.  Dispense: 30 tablet;  Refill: 3 Continue- hydrOXYzine (ATARAX) 10 MG tablet; Take 1 tablet (10 mg total) by mouth 3 (three) times daily as needed.  Dispense: 90 tablet; Refill: 3  4. Tobacco dependence  Continue- nicotine polacrilex (NICORETTE) 2 MG gum; Take 1 each (2 mg total) by mouth as needed for smoking cessation.  Dispense: 100 tablet; Refill: 3  5. Weight gain  - Ambulatory referral to Internal Medicine  Follow up in 3 months Follow up with therapy  Salley Slaughter, NP 03/03/2021, 1:34 PM

## 2021-03-07 ENCOUNTER — Other Ambulatory Visit: Payer: Self-pay

## 2021-04-11 ENCOUNTER — Other Ambulatory Visit: Payer: Self-pay

## 2021-04-15 ENCOUNTER — Other Ambulatory Visit: Payer: Self-pay

## 2021-05-14 ENCOUNTER — Other Ambulatory Visit: Payer: Self-pay

## 2021-05-20 ENCOUNTER — Encounter (HOSPITAL_COMMUNITY): Payer: No Payment, Other | Admitting: Psychiatry

## 2021-05-26 ENCOUNTER — Telehealth (HOSPITAL_COMMUNITY): Payer: No Payment, Other | Admitting: Psychiatry

## 2021-06-10 ENCOUNTER — Other Ambulatory Visit: Payer: Self-pay

## 2021-06-10 ENCOUNTER — Ambulatory Visit (INDEPENDENT_AMBULATORY_CARE_PROVIDER_SITE_OTHER): Payer: No Payment, Other | Admitting: Psychiatry

## 2021-06-10 DIAGNOSIS — F411 Generalized anxiety disorder: Secondary | ICD-10-CM | POA: Diagnosis not present

## 2021-06-10 DIAGNOSIS — F900 Attention-deficit hyperactivity disorder, predominantly inattentive type: Secondary | ICD-10-CM | POA: Diagnosis not present

## 2021-06-10 MED ORDER — HYDROXYZINE HCL 25 MG PO TABS
25.0000 mg | ORAL_TABLET | Freq: Three times a day (TID) | ORAL | 0 refills | Status: DC | PRN
  Filled 2021-06-10: qty 90, 30d supply, fill #0

## 2021-06-10 NOTE — Progress Notes (Signed)
BH MD/PA/NP OP Progress Note ? ?06/10/2021 12:29 PM ?[name]  ?MRN:  [id number] ? ?Virtual Visit via Telephone Note ? ?I connected with [name] on 06/10/21 at 10:30 AM EDT by telephone and verified that I am speaking with the correct person using two identifiers. ? ?Location: ?Patient: home ?Provider: offsite ?  ?I discussed the limitations, risks, security and privacy concerns of performing an evaluation and management service by telephone and the availability of in person appointments. I also discussed with the patient that there may be a patient responsible charge related to this service. The patient expressed understanding and agreed to proceed. ? ? ?  ?I discussed the assessment and treatment plan with the patient. The patient was provided an opportunity to ask questions and all were answered. The patient agreed with the plan and demonstrated an understanding of the instructions. ?  ?The patient was advised to call back or seek an in-person evaluation if the symptoms worsen or if the condition fails to improve as anticipated. ? ?I provided 10 minutes of non-face-to-face time during this encounter. ? ? ?Janet Sober, NP  ? ?Chief Complaint: Medication management ? ?HPI: [name] is a 41 year old female presenting to [facility] behavioral health outpatient for psychiatric evaluation.  She has a psychiatric history of bipolar disorder, generalized anxiety disorder, depression and ADHD.  Her symptoms are managed with Strattera 80 mg daily, Lexapro 10 mg daily, hydroxyzine 10 mg 3 times daily as needed for anxiety, nicotine Nicorette 2 mg gum as needed, Zyprexa 20 mg at bedtime and trazodone 50 mg at bedtime as needed for sleep.  Patient reports medication compliance and denies adverse medication effects.  Patient reports continued anxiety and difficulty concentrating.  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  Patient reports that she is searching for a job  currently.  She is open to participating in therapy to better manage ADHD symptoms.  Patient also agreeable with increasing hydroxyzine today to manage anxiety symptoms, as she feels hydroxyzine is ineffective.  Hydroxyzine increased to 25 mg 3 times daily as needed for anxiety. ? ? ? ?Visit Diagnosis:  ?  ICD-10-CM   ?1. Attention deficit hyperactivity disorder (ADHD), predominantly inattentive type  F90.0   ?  ?2. Generalized anxiety disorder  F41.1 hydrOXYzine (ATARAX) 25 MG tablet  ?  ? ? ?Past Psychiatric History: ADHD, bipolar disorder, generalized anxiety disorder, depression ? ?Past Medical History:  ?Past Medical History:  ?Diagnosis Date  ? ADD 10/22/2009  ? ALLERGIC RHINITIS 09/26/2008  ? Anemia   ? ANXIETY 09/26/2008  ? Asthma   ? Complication of anesthesia   ? "tempermental" per patient, "aggiated and hostile"  ? CONSTIPATION 09/26/2008  ? DEPRESSION 09/26/2008  ? FATIGUE 01/14/2009  ? Head injury with loss of consciousness (HCC)   ? Headache   ? HYPERLIPIDEMIA 09/26/2008  ? IBS (irritable bowel syndrome)   ? OTITIS MEDIA, LEFT 01/14/2009  ? SKIN LESION 09/26/2008  ? Sleep apnea   ? TONSILLITIS, ACUTE 01/14/2009  ? Unconscious state (HCC) 10/13/2017  ? URI 10/22/2009  ?  ?Past Surgical History:  ?Procedure Laterality Date  ? CERVICAL POLYPECTOMY  2009  ? CERVICAL POLYPECTOMY  2019  ? DILATATION & CURETTAGE/HYSTEROSCOPY WITH MYOSURE N/A 11/17/2017  ? Procedure: DILATATION & CURETTAGE/HYSTEROSCOPY WITH MYOSURE;  Surgeon: Janet Rankins, MD;  Location: [facility];  Service: Gynecology;  Laterality: N/A;  ? ORIF ANKLE FRACTURE Right 08/24/2018  ? Procedure: OPEN REDUCTION INTERNAL FIXATION TRIMALLEOLAR  ANKLE FRACTURE;  Surgeon: Janet Manges, MD;  Location: [facility];  Service: Orthopedics;  Laterality: Right;  ? ? ?Family Psychiatric History: n/a ? ?Family History:  ?Family History  ?Problem Relation Age of Onset  ? Diabetes Other   ? Hypertension Other   ? Thyroid disease Mother   ? Hypertension Mother   ? ? ?Social History:   ?Social History  ? ?Socioeconomic History  ? Marital status: Single  ?  Spouse name: Not on file  ? Number of children: Not on file  ? Years of education: Not on file  ? Highest education level: Not on file  ?Occupational History  ? Occupation: full time waitress  ?Tobacco Use  ? Smoking status: Every Day  ?  Packs/day: 1.00  ?  Years: 20.00  ?  Pack years: 20.00  ?  Types: Cigarettes  ? Smokeless tobacco: Never  ?Vaping Use  ? Vaping Use: Former  ?Substance and Sexual Activity  ? Alcohol use: Not Currently  ? Drug use: No  ? Sexual activity: Not Currently  ?  Birth control/protection: Abstinence  ?Other Topics Concern  ? Not on file  ?Social History Narrative  ? Lives with mother  ? ?Social Determinants of Health  ? ?Financial Resource Strain: Not on file  ?Food Insecurity: Not on file  ?Transportation Needs: Not on file  ?Physical Activity: Not on file  ?Stress: Not on file  ?Social Connections: Not on file  ? ? ?Allergies: No Known Allergies ? ?Metabolic Disorder Labs: ?Lab Results  ?Component Value Date  ? HGBA1C 5.3 06/22/2020  ? MPG 105 06/22/2020  ? MPG 108 07/25/2019  ? ?No results found for: PROLACTIN ?Lab Results  ?Component Value Date  ? CHOL 148 06/22/2020  ? TRIG 111 06/22/2020  ? HDL 46 06/22/2020  ? CHOLHDL 3.2 06/22/2020  ? VLDL 22 06/22/2020  ? LDLCALC 80 06/22/2020  ? LDLCALC 80 07/25/2019  ? ?Lab Results  ?Component Value Date  ? TSH 4.263 06/22/2020  ? TSH 3.023 07/25/2019  ? ? ?Therapeutic Level Labs: ?No results found for: LITHIUM ?No results found for: VALPROATE ?No components found for:  CBMZ ? ?Current Medications: ?Current Outpatient Medications  ?Medication Sig Dispense Refill  ? atomoxetine (STRATTERA) 80 MG capsule Take 1 capsule (80 mg total) by mouth daily. 30 capsule 3  ? escitalopram (LEXAPRO) 10 MG tablet Take 1 tablet (10 mg total) by mouth daily. 30 tablet 3  ? hydrOXYzine (ATARAX) 25 MG tablet Take 1 tablet (25 mg total) by mouth 3 (three) times daily as needed for anxiety. 90  tablet 0  ? nicotine polacrilex (NICORETTE) 2 MG gum Take 1 each (2 mg total) by mouth as needed for smoking cessation. 100 tablet 3  ? OLANZapine zydis (ZYPREXA) 20 MG disintegrating tablet Take 1 tablet (20 mg total) by mouth at bedtime. 30 tablet 3  ? traZODone (DESYREL) 50 MG tablet Take 1 tablet (50 mg total) by mouth at bedtime as needed for sleep. 30 tablet 3  ? ?No current facility-administered medications for this visit.  ? ? ? ?Musculoskeletal: ?Strength & Muscle Tone: n/a ?Gait & Station: n/a ?Patient leans: N/A ? ?Psychiatric Specialty Exam: ?Review of Systems  ?Psychiatric/Behavioral:  Positive for decreased concentration. Negative for hallucinations, self-injury and suicidal ideas. The patient is nervous/anxious.   ?All other systems reviewed and are negative.  ?There were no vitals taken for this visit.There is no height or weight on file to calculate BMI.  ?General Appearance: NA  ?Eye  Contact:  NA  ?Speech:  Clear and Coherent  ?Volume:  Normal  ?Mood:  Euthymic  ?Affect:  NA  ?Thought Process:  Goal Directed  ?Orientation:  Full (Time, Place, and Person)  ?Thought Content: Logical   ?Suicidal Thoughts:  No  ?Homicidal Thoughts:  No  ?Memory:  good  ?Judgement:  Good  ?Insight:  Good  ?Psychomotor Activity:  NA  ?Concentration:  good  ?Recall:  Good  ?Fund of Knowledge: Good  ?Language: Good  ?Akathisia:  NA  ?Handed:  Right  ?AIMS (if indicated): not done  ?Assets:  Communication Skills ?Desire for Improvement  ?ADL's:  Intact  ?Cognition: WNL  ?Sleep:  Good  ? ?Screenings: ?AIMS   ? ?Flowsheet Row [facility] (Dis[facility] Ad[facility] (Dis[facility]) [facility] Admission (Discharged) [facility]  ?AIMS Total Score 0 0 0 0  ? ?  ? ?AUDIT   ? ?Flowsheet  Row Admission (Discharged) from [facility] Admission (Discharged) [facility] Admission (Discha

## 2021-06-17 ENCOUNTER — Other Ambulatory Visit: Payer: Self-pay

## 2021-07-10 ENCOUNTER — Other Ambulatory Visit: Payer: Self-pay

## 2021-07-16 ENCOUNTER — Other Ambulatory Visit: Payer: Self-pay

## 2021-07-22 ENCOUNTER — Other Ambulatory Visit: Payer: Self-pay

## 2021-08-28 ENCOUNTER — Other Ambulatory Visit (HOSPITAL_COMMUNITY): Payer: Self-pay | Admitting: Psychiatry

## 2021-08-28 DIAGNOSIS — F319 Bipolar disorder, unspecified: Secondary | ICD-10-CM

## 2021-08-28 DIAGNOSIS — F411 Generalized anxiety disorder: Secondary | ICD-10-CM

## 2021-08-29 ENCOUNTER — Other Ambulatory Visit: Payer: Self-pay

## 2021-08-29 MED ORDER — ESCITALOPRAM OXALATE 10 MG PO TABS
10.0000 mg | ORAL_TABLET | Freq: Every day | ORAL | 3 refills | Status: DC
  Filled 2021-08-29: qty 30, 30d supply, fill #0

## 2021-09-01 ENCOUNTER — Other Ambulatory Visit (HOSPITAL_COMMUNITY): Payer: Self-pay | Admitting: Psychiatry

## 2021-09-01 ENCOUNTER — Other Ambulatory Visit: Payer: Self-pay

## 2021-09-01 ENCOUNTER — Telehealth (HOSPITAL_COMMUNITY): Payer: Self-pay | Admitting: *Deleted

## 2021-09-01 DIAGNOSIS — F319 Bipolar disorder, unspecified: Secondary | ICD-10-CM

## 2021-09-01 DIAGNOSIS — F900 Attention-deficit hyperactivity disorder, predominantly inattentive type: Secondary | ICD-10-CM

## 2021-09-01 DIAGNOSIS — F411 Generalized anxiety disorder: Secondary | ICD-10-CM

## 2021-09-01 DIAGNOSIS — F17200 Nicotine dependence, unspecified, uncomplicated: Secondary | ICD-10-CM

## 2021-09-01 MED ORDER — ATOMOXETINE HCL 80 MG PO CAPS
80.0000 mg | ORAL_CAPSULE | Freq: Every day | ORAL | 3 refills | Status: DC
  Filled 2021-09-01: qty 30, 30d supply, fill #0

## 2021-09-01 MED ORDER — TRAZODONE HCL 50 MG PO TABS
50.0000 mg | ORAL_TABLET | Freq: Every evening | ORAL | 3 refills | Status: DC | PRN
  Filled 2021-09-01: qty 30, 30d supply, fill #0

## 2021-09-01 MED ORDER — HYDROXYZINE HCL 25 MG PO TABS
25.0000 mg | ORAL_TABLET | Freq: Three times a day (TID) | ORAL | 3 refills | Status: DC | PRN
  Filled 2021-09-01: qty 90, 30d supply, fill #0

## 2021-09-01 MED ORDER — NICOTINE POLACRILEX 2 MG MT GUM
2.0000 mg | CHEWING_GUM | OROMUCOSAL | 3 refills | Status: DC | PRN
  Filled 2021-09-01: qty 50, 12d supply, fill #0

## 2021-09-01 MED ORDER — OLANZAPINE 20 MG PO TBDP
20.0000 mg | ORAL_TABLET | Freq: Every day | ORAL | 3 refills | Status: DC
  Filled 2021-09-01: qty 30, 30d supply, fill #0

## 2021-09-01 MED ORDER — ESCITALOPRAM OXALATE 10 MG PO TABS: 10.0000 mg | ORAL_TABLET | Freq: Every day | ORAL | 3 refills | Status: DC

## 2021-09-01 NOTE — Telephone Encounter (Signed)
Medication refilled and sent to preferred pharmacy,

## 2021-09-01 NOTE — Telephone Encounter (Signed)
Patient Called Refill "stated she needs refill on her anti depressant medication"

## 2021-09-05 ENCOUNTER — Other Ambulatory Visit: Payer: Self-pay

## 2021-09-10 ENCOUNTER — Encounter (HOSPITAL_COMMUNITY): Payer: Self-pay | Admitting: Psychiatry

## 2021-09-10 ENCOUNTER — Ambulatory Visit (INDEPENDENT_AMBULATORY_CARE_PROVIDER_SITE_OTHER): Payer: Self-pay | Admitting: Psychiatry

## 2021-09-10 ENCOUNTER — Other Ambulatory Visit: Payer: Self-pay

## 2021-09-10 DIAGNOSIS — F900 Attention-deficit hyperactivity disorder, predominantly inattentive type: Secondary | ICD-10-CM

## 2021-09-10 DIAGNOSIS — F411 Generalized anxiety disorder: Secondary | ICD-10-CM | POA: Diagnosis not present

## 2021-09-10 DIAGNOSIS — F319 Bipolar disorder, unspecified: Secondary | ICD-10-CM

## 2021-09-10 DIAGNOSIS — F17200 Nicotine dependence, unspecified, uncomplicated: Secondary | ICD-10-CM | POA: Diagnosis not present

## 2021-09-10 MED ORDER — ATOMOXETINE HCL 80 MG PO CAPS
80.0000 mg | ORAL_CAPSULE | Freq: Every day | ORAL | 3 refills | Status: DC
  Filled 2021-09-10: qty 30, 30d supply, fill #0

## 2021-09-10 MED ORDER — NICOTINE POLACRILEX 2 MG MT GUM
2.0000 mg | CHEWING_GUM | OROMUCOSAL | 3 refills | Status: DC | PRN
  Filled 2021-09-10: qty 50, 50d supply, fill #0

## 2021-09-10 MED ORDER — HYDROXYZINE HCL 25 MG PO TABS
25.0000 mg | ORAL_TABLET | Freq: Three times a day (TID) | ORAL | 3 refills | Status: DC | PRN
  Filled 2021-09-10: qty 90, 30d supply, fill #0
  Filled 2021-11-13: qty 90, 30d supply, fill #1

## 2021-09-10 MED ORDER — ESCITALOPRAM OXALATE 10 MG PO TABS
10.0000 mg | ORAL_TABLET | Freq: Every day | ORAL | 3 refills | Status: DC
  Filled 2021-09-10: qty 30, 30d supply, fill #0
  Filled 2021-10-31: qty 30, 30d supply, fill #1

## 2021-09-10 MED ORDER — TRAZODONE HCL 50 MG PO TABS
50.0000 mg | ORAL_TABLET | Freq: Every evening | ORAL | 3 refills | Status: DC | PRN
  Filled 2021-09-10: qty 30, 30d supply, fill #0

## 2021-09-10 MED ORDER — OLANZAPINE 20 MG PO TBDP
20.0000 mg | ORAL_TABLET | Freq: Every day | ORAL | 3 refills | Status: DC
  Filled 2021-09-10: qty 30, 30d supply, fill #0

## 2021-09-10 NOTE — Progress Notes (Signed)
BH MD/PA/NP OP Progress Note Virtual Visit via Telephone Note  I connected with [name] on 09/10/21 at 11:00 AM EDT by telephone and verified that I am speaking with the correct person using two identifiers.  Location: Patient: home Provider: Clinic   I discussed the limitations, risks, security and privacy concerns of performing an evaluation and management service by telephone and the availability of in person appointments. I also discussed with the patient that there may be a patient responsible charge related to this service. The patient expressed understanding and agreed to proceed.   I provided 30 minutes of non-face-to-face time during this encounter.   09/10/2021 10:50 AM [name]  MRN:  [id number]  Chief Complaint: "My ADHD is a problem and I have been sleeping too much"    HPI: 41 year old female seen today for follow up psychiatric evaluation.    She has psychiatric history of depression, ADD, bipolar 1 disorder, and anxiety. She is currently managed on Lexapro 10 mg daily, hydroxyzine 25 mg three time daily, Zyprexa 20mg  nightly, NicoDerm 2 mg gum, and trazodone 50mg  nightly as needed. She notes her medications are effective in managing her psychiatric conditions.     Today she  was able to logon virtually sources was done over the phone.  During exam she was pleasant, cooperative, and engaged in conversation.  She informed that overall she is doing well however notes that she oversleeps at night.  Patient informed writer that she sleeps approximately 10 hours at night.  Provider asked patient if she was taking trazodone and Zyprexa nightly.  She notes that she only takes trazodone periodically.  She denies sedating effects of hydroxyzine.  Patient does note that she recently started a new job at a but notes that she does not find her job tiring.  Patient does note that her mood is stable and reports that she has minimal anxiety and depression.   Provider conducted a GAD 7 and patient scored a 9.  Provider also conducted PHQ 9 and patient scored a 14.  She endorses having an increased appetite and reports that she has gained some weight since her last visit but is uncertain how much.  Today she denies SI/HI/VH, mania, or paranoia.   Provider recommended patient following up with primary care doctor for further evaluation of fatigue.  She notes that she would like to have her thyroid checked.  Patient last visit with writer THS panel was ordered.  Provider [facility]  She endorsed understanding and agreed.  No medication changes made today.  Patient agreeable to continue medications as prescribed.  She will follow-up with outpatient counseling for therapy.  No other concerns at this time.    .    Visit Diagnosis:    ICD-10-CM   1. Attention deficit hyperactivity disorder (ADHD), predominantly inattentive type  F90.0 atomoxetine (STRATTERA) 80 MG capsule    2. Bipolar 1 disorder (HCC)  F31.9 escitalopram (LEXAPRO) 10 MG tablet    OLANZapine zydis (ZYPREXA) 20 MG disintegrating tablet    traZODone (DESYREL) 50 MG tablet    3. Generalized anxiety disorder  F41.1 escitalopram (LEXAPRO) 10 MG tablet    hydrOXYzine (ATARAX) 25 MG tablet    4. Tobacco dependence  F17.200 nicotine polacrilex (NICORETTE) 2 MG gum      Past Psychiatric History:  depression, ADD, bipolar 1 disorder, and anxiety  Past Medical History:  Past Medical History:  Diagnosis  Date   ADD 10/22/2009   ALLERGIC RHINITIS 09/26/2008   Anemia    ANXIETY 09/26/2008   Asthma    Complication of anesthesia    "tempermental" per patient, "aggiated and hostile"   CONSTIPATION 09/26/2008   DEPRESSION 09/26/2008   FATIGUE 01/14/2009   Head injury with loss of consciousness (HCC)    Headache    HYPERLIPIDEMIA 09/26/2008   IBS (irritable bowel syndrome)    OTITIS MEDIA, LEFT 01/14/2009   SKIN LESION  09/26/2008   Sleep apnea    TONSILLITIS, ACUTE 01/14/2009   Unconscious state (HCC) 10/13/2017   URI 10/22/2009    Past Surgical History:  Procedure Laterality Date   CERVICAL POLYPECTOMY  2009   CERVICAL POLYPECTOMY  2019   DILATATION & CURETTAGE/HYSTEROSCOPY WITH MYOSURE N/A 11/17/2017   Procedure: DILATATION & CURETTAGE/HYSTEROSCOPY WITH MYOSURE;  Surgeon: Geryl Rankins, MD;  Location: [facility];  Service: Gynecology;  Laterality: N/A;   ORIF ANKLE FRACTURE Right 08/24/2018   Procedure: OPEN REDUCTION INTERNAL FIXATION TRIMALLEOLAR ANKLE FRACTURE;  Surgeon: Eldred Manges, MD;  Location: [facility];  Service: Orthopedics;  Laterality: Right;    Family Psychiatric History: Brother depression and mother anxiety  Family History:  Family History  Problem Relation Age of Onset   Diabetes Other    Hypertension Other    Thyroid disease Mother    Hypertension Mother     Social History:  Social History   Socioeconomic History   Marital status: Single    Spouse name: Not on file   Number of children: Not on file   Years of education: Not on file   Highest education level: Not on file  Occupational History   Occupation: full time waitress  Tobacco Use   Smoking status: Every Day    Packs/day: 1.00    Years: 20.00    Total pack years: 20.00    Types: Cigarettes   Smokeless tobacco: Never  Vaping Use   Vaping Use: Former  Substance and Sexual Activity   Alcohol use: Not Currently   Drug use: No   Sexual activity: Not Currently    Birth control/protection: Abstinence  Other Topics Concern   Not on file  Social History Narrative   Lives with mother   Social Determinants of Health   Financial Resource Strain: Not on file  Food Insecurity: Not on file  Transportation Needs: Not on file  Physical Activity: Not on file  Stress: Not on file  Social Connections: Not on file    Allergies: No Known Allergies  Metabolic Disorder Labs: Lab Results  Component Value Date   HGBA1C  5.3 06/22/2020   MPG 105 06/22/2020   MPG 108 07/25/2019   No results found for: "PROLACTIN" Lab Results  Component Value Date   CHOL 148 06/22/2020   TRIG 111 06/22/2020   HDL 46 06/22/2020   CHOLHDL 3.2 06/22/2020   VLDL 22 06/22/2020   LDLCALC 80 06/22/2020   LDLCALC 80 07/25/2019   Lab Results  Component Value Date   TSH 4.263 06/22/2020   TSH 3.023 07/25/2019    Therapeutic Level Labs: No results found for: "LITHIUM" No results found for: "VALPROATE" Lab Results  Component Value Date   CBMZ 6.8 07/29/2019    Current Medications: Current Outpatient Medications  Medication Sig Dispense Refill   atomoxetine (STRATTERA) 80 MG capsule Take 1 capsule (80 mg total) by mouth daily. 30 capsule 3   escitalopram (LEXAPRO) 10 MG tablet Take 1 tablet (10 mg total) by mouth  once daily. 30 tablet 3   hydrOXYzine (ATARAX) 25 MG tablet Take 1 tablet (25 mg total) by mouth 3 (three) times daily as needed for anxiety. 90 tablet 3   nicotine polacrilex (NICORETTE) 2 MG gum Take 1 each (2 mg total) by mouth as needed for smoking cessation. 100 tablet 3   OLANZapine zydis (ZYPREXA) 20 MG disintegrating tablet Take 1 tablet (20 mg total) by mouth at bedtime. 30 tablet 3   traZODone (DESYREL) 50 MG tablet Take 1 tablet (50 mg total) by mouth at bedtime as needed for sleep. 30 tablet 3   No current facility-administered medications for this visit.     Musculoskeletal: Strength & Muscle Tone: within normal limits Gait & Station: normal Patient leans: N/A  Psychiatric Specialty Exam: Review of Systems  There were no vitals taken for this visit.There is no height or weight on file to calculate BMI.  General Appearance: Well Groomed  Eye Contact:  Good  Speech:  Clear and Coherent and Normal Rate  Volume:  Normal  Mood:  Anxious and Depressed  Affect:  Appropriate and Congruent  Thought Process:  Coherent, Goal Directed, and Linear  Orientation:  Full (Time, Place, and Person)   Thought Content: WDL and Logical   Suicidal Thoughts:  No  Homicidal Thoughts:  No  Memory:  Immediate;   Good Recent;   Good Remote;   Good  Judgement:  Good  Insight:  Good  Psychomotor Activity:  Normal  Concentration:  Concentration: Fair and Attention Span: Fair  Recall:  Good  Fund of Knowledge: Good  Language: Good  Akathisia:  No  Handed:  Right  AIMS (if indicated): not done  Assets:  Communication Skills Desire for Improvement Financial Resources/Insurance Housing Intimacy Physical Health Social Support  ADL's:  Intact  Cognition: WNL  Sleep:  Fair   Screenings: AIMS    Flowsheet Row [facility] Admission (Discharged) [facility] Admission (Discharged) [facility] Admission (Discharged) [facility]  AIMS Total Score 0 0 0 0      AUDIT    Flowsheet Row Admission (Discharged) from [facility] Admission (Discharged) [facility] Admission (Discharged) from [facility] Admission (Discharged) [facility]  Alcohol Use Disorder Identification Test Final Score (AUDIT) 0 0 4 0      GAD-7    Flowsheet Row Office Visit from 09/10/2021 in [facility] Clinical Support from 03/03/2021 in [facility] Video Visit from 12/05/2020 in [facility] Counselor from 10/02/2020 in [facility] Clinical Support from 09/04/2020 in [facility]  Total GAD-7 Score 9 12 8 12 21          Flowsheet Row Office  Visit from 09/10/2021 in [facility] Clinical Support from 03/03/2021 in [facility] Video Visit from 12/05/2020 in [facility] Counselor from 10/02/2020 in [facility] Clinical Support from 09/04/2020 in [facility]  PHQ-2 Total Score 1 4 5 3 1   PHQ-9 Total Score 14 19 15 13  18  Flowsheet Row Clinical Support from 09/04/2020 in [facility] Counselor from 07/24/2020 in [facility] Admission (Discharged) from [facility]  C-SSRS RISK CATEGORY No Risk No Risk Moderate Risk        Assessment and Plan: Patient reports that she is doing well on her current medication regimen however endorses fatigue and hypersomnia. No medication changes made today.  Patient agreeable to continue medications as prescribed.  She will follow-up with her PCP to reevaluate fatigue.  1. Attention deficit hyperactivity disorder (ADHD), predominantly inattentive type  Continue- atomoxetine (STRATTERA) 80 MG capsule; Take 1 capsule (80 mg total) by mouth daily.  Dispense: 30 capsule; Refill: 3  2. Bipolar 1 disorder (HCC)  Continue- escitalopram (LEXAPRO) 10 MG tablet; Take 1 tablet (10 mg total) by mouth once daily.  Dispense: 30 tablet; Refill: 3 Continue- OLANZapine zydis (ZYPREXA) 20 MG disintegrating tablet; Take 1 tablet (20 mg total) by mouth at bedtime.  Dispense: 30 tablet; Refill: 3 Continue- traZODone (DESYREL) 50 MG tablet; Take 1 tablet (50 mg total) by mouth at bedtime as needed for sleep.  Dispense: 30 tablet; Refill: 3  3. Generalized anxiety disorder  Continue- escitalopram (LEXAPRO) 10 MG tablet; Take 1 tablet (10 mg total) by mouth once daily.  Dispense: 30 tablet; Refill: 3 Continue- hydrOXYzine (ATARAX) 25 MG tablet; Take 1 tablet (25 mg  total) by mouth 3 (three) times daily as needed for anxiety.  Dispense: 90 tablet; Refill: 3  4. Tobacco dependence  - nicotine polacrilex (NICORETTE) 2 MG gum; Take 1 each (2 mg total) by mouth as needed for smoking cessation.  Dispense: 100 tablet; Refill: 3   Follow up in 3 months Follow up with therapy  Shanna Cisco, NP 09/10/2021, 10:50 AM

## 2021-09-19 ENCOUNTER — Other Ambulatory Visit: Payer: Self-pay

## 2021-10-31 ENCOUNTER — Other Ambulatory Visit: Payer: Self-pay

## 2021-11-14 ENCOUNTER — Other Ambulatory Visit: Payer: Self-pay

## 2021-11-14 ENCOUNTER — Other Ambulatory Visit (HOSPITAL_COMMUNITY): Payer: Self-pay | Admitting: Psychiatry

## 2021-11-14 ENCOUNTER — Other Ambulatory Visit (HOSPITAL_COMMUNITY): Payer: Self-pay

## 2021-11-14 ENCOUNTER — Telehealth (HOSPITAL_COMMUNITY): Payer: Self-pay | Admitting: Psychiatry

## 2021-11-14 ENCOUNTER — Telehealth: Payer: Self-pay

## 2021-11-14 MED ORDER — OLANZAPINE 10 MG PO TABS: 20.0000 mg | ORAL_TABLET | Freq: Once | ORAL | Status: DC

## 2021-11-14 MED ORDER — OLANZAPINE 20 MG PO TABS
20.0000 mg | ORAL_TABLET | Freq: Every day | ORAL | 3 refills | Status: DC
  Filled 2021-11-14: qty 30, 30d supply, fill #0

## 2021-11-14 NOTE — Telephone Encounter (Signed)
Medication refilled and sent to preferred pharmacy

## 2021-11-14 NOTE — Telephone Encounter (Signed)
Patient is requesting a refill for Zyprexa Zydis and her patient assistance is expired.  Can she be changed to plain Zyprexa tablets?  She hasn't picked up since April.  If yes, can you send a new script please?

## 2021-11-17 NOTE — Telephone Encounter (Signed)
Provider attempted to call patient 3 times without success.  Voicemail was left.  Provider did speak to [facility] staff last week about patient Zyprexa refill.  Medication adjustments were made and sent to preferred pharmacy.  Provider informed patient that if other questions or concerns are needed regarding refills to give the clinic a call.

## 2021-12-03 ENCOUNTER — Ambulatory Visit (INDEPENDENT_AMBULATORY_CARE_PROVIDER_SITE_OTHER): Payer: No Payment, Other | Admitting: Psychiatry

## 2021-12-03 ENCOUNTER — Encounter (HOSPITAL_COMMUNITY): Payer: Self-pay | Admitting: Psychiatry

## 2021-12-03 ENCOUNTER — Other Ambulatory Visit: Payer: Self-pay

## 2021-12-03 DIAGNOSIS — F900 Attention-deficit hyperactivity disorder, predominantly inattentive type: Secondary | ICD-10-CM

## 2021-12-03 DIAGNOSIS — F319 Bipolar disorder, unspecified: Secondary | ICD-10-CM | POA: Diagnosis not present

## 2021-12-03 DIAGNOSIS — F17200 Nicotine dependence, unspecified, uncomplicated: Secondary | ICD-10-CM

## 2021-12-03 DIAGNOSIS — F411 Generalized anxiety disorder: Secondary | ICD-10-CM | POA: Diagnosis not present

## 2021-12-03 MED ORDER — NICOTINE POLACRILEX 2 MG MT GUM
2.0000 mg | CHEWING_GUM | OROMUCOSAL | 3 refills | Status: DC | PRN
  Filled 2021-12-03: qty 100, 30d supply, fill #0

## 2021-12-03 MED ORDER — ATOMOXETINE HCL 80 MG PO CAPS
80.0000 mg | ORAL_CAPSULE | Freq: Every day | ORAL | 3 refills | Status: DC
  Filled 2021-12-03: qty 30, 30d supply, fill #0

## 2021-12-03 MED ORDER — TRAZODONE HCL 50 MG PO TABS
50.0000 mg | ORAL_TABLET | Freq: Every evening | ORAL | 3 refills | Status: DC | PRN
  Filled 2021-12-03: qty 30, 30d supply, fill #0

## 2021-12-03 MED ORDER — ESCITALOPRAM OXALATE 10 MG PO TABS
10.0000 mg | ORAL_TABLET | Freq: Every day | ORAL | 3 refills | Status: DC
  Filled 2021-12-03: qty 13, 13d supply, fill #0
  Filled 2021-12-03: qty 17, 17d supply, fill #0
  Filled 2022-01-21: qty 30, 30d supply, fill #1

## 2021-12-03 MED ORDER — HYDROXYZINE HCL 25 MG PO TABS
25.0000 mg | ORAL_TABLET | Freq: Three times a day (TID) | ORAL | 3 refills | Status: DC | PRN
  Filled 2021-12-03: qty 90, 30d supply, fill #0

## 2021-12-03 MED ORDER — OLANZAPINE 20 MG PO TABS
20.0000 mg | ORAL_TABLET | Freq: Every day | ORAL | 3 refills | Status: DC
  Filled 2021-12-03 – 2021-12-19 (×2): qty 30, 30d supply, fill #0
  Filled 2022-01-21: qty 30, 30d supply, fill #1

## 2021-12-03 NOTE — Progress Notes (Signed)
BH MD/PA/NP OP Progress Note Virtual Visit via Telephone Note  I connected with [name] on 12/03/21 at  3:00 PM EST by telephone and verified that I am speaking with the correct person using two identifiers.  Location: Patient: home Provider: Clinic   I discussed the limitations, risks, security and privacy concerns of performing an evaluation and management service by telephone and the availability of in person appointments. I also discussed with the patient that there may be a patient responsible charge related to this service. The patient expressed understanding and agreed to proceed.   I provided 30 minutes of non-face-to-face time during this encounter.   12/03/2021 11:19 AM [name]  MRN:  [id number]  Chief Complaint: "Im doing good"    HPI: 41 year old female seen today for follow up psychiatric evaluation.    She has psychiatric history of depression, ADD, bipolar 1 disorder, and anxiety. She is currently managed on Lexapro 10 mg daily, hydroxyzine 25 mg three time daily, Zyprexa 20mg  nightly, NicoDerm 2 mg gum, and trazodone 50mg  nightly as needed. She notes her medications are effective in managing her psychiatric conditions.     Today she  was able to logon virtually sources was done over the phone.  During exam she was pleasant, cooperative, and engaged in conversation.  She informed that she is doing well. She notes that not much has changes since her last visit.  Patient does note her anxiety and depression has improved.  Provider conducted a GAD-7 and patient scored a 5, at her last visit she scored a 9.  Provider also conducted a PHQ 9 and patient scored an 11, at her last visit she scored a 14.  She endorses adequate sleep and appetite.  Today she denies SI/HI/VAH, mania, or paranoia.  No medication changes made today.  Patient agreeable to continue medications as prescribed.  She will follow-up with outpatient counseling for therapy.  No other concerns  at this time.    .    Visit Diagnosis:    ICD-10-CM   1. Attention deficit hyperactivity disorder (ADHD), predominantly inattentive type  F90.0 atomoxetine (STRATTERA) 80 MG capsule    2. Generalized anxiety disorder  F41.1 escitalopram (LEXAPRO) 10 MG tablet    hydrOXYzine (ATARAX) 25 MG tablet    3. Bipolar 1 disorder (HCC)  F31.9 escitalopram (LEXAPRO) 10 MG tablet    traZODone (DESYREL) 50 MG tablet    4. Tobacco dependence  F17.200 nicotine polacrilex (NICORETTE) 2 MG gum      Past Psychiatric History:  depression, ADD, bipolar 1 disorder, and anxiety  Past Medical History:  Past Medical History:  Diagnosis Date   ADD 10/22/2009   ALLERGIC RHINITIS 09/26/2008   Anemia    ANXIETY 09/26/2008   Asthma    Complication of anesthesia    "tempermental" per patient, "aggiated and hostile"   CONSTIPATION 09/26/2008   DEPRESSION 09/26/2008   FATIGUE 01/14/2009   Head injury with loss of consciousness (HCC)    Headache    HYPERLIPIDEMIA 09/26/2008   IBS (irritable bowel syndrome)    OTITIS MEDIA, LEFT 01/14/2009   SKIN LESION 09/26/2008   Sleep apnea    TONSILLITIS, ACUTE 01/14/2009   Unconscious state (HCC) 10/13/2017   URI 10/22/2009    Past Surgical History:  Procedure Laterality Date   CERVICAL POLYPECTOMY  2009   CERVICAL POLYPECTOMY  2019   DILATATION & CURETTAGE/HYSTEROSCOPY WITH MYOSURE N/A 11/17/2017   Procedure: DILATATION & CURETTAGE/HYSTEROSCOPY WITH MYOSURE;  Surgeon: 2020,  Renea Ee, MD;  Location: [facility];  Service: Gynecology;  Laterality: N/A;   ORIF ANKLE FRACTURE Right 08/24/2018   Procedure: OPEN REDUCTION INTERNAL FIXATION TRIMALLEOLAR ANKLE FRACTURE;  Surgeon: Eldred Manges, MD;  Location: [facility];  Service: Orthopedics;  Laterality: Right;    Family Psychiatric History: Brother depression and mother anxiety  Family History:  Family History  Problem Relation Age of Onset   Diabetes Other    Hypertension Other    Thyroid disease Mother    Hypertension Mother      Social History:  Social History   Socioeconomic History   Marital status: Single    Spouse name: Not on file   Number of children: Not on file   Years of education: Not on file   Highest education level: Not on file  Occupational History   Occupation: full time waitress  Tobacco Use   Smoking status: Every Day    Packs/day: 1.00    Years: 20.00    Total pack years: 20.00    Types: Cigarettes   Smokeless tobacco: Never  Vaping Use   Vaping Use: Former  Substance and Sexual Activity   Alcohol use: Not Currently   Drug use: No   Sexual activity: Not Currently    Birth control/protection: Abstinence  Other Topics Concern   Not on file  Social History Narrative   Lives with mother   Social Determinants of Health   Financial Resource Strain: Not on file  Food Insecurity: Not on file  Transportation Needs: Not on file  Physical Activity: Not on file  Stress: Not on file  Social Connections: Not on file    Allergies: No Known Allergies  Metabolic Disorder Labs: Lab Results  Component Value Date   HGBA1C 5.3 06/22/2020   MPG 105 06/22/2020   MPG 108 07/25/2019   No results found for: "PROLACTIN" Lab Results  Component Value Date   CHOL 148 06/22/2020   TRIG 111 06/22/2020   HDL 46 06/22/2020   CHOLHDL 3.2 06/22/2020   VLDL 22 06/22/2020   LDLCALC 80 06/22/2020   LDLCALC 80 07/25/2019   Lab Results  Component Value Date   TSH 4.263 06/22/2020   TSH 3.023 07/25/2019    Therapeutic Level Labs: No results found for: "LITHIUM" No results found for: "VALPROATE" Lab Results  Component Value Date   CBMZ 6.8 07/29/2019    Current Medications: Current Outpatient Medications  Medication Sig Dispense Refill   atomoxetine (STRATTERA) 80 MG capsule Take 1 capsule (80 mg total) by mouth daily. 30 capsule 3   escitalopram (LEXAPRO) 10 MG tablet Take 1 tablet (10 mg total) by mouth once daily. 30 tablet 3   hydrOXYzine (ATARAX) 25 MG tablet Take 1 tablet  (25 mg total) by mouth 3 (three) times daily as needed for anxiety. 90 tablet 3   nicotine polacrilex (NICORETTE) 2 MG gum Take 1 each (2 mg total) by mouth as needed for smoking cessation. 100 tablet 3   OLANZapine (ZYPREXA) 20 MG tablet Take 1 tablet (20 mg total) by mouth at bedtime. 30 tablet 3   traZODone (DESYREL) 50 MG tablet Take 1 tablet (50 mg total) by mouth at bedtime as needed for sleep. 30 tablet 3   No current facility-administered medications for this visit.     Musculoskeletal: Strength & Muscle Tone:  Unable to assess due to telephone visit Gait & Station:  Unable to assess due to telephone visit Patient leans: N/A  Psychiatric Specialty Exam: Review of Systems  There were no vitals taken for this visit.There is no height or weight on file to calculate BMI.  General Appearance:  Unable to assess due to telephone visit  Eye Contact:   Unable to assess due to telephone visit  Speech:  Clear and Coherent and Normal Rate  Volume:  Normal  Mood:  Euthymic  Affect:  Appropriate and Congruent  Thought Process:  Coherent, Goal Directed, and Linear  Orientation:  Full (Time, Place, and Person)  Thought Content: WDL and Logical   Suicidal Thoughts:  No  Homicidal Thoughts:  No  Memory:  Immediate;   Good Recent;   Good Remote;   Good  Judgement:  Good  Insight:  Good  Psychomotor Activity:  Normal  Concentration:  Concentration: Good and Attention Span: Good  Recall:  Good  Fund of Knowledge: Good  Language: Good  Akathisia:  No  Handed:  Right  AIMS (if indicated): not done  Assets:  Communication Skills Desire for Improvement Financial Resources/Insurance Housing Intimacy Physical Health Social Support  ADL's:  Intact  Cognition: WNL  Sleep:  Good   Screenings: AIMS    Flowsheet Row [facility] (Dis[facility] Ad[facility] (Dis[facility]) [facility]  AIMS Total Score 0 0 0 0      AUDIT    Flowsheet Row Admission (Discharged) from [facility] Ad[facility] (Dis[facility]) [facility] Ad[facility] (Dis[facility] Ad[facility] (Dis[facility]) [facility]  Alcohol Use Disorder Identification Test Final Score (AUDIT) 0 0 4 0      GAD-7    Flowsheet Row Office Visit from 12/03/2021 in [facility] Office Visit from 09/10/2021 in [facility] Clinical Support from 03/03/2021 in [facility] Video Visit from 12/05/2020 in [facility] Counselor from 10/02/2020 in [facility]  Total GAD-7 Score 5 9 12 8 12       PHQ2-9    Flowsheet Row Office Visit from 12/03/2021 in [facility] Office Visit from 09/10/2021 in [facility] Clinical Support from 03/03/2021 in [facility] Video Visit from 12/05/2020 in [facility] Counselor from 10/02/2020 in [facility]  PHQ-2 Total Score 1 1 4 5 3   PHQ-9 Total Score 11 14 19 15 13       Flowsheet Row Clinical Support from 09/04/2020 in [facility] Counselor from 07/24/2020 in [facility] Admission (Discharged) from [facility]  C-SSRS RISK CATEGORY No Risk No Risk Moderate Risk        Assessment and Plan: Patient reports that she is doing  well on her current medication regimen.  No medication changes made today.  Patient agreeable to continue medications as prescribed.   1. Attention deficit hyperactivity disorder (ADHD), predominantly inattentive type  Continue- atomoxetine (STRATTERA) 80 MG capsule; Take 1 capsule (80 mg total) by mouth daily.  Dispense: 30 capsule; Refill: 3  2. Bipolar 1 disorder (HCC)  Continue- escitalopram (LEXAPRO) 10 MG  tablet; Take 1 tablet (10 mg total) by mouth once daily.  Dispense: 30 tablet; Refill: 3 Continue- OLANZapine zydis (ZYPREXA) 20 MG disintegrating tablet; Take 1 tablet (20 mg total) by mouth at bedtime.  Dispense: 30 tablet; Refill: 3 Continue- traZODone (DESYREL) 50 MG tablet; Take 1 tablet (50 mg total) by mouth at bedtime as needed for sleep.  Dispense: 30 tablet; Refill: 3  3. Generalized anxiety disorder  Continue- escitalopram (LEXAPRO) 10 MG tablet; Take 1 tablet (10 mg total) by mouth once daily.  Dispense: 30 tablet; Refill: 3 Continue- hydrOXYzine (ATARAX) 25 MG tablet; Take 1 tablet (25 mg total) by mouth 3 (three) times daily as needed for anxiety.  Dispense: 90 tablet; Refill: 3  4. Tobacco dependence  - nicotine polacrilex (NICORETTE) 2 MG gum; Take 1 each (2 mg total) by mouth as needed for smoking cessation.  Dispense: 100 tablet; Refill: 3   Follow up in 3 months Follow up with therapy  Shanna Cisco, NP 12/03/2021, 11:19 AM

## 2021-12-11 ENCOUNTER — Other Ambulatory Visit: Payer: Self-pay

## 2021-12-19 ENCOUNTER — Other Ambulatory Visit: Payer: Self-pay

## 2022-01-13 ENCOUNTER — Ambulatory Visit (INDEPENDENT_AMBULATORY_CARE_PROVIDER_SITE_OTHER): Payer: Self-pay | Admitting: Internal Medicine

## 2022-01-13 ENCOUNTER — Telehealth: Payer: Self-pay

## 2022-01-13 ENCOUNTER — Other Ambulatory Visit: Payer: Self-pay

## 2022-01-13 VITALS — BP 122/70 | HR 97 | Temp 98.3°F | Ht 69.0 in | Wt 330.0 lb

## 2022-01-13 DIAGNOSIS — R635 Abnormal weight gain: Secondary | ICD-10-CM | POA: Insufficient documentation

## 2022-01-13 DIAGNOSIS — E559 Vitamin D deficiency, unspecified: Secondary | ICD-10-CM

## 2022-01-13 DIAGNOSIS — R062 Wheezing: Secondary | ICD-10-CM

## 2022-01-13 DIAGNOSIS — E538 Deficiency of other specified B group vitamins: Secondary | ICD-10-CM

## 2022-01-13 DIAGNOSIS — R739 Hyperglycemia, unspecified: Secondary | ICD-10-CM

## 2022-01-13 DIAGNOSIS — J069 Acute upper respiratory infection, unspecified: Secondary | ICD-10-CM

## 2022-01-13 DIAGNOSIS — E785 Hyperlipidemia, unspecified: Secondary | ICD-10-CM

## 2022-01-13 MED ORDER — ALBUTEROL SULFATE HFA 108 (90 BASE) MCG/ACT IN AERS
2.0000 | INHALATION_SPRAY | Freq: Four times a day (QID) | RESPIRATORY_TRACT | 0 refills | Status: DC | PRN
  Filled 2022-01-13: qty 6.7, 25d supply, fill #0

## 2022-01-13 MED ORDER — PREDNISONE 10 MG PO TABS
ORAL_TABLET | ORAL | 0 refills | Status: AC
  Filled 2022-01-13: qty 18, 9d supply, fill #0

## 2022-01-13 MED ORDER — LEVOFLOXACIN 500 MG PO TABS
500.0000 mg | ORAL_TABLET | Freq: Every day | ORAL | 0 refills | Status: DC
  Filled 2022-01-13: qty 10, 10d supply, fill #0

## 2022-01-13 MED ORDER — HYDROCODONE BIT-HOMATROP MBR 5-1.5 MG/5ML PO SOLN
5.0000 mL | Freq: Four times a day (QID) | ORAL | 0 refills | Status: DC | PRN
  Filled 2022-01-13: qty 180, 9d supply, fill #0

## 2022-01-13 NOTE — Telephone Encounter (Signed)
Hycodan is on back order and [employer] [facility] and the [facility] on [address] does not have the full quantity to dispense. Please send to

## 2022-01-13 NOTE — Progress Notes (Signed)
Patient ID: [name], female   DOB: Jan 20, 1981, 41 y.o.   MRN: [id number]        Chief Complaint: follow up cough, wheezing       HPI:  [name] is a 41 y.o. female  Here with acute onset mild to mod 4 days ST, HA, general weakness and malaise, with prod cough greenish sputum with mild wheezing and sob doe, but Pt denies chest pain, orthopnea, PND, increased LE swelling, palpitations, dizziness or syncope.   Pt denies polydipsia, polyuria, or new focal neuro s/s.    Pt denies fever, wt loss, night sweats, loss of appetite, or other constitutional symptoms         Wt Readings from Last 3 Encounters:  01/13/22 (!) 330 lb (149.7 kg)  06/21/20 220 lb (99.8 kg)  11/29/18 225 lb (102.1 kg)   BP Readings from Last 3 Encounters:  01/13/22 122/70  06/21/20 (!) 148/91  07/24/19 140/76         Past Medical History:  Diagnosis Date   ADD 10/22/2009   ALLERGIC RHINITIS 09/26/2008   Anemia    ANXIETY 09/26/2008   Asthma    Complication of anesthesia    "tempermental" per patient, "aggiated and hostile"   CONSTIPATION 09/26/2008   DEPRESSION 09/26/2008   FATIGUE 01/14/2009   Head injury with loss of consciousness (HCC)    Headache    HYPERLIPIDEMIA 09/26/2008   IBS (irritable bowel syndrome)    OTITIS MEDIA, LEFT 01/14/2009   SKIN LESION 09/26/2008   Sleep apnea    TONSILLITIS, ACUTE 01/14/2009   Unconscious state (HCC) 10/13/2017   URI 10/22/2009   Past Surgical History:  Procedure Laterality Date   CERVICAL POLYPECTOMY  2009   CERVICAL POLYPECTOMY  2019   DILATATION & CURETTAGE/HYSTEROSCOPY WITH MYOSURE N/A 11/17/2017   Procedure: DILATATION & CURETTAGE/HYSTEROSCOPY WITH MYOSURE;  Surgeon: Geryl Rankins, MD;  Location: [facility];  Service: Gynecology;  Laterality: N/A;   ORIF ANKLE FRACTURE Right 08/24/2018   Procedure: OPEN REDUCTION INTERNAL FIXATION TRIMALLEOLAR ANKLE FRACTURE;  Surgeon: Eldred Manges, MD;  Location: [facility];  Service: Orthopedics;  Laterality: Right;    reports  that she has been smoking cigarettes. She has a 20.00 pack-year smoking history. She has never used smokeless tobacco. She reports that she does not currently use alcohol. She reports that she does not use drugs. family history includes Diabetes in an other family member; Hypertension in her mother and another family member; Thyroid disease in her mother. No Known Allergies Current Outpatient Medications on File Prior to Visit  Medication Sig Dispense Refill   atomoxetine (STRATTERA) 80 MG capsule Take 1 capsule (80 mg total) by mouth daily. 30 capsule 3   escitalopram (LEXAPRO) 10 MG tablet Take 1 tablet (10 mg total) by mouth once daily. 30 tablet 3   hydrOXYzine (ATARAX) 25 MG tablet Take 1 tablet (25 mg total) by mouth 3 (three) times daily as needed for anxiety. 90 tablet 3   nicotine polacrilex (NICORETTE) 2 MG gum Take 1 each (2 mg total) by mouth as needed for smoking cessation. 100 tablet 3   OLANZapine (ZYPREXA) 20 MG tablet Take 1 tablet (20 mg total) by mouth at bedtime. 30 tablet 3   traZODone (DESYREL) 50 MG tablet Take 1 tablet (50 mg total) by mouth at bedtime as needed for sleep. 30 tablet 3   No current facility-administered medications on file prior to visit.        ROS:  All others reviewed and negative.  Objective        PE:  BP 122/70 (BP Location: Right Arm, Patient Position: Sitting, Cuff Size: Large)   Pulse 97   Temp 98.3 F (36.8 C) (Oral)   Ht 5\' 9"  (1.753 m)   Wt (!) 330 lb (149.7 kg)   SpO2 97%   BMI 48.73 kg/m                 Constitutional: Pt appears in NAD               HENT: Head: NCAT.                Right Ear: External ear normal.                 Left Ear: External ear normal.                Eyes: . Pupils are equal, round, and reactive to light. Conjunctivae and EOM are normal               Nose: without d/c or deformity               Neck: Neck supple. Gross normal ROM               Cardiovascular: Normal rate and regular rhythm.                  Pulmonary/Chest: Effort normal and breath sounds without rales or wheezing.                Abd:  Soft, NT, ND, + BS, no organomegaly               Neurological: Pt is alert. At baseline orientation, motor grossly intact               Skin: Skin is warm. No rashes, no other new lesions, LE edema - none               Psychiatric: Pt behavior is normal without agitation   Micro: none  Cardiac tracings I have personally interpreted today:  none  Pertinent Radiological findings (summarize): none   Lab Results  Component Value Date   WBC 11.0 (H) 06/22/2020   HGB 13.2 06/22/2020   HCT 42.1 06/22/2020   PLT 297 06/22/2020   GLUCOSE 131 (H) 06/23/2020   CHOL 148 06/22/2020   TRIG 111 06/22/2020   HDL 46 06/22/2020   LDLCALC 80 06/22/2020   ALT 68 (H) 06/23/2020   AST 40 06/23/2020   NA 136 06/23/2020   K 3.8 06/23/2020   CL 104 06/23/2020   CREATININE 0.75 06/23/2020   BUN 7 06/23/2020   CO2 24 06/23/2020   TSH 4.263 06/22/2020   HGBA1C 5.3 06/22/2020   Assessment/Plan:  [name] is a 41 y.o. White or Caucasian [1] female with  has a past medical history of ADD (10/22/2009), ALLERGIC RHINITIS (09/26/2008), Anemia, ANXIETY (09/26/2008), Asthma, Complication of anesthesia, CONSTIPATION (09/26/2008), DEPRESSION (09/26/2008), FATIGUE (01/14/2009), Head injury with loss of consciousness (HCC), Headache, HYPERLIPIDEMIA (09/26/2008), IBS (irritable bowel syndrome), OTITIS MEDIA, LEFT (01/14/2009), SKIN LESION (09/26/2008), Sleep apnea, TONSILLITIS, ACUTE (01/14/2009), Unconscious state (HCC) (10/13/2017), and URI (10/22/2009).  No problem-specific Assessment & Plan notes found for this encounter.  Followup: No follow-ups on file.  10/24/2009, MD 01/13/2022 3:23 PM [facility]

## 2022-01-13 NOTE — Assessment & Plan Note (Signed)
Lab Results  Component Value Date   HGBA1C 5.3 06/22/2020   Stable, pt to continue current medical treatment  - diet, wt control, exercise, and f/u A1c with next labs

## 2022-01-13 NOTE — Telephone Encounter (Signed)
[facility] to [facility] [address] [place]

## 2022-01-13 NOTE — Patient Instructions (Addendum)
Please take all new medication as prescribed - the antibiotic, cough medicine, prednisone, and inhaler  Please continue all other medications as before, and refills have been done if requested.  Please have the pharmacy call with any other refills you may need.  Please keep your appointments with your specialists as you may have planned  Please make an Appointment to return in 3 months, or sooner if needed, also with Lab Appointment for testing done 3-5 days before at the FIRST FLOOR Lab (so this is for TWO appointments - please see the scheduling desk as you leave)

## 2022-01-13 NOTE — Assessment & Plan Note (Signed)
With infection also lower resp involved - for prednisone taper, inhaler prn,  to f/u any worsening symptoms or concerns

## 2022-01-13 NOTE — Assessment & Plan Note (Signed)
Mild to mod, for antibx course, cough med prn, to f/u any worsening symptoms or concerns 

## 2022-01-14 MED ORDER — HYDROCODONE BIT-HOMATROP MBR 5-1.5 MG/5ML PO SOLN: 5.0000 mL | Freq: Four times a day (QID) | ORAL | 0 refills | Status: AC | PRN

## 2022-01-14 NOTE — Addendum Note (Signed)
Addended by: Corwin Levins on: 01/14/2022 12:02 PM   Modules accepted: Orders

## 2022-01-14 NOTE — Telephone Encounter (Signed)
Patient called back to follow up on this, [facility] [address]

## 2022-01-14 NOTE — Telephone Encounter (Signed)
Ok done erx 

## 2022-01-21 ENCOUNTER — Other Ambulatory Visit: Payer: Self-pay

## 2022-01-22 ENCOUNTER — Other Ambulatory Visit: Payer: Self-pay

## 2022-03-02 ENCOUNTER — Ambulatory Visit (INDEPENDENT_AMBULATORY_CARE_PROVIDER_SITE_OTHER): Payer: 59 | Admitting: Internal Medicine

## 2022-03-02 ENCOUNTER — Encounter: Payer: Self-pay | Admitting: Internal Medicine

## 2022-03-02 ENCOUNTER — Other Ambulatory Visit: Payer: Self-pay

## 2022-03-02 VITALS — BP 128/74 | HR 89 | Temp 98.1°F | Ht 69.0 in | Wt 330.0 lb

## 2022-03-02 DIAGNOSIS — E559 Vitamin D deficiency, unspecified: Secondary | ICD-10-CM

## 2022-03-02 DIAGNOSIS — Z0001 Encounter for general adult medical examination with abnormal findings: Secondary | ICD-10-CM

## 2022-03-02 DIAGNOSIS — R635 Abnormal weight gain: Secondary | ICD-10-CM

## 2022-03-02 DIAGNOSIS — R198 Other specified symptoms and signs involving the digestive system and abdomen: Secondary | ICD-10-CM | POA: Diagnosis not present

## 2022-03-02 DIAGNOSIS — E785 Hyperlipidemia, unspecified: Secondary | ICD-10-CM

## 2022-03-02 DIAGNOSIS — R739 Hyperglycemia, unspecified: Secondary | ICD-10-CM

## 2022-03-02 DIAGNOSIS — R7303 Prediabetes: Secondary | ICD-10-CM

## 2022-03-02 DIAGNOSIS — R7989 Other specified abnormal findings of blood chemistry: Secondary | ICD-10-CM | POA: Diagnosis not present

## 2022-03-02 DIAGNOSIS — E538 Deficiency of other specified B group vitamins: Secondary | ICD-10-CM | POA: Diagnosis not present

## 2022-03-02 DIAGNOSIS — E7800 Pure hypercholesterolemia, unspecified: Secondary | ICD-10-CM

## 2022-03-02 MED ORDER — TIRZEPATIDE 2.5 MG/0.5ML ~~LOC~~ SOAJ
2.5000 mg | SUBCUTANEOUS | 11 refills | Status: DC
  Filled 2022-03-02: qty 2, 28d supply, fill #0

## 2022-03-02 MED ORDER — DICYCLOMINE HCL 10 MG PO CAPS
10.0000 mg | ORAL_CAPSULE | Freq: Three times a day (TID) | ORAL | 2 refills | Status: DC
  Filled 2022-03-02: qty 90, 23d supply, fill #0

## 2022-03-02 NOTE — Assessment & Plan Note (Signed)
Pt also for TSH with labs

## 2022-03-02 NOTE — Assessment & Plan Note (Signed)
Lab Results  Component Value Date   LDLCALC 80 06/22/2020   Stable, pt to continue current low chol diet, declines statin for now

## 2022-03-02 NOTE — Assessment & Plan Note (Signed)
Age and sex appropriate education and counseling updated with regular exercise and diet Referrals for preventative services - decilnes hep c screen Immunizations addressed - none needed Smoking counseling  - pt counsled to quit, pt not ready Evidence for depression or other mood disorder - none significant Most recent labs reviewed. I have personally reviewed and have noted: 1) the patient's medical and social history 2) The patient's current medications and supplements 3) The patient's height, weight, and BMI have been recorded in the chart

## 2022-03-02 NOTE — Progress Notes (Signed)
Patient ID: [name], female   DOB: 1980/01/31, 42 y.o.   MRN: [id number]         Chief Complaint:: wellness exam and Diarrhea (Possible reaction ) and Weight gain  , prediabetes, hld       HPI:  [name] is a 42 y.o. female here for wellness exam; decliens hep c screen, flu shot, covid booster o/w up to date.  Stll smoking, not ready to quit                        Also with > 3 mo onset alternating diarrhea constipatoin abd pains without fever, chills, or blood.  Asking for thyroid check.  Has fatigue, lo energy.  Pt denies chest pain, increased sob or doe, wheezing, orthopnea, PND, increased LE swelling, palpitations, dizziness or syncope.   Pt denies polydipsia, polyuria, or new focal neuro s/s.    Pt denies fever, wt loss, night sweats, loss of appetite, or other constitutional symptoms  Has gained significant wt.       Wt Readings from Last 3 Encounters:  03/02/22 (!) 330 lb (149.7 kg)  01/13/22 (!) 330 lb (149.7 kg)  06/21/20 220 lb (99.8 kg)   BP Readings from Last 3 Encounters:  03/02/22 128/74  01/13/22 122/70  06/21/20 (!) 148/91   Immunization History  Administered Date(s) Administered   Influenza,inj,Quad PF,6+ Mos 10/20/2016, 11/11/2017   Td 10/22/2009   Tdap 05/11/2015   There are no preventive care reminders to display for this patient.     Past Medical History:  Diagnosis Date   ADD 10/22/2009   ALLERGIC RHINITIS 09/26/2008   Anemia    ANXIETY 09/26/2008   Asthma    Complication of anesthesia    "tempermental" per patient, "aggiated and hostile"   CONSTIPATION 09/26/2008   DEPRESSION 09/26/2008   FATIGUE 01/14/2009   Head injury with loss of consciousness ([place])    Headache    HYPERLIPIDEMIA 09/26/2008   IBS (irritable bowel syndrome)    OTITIS MEDIA, LEFT 01/14/2009   SKIN LESION 09/26/2008   Sleep apnea    TONSILLITIS, ACUTE 01/14/2009   Unconscious state ([place]) 10/13/2017   URI 10/22/2009   Past Surgical History:  Procedure Laterality Date    CERVICAL POLYPECTOMY  2009   CERVICAL POLYPECTOMY  2019   DILATATION & CURETTAGE/HYSTEROSCOPY WITH MYOSURE N/A 11/17/2017   Procedure: [place];  Surgeon: Thurnell Lose, MD;  Location: [facility];  Service: Gynecology;  Laterality: N/A;   ORIF ANKLE FRACTURE Right 08/24/2018   Procedure: OPEN REDUCTION INTERNAL FIXATION TRIMALLEOLAR ANKLE FRACTURE;  Surgeon: Marybelle Killings, MD;  Location: [place];  Service: Orthopedics;  Laterality: Right;    reports that she has been smoking cigarettes. She has a 20.00 pack-year smoking history. She has never used smokeless tobacco. She reports that she does not currently use alcohol. She reports that she does not use drugs. family history includes Diabetes in an other family member; Hypertension in her mother and another family member; Thyroid disease in her mother. No Known Allergies Current Outpatient Medications on File Prior to Visit  Medication Sig Dispense Refill   albuterol (VENTOLIN HFA) 108 (90 Base) MCG/ACT inhaler Inhale 2 puffs into the lungs every 6 (six) hours as needed for wheezing or shortness of breath. 6.7 g 0   atomoxetine (STRATTERA) 80 MG capsule Take 1 capsule (80 mg total) by mouth daily. 30 capsule 3   escitalopram (LEXAPRO) 10 MG  tablet Take 1 tablet (10 mg total) by mouth once daily. 30 tablet 3   hydrOXYzine (ATARAX) 25 MG tablet Take 1 tablet (25 mg total) by mouth 3 (three) times daily as needed for anxiety. 90 tablet 3   levofloxacin (LEVAQUIN) 500 MG tablet Take 1 tablet (500 mg total) by mouth daily. 10 tablet 0   nicotine polacrilex (NICORETTE) 2 MG gum Take 1 each (2 mg total) by mouth as needed for smoking cessation. 100 tablet 3   OLANZapine (ZYPREXA) 20 MG tablet Take 1 tablet (20 mg total) by mouth at bedtime. 30 tablet 3   traZODone (DESYREL) 50 MG tablet Take 1 tablet (50 mg total) by mouth at bedtime as needed for sleep. 30 tablet 3   No current facility-administered medications on file  prior to visit.        ROS:  All others reviewed and negative.  Objective        PE:  BP 128/74 (BP Location: Right Arm, Patient Position: Sitting, Cuff Size: Large)   Pulse 89   Temp 98.1 F (36.7 C) (Oral)   Ht 5\' 9"  (1.753 m)   Wt (!) 330 lb (149.7 kg)   SpO2 95%   BMI 48.73 kg/m                 Constitutional: Pt appears in NAD               HENT: Head: NCAT.                Right Ear: External ear normal.                 Left Ear: External ear normal.                Eyes: . Pupils are equal, round, and reactive to light. Conjunctivae and EOM are normal               Nose: without d/c or deformity               Neck: Neck supple. Gross normal ROM               Cardiovascular: Normal rate and regular rhythm.                 Pulmonary/Chest: Effort normal and breath sounds without rales or wheezing.                Abd:  Soft, NT, ND, + BS, no organomegaly               Neurological: Pt is alert. At baseline orientation, motor grossly intact               Skin: Skin is warm. No rashes, no other new lesions, LE edema - none               Psychiatric: Pt behavior is normal without agitation   Micro: none  Cardiac tracings I have personally interpreted today:  none  Pertinent Radiological findings (summarize): none   Lab Results  Component Value Date   WBC 11.0 (H) 06/22/2020   HGB 13.2 06/22/2020   HCT 42.1 06/22/2020   PLT 297 06/22/2020   GLUCOSE 131 (H) 06/23/2020   CHOL 148 06/22/2020   TRIG 111 06/22/2020   HDL 46 06/22/2020   LDLCALC 80 06/22/2020   ALT 68 (H) 06/23/2020   AST 40 06/23/2020   NA 136 06/23/2020   K 3.8 06/23/2020  CL 104 06/23/2020   CREATININE 0.75 06/23/2020   BUN 7 06/23/2020   CO2 24 06/23/2020   TSH 4.263 06/22/2020   HGBA1C 5.3 06/22/2020   Assessment/Plan:  [name] is a 42 y.o. White or Caucasian [1] female with  has a past medical history of ADD (10/22/2009), ALLERGIC RHINITIS (09/26/2008), Anemia, ANXIETY (06/29/4032),  Asthma, Complication of anesthesia, CONSTIPATION (09/26/2008), DEPRESSION (09/26/2008), FATIGUE (01/14/2009), Head injury with loss of consciousness ([place]), Headache, HYPERLIPIDEMIA (09/26/2008), IBS (irritable bowel syndrome), OTITIS MEDIA, LEFT (01/14/2009), SKIN LESION (09/26/2008), Sleep apnea, TONSILLITIS, ACUTE (01/14/2009), Unconscious state ([place]) (10/13/2017), and URI (10/22/2009).  Encounter for well adult exam with abnormal findings Age and sex appropriate education and counseling updated with regular exercise and diet Referrals for preventative services - decilnes hep c screen Immunizations addressed - none needed Smoking counseling  - pt counsled to quit, pt not ready Evidence for depression or other mood disorder - none significant Most recent labs reviewed. I have personally reviewed and have noted: 1) the patient's medical and social history 2) The patient's current medications and supplements 3) The patient's height, weight, and BMI have been recorded in the chart   HLD (hyperlipidemia) Lab Results  Component Value Date   LDLCALC 80 06/22/2020   Stable, pt to continue current low chol diet, declines statin for now   Prediabetes Lab Results  Component Value Date   HGBA1C 5.3 06/22/2020   In the setting of uncontroleld obesity pt to start mounjaro 2.5 mg weekly   Alternating constipation and diarrhea ? IBS  - for bentyl prn, also refer GI per pt request  Weight increased Pt also for TSH with labs  Followup: Return in about 6 months (around 08/31/2022).  Cathlean Cower, MD 03/02/2022 9:43 PM [facility]

## 2022-03-02 NOTE — Assessment & Plan Note (Signed)
?   IBS  - for bentyl prn, also refer GI per pt request

## 2022-03-02 NOTE — Patient Instructions (Signed)
Please take all new medication as prescribed - the dicyclomine as needed for abd pain, and mounjaro if ok with insurance  Please continue all other medications as before, and refills have been done if requested.  Please have the pharmacy call with any other refills you may need.  Please continue your efforts at being more active, low cholesterol diet, and weight control.  You are otherwise up to date with prevention measures today.  Please keep your appointments with your specialists as you may have planned  You will be contacted regarding the referral for: Gastroenterology  Please go to the LAB at the blood drawing area for the tests to be done  You will be contacted by phone if any changes need to be made immediately.  Otherwise, you will receive a letter about your results with an explanation, but please check with MyChart first.  Please remember to sign up for MyChart if you have not done so, as this will be important to you in the future with finding out test results, communicating by private email, and scheduling acute appointments online when needed.  Please make an Appointment to return in 6 months, or sooner if needed

## 2022-03-02 NOTE — Assessment & Plan Note (Signed)
Lab Results  Component Value Date   HGBA1C 5.3 06/22/2020   In the setting of uncontroleld obesity pt to start mounjaro 2.5 mg weekly

## 2022-03-03 ENCOUNTER — Encounter: Payer: Self-pay | Admitting: Physician Assistant

## 2022-03-03 ENCOUNTER — Other Ambulatory Visit: Payer: Self-pay

## 2022-03-05 ENCOUNTER — Encounter (HOSPITAL_COMMUNITY): Payer: Self-pay | Admitting: Psychiatry

## 2022-03-05 ENCOUNTER — Telehealth (INDEPENDENT_AMBULATORY_CARE_PROVIDER_SITE_OTHER): Payer: PRIVATE HEALTH INSURANCE | Admitting: Psychiatry

## 2022-03-05 ENCOUNTER — Other Ambulatory Visit: Payer: Self-pay

## 2022-03-05 DIAGNOSIS — F319 Bipolar disorder, unspecified: Secondary | ICD-10-CM

## 2022-03-05 DIAGNOSIS — F411 Generalized anxiety disorder: Secondary | ICD-10-CM | POA: Diagnosis not present

## 2022-03-05 DIAGNOSIS — F900 Attention-deficit hyperactivity disorder, predominantly inattentive type: Secondary | ICD-10-CM | POA: Diagnosis not present

## 2022-03-05 DIAGNOSIS — F17200 Nicotine dependence, unspecified, uncomplicated: Secondary | ICD-10-CM | POA: Diagnosis not present

## 2022-03-05 MED ORDER — ESCITALOPRAM OXALATE 20 MG PO TABS
20.0000 mg | ORAL_TABLET | Freq: Every day | ORAL | 3 refills | Status: DC
  Filled 2022-03-05: qty 30, 30d supply, fill #0
  Filled 2022-04-17: qty 30, 30d supply, fill #1

## 2022-03-05 MED ORDER — HYDROXYZINE HCL 25 MG PO TABS
25.0000 mg | ORAL_TABLET | Freq: Three times a day (TID) | ORAL | 3 refills | Status: DC | PRN
  Filled 2022-03-05: qty 90, 30d supply, fill #0

## 2022-03-05 MED ORDER — TRAZODONE HCL 50 MG PO TABS
50.0000 mg | ORAL_TABLET | Freq: Every evening | ORAL | 3 refills | Status: DC | PRN
  Filled 2022-03-05: qty 30, 30d supply, fill #0

## 2022-03-05 MED ORDER — ATOMOXETINE HCL 80 MG PO CAPS
80.0000 mg | ORAL_CAPSULE | Freq: Every day | ORAL | 3 refills | Status: DC
  Filled 2022-03-05: qty 30, 30d supply, fill #0

## 2022-03-05 MED ORDER — OLANZAPINE 20 MG PO TABS
20.0000 mg | ORAL_TABLET | Freq: Every day | ORAL | 3 refills | Status: DC
  Filled 2022-03-05: qty 30, 30d supply, fill #0
  Filled 2022-04-17: qty 30, 30d supply, fill #1

## 2022-03-05 MED ORDER — NICOTINE POLACRILEX 2 MG MT GUM
2.0000 mg | CHEWING_GUM | OROMUCOSAL | 3 refills | Status: DC | PRN
  Filled 2022-03-05: qty 100, 30d supply, fill #0

## 2022-03-05 NOTE — Progress Notes (Signed)
BH MD/PA/NP OP Progress Note Virtual Visit via Video Note  I connected with [name] on 03/05/22 at  2:00 PM EST by a video enabled telemedicine application and verified that I am speaking with the correct person using two identifiers.  Location: Patient: Home Provider: Clinic   I discussed the limitations of evaluation and management by telemedicine and the availability of in person appointments. The patient expressed understanding and agreed to proceed.  I provided 30 minutes of non-face-to-face time during this encounter.     03/05/2022 2:13 PM [name]  MRN:  [id number]  Chief Complaint: "I have been more anxious"    HPI: 42 year old female seen today for follow up psychiatric evaluation.    She has psychiatric history of depression, ADD, bipolar 1 disorder, and anxiety. She is currently managed on Lexapro 10 mg daily, hydroxyzine 25 mg three time daily, Zyprexa 20mg  nightly, NicoDerm 2 mg gum, Strattera 80 mg, and trazodone 50mg  nightly as needed. She notes her medications are somewhat effective in managing her psychiatric conditions.     Today she  logged in virtually but her microphone would not work. Provider utilized Radiographer, therapeutic and the phone. During exam she was was pleasant, cooperative, engaged in conversation, and maintained eye contact.  She informed Probation officer that she has been more anxious.  She notes that work has been stressful and is the source of some of her anxiety.  Today provider conducted a GAD-7 and patient scored a 15, at her last visit she scored a 5.  Provider also conducted PHQ-9 and patient scored an 18, at her last visit she scored an 93.  She endorses hypersomnia noting that she sleeps 10 or more hours nightly.  She endorses having adequate appetite.  Today she denies SI/HI/VAH, mania, paranoia.    Today patient agreeable to increase Lexapro 10 mg to 20 mg to help anxiety and depression.  She will continue her other medications as prescribed and  follow-up with outpatient counseling for therapy.  No other concerns at this time.       Visit Diagnosis:    ICD-10-CM   1. Generalized anxiety disorder  F41.1 escitalopram (LEXAPRO) 20 MG tablet    hydrOXYzine (ATARAX) 25 MG tablet    2. Bipolar 1 disorder (HCC)  F31.9 escitalopram (LEXAPRO) 20 MG tablet    traZODone (DESYREL) 50 MG tablet    3. Tobacco dependence  F17.200 nicotine polacrilex (NICORETTE) 2 MG gum    4. Attention deficit hyperactivity disorder (ADHD), predominantly inattentive type  F90.0 atomoxetine (STRATTERA) 80 MG capsule      Past Psychiatric History:  depression, ADD, bipolar 1 disorder, and anxiety  Past Medical History:  Past Medical History:  Diagnosis Date   ADD 10/22/2009   ALLERGIC RHINITIS 09/26/2008   Anemia    ANXIETY 09/26/2008   Asthma    Complication of anesthesia    "tempermental" per patient, "aggiated and hostile"   CONSTIPATION 09/26/2008   DEPRESSION 09/26/2008   FATIGUE 01/14/2009   Head injury with loss of consciousness ([place])    Headache    HYPERLIPIDEMIA 09/26/2008   IBS (irritable bowel syndrome)    OTITIS MEDIA, LEFT 01/14/2009   SKIN LESION 09/26/2008   Sleep apnea    TONSILLITIS, ACUTE 01/14/2009   Unconscious state ([place]) 10/13/2017   URI 10/22/2009    Past Surgical History:  Procedure Laterality Date   CERVICAL POLYPECTOMY  2009   CERVICAL POLYPECTOMY  2019   DILATATION & CURETTAGE/HYSTEROSCOPY WITH MYOSURE N/A 11/17/2017  Procedure: DILATATION & CURETTAGE/HYSTEROSCOPY WITH MYOSURE;  Surgeon: Geryl Rankins, MD;  Location: [facility];  Service: Gynecology;  Laterality: N/A;   ORIF ANKLE FRACTURE Right 08/24/2018   Procedure: OPEN REDUCTION INTERNAL FIXATION TRIMALLEOLAR ANKLE FRACTURE;  Surgeon: Eldred Manges, MD;  Location: [facility];  Service: Orthopedics;  Laterality: Right;    Family Psychiatric History: Brother depression and mother anxiety  Family History:  Family History  Problem Relation Age of Onset   Diabetes Other     Hypertension Other    Thyroid disease Mother    Hypertension Mother     Social History:  Social History   Socioeconomic History   Marital status: Single    Spouse name: Not on file   Number of children: Not on file   Years of education: Not on file   Highest education level: Not on file  Occupational History   Occupation: full time waitress  Tobacco Use   Smoking status: Every Day    Packs/day: 1.00    Years: 20.00    Total pack years: 20.00    Types: Cigarettes   Smokeless tobacco: Never  Vaping Use   Vaping Use: Former  Substance and Sexual Activity   Alcohol use: Not Currently   Drug use: No   Sexual activity: Not Currently    Birth control/protection: Abstinence  Other Topics Concern   Not on file  Social History Narrative   Lives with mother   Social Determinants of Health   Financial Resource Strain: Not on file  Food Insecurity: Not on file  Transportation Needs: Not on file  Physical Activity: Not on file  Stress: Not on file  Social Connections: Not on file    Allergies: No Known Allergies  Metabolic Disorder Labs: Lab Results  Component Value Date   HGBA1C 5.3 06/22/2020   MPG 105 06/22/2020   MPG 108 07/25/2019   No results found for: "PROLACTIN" Lab Results  Component Value Date   CHOL 148 06/22/2020   TRIG 111 06/22/2020   HDL 46 06/22/2020   CHOLHDL 3.2 06/22/2020   VLDL 22 06/22/2020   LDLCALC 80 06/22/2020   LDLCALC 80 07/25/2019   Lab Results  Component Value Date   TSH 4.263 06/22/2020   TSH 3.023 07/25/2019    Therapeutic Level Labs: No results found for: "LITHIUM" No results found for: "VALPROATE" Lab Results  Component Value Date   CBMZ 6.8 07/29/2019    Current Medications: Current Outpatient Medications  Medication Sig Dispense Refill   albuterol (VENTOLIN HFA) 108 (90 Base) MCG/ACT inhaler Inhale 2 puffs into the lungs every 6 (six) hours as needed for wheezing or shortness of breath. 6.7 g 0   atomoxetine  (STRATTERA) 80 MG capsule Take 1 capsule (80 mg total) by mouth daily. 30 capsule 3   dicyclomine (BENTYL) 10 MG capsule Take 1 capsule (10 mg total) by mouth 4 (four) times daily -  before meals and at bedtime. 90 capsule 2   escitalopram (LEXAPRO) 20 MG tablet Take 1 tablet (20 mg total) by mouth daily. 30 tablet 3   hydrOXYzine (ATARAX) 25 MG tablet Take 1 tablet (25 mg total) by mouth 3 (three) times daily as needed for anxiety. 90 tablet 3   levofloxacin (LEVAQUIN) 500 MG tablet Take 1 tablet (500 mg total) by mouth daily. 10 tablet 0   nicotine polacrilex (NICORETTE) 2 MG gum Take 1 each (2 mg total) by mouth as needed for smoking cessation. 100 tablet 3   OLANZapine (ZYPREXA)  20 MG tablet Take 1 tablet (20 mg total) by mouth at bedtime. 30 tablet 3   tirzepatide (MOUNJARO) 2.5 MG/0.5ML Pen Inject 2.5 mg into the skin once a week. 2 mL 11   traZODone (DESYREL) 50 MG tablet Take 1 tablet (50 mg total) by mouth at bedtime as needed for sleep. 30 tablet 3   No current facility-administered medications for this visit.     Musculoskeletal: Strength & Muscle Tone: within normal limits and telehealth visit Gait & Station: normal, telehealth visit Patient leans: N/A  Psychiatric Specialty Exam: Review of Systems  There were no vitals taken for this visit.There is no height or weight on file to calculate BMI.  General Appearance: Well Groomed  Eye Contact:  Good  Speech:  Clear and Coherent and Normal Rate  Volume:  Normal  Mood:  Anxious and Depressed  Affect:  Appropriate and Congruent  Thought Process:  Coherent, Goal Directed, and Linear  Orientation:  Full (Time, Place, and Person)  Thought Content: WDL and Logical   Suicidal Thoughts:  No  Homicidal Thoughts:  No  Memory:  Immediate;   Good Recent;   Good Remote;   Good  Judgement:  Good  Insight:  Good  Psychomotor Activity:  Normal  Concentration:  Concentration: Good and Attention Span: Good  Recall:  Good  Fund of  Knowledge: Good  Language: Good  Akathisia:  No  Handed:  Right  AIMS (if indicated): not done  Assets:  Communication Skills Desire for Improvement Financial Resources/Insurance Housing Intimacy Physical Health Social Support  ADL's:  Intact  Cognition: WNL  Sleep:  Good   Screenings: AIMS    Flowsheet Row Admission (Discharged) from 07/24/2019 in [place] [facility] Admission (Discharged) from 11/18/2018 in [place] [facility] Admission (Discharged) from 05/25/2018 in [place] [facility] Admission (Discharged) from 10/14/2017 in [place] [facility]  AIMS Total Score 0 0 0 0      AUDIT    Flowsheet Row Admission (Discharged) from OP Visit from 06/21/2020 in [place] [facility] Admission (Discharged) from 07/24/2019 in [place] [facility] Admission (Discharged) from 11/18/2018 in [place] [facility] Admission (Discharged) from 10/14/2017 in [place] [facility]  Alcohol Use Disorder Identification Test Final Score (AUDIT) 0 0 4 0      GAD-7    Flowsheet Row Video Visit from 03/05/2022 in [facility] Office Visit from 12/03/2021 in [facility] Office Visit from 09/10/2021 in [place] from 03/03/2021 in [facility] Video Visit from 12/05/2020 in [facility]  Total GAD-7 Score 15 5 9 12 8       PHQ2-9    Flowsheet Row Video Visit from 03/05/2022 in [facility] Office Visit from 03/02/2022 in [facility] Visit from 12/03/2021 in [facility] Office Visit from 09/10/2021 in [place] from  03/03/2021 in [place]  PHQ-2 Total Score 4 1 1 1 4   PHQ-9 Total Score 18 -- 11 14 19       Flowsheet Row Clinical Support from 09/04/2020 in [facility] Counselor from 07/24/2020 in [facility] Admission (Discharged) from OP Visit from 06/21/2020 in [place]  ADULT [facility]B  C-SSRS RISK CATEGORY No Risk No Risk Moderate Risk        Assessment and Plan: Patient reports that her anxiety and depression has worsened since her last visit.  Today she is agreeable to increasing Lexapro 10 mg to 20 mg to help manage anxiety and depression.  She will continue all other medications as prescribed.  1. Attention deficit hyperactivity disorder (ADHD), predominantly inattentive type  Continue- atomoxetine (STRATTERA) 80 MG capsule; Take 1 capsule (80 mg total) by mouth daily.  Dispense: 30 capsule; Refill: 3  2. Bipolar 1 disorder (HCC)  Continue- escitalopram (LEXAPRO) 20 MG tablet; Take 1 tablet (20 mg total) by mouth once daily.  Dispense: 30 tablet; Refill: 3 Continue- OLANZapine zydis (ZYPREXA) 20 MG disintegrating tablet; Take 1 tablet (20 mg total) by mouth at bedtime.  Dispense: 30 tablet; Refill: 3 Continue- traZODone (DESYREL) 50 MG tablet; Take 1 tablet (50 mg total) by mouth at bedtime as needed for sleep.  Dispense: 30 tablet; Refill: 3  3. Generalized anxiety disorder  Continue- escitalopram (LEXAPRO) 20 MG tablet; Take 1 tablet (20 mg total) by mouth once daily.  Dispense: 30 tablet; Refill: 3 Continue- hydrOXYzine (ATARAX) 25 MG tablet; Take 1 tablet (25 mg total) by mouth 3 (three) times daily as needed for anxiety.  Dispense: 90 tablet; Refill: 3  4. Tobacco dependence  - nicotine polacrilex (NICORETTE) 2 MG gum; Take 1 each (2 mg total) by mouth as needed for smoking cessation.  Dispense: 100 tablet; Refill: 3   Follow up in 3 months Follow up with therapy  Salley Slaughter, NP 03/05/2022, 2:13 PM

## 2022-03-06 ENCOUNTER — Other Ambulatory Visit: Payer: Self-pay

## 2022-03-11 ENCOUNTER — Other Ambulatory Visit: Payer: Self-pay

## 2022-03-16 ENCOUNTER — Other Ambulatory Visit: Payer: Self-pay

## 2022-03-24 ENCOUNTER — Other Ambulatory Visit: Payer: Self-pay

## 2022-04-01 ENCOUNTER — Telehealth: Payer: Self-pay

## 2022-04-01 NOTE — Telephone Encounter (Signed)
Pharmacy Patient Advocate Encounter   Received notification from [place] that prior authorization for Mounjaro 2.'5MG'$ /0.5ML pen-injectors is required/requested.  Darcel Bayley is only approved for type 2 diabetes. No indication of T2DM on patient's chart.    Key: [id number] - PA not submitted

## 2022-04-03 NOTE — Progress Notes (Unsigned)
04/03/2022 SARENNA CORFIELD UA:[id number] 1981-01-12  Referring provider: Biagio Borg, MD Primary GI doctor: {acdocs:[id number]}  ASSESSMENT AND PLAN:   There are no diagnoses linked to this encounter.   Patient Care Team: Biagio Borg, MD as PCP - General  HISTORY OF PRESENT ILLNESS: 42 y.o. female with a past medical history of prediabetes, hyperlipidemia, elevated LFTs, and others listed below presents for evaluation of alternating const patient and diarrhea.  06/22/2020 AST 44, ALT 75, total bilirubin 0.3, alk phos 112.  Negative ethanol and urine drug screen.  Normal thyroid.  WBC 11, no anemia. 03/02/2018 for future labs pending from primary care  She  reports that she has been smoking cigarettes. She has a 20.00 pack-year smoking history. She has never used smokeless tobacco. She reports that she does not currently use alcohol. She reports that she does not use drugs.  RELEVANT LABS AND IMAGING: CBC    Component Value Date/Time   WBC 11.0 (H) 06/22/2020 0645   RBC 4.41 06/22/2020 0645   HGB 13.2 06/22/2020 0645   HCT 42.1 06/22/2020 0645   PLT 297 06/22/2020 0645   MCV 95.5 06/22/2020 0645   MCH 29.9 06/22/2020 0645   MCHC 31.4 06/22/2020 0645   RDW 13.7 06/22/2020 0645   LYMPHSABS 3.0 07/29/2019 0631   MONOABS 0.5 07/29/2019 0631   EOSABS 0.1 07/29/2019 0631   BASOSABS 0.0 07/29/2019 0631   No results for input(s): "HGB" in the last 8760 hours.   CMP     Component Value Date/Time   NA 136 06/23/2020 0619   K 3.8 06/23/2020 0619   CL 104 06/23/2020 0619   CO2 24 06/23/2020 0619   GLUCOSE 131 (H) 06/23/2020 0619   BUN 7 06/23/2020 0619   CREATININE 0.75 06/23/2020 0619   CALCIUM 9.1 06/23/2020 0619   PROT 7.1 06/23/2020 0619   ALBUMIN 4.3 06/23/2020 0619   AST 40 06/23/2020 0619   ALT 68 (H) 06/23/2020 0619   ALKPHOS 108 06/23/2020 0619   BILITOT 0.4 06/23/2020 0619   GFRNONAA >60 06/23/2020 0619   GFRAA >60 07/23/2019 2230      Latest Ref Rng &  Units 06/23/2020    6:19 AM 06/22/2020    6:45 AM 07/29/2019    6:31 AM  Hepatic Function  Total Protein 6.5 - 8.1 g/dL 7.1  7.1    7.2  7.0   Albumin 3.5 - 5.0 g/dL 4.3  4.4    4.5  4.2   AST 15 - 41 U/L 40  44    44  21   ALT 0 - 44 U/L 68  75    75  30   Alk Phosphatase 38 - 126 U/L 108  111    112  111   Total Bilirubin 0.3 - 1.2 mg/dL 0.4  0.4    0.3  0.4   Bilirubin, Direct 0.0 - 0.2 mg/dL  0.1  0.1       Current Medications:   Current Outpatient Medications (Endocrine & Metabolic):    tirzepatide (MOUNJARO) 2.5 MG/0.5ML Pen, Inject 2.5 mg into the skin once a week.   Current Outpatient Medications (Respiratory):    albuterol (VENTOLIN HFA) 108 (90 Base) MCG/ACT inhaler, Inhale 2 puffs into the lungs every 6 (six) hours as needed for wheezing or shortness of breath.    Current Outpatient Medications (Other):    atomoxetine (STRATTERA) 80 MG capsule, Take 1 capsule (80 mg total) by mouth daily.  dicyclomine (BENTYL) 10 MG capsule, Take 1 capsule (10 mg total) by mouth 4 (four) times daily -  before meals and at bedtime.   escitalopram (LEXAPRO) 20 MG tablet, Take 1 tablet (20 mg total) by mouth daily.   hydrOXYzine (ATARAX) 25 MG tablet, Take 1 tablet (25 mg total) by mouth 3 (three) times daily as needed for anxiety.   levofloxacin (LEVAQUIN) 500 MG tablet, Take 1 tablet (500 mg total) by mouth daily.   nicotine polacrilex (NICORETTE) 2 MG gum, Take 1 each (2 mg total) by mouth as needed for smoking cessation.   OLANZapine (ZYPREXA) 20 MG tablet, Take 1 tablet (20 mg total) by mouth at bedtime.   traZODone (DESYREL) 50 MG tablet, Take 1 tablet (50 mg total) by mouth at bedtime as needed for sleep.  Medical History:  Past Medical History:  Diagnosis Date   ADD 10/22/2009   ALLERGIC RHINITIS 09/26/2008   Anemia    ANXIETY 09/26/2008   Asthma    Complication of anesthesia    "tempermental" per patient, "aggiated and hostile"   CONSTIPATION 09/26/2008   DEPRESSION  09/26/2008   FATIGUE 01/14/2009   Head injury with loss of consciousness ([place])    Headache    HYPERLIPIDEMIA 09/26/2008   IBS (irritable bowel syndrome)    OTITIS MEDIA, LEFT 01/14/2009   SKIN LESION 09/26/2008   Sleep apnea    TONSILLITIS, ACUTE 01/14/2009   Unconscious state ([place]) 10/13/2017   URI 10/22/2009   Allergies: No Known Allergies   Surgical History:  She  has a past surgical history that includes Cervical polypectomy (2009); Dilatation & curettage/hysteroscopy with myosure (N/A, 11/17/2017); Cervical polypectomy (2019); and ORIF ankle fracture (Right, 08/24/2018). Family History:  Her family history includes Diabetes in an other family member; Hypertension in her mother and another family member; Thyroid disease in her mother.  REVIEW OF SYSTEMS  : All other systems reviewed and negative except where noted in the History of Present Illness.  PHYSICAL EXAM: There were no vitals taken for this visit. General Appearance: Well nourished, in no apparent distress. Head:   Normocephalic and atraumatic. Eyes:  sclerae anicteric,conjunctive pink  Respiratory: Respiratory effort normal, BS equal bilaterally without rales, rhonchi, wheezing. Cardio: RRR with no MRGs. Peripheral pulses intact.  Abdomen: Soft,  {BlankSingle:19197::"Flat","Obese","Non-distended"} ,active bowel sounds. {actendernessAB:27319} tenderness {anatomy; site abdomen:5010}. {BlankMultiple:19196::"Without guarding","With guarding","Without rebound","With rebound"}. No masses. Rectal: {acrectalexam:27461} Musculoskeletal: Full ROM, {PSY - GAIT AND STATION:22860} gait. {With/Without:[id number]} edema. Skin:  Dry and intact without significant lesions or rashes Neuro: Alert and  oriented x4;  No focal deficits. Psych:  Cooperative. Normal mood and affect.    Vladimir Crofts, PA-C 1:17 PM

## 2022-04-06 ENCOUNTER — Other Ambulatory Visit: Payer: Self-pay

## 2022-04-06 ENCOUNTER — Other Ambulatory Visit (INDEPENDENT_AMBULATORY_CARE_PROVIDER_SITE_OTHER): Payer: 59

## 2022-04-06 ENCOUNTER — Encounter: Payer: Self-pay | Admitting: Physician Assistant

## 2022-04-06 ENCOUNTER — Ambulatory Visit (INDEPENDENT_AMBULATORY_CARE_PROVIDER_SITE_OTHER): Payer: 59 | Admitting: Physician Assistant

## 2022-04-06 VITALS — BP 124/72 | HR 90 | Ht 69.25 in | Wt 321.0 lb

## 2022-04-06 DIAGNOSIS — R197 Diarrhea, unspecified: Secondary | ICD-10-CM

## 2022-04-06 DIAGNOSIS — K219 Gastro-esophageal reflux disease without esophagitis: Secondary | ICD-10-CM | POA: Diagnosis not present

## 2022-04-06 DIAGNOSIS — R7989 Other specified abnormal findings of blood chemistry: Secondary | ICD-10-CM

## 2022-04-06 DIAGNOSIS — R140 Abdominal distension (gaseous): Secondary | ICD-10-CM

## 2022-04-06 LAB — SEDIMENTATION RATE: Sed Rate: 24 mm/hr — ABNORMAL HIGH (ref 0–20)

## 2022-04-06 LAB — HIGH SENSITIVITY CRP: CRP, High Sensitivity: 8.87 mg/L — ABNORMAL HIGH (ref 0.000–5.000)

## 2022-04-06 MED ORDER — OSCIMIN 0.125 MG PO TABS
0.1250 mg | ORAL_TABLET | ORAL | 1 refills | Status: AC | PRN
  Filled 2022-04-06: qty 30, 5d supply, fill #0

## 2022-04-06 NOTE — Patient Instructions (Addendum)
Your provider has requested that you go to the basement level for lab work before leaving today. Press "B" on the elevator. The lab is located at the first door on the left as you exit the elevator.  Add fiber like benefiber or citracel once a day Increase activity Can do trial of IBGard which is over the counter for AB pain- Take 1-2 capsules once a day for maintence or twice a day during a flare Can send in an anti spasm medication, Levsin, to take as needed  We may want to evaluate you for small intestinal bacterial overgrowth, this can cause increase gas, bloating, loose stools or constipation.  There is a test for this we can do or sometimes we will treat a patient with an antibiotic to see if it helps.   Can take pepcid as needed for GERD Please take this medication 30 minutes to 1 hour before meals- this makes it more effective.  Avoid spicy and acidic foods Avoid fatty foods Limit your intake of coffee, tea, alcohol, and carbonated drinks Work to maintain a healthy weight Keep the head of the bed elevated at least 3 inches with blocks or a wedge pillow if you are having any nighttime symptoms Stay upright for 2 hours after eating Avoid meals and snacks three to four hours before bedtime Stop smoking    Please try low FODMAP diet- see below- start with eliminating just one column at a time, the table at the very bottom contains foods that are safe to take   FODMAP stands for fermentable oligo-, di-, mono-saccharides and polyols (1). These are the scientific terms used to classify groups of carbs that are notorious for triggering digestive symptoms like bloating, gas and stomach pain.

## 2022-04-07 LAB — IGA: Immunoglobulin A: 142 mg/dL (ref 47–310)

## 2022-04-07 LAB — TISSUE TRANSGLUTAMINASE, IGA: (tTG) Ab, IgA: <1 U/mL

## 2022-04-13 ENCOUNTER — Other Ambulatory Visit: Payer: Self-pay

## 2022-04-14 ENCOUNTER — Other Ambulatory Visit: Payer: Self-pay

## 2022-04-14 DIAGNOSIS — R7989 Other specified abnormal findings of blood chemistry: Secondary | ICD-10-CM

## 2022-04-14 DIAGNOSIS — R140 Abdominal distension (gaseous): Secondary | ICD-10-CM

## 2022-04-14 DIAGNOSIS — R197 Diarrhea, unspecified: Secondary | ICD-10-CM

## 2022-04-20 ENCOUNTER — Other Ambulatory Visit: Payer: Self-pay

## 2022-04-20 NOTE — Progress Notes (Signed)
Agree with the assessment and plan as outlined by Ellionna Collier, PA-C. ? ?Janay Canan, DO, FACG ? ?

## 2022-05-20 ENCOUNTER — Telehealth (INDEPENDENT_AMBULATORY_CARE_PROVIDER_SITE_OTHER): Payer: PRIVATE HEALTH INSURANCE | Admitting: Psychiatry

## 2022-05-20 ENCOUNTER — Encounter (HOSPITAL_COMMUNITY): Payer: Self-pay | Admitting: Psychiatry

## 2022-05-20 ENCOUNTER — Other Ambulatory Visit: Payer: Self-pay

## 2022-05-20 DIAGNOSIS — F319 Bipolar disorder, unspecified: Secondary | ICD-10-CM | POA: Diagnosis not present

## 2022-05-20 DIAGNOSIS — F17200 Nicotine dependence, unspecified, uncomplicated: Secondary | ICD-10-CM

## 2022-05-20 DIAGNOSIS — F411 Generalized anxiety disorder: Secondary | ICD-10-CM

## 2022-05-20 DIAGNOSIS — F17210 Nicotine dependence, cigarettes, uncomplicated: Secondary | ICD-10-CM

## 2022-05-20 MED ORDER — OLANZAPINE 20 MG PO TABS
20.0000 mg | ORAL_TABLET | Freq: Every day | ORAL | 3 refills | Status: DC
  Filled 2022-05-20 – 2022-05-29 (×2): qty 30, 30d supply, fill #0
  Filled 2022-07-08: qty 30, 30d supply, fill #1

## 2022-05-20 MED ORDER — NICOTINE POLACRILEX 2 MG MT GUM
2.0000 mg | CHEWING_GUM | OROMUCOSAL | 3 refills | Status: DC | PRN
  Filled 2022-05-20 – 2022-05-29 (×2): qty 100, 30d supply, fill #0

## 2022-05-20 MED ORDER — TRAZODONE HCL 50 MG PO TABS
50.0000 mg | ORAL_TABLET | Freq: Every evening | ORAL | 3 refills | Status: DC | PRN
Start: 2022-05-20 — End: 2022-08-06
  Filled 2022-05-20 – 2022-05-29 (×2): qty 30, 30d supply, fill #0

## 2022-05-20 MED ORDER — HYDROXYZINE HCL 25 MG PO TABS
25.0000 mg | ORAL_TABLET | Freq: Three times a day (TID) | ORAL | 3 refills | Status: DC | PRN
  Filled 2022-05-20 – 2022-05-29 (×2): qty 90, 30d supply, fill #0

## 2022-05-20 MED ORDER — ESCITALOPRAM OXALATE 20 MG PO TABS
20.0000 mg | ORAL_TABLET | Freq: Every day | ORAL | 3 refills | Status: DC
  Filled 2022-05-20 – 2022-05-29 (×2): qty 30, 30d supply, fill #0
  Filled 2022-07-08: qty 30, 30d supply, fill #1

## 2022-05-20 NOTE — Progress Notes (Signed)
[facility] MD/PA/NP OP Progress Note Virtual Visit via Video Note  I connected with [name] Setting on 05/20/22 at  1:30 PM EDT by a video enabled telemedicine application and verified that I am speaking with the correct person using two identifiers.  Location: Patient: Home Provider: Clinic   I discussed the limitations of evaluation and management by telemedicine and the availability of in person appointments. The patient expressed understanding and agreed to proceed.  I provided 30 minutes of non-face-to-face time during this encounter.     05/20/2022 1:34 PM LEANA SPRINGSTON  MRN:  [id number]  Chief Complaint: "I am stable"    HPI: 42 year old female seen today for follow up psychiatric evaluation.    She has psychiatric history of depression, ADD, bipolar 1 disorder, and anxiety. She is currently managed on Lexapro 20 mg daily, hydroxyzine 25 mg three time daily, Zyprexa  nightly, NicoDerm 2 mg gum, Strattera 80 mg, and trazodone  nightly as needed. She notes her that she discontinued Strattera but notes that her other medications are effective in managing her psychiatric conditions.     Today she was well-groomed, pleasant, cooperative, and engaged in conversation.  She informed Clinical research associate that she feels stable.  She informed Clinical research associate that since increasing Lexapro she has been less anxious and depressed.  Today provider conducted GAD-7 and patient scored a 7, at her last visit she scored a 15.  Provider also conducted PHQ-9 the patient scored a 13, at her last visit she scored an 18.  Patient continues to endorse hypersomnia noting that she is sleeping 10 hours or more.  She endorses increased appetite but denies weight gain. Today she denies SI/HI/VAH, mania, paranoia.    Patient finds Strattera ineffective and at this time does not wish to restart it.  She will continue all other medications as prescribed.  No other concerns noted at this time.      Visit Diagnosis:    ICD-10-CM   1.  Generalized anxiety disorder  F41.1 escitalopram (LEXAPRO) 20 MG tablet    hydrOXYzine (ATARAX) 25 MG tablet    2. Bipolar 1 disorder  F31.9 escitalopram (LEXAPRO) 20 MG tablet    traZODone (DESYREL) 50 MG tablet    3. Tobacco dependence  F17.200 nicotine polacrilex (NICORETTE) 2 MG gum      Past Psychiatric History:  depression, ADD, bipolar 1 disorder, and anxiety  Past Medical History:  Past Medical History:  Diagnosis Date   ADD 10/22/2009   ALLERGIC RHINITIS 09/26/2008   Anemia    ANXIETY 09/26/2008   Asthma    Complication of anesthesia    "tempermental" per patient, "aggiated and hostile"   CONSTIPATION 09/26/2008   DEPRESSION 09/26/2008   FATIGUE 01/14/2009   Head injury with loss of consciousness ([facility])    Headache    HYPERLIPIDEMIA 09/26/2008   IBS (irritable bowel syndrome)    OTITIS MEDIA, LEFT 01/14/2009   SKIN LESION 09/26/2008   Sleep apnea    TONSILLITIS, ACUTE 01/14/2009   Unconscious state ([facility]) 10/13/2017   URI 10/22/2009    Past Surgical History:  Procedure Laterality Date   CERVICAL POLYPECTOMY  2009   CERVICAL POLYPECTOMY  2019   DILATATION & CURETTAGE/HYSTEROSCOPY WITH MYOSURE N/A 11/17/2017   Procedure: DILATATION & CURETTAGE/HYSTEROSCOPY WITH MYOSURE;  Surgeon: Geryl Rankins, MD;  Location: [facility];  Service: Gynecology;  Laterality: N/A;   ORIF ANKLE FRACTURE Right 08/24/2018   Procedure: OPEN REDUCTION INTERNAL FIXATION TRIMALLEOLAR ANKLE FRACTURE;  Surgeon: Eldred Manges, MD;  Location: [facility];  Service: Orthopedics;  Laterality: Right;    Family Psychiatric History: Brother depression and mother anxiety  Family History:  Family History  Problem Relation Age of Onset   Thyroid disease Mother    Hypertension Mother    Irritable bowel syndrome Mother    Clotting disorder Maternal Grandmother    Diabetes Maternal Aunt    Colon cancer Neg Hx    Esophageal cancer Neg Hx     Social History:  Social History   Socioeconomic History   Marital status:  Single    Spouse name: Not on file   Number of children: 0   Years of education: Not on file   Highest education level: Not on file  Occupational History   Occupation: full time waitress  Tobacco Use   Smoking status: Every Day    Packs/day: 1.00    Years: 20.00    Additional pack years: 0.00    Total pack years: 20.00    Types: Cigarettes   Smokeless tobacco: Never  Vaping Use   Vaping Use: Former  Substance and Sexual Activity   Alcohol use: Not Currently   Drug use: No   Sexual activity: Not Currently    Birth control/protection: Abstinence  Other Topics Concern   Not on file  Social History Narrative   Lives with mother   Social Determinants of Health   Financial Resource Strain: Not on file  Food Insecurity: Not on file  Transportation Needs: Not on file  Physical Activity: Not on file  Stress: Not on file  Social Connections: Not on file    Allergies: No Known Allergies  Metabolic Disorder Labs: Lab Results  Component Value Date   HGBA1C 5.3 06/22/2020   MPG 105 06/22/2020   MPG 108 07/25/2019   No results found for: "PROLACTIN" Lab Results  Component Value Date   CHOL 148 06/22/2020   TRIG 111 06/22/2020   HDL 46 06/22/2020   CHOLHDL 3.2 06/22/2020   VLDL 22 06/22/2020   LDLCALC 80 06/22/2020   LDLCALC 80 07/25/2019   Lab Results  Component Value Date   TSH 4.263 06/22/2020   TSH 3.023 07/25/2019    Therapeutic Level Labs: No results found for: "LITHIUM" No results found for: "VALPROATE" Lab Results  Component Value Date   CBMZ 6.8 07/29/2019    Current Medications: Current Outpatient Medications  Medication Sig Dispense Refill   albuterol (VENTOLIN HFA) 108 (90 Base) MCG/ACT inhaler Inhale 2 puffs into the lungs every 6 (six) hours as needed for wheezing or shortness of breath. 6.7 g 0   escitalopram (LEXAPRO) 20 MG tablet Take 1 tablet (20 mg total) by mouth daily. 30 tablet 3   hydrOXYzine (ATARAX) 25 MG tablet Take 1 tablet (25 mg  total) by mouth 3 (three) times daily as needed for anxiety. 90 tablet 3   hyoscyamine (OSCIMIN) 0.125 MG tablet Take 1 tablet (0.125 mg total) by mouth every 4 (four) hours as needed for cramping. 30 tablet 1   levofloxacin (LEVAQUIN) 500 MG tablet Take 1 tablet (500 mg total) by mouth daily. 10 tablet 0   nicotine polacrilex (NICORETTE) 2 MG gum Take 1 each (2 mg total) by mouth as needed for smoking cessation. 100 tablet 3   OLANZapine (ZYPREXA) 20 MG tablet Take 1 tablet (20 mg total) by mouth at bedtime. 30 tablet 3   traZODone (DESYREL) 50 MG tablet Take 1 tablet (50 mg total) by mouth at bedtime as needed for sleep. 30  tablet 3   No current facility-administered medications for this visit.     Musculoskeletal: Strength & Muscle Tone: within normal limits and telehealth visit Gait & Station: normal, telehealth visit Patient leans: N/A  Psychiatric Specialty Exam: Review of Systems  There were no vitals taken for this visit.There is no height or weight on file to calculate BMI.  General Appearance: Well Groomed  Eye Contact:  Good  Speech:  Clear and Coherent and Normal Rate  Volume:  Normal  Mood:  Euthymic  Affect:  Appropriate and Congruent  Thought Process:  Coherent, Goal Directed, and Linear  Orientation:  Full (Time, Place, and Person)  Thought Content: WDL and Logical   Suicidal Thoughts:  No  Homicidal Thoughts:  No  Memory:  Immediate;   Good Recent;   Good Remote;   Good  Judgement:  Good  Insight:  Good  Psychomotor Activity:  Normal  Concentration:  Concentration: Good and Attention Span: Good  Recall:  Good  Fund of Knowledge: Good  Language: Good  Akathisia:  No  Handed:  Right  AIMS (if indicated): not done  Assets:  Communication Skills Desire for Improvement Financial Resources/Insurance Housing Intimacy Physical Health Social Support  ADL's:  Intact  Cognition: WNL  Sleep:  Good   Screenings: AIMS    Flowsheet Row [facility] (Dis[facility] (Dis[facility]) [facility]  AIMS Total Score 0 0 0 0      AUDIT    Flowsheet Row Admission (Discharged) from [facility] Ad[facility] (Discharged) [facility] Ad[facility] (Discharged[facility] (Dis[facility]) [facility]  Alcohol Use Disorder Identification Test Final Score (AUDIT) 0 0 4 0      GAD-7    Flowsheet Row Video Visit from 05/20/2022 in [facility] Video Visit from 03/05/2022 in [facility] Office Visit from 12/03/2021 in [facility] Office Visit from 09/10/2021 in [facility] Clinical Support from 03/03/2021 in [facility]  Total GAD-7 Score 7 15 5 9 12       PHQ2-9    Flowsheet Row Video Visit from 05/20/2022 in [facility] Video Visit from 03/05/2022 in [facility] Office Visit from 03/02/2022 in [facility] Office Visit from 12/03/2021 in [facility] Office Visit from 09/10/2021 in [facility]  PHQ-2 Total Score 2 4 1 1 1   PHQ-9 Total Score 13 18 -- 11 14      Flowsheet Row Clinical Support from 09/04/2020 in [facility] Counselor from 07/24/2020 in [facility] An Admission (Discharged) from [facility]  C-SSRS RISK CATEGORY No Risk No Risk Moderate Risk        Assessment and Plan: Patient reports that her anxiety and depression has improved since her last visit.  Patient finds Strattera ineffective and at this time does not wish to restart it.  She will continue all other medications as prescribed.  1. Generalized anxiety disorder  Continue- escitalopram (LEXAPRO) 20 MG tablet; Take 1 tablet (20 mg total) by mouth daily.  Dispense: 30 tablet; Refill: 3 Continue- hydrOXYzine (ATARAX) 25 MG tablet; Take 1 tablet (25 mg total) by mouth 3 (three) times daily as needed for anxiety.  Dispense: 90 tablet; Refill: 3  2. Bipolar 1 disorder  Continue- escitalopram (LEXAPRO) 20 MG tablet; Take 1 tablet (20 mg total) by mouth daily.  Dispense: 30 tablet; Refill: 3 Continue- traZODone (DESYREL) 50 MG tablet; Take 1 tablet (50 mg total) by mouth at bedtime as needed for sleep.  Dispense: 30 tablet; Refill: 3  3. Tobacco dependence  Continue- nicotine polacrilex (NICORETTE) 2 MG gum; Take 1 each (2 mg total) by mouth as needed for smoking cessation.  Dispense: 100 tablet; Refill: 3  Follow up in 3 months Follow up with therapy  Shanna Cisco, NP 05/20/2022, 1:34 PM

## 2022-05-27 ENCOUNTER — Other Ambulatory Visit: Payer: Self-pay

## 2022-05-29 ENCOUNTER — Other Ambulatory Visit: Payer: Self-pay

## 2022-06-11 NOTE — Progress Notes (Deleted)
06/11/2022 [name] [id number] [birth year]  Referring provider: Corwin Levins, MD Primary GI doctor: Dr. Barron Alvine  ASSESSMENT AND PLAN:   Diarrhea for 7 months with bloating Get labs PCP wanted for TSH, CBC to check for anemia or infection  -TTG/IGA to evaluate for celiac disease.  Will check ESR/CRP to evaluate for IBD Can do trial of IBGARD daily, will give Bentyl as needed in the mean time  Add on citracel/benefiber FODMAP, trial off lactulose and lifestyle changes discussed Patient is not interested in a colonoscopy at this time, Pending labs and response to medication, will schedule endoscopic evaluation, close follow up 2 months Consider SIBO testing or xifaxin trial pending results  Gastroesophageal reflux disease without esophagitis Lifestyle changes discussed, avoid NSAIDS, ETOH Will add on pepcid at night. and Weight loss discussed with patient in detail. Smoking cessation discussed with patient  Elevated liver function tests --Continue to work on risk factor modification including diet exercise and control of risk factors including blood sugars. - monitor q 6 months.     Patient Care Team: Corwin Levins, MD as PCP - General  HISTORY OF PRESENT ILLNESS: 42 y.o. female with a past medical history of prediabetes, hyperlipidemia, elevated LFTs, and others listed below presents for evaluation of alternating constipation and diarrhea.   06/22/2020 AST 44, ALT 75, total bilirubin 0.3, alk phos 112.  Negative ethanol and urine drug screen.  Normal thyroid.  WBC 11, no anemia.  Patient was having loose stools and then constipation past 7 months intermittently.  With bloating. CRP elevated 8.8, sed rate slightly elevated 24, negative celiac.  Pending CBC, CMET, thyroid, iron ferritin and repeat CRP. Suggest continuing on with colonoscopy.  She states she has loose stools and then constipation in last 7 months, prior to that was daily formed Bm's. She has been on  zyprexa for the last year, otherwise no new medications.  Has been on golo OTC but only in last month.  She was prescribed mounjaro but has not started.  She will have 2-3 Bm's daily, loose stools, will feel need/pressure to have BM but be unable to go. When she is able to go will have diarrhea.  Lower AB pain, better after BM.  Knows she is lactose intolerant, avoids this.  Has had some nocturnal symptoms with AB pain followed with loose stools.  No melena, no hematochezia.  Some AB bloating.  She has nausea, no vomiting. She has indigestion, takes tums once or twice a week. No dysphagia. Worse with fried foods.   Has thyroid autoimmune in her family but no IBD/celiac. No family history of GI malignancy.  No rashes/no Joint pain.  She does smoke, no ETOH, rare NSAIDS.   She  reports that she has been smoking cigarettes. She has a 20.00 pack-year smoking history. She has never used smokeless tobacco. She reports that she does not currently use alcohol. She reports that she does not use drugs.  RELEVANT LABS AND IMAGING: CBC    Component Value Date/Time   WBC 11.0 (H) 06/22/2020 0645   RBC 4.41 06/22/2020 0645   HGB 13.2 06/22/2020 0645   HCT 42.1 06/22/2020 0645   PLT 297 06/22/2020 0645   MCV 95.5 06/22/2020 0645   MCH 29.9 06/22/2020 0645   MCHC 31.4 06/22/2020 0645   RDW 13.7 06/22/2020 0645   LYMPHSABS 3.0 07/29/2019 0631   MONOABS 0.5 07/29/2019 0631   EOSABS 0.1 07/29/2019 0631   BASOSABS 0.0 07/29/2019 0631  No results for input(s): "HGB" in the last 8760 hours.   CMP     Component Value Date/Time   NA 136 06/23/2020 0619   K 3.8 06/23/2020 0619   CL 104 06/23/2020 0619   CO2 24 06/23/2020 0619   GLUCOSE 131 (H) 06/23/2020 0619   BUN 7 06/23/2020 0619   CREATININE 0.75 06/23/2020 0619   CALCIUM 9.1 06/23/2020 0619   PROT 7.1 06/23/2020 0619   ALBUMIN 4.3 06/23/2020 0619   AST 40 06/23/2020 0619   ALT 68 (H) 06/23/2020 0619   ALKPHOS 108 06/23/2020 0619    BILITOT 0.4 06/23/2020 0619   GFRNONAA >60 06/23/2020 0619   GFRAA >60 07/23/2019 2230      Latest Ref Rng & Units 06/23/2020    6:19 AM 06/22/2020    6:45 AM 07/29/2019    6:31 AM  Hepatic Function  Total Protein 6.5 - 8.1 g/dL 7.1  7.1    7.2  7.0   Albumin 3.5 - 5.0 g/dL 4.3  4.4    4.5  4.2   AST 15 - 41 U/L 40  44    44  21   ALT 0 - 44 U/L 68  75    75  30   Alk Phosphatase 38 - 126 U/L 108  111    112  111   Total Bilirubin 0.3 - 1.2 mg/dL 0.4  0.4    0.3  0.4   Bilirubin, Direct 0.0 - 0.2 mg/dL  0.1  0.1       Current Medications:     Current Outpatient Medications (Respiratory):    albuterol (VENTOLIN HFA) 108 (90 Base) MCG/ACT inhaler, Inhale 2 puffs into the lungs every 6 (six) hours as needed for wheezing or shortness of breath.    Current Outpatient Medications (Other):    escitalopram (LEXAPRO) 20 MG tablet, Take 1 tablet (20 mg total) by mouth daily.   hydrOXYzine (ATARAX) 25 MG tablet, Take 1 tablet (25 mg total) by mouth 3 (three) times daily as needed for anxiety.   hyoscyamine (OSCIMIN) 0.125 MG tablet, Take 1 tablet (0.125 mg total) by mouth every 4 (four) hours as needed for cramping.   levofloxacin (LEVAQUIN) 500 MG tablet, Take 1 tablet (500 mg total) by mouth daily.   nicotine polacrilex (NICORETTE) 2 MG gum, Take 1 each (2 mg total) by mouth as needed for smoking cessation.   OLANZapine (ZYPREXA) 20 MG tablet, Take 1 tablet (20 mg total) by mouth at bedtime.   traZODone (DESYREL) 50 MG tablet, Take 1 tablet (50 mg total) by mouth at bedtime as needed for sleep.  Medical History:  Past Medical History:  Diagnosis Date   ADD 10/22/2009   ALLERGIC RHINITIS 09/26/2008   Anemia    ANXIETY 09/26/2008   Asthma    Complication of anesthesia    "tempermental" per patient, "aggiated and hostile"   CONSTIPATION 09/26/2008   DEPRESSION 09/26/2008   FATIGUE 01/14/2009   Head injury with loss of consciousness (HCC)    Headache    HYPERLIPIDEMIA 09/26/2008   IBS  (irritable bowel syndrome)    OTITIS MEDIA, LEFT 01/14/2009   SKIN LESION 09/26/2008   Sleep apnea    TONSILLITIS, ACUTE 01/14/2009   Unconscious state (HCC) 10/13/2017   URI 10/22/2009   Allergies: No Known Allergies   Surgical History:  She  has a past surgical history that includes Cervical polypectomy (2009); Dilatation & curettage/hysteroscopy with myosure (N/A, 11/17/2017); Cervical polypectomy (2019); and ORIF ankle  fracture (Right, 08/24/2018). Family History:  Her family history includes Clotting disorder in her maternal grandmother; Diabetes in her maternal aunt; Hypertension in her mother; Irritable bowel syndrome in her mother; Thyroid disease in her mother.  REVIEW OF SYSTEMS  : All other systems reviewed and negative except where noted in the History of Present Illness.  PHYSICAL EXAM: There were no vitals taken for this visit. General Appearance: Well nourished, in no apparent distress. Head:   Normocephalic and atraumatic. Eyes:  sclerae anicteric,conjunctive pink  Respiratory: Respiratory effort normal, BS equal bilaterally without rales, rhonchi, wheezing. Cardio: RRR with no MRGs. Peripheral pulses intact.  Abdomen: Soft,  Obese ,active bowel sounds. No tenderness . Without guarding and Without rebound. No masses. Rectal: Not evaluated Musculoskeletal: Full ROM, Normal gait. Without edema. Skin:  Dry and intact without significant lesions or rashes Neuro: Alert and  oriented x4;  No focal deficits. Psych:  Cooperative. Normal mood and affect.    Doree Albee, PA-C 11:56 AM

## 2022-06-15 ENCOUNTER — Ambulatory Visit: Payer: 59 | Admitting: Physician Assistant

## 2022-07-09 ENCOUNTER — Ambulatory Visit: Payer: Self-pay | Admitting: Internal Medicine

## 2022-08-06 ENCOUNTER — Encounter (HOSPITAL_COMMUNITY): Payer: Self-pay | Admitting: Psychiatry

## 2022-08-06 ENCOUNTER — Other Ambulatory Visit: Payer: Self-pay

## 2022-08-06 ENCOUNTER — Telehealth (INDEPENDENT_AMBULATORY_CARE_PROVIDER_SITE_OTHER): Payer: PRIVATE HEALTH INSURANCE | Admitting: Psychiatry

## 2022-08-06 DIAGNOSIS — F319 Bipolar disorder, unspecified: Secondary | ICD-10-CM | POA: Diagnosis not present

## 2022-08-06 DIAGNOSIS — F411 Generalized anxiety disorder: Secondary | ICD-10-CM | POA: Diagnosis not present

## 2022-08-06 DIAGNOSIS — F17200 Nicotine dependence, unspecified, uncomplicated: Secondary | ICD-10-CM

## 2022-08-06 DIAGNOSIS — F17210 Nicotine dependence, cigarettes, uncomplicated: Secondary | ICD-10-CM | POA: Diagnosis not present

## 2022-08-06 DIAGNOSIS — F900 Attention-deficit hyperactivity disorder, predominantly inattentive type: Secondary | ICD-10-CM | POA: Diagnosis not present

## 2022-08-06 MED ORDER — VILOXAZINE HCL ER 200 MG PO CP24
200.0000 mg | ORAL_CAPSULE | Freq: Every day | ORAL | 3 refills | Status: DC
Start: 2022-08-06 — End: 2022-11-04
  Filled 2022-08-06: qty 60, 30d supply, fill #0

## 2022-08-06 MED ORDER — ESCITALOPRAM OXALATE 20 MG PO TABS
20.0000 mg | ORAL_TABLET | Freq: Every day | ORAL | 3 refills | Status: DC
Start: 2022-08-06 — End: 2022-11-04
  Filled 2022-08-06 – 2022-08-19 (×2): qty 30, 30d supply, fill #0
  Filled 2022-09-22: qty 30, 30d supply, fill #1
  Filled 2022-10-26: qty 30, 30d supply, fill #2

## 2022-08-06 MED ORDER — NICOTINE POLACRILEX 2 MG MT GUM
2.0000 mg | CHEWING_GUM | OROMUCOSAL | 3 refills | Status: DC | PRN
Start: 2022-08-06 — End: 2022-11-04
  Filled 2022-08-06 – 2022-08-19 (×2): qty 100, 30d supply, fill #0

## 2022-08-06 MED ORDER — OLANZAPINE 20 MG PO TABS
20.0000 mg | ORAL_TABLET | Freq: Every day | ORAL | 3 refills | Status: DC
  Filled 2022-08-06 – 2022-08-19 (×2): qty 30, 30d supply, fill #0
  Filled 2022-09-22: qty 30, 30d supply, fill #1
  Filled 2022-10-26: qty 30, 30d supply, fill #2

## 2022-08-06 MED ORDER — TRAZODONE HCL 50 MG PO TABS
50.0000 mg | ORAL_TABLET | Freq: Every evening | ORAL | 3 refills | Status: DC | PRN
Start: 2022-08-06 — End: 2022-11-04
  Filled 2022-08-06 – 2022-08-19 (×2): qty 30, 30d supply, fill #0
  Filled 2022-09-22: qty 30, 30d supply, fill #1

## 2022-08-06 MED ORDER — HYDROXYZINE HCL 25 MG PO TABS
25.0000 mg | ORAL_TABLET | Freq: Three times a day (TID) | ORAL | 3 refills | Status: DC | PRN
Start: 2022-08-06 — End: 2022-11-04
  Filled 2022-08-06 – 2022-08-19 (×2): qty 90, 30d supply, fill #0
  Filled 2022-09-22: qty 90, 30d supply, fill #1

## 2022-08-06 NOTE — Progress Notes (Signed)
BH MD/PA/NP OP Progress Note Virtual Visit via Video Note  I connected with [name] on 08/06/22 at  2:00 PM EDT by a video enabled telemedicine application and verified that I am speaking with the correct person using two identifiers.  Location: Patient: Work Provider: Clinic   I discussed the limitations of evaluation and management by telemedicine and the availability of in person appointments. The patient expressed understanding and agreed to proceed.  I provided 30 minutes of non-face-to-face time during this encounter.     08/06/2022 2:28 PM [name]  MRN:  [id number]  Chief Complaint: "I am concerned about my ADHD"    HPI: 42 year old female seen today for follow up psychiatric evaluation.    She has psychiatric history of depression, ADD, bipolar 1 disorder, and anxiety. She is currently managed on Lexapro 20 mg daily, hydroxyzine 25 mg three time daily, Zyprexa 20mg  nightly, NicoDerm 2 mg gum, and trazodone 50mg  nightly as needed. She reports that her medications are somewhat effective in managing her psychiatric conditions.   Today she was well-groomed, pleasant, cooperative, and engaged in conversation.  She informed Clinical research associate that she is concerned about her ADHD.  She notes that she is forgetful, disorganized, and inattentive to mentally taxing task.  She informed Clinical research associate that her ADHD at times can interfere with her job.  Patient has tried Theatre manager without success provider discussed Vaughan Basta which patient notes that she would like to trial.   Since her last visit she informed writer that her anxiety and depression are well-managed.  Today provider conducted a GAD-7 and patient scored an 11, at her last visit she scored a 7.  Provider also conducted PHQ-9 patient scored 14, at her last visit she scored a 13.  She endorses fair sleep and adequate appetite.  Today she denies SI/HI/AVH, mania, paranoia.   Today patient agreeable to starting Qelbree 200 mg daily for a  week and then increasing to 400 mg daily to help manage symptoms of ADHD. Potential side effects of medication and risks vs benefits of treatment vs non-treatment were explained and discussed. All questions were answered. She will continue her other medications as prescribed.  No other concerns noted at this time.      Visit Diagnosis:    ICD-10-CM   1. Attention deficit hyperactivity disorder (ADHD), predominantly inattentive type  F90.0 viloxazine ER (QELBREE) 200 MG 24 hr capsule    2. Generalized anxiety disorder  F41.1 escitalopram (LEXAPRO) 20 MG tablet    hydrOXYzine (ATARAX) 25 MG tablet    3. Bipolar 1 disorder (HCC)  F31.9 escitalopram (LEXAPRO) 20 MG tablet    traZODone (DESYREL) 50 MG tablet    4. Tobacco dependence  F17.200 nicotine polacrilex (NICORETTE) 2 MG gum       Past Psychiatric History:  depression, ADD, bipolar 1 disorder, and anxiety  Past Medical History:  Past Medical History:  Diagnosis Date   ADD 10/22/2009   ALLERGIC RHINITIS 09/26/2008   Anemia    ANXIETY 09/26/2008   Asthma    Complication of anesthesia    "tempermental" per patient, "aggiated and hostile"   CONSTIPATION 09/26/2008   DEPRESSION 09/26/2008   FATIGUE 01/14/2009   Head injury with loss of consciousness (HCC)    Headache    HYPERLIPIDEMIA 09/26/2008   IBS (irritable bowel syndrome)    OTITIS MEDIA, LEFT 01/14/2009   SKIN LESION 09/26/2008   Sleep apnea    TONSILLITIS, ACUTE 01/14/2009   Unconscious state (HCC) 10/13/2017  URI 10/22/2009    Past Surgical History:  Procedure Laterality Date   CERVICAL POLYPECTOMY  2009   CERVICAL POLYPECTOMY  2019   DILATATION & CURETTAGE/HYSTEROSCOPY WITH MYOSURE N/A 11/17/2017   Procedure: DILATATION & CURETTAGE/HYSTEROSCOPY WITH MYOSURE;  Surgeon: Geryl Rankins, MD;  Location: [facility];  Service: Gynecology;  Laterality: N/A;   ORIF ANKLE FRACTURE Right 08/24/2018   Procedure: OPEN REDUCTION INTERNAL FIXATION TRIMALLEOLAR ANKLE FRACTURE;  Surgeon:  Eldred Manges, MD;  Location: [facility];  Service: Orthopedics;  Laterality: Right;    Family Psychiatric History: Brother depression and mother anxiety  Family History:  Family History  Problem Relation Age of Onset   Thyroid disease Mother    Hypertension Mother    Irritable bowel syndrome Mother    Clotting disorder Maternal Grandmother    Diabetes Maternal Aunt    Colon cancer Neg Hx    Esophageal cancer Neg Hx     Social History:  Social History   Socioeconomic History   Marital status: Single    Spouse name: Not on file   Number of children: 0   Years of education: Not on file   Highest education level: Not on file  Occupational History   Occupation: full time waitress  Tobacco Use   Smoking status: Every Day    Current packs/day: 1.00    Average packs/day: 1 pack/day for 20.0 years (20.0 ttl pk-yrs)    Types: Cigarettes   Smokeless tobacco: Never  Vaping Use   Vaping status: Former  Substance and Sexual Activity   Alcohol use: Not Currently   Drug use: No   Sexual activity: Not Currently    Birth control/protection: Abstinence  Other Topics Concern   Not on file  Social History Narrative   Lives with mother   Social Determinants of Health   Financial Resource Strain: Not on file  Food Insecurity: Not on file  Transportation Needs: Not on file  Physical Activity: Not on file  Stress: Not on file  Social Connections: Not on file    Allergies: No Known Allergies  Metabolic Disorder Labs: Lab Results  Component Value Date   HGBA1C 5.3 06/22/2020   MPG 105 06/22/2020   MPG 108 07/25/2019   No results found for: "PROLACTIN" Lab Results  Component Value Date   CHOL 148 06/22/2020   TRIG 111 06/22/2020   HDL 46 06/22/2020   CHOLHDL 3.2 06/22/2020   VLDL 22 06/22/2020   LDLCALC 80 06/22/2020   LDLCALC 80 07/25/2019   Lab Results  Component Value Date   TSH 4.263 06/22/2020   TSH 3.023 07/25/2019    Therapeutic Level Labs: No results found  for: "LITHIUM" No results found for: "VALPROATE" Lab Results  Component Value Date   CBMZ 6.8 07/29/2019    Current Medications: Current Outpatient Medications  Medication Sig Dispense Refill   viloxazine ER (QELBREE) 200 MG 24 hr capsule Take 1-2 capsules (200-400 mg total) by mouth daily. Take 200 mg for a week and then increase to 400 mg. 60 capsule 3   albuterol (VENTOLIN HFA) 108 (90 Base) MCG/ACT inhaler Inhale 2 puffs into the lungs every 6 (six) hours as needed for wheezing or shortness of breath. 6.7 g 0   escitalopram (LEXAPRO) 20 MG tablet Take 1 tablet (20 mg total) by mouth daily. 30 tablet 3   hydrOXYzine (ATARAX) 25 MG tablet Take 1 tablet (25 mg total) by mouth 3 (three) times daily as needed for anxiety. 90 tablet 3  hyoscyamine (OSCIMIN) 0.125 MG tablet Take 1 tablet (0.125 mg total) by mouth every 4 (four) hours as needed for cramping. 30 tablet 1   levofloxacin (LEVAQUIN) 500 MG tablet Take 1 tablet (500 mg total) by mouth daily. 10 tablet 0   nicotine polacrilex (NICORETTE) 2 MG gum Take 1 each (2 mg total) by mouth as needed for smoking cessation. 100 tablet 3   OLANZapine (ZYPREXA) 20 MG tablet Take 1 tablet (20 mg total) by mouth at bedtime. 30 tablet 3   traZODone (DESYREL) 50 MG tablet Take 1 tablet (50 mg total) by mouth at bedtime as needed for sleep. 30 tablet 3   No current facility-administered medications for this visit.     Musculoskeletal: Strength & Muscle Tone: within normal limits and telehealth visit Gait & Station: normal, telehealth visit Patient leans: N/A  Psychiatric Specialty Exam: Review of Systems  There were no vitals taken for this visit.There is no height or weight on file to calculate BMI.  General Appearance: Well Groomed  Eye Contact:  Good  Speech:  Clear and Coherent and Normal Rate  Volume:  Normal  Mood:  Euthymic  Affect:  Appropriate and Congruent  Thought Process:  Coherent, Goal Directed, and Linear  Orientation:   Full (Time, Place, and Person)  Thought Content: WDL and Logical   Suicidal Thoughts:  No  Homicidal Thoughts:  No  Memory:  Immediate;   Good Recent;   Good Remote;   Good  Judgement:  Good  Insight:  Good  Psychomotor Activity:  Normal  Concentration:  Concentration: Fair and Attention Span: Fair  Recall:  Good  Fund of Knowledge: Good  Language: Good  Akathisia:  No  Handed:  Right  AIMS (if indicated): not done  Assets:  Communication Skills Desire for Improvement Financial Resources/Insurance Housing Intimacy Physical Health Social Support  ADL's:  Intact  Cognition: WNL  Sleep:  Good   Screenings: AIMS    Flowsheet Row [facility] (Dis[facility] Ad[facility] ([facility]) [facility]  AIMS Total Score 0 0 0 0      AUDIT    Flowsheet Row Admission (Discharged) from [facility] (Dis[facility]) [facility] Ad[facility] (Discharged[facility] Ad[facility] (Dis[facility]) [facility]  Alcohol Use Disorder Identification Test Final Score (AUDIT) 0 0 4 0      GAD-7    Flowsheet Row Video Visit from 08/06/2022 in [facility] Video Visit from 05/20/2022 in [facility] Video Visit from 03/05/2022 in [facility] Office Visit from 12/03/2021 in [facility] Office Visit from 09/10/2021 in [facility]  Total GAD-7 Score 11 7 15 5 9       PHQ2-9     Flowsheet Row Video Visit from 08/06/2022 in [facility] Video Visit from 05/20/2022 in [facility] Video Visit from 03/05/2022 in [facility] Office Visit from 03/02/2022 in [facility] Office Visit from 12/03/2021 in [facility]  PHQ-2 Total Score 0 2  4 1 1   PHQ-9 Total Score 14 13 18  -- 11      Flowsheet Row Clinical Support from 09/04/2020 in [facility] Counselor from 07/24/2020 in [facility] Admission (Discharged) from [facility]  C-SSRS RISK CATEGORY No Risk No Risk Moderate Risk        Assessment and Plan: Patient endorses symptoms of ADHD but notes that her anxiety and depression continues to be well-managed.  Today patient agreeable to starting Qelbree 200 mg daily for a week and then increasing to 400 mg daily to help manage symptoms of ADHD.  She will continue her other medications as prescribed.   1. Generalized anxiety disorder  Continue- escitalopram (LEXAPRO) 20 MG tablet; Take 1 tablet (20 mg total) by mouth daily.  Dispense: 30 tablet; Refill: 3 Continue- hydrOXYzine (ATARAX) 25 MG tablet; Take 1 tablet (25 mg total) by mouth 3 (three) times daily as needed for anxiety.  Dispense: 90 tablet; Refill: 3  2. Bipolar 1 disorder (HCC)  Continue- escitalopram (LEXAPRO) 20 MG tablet; Take 1 tablet (20 mg total) by mouth daily.  Dispense: 30 tablet; Refill: 3 Continue- traZODone (DESYREL) 50 MG tablet; Take 1 tablet (50 mg total) by mouth at bedtime as needed for sleep.  Dispense: 30 tablet; Refill: 3  3. Tobacco dependence  Continue- nicotine polacrilex (NICORETTE) 2 MG gum; Take 1 each (2 mg total) by mouth as needed for smoking cessation.  Dispense: 100 tablet; Refill: 3  4. Attention deficit hyperactivity  disorder (ADHD), predominantly inattentive type  Start- viloxazine ER (QELBREE) 200 MG 24 hr capsule; Take 1-2 capsules (200-400 mg total) by mouth daily. Take 200 mg for a week and then increase to 400 mg.  Dispense: 60 capsule; Refill: 3   Follow up in 3 months Follow up with therapy  Shanna Cisco, NP 08/06/2022, 2:28 PM

## 2022-08-12 ENCOUNTER — Other Ambulatory Visit: Payer: Self-pay

## 2022-08-13 ENCOUNTER — Other Ambulatory Visit: Payer: Self-pay

## 2022-08-18 ENCOUNTER — Other Ambulatory Visit: Payer: Self-pay

## 2022-08-19 ENCOUNTER — Other Ambulatory Visit: Payer: Self-pay

## 2022-08-25 ENCOUNTER — Encounter: Payer: Self-pay | Admitting: Nurse Practitioner

## 2022-08-25 ENCOUNTER — Other Ambulatory Visit (INDEPENDENT_AMBULATORY_CARE_PROVIDER_SITE_OTHER): Payer: 59

## 2022-08-25 ENCOUNTER — Ambulatory Visit: Payer: 59 | Admitting: Nurse Practitioner

## 2022-08-25 VITALS — BP 126/68 | HR 110 | Ht 69.25 in | Wt 315.0 lb

## 2022-08-25 DIAGNOSIS — R197 Diarrhea, unspecified: Secondary | ICD-10-CM

## 2022-08-25 LAB — COMPREHENSIVE METABOLIC PANEL
ALT: 26 U/L (ref 0–35)
AST: 21 U/L (ref 0–37)
Albumin: 4.9 g/dL (ref 3.5–5.2)
Alkaline Phosphatase: 127 U/L — ABNORMAL HIGH (ref 39–117)
BUN: 10 mg/dL (ref 6–23)
CO2: 27 mEq/L (ref 19–32)
Calcium: 10.1 mg/dL (ref 8.4–10.5)
Chloride: 100 mEq/L (ref 96–112)
Creatinine, Ser: 0.79 mg/dL (ref 0.40–1.20)
GFR: 93.58 mL/min (ref >60.00)
Glucose, Bld: 98 mg/dL (ref 70–99)
Potassium: 3.6 mEq/L (ref 3.5–5.1)
Sodium: 137 mEq/L (ref 135–145)
Total Bilirubin: 0.5 mg/dL (ref 0.2–1.2)
Total Protein: 7.7 g/dL (ref 6.0–8.3)

## 2022-08-25 LAB — CBC
HCT: 42.4 % (ref 36.0–46.0)
Hemoglobin: 13.8 g/dL (ref 12.0–15.0)
MCHC: 32.5 g/dL (ref 30.0–36.0)
MCV: 91.2 fl (ref 78.0–100.0)
Platelets: 307 10*3/uL (ref 150.0–400.0)
RBC: 4.65 Mil/uL (ref 3.87–5.11)
RDW: 14.4 % (ref 11.5–15.5)
WBC: 13.9 10*3/uL — ABNORMAL HIGH (ref 4.0–10.5)

## 2022-08-25 LAB — SEDIMENTATION RATE: Sed Rate: 18 mm/hr (ref 0–20)

## 2022-08-25 LAB — TSH: TSH: 3.23 u[IU]/mL (ref 0.35–5.50)

## 2022-08-25 LAB — HIGH SENSITIVITY CRP: CRP, High Sensitivity: 21.41 mg/L — ABNORMAL HIGH (ref 0.000–5.000)

## 2022-08-25 NOTE — Patient Instructions (Addendum)
_______________________________________________________  If your blood pressure at your visit was 140/90 or greater, please contact your primary care physician to follow up on this.  _______________________________________________________  If you are age 40 or older, your body mass index should be between 23-30. Your Body mass index is 46.18 kg/m. If this is out of the aforementioned range listed, please consider follow up with your Primary Care Provider.  If you are age 77 or younger, your body mass index should be between 19-25. Your Body mass index is 46.18 kg/m. If this is out of the aformentioned range listed, please consider follow up with your Primary Care Provider.   ________________________________________________________  The [facility] providers would like to encourage you to use [facility] to communicate with providers for non-urgent requests or questions.  Due to long hold times on the telephone, sending your provider a message by [facility] may be a faster and more efficient way to get a response.  Please allow 48 business hours for a response.  Please remember that this is for non-urgent requests.  _______________________________________________________  Your provider has requested that you go to the basement level for lab work before leaving today. Press "B" on the elevator. The lab is located at the first door on the left as you exit the elevator.   Avoid diary, take Lactacid 1-2- tablets with any diary products.  Contact the office if diarrhea returns.   Screening colonoscopy due at age 76.   It was a pleasure to see you today!  Thank you for trusting me with your gastrointestinal care!

## 2022-08-25 NOTE — Progress Notes (Unsigned)
     08/25/2022 [name] [id number] [birth year]   Chief Complaint:  History of Present Illness:   Loose stools started 7 months ago. Once daily. Worse after milk and dairy. Almond milk.  Once daily urgent   No loose stools for a few weeks. No bloody stools. No mucous. No bloody stools.  3 weeks weeks ok lower RLQ pain, pressure right foot shoot up.  She stopped consuming milk.   Back and knee pain     Latest Ref Rng & Units 06/22/2020    6:45 AM 07/29/2019    6:31 AM 07/23/2019   10:30 PM  CBC  WBC 4.0 - 10.5 K/uL 11.0  8.4  14.1   Hemoglobin 12.0 - 15.0 g/dL 25.3  66.4  40.3   Hematocrit 36.0 - 46.0 % 42.1  41.7  40.4   Platelets 150 - 400 K/uL 297  240  279        Latest Ref Rng & Units 06/23/2020    6:19 AM 06/22/2020    6:45 AM 07/29/2019    6:31 AM  CMP  Glucose 70 - 99 mg/dL 474  259    BUN 6 - 20 mg/dL 7  <5    Creatinine 5.63 - 1.00 mg/dL 8.75  6.43    Sodium 329 - 145 mmol/L 136  135    Potassium 3.5 - 5.1 mmol/L 3.8  3.5    Chloride 98 - 111 mmol/L 104  105    CO2 22 - 32 mmol/L 24  23    Calcium 8.9 - 10.3 mg/dL 9.1  9.0    Total Protein 6.5 - 8.1 g/dL 7.1  7.1    7.2  7.0   Total Bilirubin 0.3 - 1.2 mg/dL 0.4  0.4    0.3  0.4   Alkaline Phos 38 - 126 U/L 108  111    112  111   AST 15 - 41 U/L 40  44    44  21   ALT 0 - 44 U/L 68  75    75  30       Current Medications, Allergies, Past Medical History, Past Surgical History, Family History and Social History were reviewed in [employer] record.   Review of Systems:   Constitutional: Negative for fever, sweats, chills or weight loss.  Respiratory: Negative for shortness of breath.   Cardiovascular: Negative for chest pain, palpitations and leg swelling.  Gastrointestinal: See HPI.  Musculoskeletal: Negative for back pain or muscle aches.  Neurological: Negative for dizziness, headaches or paresthesias.    Physical Exam: BP 126/68   Pulse (!) 110   Ht 5' 9.25" (1.759  m)   Wt (!) 315 lb (142.9 kg)   LMP 07/25/2022 (Approximate)   SpO2 96%   BMI 46.18 kg/m  General: in no acute distress. Head: Normocephalic and atraumatic. Eyes: No scleral icterus. Conjunctiva pink . Ears: Normal auditory acuity. Mouth: Dentition intact. No ulcers or lesions.  Lungs: Clear throughout to auscultation. Heart: Regular rate and rhythm, no murmur. Abdomen: Soft, nontender and nondistended. No masses or hepatomegaly. Normal bowel sounds x 4 quadrants.  Rectal: *** Musculoskeletal: Symmetrical with no gross deformities. Extremities: No edema. Neurological: Alert oriented x 4. No focal deficits.  Psychological: Alert and cooperative. Normal mood and affect  Assessment and Recommendations: ***

## 2022-08-26 ENCOUNTER — Other Ambulatory Visit: Payer: Self-pay

## 2022-08-26 DIAGNOSIS — R1031 Right lower quadrant pain: Secondary | ICD-10-CM

## 2022-08-26 DIAGNOSIS — M25551 Pain in right hip: Secondary | ICD-10-CM

## 2022-08-26 DIAGNOSIS — R7989 Other specified abnormal findings of blood chemistry: Secondary | ICD-10-CM

## 2022-08-28 ENCOUNTER — Telehealth: Payer: Self-pay | Admitting: Nurse Practitioner

## 2022-08-28 NOTE — Telephone Encounter (Signed)
PT returning call to go over lab results. Advised patient of the CT scheduled. Please call back to discuss.

## 2022-08-28 NOTE — Telephone Encounter (Signed)
Refer to lab results 08/25/22.

## 2022-08-31 NOTE — Progress Notes (Signed)
Agree with the assessment and plan as outlined by Colleen Kennedy-Smith, NP.   Aldora Perman, DO, FACG [facility]   

## 2022-09-04 ENCOUNTER — Other Ambulatory Visit: Payer: Self-pay

## 2022-09-08 ENCOUNTER — Ambulatory Visit (HOSPITAL_COMMUNITY): Payer: 59

## 2022-09-15 ENCOUNTER — Other Ambulatory Visit: Payer: Self-pay

## 2022-09-22 ENCOUNTER — Other Ambulatory Visit: Payer: Self-pay

## 2022-09-23 ENCOUNTER — Other Ambulatory Visit: Payer: Self-pay

## 2022-09-24 ENCOUNTER — Other Ambulatory Visit: Payer: Self-pay

## 2022-10-01 ENCOUNTER — Other Ambulatory Visit: Payer: Self-pay

## 2022-10-07 ENCOUNTER — Other Ambulatory Visit: Payer: Self-pay

## 2022-10-22 ENCOUNTER — Other Ambulatory Visit (HOSPITAL_COMMUNITY)
Admission: RE | Admit: 2022-10-22 | Discharge: 2022-10-22 | Disposition: A | Discharge status: Home / Self Care | Payer: 59 | Source: Ambulatory Visit | Attending: Obstetrics and Gynecology | Admitting: Obstetrics and Gynecology

## 2022-10-22 ENCOUNTER — Other Ambulatory Visit: Payer: Self-pay

## 2022-10-22 ENCOUNTER — Encounter: Payer: Self-pay | Admitting: Obstetrics and Gynecology

## 2022-10-22 ENCOUNTER — Ambulatory Visit (INDEPENDENT_AMBULATORY_CARE_PROVIDER_SITE_OTHER): Payer: 59 | Admitting: Obstetrics and Gynecology

## 2022-10-22 VITALS — BP 134/75 | HR 91 | Ht 69.25 in | Wt 318.6 lb

## 2022-10-22 DIAGNOSIS — Z01419 Encounter for gynecological examination (general) (routine) without abnormal findings: Secondary | ICD-10-CM | POA: Insufficient documentation

## 2022-10-22 DIAGNOSIS — Z30011 Encounter for initial prescription of contraceptive pills: Secondary | ICD-10-CM | POA: Diagnosis not present

## 2022-10-22 DIAGNOSIS — N898 Other specified noninflammatory disorders of vagina: Secondary | ICD-10-CM

## 2022-10-22 MED ORDER — SLYND 4 MG PO TABS
1.0000 | ORAL_TABLET | Freq: Every day | ORAL | 4 refills | Status: AC
Start: 2022-10-22 — End: ?
  Filled 2022-10-22: qty 84, 84d supply, fill #0

## 2022-10-22 NOTE — Progress Notes (Signed)
Pt presents for AEX.  Last PAP 07/2017 No mammogram  Pt c/o abnormal bleeding and Hx of cervical polyps

## 2022-10-22 NOTE — Progress Notes (Signed)
WELL-WOMAN PHYSICAL & PAP Patient name: [name] MRN [id number]  Date of birth: 05/31/1980 Chief Complaint:   No chief complaint on file.  History of Present Illness:   [name] is a 42 y.o. G56P0010 Caucasian female being seen today for a routine well-woman exam.  Current complaints: vaginal odor, AUB and desire to start OCP for menstrual control.  PCP: Oliver Barre, MD      does not desire labs No LMP recorded. The current method of family planning is abstinence.  Last pap 07/2017. Results were: normal per pt Last mammogram: never had. Family h/o breast cancer: unknown Last colonoscopy: n/a. Family h/o colorectal cancer: unknown Review of Systems:   Pertinent items are noted in HPI Denies any headaches, blurred vision, fatigue, shortness of breath, chest pain, abdominal pain, abnormal vaginal discharge/itching/odor/irritation, problems with periods, bowel movements, urination, or intercourse unless otherwise stated above. Pertinent History Reviewed:  Reviewed past medical,surgical, social and family history.  Reviewed problem list, medications and allergies. Physical Assessment:   Vitals:   10/22/22 1512  BP: 134/75  Pulse: 91  Weight: (!) 318 lb 9.6 oz (144.5 kg)  Height: 5' 9.25" (1.759 m)  Body mass index is 46.71 kg/m.        Physical Examination:   General appearance - well appearing, and in no distress, morbid obesity  Mental status - alert, oriented to person, place, and time  Psych:  She has a normal mood and affect  Skin - warm and dry, normal color, no suspicious lesions noted  Chest - effort normal, all lung fields clear to auscultation bilaterally  Heart - normal rate and regular rhythm  Neck:  midline trachea, no thyromegaly or nodules  Breasts - breasts appear normal, fibrocystic masses palpable, no skin or nipple changes or axillary nodes  Abdomen - soft, nontender, nondistended, no masses or organomegaly  Pelvic - VULVA: normal appearing vulva  with no masses, tenderness or lesions  VAGINA: normal appearing vagina with yellow, malodorous discharge, no lesions  CERVIX: anterior normal appearing cervix without discharge or lesions, no CMT  Thin prep pap is done with HR HPV cotesting  UTERUS: uterus is felt to be normal size, shape, consistency and nontender >> RETROVERTED  ADNEXA: No adnexal masses, mild RT tenderness noted.  Rectal - deferred  Extremities:  No swelling or varicosities noted  No results found for this or any previous visit (from the past 24 hour(s)).  Assessment & Plan:  1) Encounter for well woman exam with routine gynecological exam - Cytology - PAP( [place]) - MM 3D SCREENING MAMMOGRAM BILATERAL BREAST; Future - Cervicovaginal ancillary only( [place]) - HIV antibody (with reflex) - RPR - Hepatitis B Surface AntiGEN - Hepatitis C Antibody  2) Encounter for initial prescription of contraceptive pills - Samples #2 Slynd given to patient. Advised to pick up Rx, if satisfied with medication. If not satisfied, notify me by [employer]. - Rx: Drospirenone (SLYND) 4 MG TABS; Take 1 tablet (4 mg total) by mouth daily.  Dispense: 84 tablet; Refill: 4  3) Vaginal odor - Cervicovaginal ancillary only( [place])  - Will treat if any result is (+)   Labs/procedures today: pap and wet prep  Mammogram ORDERED  Colonoscopy at age 48 or sooner if problems  Orders Placed This Encounter  Procedures   MM 3D SCREENING MAMMOGRAM BILATERAL BREAST   HIV antibody (with reflex)   RPR   Hepatitis B Surface AntiGEN   Hepatitis C Antibody  Meds:  Meds ordered this encounter  Medications   Drospirenone (SLYND) 4 MG TABS    Sig: Take 1 tablet (4 mg total) by mouth daily.    Dispense:  84 tablet    Refill:  4    Order Specific Question:   Supervising Provider    Answer:   Samara Snide    Follow-up: Return for Annual Exam.  Raelyn Mora MSN, CNM 10/22/2022 4:30 PM

## 2022-10-23 LAB — CERVICOVAGINAL ANCILLARY ONLY
Chlamydia: NEGATIVE
Comment: NEGATIVE
Comment: NEGATIVE
Comment: NORMAL
Neisseria Gonorrhea: NEGATIVE
Trichomonas: NEGATIVE

## 2022-10-27 ENCOUNTER — Other Ambulatory Visit: Payer: Self-pay

## 2022-10-29 ENCOUNTER — Other Ambulatory Visit: Payer: Self-pay

## 2022-10-29 LAB — CYTOLOGY - PAP
Comment: NEGATIVE
Diagnosis: NEGATIVE
High risk HPV: NEGATIVE

## 2022-11-04 ENCOUNTER — Telehealth (INDEPENDENT_AMBULATORY_CARE_PROVIDER_SITE_OTHER): Payer: PRIVATE HEALTH INSURANCE | Admitting: Psychiatry

## 2022-11-04 ENCOUNTER — Encounter (HOSPITAL_COMMUNITY): Payer: Self-pay | Admitting: Psychiatry

## 2022-11-04 ENCOUNTER — Other Ambulatory Visit: Payer: Self-pay

## 2022-11-04 DIAGNOSIS — F17210 Nicotine dependence, cigarettes, uncomplicated: Secondary | ICD-10-CM | POA: Diagnosis not present

## 2022-11-04 DIAGNOSIS — F319 Bipolar disorder, unspecified: Secondary | ICD-10-CM | POA: Diagnosis not present

## 2022-11-04 DIAGNOSIS — F900 Attention-deficit hyperactivity disorder, predominantly inattentive type: Secondary | ICD-10-CM

## 2022-11-04 DIAGNOSIS — F411 Generalized anxiety disorder: Secondary | ICD-10-CM

## 2022-11-04 DIAGNOSIS — F17200 Nicotine dependence, unspecified, uncomplicated: Secondary | ICD-10-CM

## 2022-11-04 MED ORDER — ESCITALOPRAM OXALATE 20 MG PO TABS
20.0000 mg | ORAL_TABLET | Freq: Every day | ORAL | 3 refills | Status: DC
  Filled 2022-11-04 – 2022-12-03 (×2): qty 30, 30d supply, fill #0
  Filled 2023-01-05: qty 30, 30d supply, fill #1

## 2022-11-04 MED ORDER — OLANZAPINE 20 MG PO TABS
20.0000 mg | ORAL_TABLET | Freq: Every day | ORAL | 3 refills | Status: DC
  Filled 2022-11-04 – 2022-12-03 (×2): qty 30, 30d supply, fill #0
  Filled 2023-01-05: qty 30, 30d supply, fill #1

## 2022-11-04 MED ORDER — NICOTINE POLACRILEX 2 MG MT GUM
2.0000 mg | CHEWING_GUM | OROMUCOSAL | 3 refills | Status: DC | PRN
Start: 2022-11-04 — End: 2023-01-14
  Filled 2022-11-04: qty 100, 30d supply, fill #0

## 2022-11-04 MED ORDER — HYDROXYZINE HCL 25 MG PO TABS
25.0000 mg | ORAL_TABLET | Freq: Three times a day (TID) | ORAL | 3 refills | Status: DC | PRN
  Filled 2022-11-04: qty 90, 30d supply, fill #0

## 2022-11-04 MED ORDER — TRAZODONE HCL 50 MG PO TABS
50.0000 mg | ORAL_TABLET | Freq: Every evening | ORAL | 3 refills | Status: DC | PRN
  Filled 2022-11-04: qty 30, 30d supply, fill #0

## 2022-11-04 MED ORDER — BUPROPION HCL ER (XL) 150 MG PO TB24
150.0000 mg | ORAL_TABLET | ORAL | 3 refills | Status: DC
Start: 2022-11-04 — End: 2023-01-14
  Filled 2022-11-04: qty 30, 30d supply, fill #0
  Filled 2022-12-03: qty 30, 30d supply, fill #1
  Filled 2023-01-06: qty 30, 30d supply, fill #2

## 2022-11-04 NOTE — Progress Notes (Signed)
[facility] MD/PA/NP OP Progress Note Virtual Visit via Video Note  I connected with [name] on 11/04/22 at  3:00 PM EDT by a video enabled telemedicine application and verified that I am speaking with the correct person using two identifiers.  Location: Patient: Work Provider: Clinic   I discussed the limitations of evaluation and management by telemedicine and the availability of in person appointments. The patient expressed understanding and agreed to proceed.  I provided 30 minutes of non-face-to-face time during this encounter.     11/04/2022 3:26 PM [name]  MRN:  [id number]  Chief Complaint: "I was unable to get Leavy Cella"    HPI: 42 year old female seen today for follow up psychiatric evaluation.    She has psychiatric history of depression, ADD, bipolar 1 disorder, and anxiety. She is currently managed on Lexapro 20 mg daily, hydroxyzine 25 mg three time daily, Zyprexa 20mg  nightly, NicoDerm 2 mg gum, Qulbree 400 mg daily,  and trazodone 50mg  nightly as needed. She reports that her medications are somewhat effective in managing her psychiatric conditions.   Today she was well-groomed, pleasant, cooperative, and engaged in conversation.  She informed Clinical research associate that she was unable to get her Leavy Cella. She notes that her insurance would not cover it and notes that it was $ 1,000 without coverage. Provider asked patient if she would like a sample or if she preferred to try something else. She notes that she would like to try something else as she continues to be forgetful, disorganized, and inattentive to mentally taxing task.   Patient has tried Theatre manager without success provider discussed Wellbutrin and patient was agreeable.    Patient notes that she smokes 10 cigarettes daily. She also notes that her appetite has increased. She reports that she is gaining weight and has been more anxious/depressed.Today provider conducted a GAD-7 and patient scored an 10, at her last visit she  scored a 11.  Provider also conducted PHQ-9 patient scored 16, at her last visit she scored a 14.  She endorses adequate sleep.  Today she denies SI/HI/AVH, mania, paranoia.   Today patient agreeable to starting Wellbutrin XL 150 mg to help manage tobacco dependence, symptoms of ADHD, and weight. Provider discussed risk of being on 3 antidepressants (serotonin syndrome and potential mania). Patient notes that she takes trazodone infrequently. Provider recommenced reducing Lexapro however patient was not agreeable. At this time Qelbree not reordered. Patient notes that if Wellbutrin is ineffective she will retry Aram Beecham. She will continue all other medications as prescribed. Potential side effects of medication and risks vs benefits of treatment vs non-treatment were explained and discussed. All questions were answered. She will continue her other medications as prescribed.  No other concerns noted at this time.      Visit Diagnosis:    ICD-10-CM   1. Attention deficit hyperactivity disorder (ADHD), predominantly inattentive type  F90.0 buPROPion (WELLBUTRIN XL) 150 MG 24 hr tablet    2. Bipolar 1 disorder (HCC)  F31.9 traZODone (DESYREL) 50 MG tablet    OLANZapine (ZYPREXA) 20 MG tablet    escitalopram (LEXAPRO) 20 MG tablet    3. Tobacco dependence  F17.200 buPROPion (WELLBUTRIN XL) 150 MG 24 hr tablet    nicotine polacrilex (NICORETTE) 2 MG gum    4. Generalized anxiety disorder  F41.1 OLANZapine (ZYPREXA) 20 MG tablet    hydrOXYzine (ATARAX) 25 MG tablet    escitalopram (LEXAPRO) 20 MG tablet        Past Psychiatric History:  depression,  ADD, bipolar 1 disorder, and anxiety  Past Medical History:  Past Medical History:  Diagnosis Date   ADD 10/22/2009   ALLERGIC RHINITIS 09/26/2008   Anemia    ANXIETY 09/26/2008   Asthma    Complication of anesthesia    "tempermental" per patient, "aggiated and hostile"   CONSTIPATION 09/26/2008   DEPRESSION 09/26/2008   FATIGUE 01/14/2009   Head  injury with loss of consciousness (HCC)    Headache    HYPERLIPIDEMIA 09/26/2008   IBS (irritable bowel syndrome)    OTITIS MEDIA, LEFT 01/14/2009   SKIN LESION 09/26/2008   Sleep apnea    TONSILLITIS, ACUTE 01/14/2009   Unconscious state (HCC) 10/13/2017   URI 10/22/2009    Past Surgical History:  Procedure Laterality Date   CERVICAL POLYPECTOMY  2009   CERVICAL POLYPECTOMY  2019   DILATATION & CURETTAGE/HYSTEROSCOPY WITH MYOSURE N/A 11/17/2017   Procedure: DILATATION & CURETTAGE/HYSTEROSCOPY WITH MYOSURE;  Surgeon: Geryl Rankins, MD;  Location: [facility];  Service: Gynecology;  Laterality: N/A;   ORIF ANKLE FRACTURE Right 08/24/2018   Procedure: OPEN REDUCTION INTERNAL FIXATION TRIMALLEOLAR ANKLE FRACTURE;  Surgeon: Eldred Manges, MD;  Location: [facility];  Service: Orthopedics;  Laterality: Right;    Family Psychiatric History: Brother depression and mother anxiety  Family History:  Family History  Problem Relation Age of Onset   Thyroid disease Mother    Hypertension Mother    Irritable bowel syndrome Mother    Clotting disorder Maternal Grandmother    Diabetes Maternal Aunt    Colon cancer Neg Hx    Esophageal cancer Neg Hx     Social History:  Social History   Socioeconomic History   Marital status: Single    Spouse name: Not on file   Number of children: 0   Years of education: Not on file   Highest education level: Not on file  Occupational History   Occupation: full time waitress  Tobacco Use   Smoking status: Every Day    Current packs/day: 1.00    Average packs/day: 1 pack/day for 20.0 years (20.0 ttl pk-yrs)    Types: Cigarettes   Smokeless tobacco: Never  Vaping Use   Vaping status: Some Days  Substance and Sexual Activity   Alcohol use: Yes    Comment: occ   Drug use: No   Sexual activity: Not Currently    Birth control/protection: Abstinence  Other Topics Concern   Not on file  Social History Narrative   Lives with mother   Social Determinants of  Health   Financial Resource Strain: Not on file  Food Insecurity: Not on file  Transportation Needs: Not on file  Physical Activity: Not on file  Stress: Not on file  Social Connections: Not on file    Allergies: No Known Allergies  Metabolic Disorder Labs: Lab Results  Component Value Date   HGBA1C 5.3 06/22/2020   MPG 105 06/22/2020   MPG 108 07/25/2019   No results found for: "PROLACTIN" Lab Results  Component Value Date   CHOL 148 06/22/2020   TRIG 111 06/22/2020   HDL 46 06/22/2020   CHOLHDL 3.2 06/22/2020   VLDL 22 06/22/2020   LDLCALC 80 06/22/2020   LDLCALC 80 07/25/2019   Lab Results  Component Value Date   TSH 3.23 08/25/2022   TSH 4.263 06/22/2020    Therapeutic Level Labs: No results found for: "LITHIUM" No results found for: "VALPROATE" Lab Results  Component Value Date   CBMZ 6.8 07/29/2019  Current Medications: Current Outpatient Medications  Medication Sig Dispense Refill   buPROPion (WELLBUTRIN XL) 150 MG 24 hr tablet Take 1 tablet (150 mg total) by mouth every morning. 30 tablet 3   acetaminophen (TYLENOL) 325 MG tablet Take 650 mg by mouth every 6 (six) hours as needed.     albuterol (VENTOLIN HFA) 108 (90 Base) MCG/ACT inhaler Inhale 2 puffs into the lungs every 6 (six) hours as needed for wheezing or shortness of breath. 6.7 g 0   Drospirenone (SLYND) 4 MG TABS Take 1 tablet (4 mg total) by mouth daily. 84 tablet 4   escitalopram (LEXAPRO) 20 MG tablet Take 1 tablet (20 mg total) by mouth daily. 30 tablet 3   hydrOXYzine (ATARAX) 25 MG tablet Take 1 tablet (25 mg total) by mouth 3 (three) times daily as needed for anxiety. 90 tablet 3   hyoscyamine (OSCIMIN) 0.125 MG tablet Take 1 tablet (0.125 mg total) by mouth every 4 (four) hours as needed for cramping. (Patient not taking: Reported on 08/25/2022) 30 tablet 1   nicotine polacrilex (NICORETTE) 2 MG gum Take 1 each (2 mg total) by mouth as needed for smoking cessation. 100 tablet 3    OLANZapine (ZYPREXA) 20 MG tablet Take 1 tablet (20 mg total) by mouth at bedtime. 30 tablet 3   traZODone (DESYREL) 50 MG tablet Take 1 tablet (50 mg total) by mouth at bedtime as needed for sleep. 30 tablet 3   No current facility-administered medications for this visit.     Musculoskeletal: Strength & Muscle Tone: within normal limits and telehealth visit Gait & Station: normal, telehealth visit Patient leans: N/A  Psychiatric Specialty Exam: Review of Systems  There were no vitals taken for this visit.There is no height or weight on file to calculate BMI.  General Appearance: Well Groomed  Eye Contact:  Good  Speech:  Clear and Coherent and Normal Rate  Volume:  Normal  Mood:  Anxious and Depressed  Affect:  Appropriate and Congruent  Thought Process:  Coherent, Goal Directed, and Linear  Orientation:  Full (Time, Place, and Person)  Thought Content: WDL and Logical   Suicidal Thoughts:  No  Homicidal Thoughts:  No  Memory:  Immediate;   Good Recent;   Good Remote;   Good  Judgement:  Good  Insight:  Good  Psychomotor Activity:  Normal  Concentration:  Concentration: Fair and Attention Span: Fair  Recall:  Good  Fund of Knowledge: Good  Language: Good  Akathisia:  No  Handed:  Right  AIMS (if indicated): not done  Assets:  Communication Skills Desire for Improvement Financial Resources/Insurance Housing Intimacy Physical Health Social Support  ADL's:  Intact  Cognition: WNL  Sleep:  Good   Screenings: AIMS    Flowsheet Row [facility] ([facility] Ad[facility] ([facility] ([facility]) [facility]  AIMS Total Score 0 0 0 0      AUDIT    Flowsheet Row Admission (Discharged) from OP  [facility] (Dis[facility]) [facility] Ad[facility] (Dis[facility] ([facility]) [facility]  Alcohol Use Disorder Identification Test Final Score (AUDIT) 0 0 4 0  GAD-7    Flowsheet Row Video Visit from 11/04/2022 in [facility] Video Visit from 08/06/2022 in [facility] Video Visit from 05/20/2022 in [facility] Video Visit from 03/05/2022 in [facility] Office Visit from 12/03/2021 in [facility]  Total GAD-7 Score 10 11 7 15 5       PHQ2-9    Flowsheet Row Video Visit from 11/04/2022 in [facility] Video Visit from 08/06/2022 in [facility] Video Visit from 05/20/2022 in [facility] Video Visit from 03/05/2022 in [facility] Office Visit from 03/02/2022 in [facility]  PHQ-2 Total Score 2 0 2 4 1   PHQ-9 Total Score 16 14 13 18  --      Flowsheet Row Clinical Support from 09/04/2020 in [facility] Counselor from 07/24/2020 in [facility] Admission (Discharged) from OP [facility]  C-SSRS RISK CATEGORY No Risk No Risk Moderate Risk        Assessment and Plan: Patient endorses symptoms of ADHD, anxiety and depression continues to be well-managed. Today patient agreeable to starting Wellbutrin XL 150 mg to help manage tobacco dependence, symptoms of ADHD, and weight. Provider discussed risk of being on 3 antidepressants (serotonin syndrome and potential mania).  Patient notes that she takes trazodone infrequently. Provider recommenced reducing Lexapro however patient was not agreeable. At this time Qelbree not reordered. Patient notes that if Wellbutrin is ineffective she will retry Aram Beecham. She will continue all other medications as prescribed.  1. Bipolar 1 disorder (HCC)  Continue- traZODone (DESYREL) 50 MG tablet; Take 1 tablet (50 mg total) by mouth at bedtime as needed for sleep.  Dispense: 30 tablet; Refill: 3 Continue- OLANZapine (ZYPREXA) 20 MG tablet; Take 1 tablet (20 mg total) by mouth at bedtime.  Dispense: 30 tablet; Refill: 3 Continue- escitalopram (LEXAPRO) 20 MG tablet; Take 1 tablet (20 mg total) by mouth daily.  Dispense: 30 tablet; Refill: 3  2. Tobacco dependence  Start- buPROPion (WELLBUTRIN XL) 150 MG 24 hr tablet; Take 1 tablet (150 mg total) by mouth every morning.  Dispense: 30 tablet; Refill: 3 Continue- nicotine polacrilex (NICORETTE) 2 MG gum; Take 1 each (2 mg total) by mouth as needed for smoking cessation.  Dispense: 100 tablet; Refill: 3  3. Generalized anxiety disorder  Continue- OLANZapine (ZYPREXA) 20 MG tablet; Take 1 tablet (20 mg total) by mouth at bedtime.  Dispense: 30 tablet; Refill: 3 Continue- hydrOXYzine (ATARAX) 25 MG tablet; Take 1 tablet (25 mg total) by mouth 3 (three) times daily as needed for anxiety.  Dispense: 90 tablet; Refill: 3 Continue- escitalopram (LEXAPRO) 20 MG tablet; Take 1 tablet (20 mg total) by mouth daily.  Dispense: 30 tablet; Refill: 3  4. Attention deficit hyperactivity disorder (ADHD), predominantly inattentive type  Start- buPROPion (WELLBUTRIN XL) 150 MG 24 hr tablet; Take 1 tablet (150 mg total) by mouth every morning.  Dispense: 30 tablet; Refill: 3   Follow up in 2.5 months Follow up with therapy  Shanna Cisco, NP 11/04/2022, 3:26 PM

## 2022-11-05 ENCOUNTER — Encounter: Payer: Self-pay | Admitting: Obstetrics and Gynecology

## 2022-11-05 ENCOUNTER — Other Ambulatory Visit: Payer: Self-pay

## 2022-11-06 ENCOUNTER — Other Ambulatory Visit: Payer: Self-pay

## 2022-11-26 ENCOUNTER — Ambulatory Visit (HOSPITAL_COMMUNITY): Payer: PRIVATE HEALTH INSURANCE | Admitting: Clinical

## 2022-11-26 ENCOUNTER — Encounter (HOSPITAL_COMMUNITY): Payer: Self-pay

## 2022-12-03 ENCOUNTER — Other Ambulatory Visit: Payer: Self-pay

## 2022-12-04 ENCOUNTER — Other Ambulatory Visit: Payer: Self-pay

## 2023-01-06 ENCOUNTER — Other Ambulatory Visit: Payer: Self-pay

## 2023-01-14 ENCOUNTER — Telehealth (HOSPITAL_COMMUNITY): Payer: PRIVATE HEALTH INSURANCE | Admitting: Psychiatry

## 2023-01-14 ENCOUNTER — Encounter (HOSPITAL_COMMUNITY): Payer: Self-pay | Admitting: Psychiatry

## 2023-01-14 ENCOUNTER — Other Ambulatory Visit: Payer: Self-pay

## 2023-01-14 DIAGNOSIS — F319 Bipolar disorder, unspecified: Secondary | ICD-10-CM

## 2023-01-14 DIAGNOSIS — F17210 Nicotine dependence, cigarettes, uncomplicated: Secondary | ICD-10-CM | POA: Diagnosis not present

## 2023-01-14 DIAGNOSIS — F17200 Nicotine dependence, unspecified, uncomplicated: Secondary | ICD-10-CM

## 2023-01-14 DIAGNOSIS — F411 Generalized anxiety disorder: Secondary | ICD-10-CM | POA: Diagnosis not present

## 2023-01-14 DIAGNOSIS — F900 Attention-deficit hyperactivity disorder, predominantly inattentive type: Secondary | ICD-10-CM

## 2023-01-14 MED ORDER — NICOTINE POLACRILEX 2 MG MT GUM
2.0000 mg | CHEWING_GUM | OROMUCOSAL | 3 refills | Status: DC | PRN
  Filled 2023-01-14: qty 100, 30d supply, fill #0

## 2023-01-14 MED ORDER — BUPROPION HCL ER (XL) 150 MG PO TB24
150.0000 mg | ORAL_TABLET | ORAL | 3 refills | Status: DC
  Filled 2023-01-14 – 2023-02-08 (×2): qty 30, 30d supply, fill #0
  Filled 2023-03-17: qty 30, 30d supply, fill #1

## 2023-01-14 MED ORDER — TRAZODONE HCL 50 MG PO TABS
50.0000 mg | ORAL_TABLET | Freq: Every evening | ORAL | 3 refills | Status: DC | PRN
  Filled 2023-01-14: qty 30, 30d supply, fill #0

## 2023-01-14 MED ORDER — OLANZAPINE 20 MG PO TABS
20.0000 mg | ORAL_TABLET | Freq: Every day | ORAL | 3 refills | Status: DC
  Filled 2023-01-14 – 2023-02-08 (×2): qty 30, 30d supply, fill #0
  Filled 2023-03-17: qty 30, 30d supply, fill #1

## 2023-01-14 MED ORDER — ESCITALOPRAM OXALATE 20 MG PO TABS
20.0000 mg | ORAL_TABLET | Freq: Every day | ORAL | 3 refills | Status: DC
  Filled 2023-01-14 – 2023-02-08 (×2): qty 30, 30d supply, fill #0
  Filled 2023-03-17: qty 30, 30d supply, fill #1

## 2023-01-14 MED ORDER — HYDROXYZINE HCL 25 MG PO TABS
25.0000 mg | ORAL_TABLET | Freq: Three times a day (TID) | ORAL | 3 refills | Status: DC | PRN
  Filled 2023-01-14 – 2023-02-08 (×2): qty 90, 30d supply, fill #0

## 2023-01-14 NOTE — Progress Notes (Signed)
[facility] MD/PA/NP OP Progress Note Virtual Visit via Video Note  I connected with [name] on 01/14/23 at  4:00 PM EST by a video enabled telemedicine application and verified that I am speaking with the correct person using two identifiers.  Location: Patient: Work Provider: Clinic   I discussed the limitations of evaluation and management by telemedicine and the availability of in person appointments. The patient expressed understanding and agreed to proceed.  I provided 30 minutes of non-face-to-face time during this encounter.     01/14/2023 4:04 PM [name]  MRN:  [id number]  Chief Complaint: "Everything is going well"    HPI: 42 year old female seen today for follow up psychiatric evaluation.    She has psychiatric history of depression, ADD, bipolar 1 disorder, and anxiety. She is currently managed on Lexapro 20 mg daily, hydroxyzine 25 mg three time daily, Zyprexa 20mg  nightly, NicoDerm 2 mg gum, Wellbutrin XL 150 mg mg daily,  and trazodone 50mg  nightly as needed. She reports that her medications are  effective in managing her psychiatric conditions.   Today she was well-groomed, pleasant, cooperative, and engaged in conversation.  She informed Clinical research associate that everything is going well.  Since starting Wellbutrin she notes that her depression and ADHD has improved.  Patient reports that she is better able to focus on task and at work.  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  She informed Clinical research associate that on her days off she sleeps over 12 hours and notes that this is concerned for her mother.  Provider asked patient if her job was physically taxing and she notes that it was.  Provider informed patient that sleeping after a 6-day workweek is acceptable.  She endorsed understanding.    Patient reports that her mood, anxiety, and depression are well-managed.  At times she reports that she feels like she is dealing with seasonal depression but is able to cope.  Today  provider conducted a GAD-7 and patient scored a 6, at her last visit she scored a 10.  Provider also conducted a PHQ-9 the patient scored an 11, at her last visit she scored a 16.  She endorses adequate appetite.  Patient notes that she believes she has gained 2 to 5 pounds since her last visit.  Today she denies SI/HI/VAH, mania, paranoia.    Patient notes that at times she has abdominal pain which she quantifies as 2 out of 10.  She informed Clinical research associate that she is able to deal with this.  Patient has not had labs in over 6 months.  Today provider ordered CBC, LFT, THS, lipid panel, CMP, UDS, prolactin level, and HgbA1c.  No medication changes made.  Patient agreeable to continue medication as prescribed.  No other concerns at this time.     Visit Diagnosis:    ICD-10-CM   1. Tobacco dependence  F17.200 buPROPion (WELLBUTRIN XL) 150 MG 24 hr tablet    nicotine polacrilex (NICORETTE) 2 MG gum    2. Attention deficit hyperactivity disorder (ADHD), predominantly inattentive type  F90.0 buPROPion (WELLBUTRIN XL) 150 MG 24 hr tablet    3. Bipolar 1 disorder (HCC)  F31.9 escitalopram (LEXAPRO) 20 MG tablet    OLANZapine (ZYPREXA) 20 MG tablet    traZODone (DESYREL) 50 MG tablet    CBC w/Diff/Platelet    Hepatic function panel    Thyroid Panel With TSH    Lipid Profile    Comprehensive Metabolic Panel (CMET)    Urine  Drug Panel 7    Prolactin    HgB A1c    4. Generalized anxiety disorder  F41.1 escitalopram (LEXAPRO) 20 MG tablet    hydrOXYzine (ATARAX) 25 MG tablet    OLANZapine (ZYPREXA) 20 MG tablet         Past Psychiatric History:  depression, ADD, bipolar 1 disorder, and anxiety  Past Medical History:  Past Medical History:  Diagnosis Date   ADD 10/22/2009   ALLERGIC RHINITIS 09/26/2008   Anemia    ANXIETY 09/26/2008   Asthma    Complication of anesthesia    "tempermental" per patient, "aggiated and hostile"   CONSTIPATION 09/26/2008   DEPRESSION 09/26/2008   FATIGUE 01/14/2009    Head injury with loss of consciousness (HCC)    Headache    HYPERLIPIDEMIA 09/26/2008   IBS (irritable bowel syndrome)    OTITIS MEDIA, LEFT 01/14/2009   SKIN LESION 09/26/2008   Sleep apnea    TONSILLITIS, ACUTE 01/14/2009   Unconscious state (HCC) 10/13/2017   URI 10/22/2009    Past Surgical History:  Procedure Laterality Date   CERVICAL POLYPECTOMY  2009   CERVICAL POLYPECTOMY  2019   DILATATION & CURETTAGE/HYSTEROSCOPY WITH MYOSURE N/A 11/17/2017   Procedure: DILATATION & CURETTAGE/HYSTEROSCOPY WITH MYOSURE;  Surgeon: Geryl Rankins, MD;  Location: [facility];  Service: Gynecology;  Laterality: N/A;   ORIF ANKLE FRACTURE Right 08/24/2018   Procedure: OPEN REDUCTION INTERNAL FIXATION TRIMALLEOLAR ANKLE FRACTURE;  Surgeon: Eldred Manges, MD;  Location: [facility];  Service: Orthopedics;  Laterality: Right;    Family Psychiatric History: Brother depression and mother anxiety  Family History:  Family History  Problem Relation Age of Onset   Thyroid disease Mother    Hypertension Mother    Irritable bowel syndrome Mother    Clotting disorder Maternal Grandmother    Diabetes Maternal Aunt    Colon cancer Neg Hx    Esophageal cancer Neg Hx     Social History:  Social History   Socioeconomic History   Marital status: Single    Spouse name: Not on file   Number of children: 0   Years of education: Not on file   Highest education level: Not on file  Occupational History   Occupation: full time waitress  Tobacco Use   Smoking status: Every Day    Current packs/day: 1.00    Average packs/day: 1 pack/day for 20.0 years (20.0 ttl pk-yrs)    Types: Cigarettes   Smokeless tobacco: Never  Vaping Use   Vaping status: Some Days  Substance and Sexual Activity   Alcohol use: Yes    Comment: occ   Drug use: No   Sexual activity: Not Currently    Birth control/protection: Abstinence  Other Topics Concern   Not on file  Social History Narrative   Lives with mother   Social Drivers of  Health   Financial Resource Strain: Not on file  Food Insecurity: Not on file  Transportation Needs: Not on file  Physical Activity: Not on file  Stress: Not on file  Social Connections: Not on file    Allergies: No Known Allergies  Metabolic Disorder Labs: Lab Results  Component Value Date   HGBA1C 5.3 06/22/2020   MPG 105 06/22/2020   MPG 108 07/25/2019   No results found for: "PROLACTIN" Lab Results  Component Value Date   CHOL 148 06/22/2020   TRIG 111 06/22/2020   HDL 46 06/22/2020   CHOLHDL 3.2 06/22/2020   VLDL 22 06/22/2020  LDLCALC 80 06/22/2020   LDLCALC 80 07/25/2019   Lab Results  Component Value Date   TSH 3.23 08/25/2022   TSH 4.263 06/22/2020    Therapeutic Level Labs: No results found for: "LITHIUM" No results found for: "VALPROATE" Lab Results  Component Value Date   CBMZ 6.8 07/29/2019    Current Medications: Current Outpatient Medications  Medication Sig Dispense Refill   acetaminophen (TYLENOL) 325 MG tablet Take 650 mg by mouth every 6 (six) hours as needed.     albuterol (VENTOLIN HFA) 108 (90 Base) MCG/ACT inhaler Inhale 2 puffs into the lungs every 6 (six) hours as needed for wheezing or shortness of breath. 6.7 g 0   buPROPion (WELLBUTRIN XL) 150 MG 24 hr tablet Take 1 tablet (150 mg total) by mouth every morning. 30 tablet 3   Drospirenone (SLYND) 4 MG TABS Take 1 tablet (4 mg total) by mouth daily. 84 tablet 4   escitalopram (LEXAPRO) 20 MG tablet Take 1 tablet (20 mg total) by mouth daily. 30 tablet 3   hydrOXYzine (ATARAX) 25 MG tablet Take 1 tablet (25 mg total) by mouth 3 (three) times daily as needed for anxiety. 90 tablet 3   hyoscyamine (OSCIMIN) 0.125 MG tablet Take 1 tablet (0.125 mg total) by mouth every 4 (four) hours as needed for cramping. (Patient not taking: Reported on 08/25/2022) 30 tablet 1   nicotine polacrilex (NICORETTE) 2 MG gum Take 1 each (2 mg total) by mouth as needed for smoking cessation. 100 tablet 3    OLANZapine (ZYPREXA) 20 MG tablet Take 1 tablet (20 mg total) by mouth at bedtime. 30 tablet 3   traZODone (DESYREL) 50 MG tablet Take 1 tablet (50 mg total) by mouth at bedtime as needed for sleep. 30 tablet 3   No current facility-administered medications for this visit.     Musculoskeletal: Strength & Muscle Tone: within normal limits and telehealth visit Gait & Station: normal, telehealth visit Patient leans: N/A  Psychiatric Specialty Exam: Review of Systems  There were no vitals taken for this visit.There is no height or weight on file to calculate BMI.  General Appearance: Well Groomed  Eye Contact:  Good  Speech:  Clear and Coherent and Normal Rate  Volume:  Normal  Mood:  Euthymic  Affect:  Appropriate and Congruent  Thought Process:  Coherent, Goal Directed, and Linear  Orientation:  Full (Time, Place, and Person)  Thought Content: WDL and Logical   Suicidal Thoughts:  No  Homicidal Thoughts:  No  Memory:  Immediate;   Good Recent;   Good Remote;   Good  Judgement:  Good  Insight:  Good  Psychomotor Activity:  Normal  Concentration:  Concentration: Good and Attention Span: Good  Recall:  Good  Fund of Knowledge: Good  Language: Good  Akathisia:  No  Handed:  Right  AIMS (if indicated): not done  Assets:  Communication Skills Desire for Improvement Financial Resources/Insurance Housing Intimacy Physical Health Social Support  ADL's:  Intact  Cognition: WNL  Sleep:  Good   Screenings: AIMS    Flowsheet Row [facility] ([facility] ([facility] ([facility]) [facility]  AIMS Total Score 0 0 0 0      AUDIT    Flowsheet Row Admission (Discharged) from [facility] Ad[facility] (Discharged) [facility] Ad[facility] (Discharged) [facility] (Dis[facility]) [facility]  Alcohol Use Disorder Identification Test Final Score (AUDIT) 0 0 4 0      GAD-7    Flowsheet Row Video Visit from 01/14/2023 in [facility] Video Visit from 11/04/2022 in [facility] Video Visit from 08/06/2022 in [facility] Video Visit from 05/20/2022 in [facility] Video Visit from 03/05/2022 in [facility]  Total GAD-7 Score 6 10 11 7 15       PHQ2-9    Flowsheet Row Video Visit from 01/14/2023 in [facility] Video Visit from 11/04/2022 in [facility] Video Visit from 08/06/2022 in [facility] Video Visit from 05/20/2022 in [facility] Video Visit from 03/05/2022 in [facility]  PHQ-2 Total Score 4 2 0 2 4  PHQ-9 Total Score 11 16 14 13 18       Flowsheet Row Clinical Support from 09/04/2020 in [facility] Counselor from 07/24/2020 in [facility] Admission (Discharged) from [facility]  C-SSRS RISK CATEGORY No Risk No Risk Moderate Risk        Assessment and Plan: Patient reports that her anxiety, depression, and mood has improved since her last visit.  She does note that at times she feels like she is dealing with seasonal depression but reports that she is able to cope with it.Patient has not had labs in over 6 months.  Today provider ordered CBC, LFT, THS, lipid panel, CMP, UDS,  prolactin level, and HgbA1c.  No medication changes made.  Patient agreeable to continue medication as prescribed.  1. Tobacco dependence  Continue- buPROPion (WELLBUTRIN XL) 150 MG 24 hr tablet; Take 1 tablet (150 mg total) by mouth every morning.  Dispense: 30 tablet; Refill: 3 Continue- nicotine polacrilex (NICORETTE) 2 MG gum; Take 1 each (2 mg total) by mouth as needed for smoking cessation.  Dispense: 100 tablet; Refill: 3  2. Attention deficit hyperactivity disorder (ADHD), predominantly inattentive type  Continue- buPROPion (WELLBUTRIN XL) 150 MG 24 hr tablet; Take 1 tablet (150 mg total) by mouth every morning.  Dispense: 30 tablet; Refill: 3  3. Bipolar 1 disorder (HCC)  Continue- escitalopram (LEXAPRO) 20 MG tablet; Take 1 tablet (20 mg total) by mouth daily.  Dispense: 30 tablet; Refill: 3 Continue- OLANZapine (ZYPREXA) 20 MG tablet; Take 1 tablet (20 mg total) by mouth at bedtime.  Dispense: 30 tablet; Refill: 3 Continue- traZODone (DESYREL) 50 MG tablet; Take 1 tablet (50 mg total) by mouth at bedtime as needed for sleep.  Dispense: 30 tablet; Refill: 3 - CBC w/Diff/Platelet; Future - Hepatic function panel; Future - Thyroid Panel With TSH; Future - Lipid Profile; Future - Comprehensive Metabolic Panel (CMET); Future - Urine Drug Panel 7; Future - Prolactin; Future - HgB A1c; Future  4. Generalized anxiety disorder  Continue- escitalopram (LEXAPRO) 20 MG tablet; Take 1 tablet (20 mg total) by mouth daily.  Dispense: 30 tablet; Refill: 3 Continue- hydrOXYzine (ATARAX) 25 MG tablet; Take 1 tablet (25 mg total) by mouth 3 (three) times daily as needed for anxiety.  Dispense: 90 tablet; Refill:  3 Continue- OLANZapine (ZYPREXA) 20 MG tablet; Take 1 tablet (20 mg total) by mouth at bedtime.  Dispense: 30 tablet; Refill: 3   Follow up in 2.5 months Follow up with therapy  Shanna Cisco, NP 01/14/2023, 4:04 PM

## 2023-01-25 ENCOUNTER — Other Ambulatory Visit: Payer: Self-pay

## 2023-02-08 ENCOUNTER — Other Ambulatory Visit: Payer: Self-pay

## 2023-02-10 ENCOUNTER — Other Ambulatory Visit: Payer: Self-pay

## 2023-02-12 ENCOUNTER — Telehealth: Payer: 59 | Admitting: Physician Assistant

## 2023-02-12 ENCOUNTER — Ambulatory Visit: Payer: Self-pay | Admitting: Internal Medicine

## 2023-02-12 ENCOUNTER — Other Ambulatory Visit: Payer: Self-pay

## 2023-02-12 DIAGNOSIS — A084 Viral intestinal infection, unspecified: Secondary | ICD-10-CM

## 2023-02-12 MED ORDER — ONDANSETRON 4 MG PO TBDP
4.0000 mg | ORAL_TABLET | Freq: Three times a day (TID) | ORAL | 0 refills | Status: AC | PRN
  Filled 2023-02-12: qty 20, 7d supply, fill #0

## 2023-02-12 MED ORDER — LOPERAMIDE HCL 2 MG PO TABS
2.0000 mg | ORAL_TABLET | Freq: Four times a day (QID) | ORAL | 0 refills | Status: AC | PRN
  Filled 2023-02-12: qty 24, 6d supply, fill #0

## 2023-02-12 NOTE — Patient Instructions (Signed)
[name], thank you for joining Margaretann Loveless, PA-C for today's virtual visit.  While this provider is not your primary care provider (PCP), if your PCP is located in our provider database this encounter information will be shared with them immediately following your visit.   A [facility] MyChart account gives you access to today's visit and all your visits, tests, and labs performed at [facility] " click here if you don't have a [facility] MyChart account or go to [email or web address]  Consent: (Patient) [name] provided verbal consent for this virtual visit at the beginning of the encounter.  Current Medications:  Current Outpatient Medications:    loperamide (IMODIUM A-D) 2 MG tablet, Take 1 tablet (2 mg total) by mouth 4 (four) times daily as needed for diarrhea or loose stools., Disp: 24 tablet, Rfl: 0   ondansetron (ZOFRAN-ODT) 4 MG disintegrating tablet, Take 1 tablet (4 mg total) by mouth every 8 (eight) hours as needed., Disp: 20 tablet, Rfl: 0   acetaminophen (TYLENOL) 325 MG tablet, Take 650 mg by mouth every 6 (six) hours as needed., Disp: , Rfl:    albuterol (VENTOLIN HFA) 108 (90 Base) MCG/ACT inhaler, Inhale 2 puffs into the lungs every 6 (six) hours as needed for wheezing or shortness of breath., Disp: 6.7 g, Rfl: 0   buPROPion (WELLBUTRIN XL) 150 MG 24 hr tablet, Take 1 tablet (150 mg total) by mouth every morning., Disp: 30 tablet, Rfl: 3   Drospirenone (SLYND) 4 MG TABS, Take 1 tablet (4 mg total) by mouth daily., Disp: 84 tablet, Rfl: 4   escitalopram (LEXAPRO) 20 MG tablet, Take 1 tablet (20 mg total) by mouth daily., Disp: 30 tablet, Rfl: 3   hydrOXYzine (ATARAX) 25 MG tablet, Take 1 tablet (25 mg total) by mouth 3 (three) times daily as needed for anxiety., Disp: 90 tablet, Rfl: 3   hyoscyamine (OSCIMIN) 0.125 MG tablet, Take 1 tablet (0.125 mg total) by mouth every 4 (four) hours as needed for cramping. (Patient not taking: Reported  on 08/25/2022), Disp: 30 tablet, Rfl: 1   nicotine polacrilex (NICORETTE) 2 MG gum, Take 1 each (2 mg total) by mouth as needed for smoking cessation., Disp: 100 tablet, Rfl: 3   OLANZapine (ZYPREXA) 20 MG tablet, Take 1 tablet (20 mg total) by mouth at bedtime., Disp: 30 tablet, Rfl: 3   traZODone (DESYREL) 50 MG tablet, Take 1 tablet (50 mg total) by mouth at bedtime as needed for sleep., Disp: 30 tablet, Rfl: 3   Medications ordered in this encounter:  Meds ordered this encounter  Medications   loperamide (IMODIUM A-D) 2 MG tablet    Sig: Take 1 tablet (2 mg total) by mouth 4 (four) times daily as needed for diarrhea or loose stools.    Dispense:  24 tablet    Refill:  0    Supervising Provider:   LAMPTEY, PHILIP O [[id number]]   ondansetron (ZOFRAN-ODT) 4 MG disintegrating tablet    Sig: Take 1 tablet (4 mg total) by mouth every 8 (eight) hours as needed.    Dispense:  20 tablet    Refill:  0    Supervising Provider:   Merrilee Jansky [[id number]]     *If you need refills on other medications prior to your next appointment, please contact your pharmacy*  Follow-Up: Call back or seek an in-person evaluation if the symptoms worsen or if the condition fails to improve as anticipated.  [facility] [phone]  Other Instructions  Viral Gastroenteritis, Adult  Viral gastroenteritis is also known as the stomach flu. This condition may affect your stomach, small intestine, and large intestine. It can cause sudden watery diarrhea, fever, and vomiting. This condition is caused by many different viruses. These viruses can be passed from person to person very easily (are contagious). Diarrhea and vomiting can make you feel weak and cause you to become dehydrated. You may not be able to keep fluids down. Dehydration can make you tired and thirsty, cause you to have a dry mouth, and decrease how often you urinate. It is important to replace the fluids that you lose from diarrhea and  vomiting. What are the causes? Gastroenteritis is caused by many viruses, including rotavirus and norovirus. Norovirus is the most common cause in adults. You can get sick after being exposed to the viruses from other people. You can also get sick by: Eating food, drinking water, or touching a surface contaminated with one of these viruses. Sharing utensils or other personal items with an infected person. What increases the risk? You are more likely to develop this condition if you: Have a weak body defense system (immune system). Live with one or more children who are younger than 2 years. Live in a nursing home. Travel on cruise ships. What are the signs or symptoms? Symptoms of this condition start suddenly 1-3 days after exposure to a virus. Symptoms may last for a few days or for as long as a week. Common symptoms include watery diarrhea and vomiting. Other symptoms include: Fever. Headache. Fatigue. Pain in the abdomen. Chills. Weakness. Nausea. Muscle aches. Loss of appetite. How is this diagnosed? This condition is diagnosed with a medical history and physical exam. You may also have a stool test to check for viruses or other infections. How is this treated? This condition typically goes away on its own. The focus of treatment is to prevent dehydration and restore lost fluids (rehydration). This condition may be treated with: An oral rehydration solution (ORS) to replace important salts and minerals (electrolytes) in your body. Take this if told by your health care provider. This is a drink that is sold at pharmacies and retail stores. Medicines to help with your symptoms. Probiotic supplements to reduce symptoms of diarrhea. Fluids given through an IV, if dehydration is severe. Older adults and people with other diseases or a weak immune system are at higher risk for dehydration. Follow these instructions at home: Eating and drinking  Take an ORS as told by your health care  provider. Drink clear fluids in small amounts as you are able. Clear fluids include: Water. Ice chips. Diluted fruit juice. Low-calorie sports drinks. Drink enough fluid to keep your urine pale yellow. Eat small amounts of healthy foods every 3-4 hours as you are able. This may include whole grains, fruits, vegetables, lean meats, and yogurt. Avoid fluids that contain a lot of sugar or caffeine, such as energy drinks, sports drinks, and soda. Avoid spicy or fatty foods. Avoid alcohol. General instructions  Wash your hands often, especially after having diarrhea or vomiting. If soap and water are not available, use hand sanitizer. Make sure that all people in your household wash their hands well and often. Take over-the-counter and prescription medicines only as told by your health care provider. Rest at home while you recover. Watch your condition for any changes. Take a warm bath to relieve any burning or pain from frequent diarrhea episodes. Keep all follow-up visits. This is important.  Contact a health care provider if you: Cannot keep fluids down. Have symptoms that get worse. Have new symptoms. Feel light-headed or dizzy. Have muscle cramps. Get help right away if you: Have chest pain. Have trouble breathing or you are breathing very quickly. Have a fast heartbeat. Feel extremely weak or you faint. Have a severe headache, a stiff neck, or both. Have a rash. Have severe pain, cramping, or bloating in your abdomen. Have skin that feels cold and clammy. Feel confused. Have pain when you urinate. Have signs of dehydration, such as: Dark urine, very little urine, or no urine. Cracked lips. Dry mouth. Sunken eyes. Sleepiness. Weakness. Have signs of bleeding, such as: Seeing blood in your vomit. Having vomit that looks like coffee grounds. Having bloody or black stools or stools that look like tar. These symptoms may be an emergency. Get help right away. Call 911. Do  not wait to see if the symptoms will go away. Do not drive yourself to the hospital. Summary Viral gastroenteritis is also known as the stomach flu. It can cause sudden watery diarrhea, fever, and vomiting. This condition can be passed from person to person very easily (is contagious). Take an oral rehydration solution (ORS) if told by your health care provider. This is a drink that is sold at pharmacies and retail stores. Wash your hands often, especially after having diarrhea or vomiting. If soap and water are not available, use hand sanitizer. This information is not intended to replace advice given to you by your health care provider. Make sure you discuss any questions you have with your health care provider. Document Revised: 11/11/2020 Document Reviewed: 11/11/2020 Elsevier Patient Education  2024 Elsevier Inc.  Irritable Bowel Syndrome, Adult  Irritable bowel syndrome (IBS) is a group of symptoms that affects the organs responsible for digestion (gastrointestinal tract, or GI tract). IBS is not one specific disease. To regulate how the GI tract works, the body sends signals back and forth between the intestines and the brain. If you have IBS, there may be a problem with these signals. As a result, the GI tract does not function normally. The intestines may become more sensitive and overreact to certain things. This may be especially true when you eat certain foods or when you are under stress. There are four main types of IBS. These may be determined based on the consistency of your stool (feces): IBS with mostly (predominance of) diarrhea. IBS with predominance of constipation. IBS with mixed bowel habits. This includes both diarrhea and constipation. IBS unclassified. This includes IBS that cannot be categorized into one of the other three main types. It is important to know which type of IBS you have. Certain treatments are more likely to be helpful for certain types of IBS. What are  the causes? The exact cause of IBS is not known. What increases the risk? You may have a higher risk for IBS if you: Are female. Are younger than 40 years. Have a family history of IBS. Have a mental health condition, such as depression, anxiety, or post-traumatic stress disorder. Have had a bacterial infection of your GI tract. What are the signs or symptoms? Symptoms of IBS vary from person to person. The main symptom is abdominal pain or discomfort. Other symptoms usually include one or more of the following: Diarrhea, constipation, or both. Swelling or bloating in the abdomen. Feeling full after eating a small or regular-sized meal. Frequent gas. Mucus in the stool. A feeling of having more stool left  after a bowel movement. Symptoms tend to come and go. They may be triggered by stress, mental health conditions, or certain foods. How is this diagnosed? This condition may be diagnosed based on a physical exam, your medical history, and your symptoms. You may have tests, such as: Blood tests. Stool test. Colonoscopy. This is a procedure in which your GI tract is viewed with a long, thin, flexible tube. How is this treated? There is no cure for IBS, but treatment can help relieve symptoms. Treatment depends on the type of IBS you have, and may include: Changes to your diet, such as: Avoiding foods that cause symptoms. Drinking more water. Following a low-FODMAP (fermentable oligosaccharides, disaccharides, monosaccharides, and polyols) diet for up to 6 weeks, or as told by your health care provider. FODMAPs are sugars that are hard for some people to digest. Eating more fiber. Eating small meals at the same times every day. Medicines. These may include: Fiber supplements, if you have constipation. Medicine to control diarrhea (antidiarrheal medicines). Medicine to help control muscle tightening (spasms) in your GI tract (antispasmodic medicines). Medicines to help with mental  health conditions, such as antidepressants. Talk therapy or counseling. Working with a dietitian to help create a food plan that is right for you. Managing your stress. Follow these instructions at home: Eating and drinking  Eat a healthy diet. Eat 5-6 small meals a day. Try to eat meals at about the same times each day. Do not eat large meals. Gradually eat more fiber-rich foods. These include whole grains, fruits, and vegetables. This may be especially helpful if you have IBS with constipation. Eat a diet low in FODMAPs. You may need to avoid foods such as citrus fruits, cabbage, garlic, and onions. Drink enough fluid to keep your urine pale yellow. Keep a journal of foods that seem to trigger symptoms. Avoid foods and drinks that: Contain added sugar. Make your symptoms worse. These may include dairy products, caffeinated drinks, and carbonated drinks. Alcohol use Do not drink alcohol if: Your health care provider tells you not to drink. You are pregnant, may be pregnant, or are planning to become pregnant. If you drink alcohol: Limit how much you have to: 0-1 drink a day for women. 0-2 drinks a day for men. Know how much alcohol is in your drink. In the [place] one drink equals one 12 oz bottle of beer (355 mL), one 5 oz glass of wine (148 mL), or one 1 oz glass of hard liquor (44 mL) General instructions Take over-the-counter and prescription medicines only as told by your health care provider. This includes supplements. Get enough exercise. Do at least 150 minutes of moderate-intensity exercise each week. Manage your stress. Getting enough sleep and exercise can help you manage stress. Keep all follow-up visits. This is important. This includes all visits with your health care provider and therapist. Where to find more information [employer] for Functional Gastrointestinal Disorders: aboutibs.[employer] of Diabetes and Digestive and Kidney Diseases:  StageSync.si Contact a health care provider if: You have constant pain. You lose weight. You have diarrhea that gets worse. You have bleeding from the rectum. You vomit often. You have a fever. Get help right away if: You have severe abdominal pain. You have diarrhea with symptoms of dehydration, such as dizziness or dry mouth. You have bloody or black stools. You have severe abdominal bloating. You have vomiting that does not stop. You have blood in your vomit. Summary Irritable bowel syndrome (IBS) is not  one specific disease. It is a group of symptoms that affects digestion. Your intestines may become more sensitive and overreact to certain things. This may be especially true when you eat certain foods or when you are under stress. There is no cure for IBS, but treatment can help relieve symptoms. This information is not intended to replace advice given to you by your health care provider. Make sure you discuss any questions you have with your health care provider. Document Revised: 12/25/2020 Document Reviewed: 12/25/2020 Elsevier Patient Education  2024 Elsevier Inc.   If you have been instructed to have an in-person evaluation today at a local Urgent Care facility, please use the link below. It will take you to a list of all of our available [facility], including address, phone number and hours of operation. Please do not delay care.  [facility]  If you or a family member do not have a primary care provider, use the link below to schedule a visit and establish care. When you choose a [facility] primary care physician or advanced practice provider, you gain a long-term partner in health. Find a Primary Care Provider  Learn more about [facility] in-office and virtual care options: [facility] - Get Care Now

## 2023-02-12 NOTE — Telephone Encounter (Signed)
Chief Complaint: Diarrhea, abdominal pain Symptoms: Diarrhea x3 episodes, abdominal pain Frequency: Constant since this morning Pertinent Negatives: Patient denies blood in stool, fever Disposition: [] ED /[] Urgent Care (no appt availability in office) / [] Appointment(In office/virtual)/ []  [facility]/ [x] Home Care/ [x] Refused Recommended Disposition /[] [facility]/ []  Follow-up with PCP Additional Notes: Patient called in stating she has been having diarrhea and abdominal pain since this morning intermittently. Patient states she is questioning if this is due to anxiety because she feels uneasy and heart racing. Advised patient on importance of hydration status. Patient symptoms manageable at home at this time. However; patient refused homecare disposition and asked for an UC appointment, stating she needs a work note. Virtual UC appointment created for today.    Copied from CRM [id number]. Topic: Clinical - Pink Word Triage >> Feb 12, 2023 10:26 AM Adele Barthel wrote: Reason for Triage: uncontrollable diarrhea started this morning. Also having consistent nausea, sweat, and body aches. No fever or vomiting. Has been using Pepto Bismol. No known triggers Reason for Disposition  MILD-MODERATE diarrhea (e.g., 1-6 times / day more than normal)  Answer Assessment - Initial Assessment Questions 1. DIARRHEA SEVERITY: "How bad is the diarrhea?" "How many more stools have you had in the past 24 hours than normal?"    - NO DIARRHEA (SCALE 0)   - MILD (SCALE 1-3): Few loose or mushy BMs; increase of 1-3 stools over normal daily number of stools; mild increase in ostomy output.   -  MODERATE (SCALE 4-7): Increase of 4-6 stools daily over normal; moderate increase in ostomy output.   -  SEVERE (SCALE 8-10; OR "WORST POSSIBLE"): Increase of 7 or more stools daily over normal; moderate increase in ostomy output; incontinence.     3 2. ONSET: "When did the diarrhea begin?"      This  morning 3. BM CONSISTENCY: "How loose or watery is the diarrhea?"      water 4. VOMITING: "Are you also vomiting?" If Yes, ask: "How many times in the past 24 hours?"      No 5. ABDOMEN PAIN: "Are you having any abdomen pain?" If Yes, ask: "What does it feel like?" (e.g., crampy, dull, intermittent, constant)      Not present at this time 6. ABDOMEN PAIN SEVERITY: If present, ask: "How bad is the pain?"  (e.g., Scale 1-10; mild, moderate, or severe)   - MILD (1-3): doesn't interfere with normal activities, abdomen soft and not tender to touch    - MODERATE (4-7): interferes with normal activities or awakens from sleep, abdomen tender to touch    - SEVERE (8-10): excruciating pain, doubled over, unable to do any normal activities       Not right now, but during diarrhea was cramping 7. ORAL INTAKE: If vomiting, "Have you been able to drink liquids?" "How much liquids have you had in the past 24 hours?"     Yes - been able to keep fluids in 8. HYDRATION: "Any signs of dehydration?" (e.g., dry mouth [not just dry lips], too weak to stand, dizziness, new weight loss) "When did you last urinate?"     Yes a little bit, I feel fatigued and weaker 9. EXPOSURE: "Have you traveled to a foreign country recently?" "Have you been exposed to anyone with diarrhea?" "Could you have eaten any food that was spoiled?"     No 10. ANTIBIOTIC USE: "Are you taking antibiotics now or have you taken antibiotics in the past 2 months?"  No 11. OTHER SYMPTOMS: "Do you have any other symptoms?" (e.g., fever, blood in stool)       No, No Fever 12. PREGNANCY: "Is there any chance you are pregnant?" "When was your last menstrual period?"       No  Protocols used: [facility]
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
## 2023-02-23 ENCOUNTER — Other Ambulatory Visit: Payer: Self-pay

## 2023-03-01 ENCOUNTER — Ambulatory Visit: Payer: 59 | Admitting: Internal Medicine

## 2023-03-18 ENCOUNTER — Other Ambulatory Visit: Payer: Self-pay

## 2023-03-19 ENCOUNTER — Other Ambulatory Visit: Payer: Self-pay

## 2023-03-24 ENCOUNTER — Telehealth (INDEPENDENT_AMBULATORY_CARE_PROVIDER_SITE_OTHER): Payer: PRIVATE HEALTH INSURANCE | Admitting: Psychiatry

## 2023-03-24 ENCOUNTER — Encounter (HOSPITAL_COMMUNITY): Payer: Self-pay | Admitting: Psychiatry

## 2023-03-24 ENCOUNTER — Other Ambulatory Visit: Payer: Self-pay

## 2023-03-24 DIAGNOSIS — F900 Attention-deficit hyperactivity disorder, predominantly inattentive type: Secondary | ICD-10-CM

## 2023-03-24 DIAGNOSIS — F411 Generalized anxiety disorder: Secondary | ICD-10-CM | POA: Diagnosis not present

## 2023-03-24 DIAGNOSIS — F17210 Nicotine dependence, cigarettes, uncomplicated: Secondary | ICD-10-CM | POA: Diagnosis not present

## 2023-03-24 DIAGNOSIS — F17200 Nicotine dependence, unspecified, uncomplicated: Secondary | ICD-10-CM | POA: Diagnosis not present

## 2023-03-24 DIAGNOSIS — F319 Bipolar disorder, unspecified: Secondary | ICD-10-CM

## 2023-03-24 MED ORDER — OLANZAPINE 20 MG PO TABS
20.0000 mg | ORAL_TABLET | Freq: Every day | ORAL | 3 refills | Status: DC
  Filled 2023-03-24 – 2023-04-29 (×2): qty 30, 30d supply, fill #0
  Filled 2023-06-08: qty 30, 30d supply, fill #1

## 2023-03-24 MED ORDER — TRAZODONE HCL 50 MG PO TABS
50.0000 mg | ORAL_TABLET | Freq: Every evening | ORAL | 3 refills | Status: DC | PRN
  Filled 2023-03-24: qty 30, 30d supply, fill #0

## 2023-03-24 MED ORDER — ESCITALOPRAM OXALATE 20 MG PO TABS
20.0000 mg | ORAL_TABLET | Freq: Every day | ORAL | 3 refills | Status: DC
Start: 2023-03-24 — End: 2023-06-17
  Filled 2023-03-24 – 2023-04-29 (×2): qty 30, 30d supply, fill #0
  Filled 2023-06-08: qty 30, 30d supply, fill #1

## 2023-03-24 MED ORDER — BUPROPION HCL ER (XL) 300 MG PO TB24
300.0000 mg | ORAL_TABLET | ORAL | 3 refills | Status: DC
  Filled 2023-03-24 – 2023-04-08 (×2): qty 30, 30d supply, fill #0
  Filled 2023-05-10 – 2023-06-08 (×2): qty 30, 30d supply, fill #1

## 2023-03-24 MED ORDER — HYDROXYZINE HCL 25 MG PO TABS
25.0000 mg | ORAL_TABLET | Freq: Three times a day (TID) | ORAL | 3 refills | Status: DC | PRN
  Filled 2023-03-24 – 2023-06-08 (×3): qty 90, 30d supply, fill #0

## 2023-03-24 MED ORDER — NICOTINE POLACRILEX 2 MG MT GUM
2.0000 mg | CHEWING_GUM | OROMUCOSAL | 3 refills | Status: DC | PRN
  Filled 2023-03-24: qty 100, 30d supply, fill #0

## 2023-03-24 NOTE — Progress Notes (Signed)
 [facility] MD/PA/NP OP Progress Note Virtual Visit via Video Note  I connected with [name] on 03/24/23 at  3:30 PM EST by a video enabled telemedicine application and verified that I am speaking with the correct person using two identifiers.  Location: Patient: Work Provider: Clinic   I discussed the limitations of evaluation and management by telemedicine and the availability of in person appointments. The patient expressed understanding and agreed to proceed.  I provided 30 minutes of non-face-to-face time during this encounter.     03/24/2023 3:56 PM [name]  MRN:  [id number]  Chief Complaint: "I have been sleeping a lot"    HPI: 43 year old female seen today for follow up psychiatric evaluation.    She has psychiatric history of depression, ADD, bipolar 1 disorder, and anxiety. She is currently managed on Lexapro 20 mg daily, hydroxyzine 25 mg three time daily, Zyprexa 20mg  nightly, NicoDerm 2 mg gum, Wellbutrin XL 150 mg mg daily,  and trazodone 50mg  nightly as needed. She reports that her medications are somewhat effective in managing her psychiatric conditions.   Today she was well-groomed, pleasant, cooperative, and engaged in conversation.  She informed Clinical research associate that she is not sleeping a lot.  Patient denies any new physical illnesses.  She also notes that work is less hectic.  She informed Clinical research associate that she is not dealing with more stressors. She also reports that she has not been taking trazodone but instead hydroxyzine nightly. Provider informed patient to stop hydroxyzine nightly until sleep improves. She does note that she feels more depressed since her last visit.  Patient informed writer that she sleeps approximately 10 hours nightly and naps throughout the day.  Provider recommended that patient get labs drawn that were ordered at her last visit.  She endorsed understanding and notes that she would.    Since her last visit she informed writer that her anxiety is well  managed but notes her depression has increased. Today provider conducted a GAD-7 and patient scored a 5, at her last visit she scored a 6.  Provider also conducted a PHQ-9 the patient scored an 14, at her last visit she scored a 11.  She endorses adequate appetite.  Patient notes that she believes she has gained a few pounds since her last visit.  Today she denies SI/HI/VAH, mania, paranoia.    Patient has not had labs in over 6 months. Provider reordered CBC, LFT, THS, lipid panel, CMP, UDS, prolactin level, and HgbA1c.Wellbutrin 150 mg daily increased to 30 daily to help manage depression. She will continue all other medications as prescribed. No other concerns at this time.     Visit Diagnosis:    ICD-10-CM   1. Bipolar 1 disorder (HCC)  F31.9 HgB A1c    Prolactin    Urine Drug Panel 7    Comprehensive Metabolic Panel (CMET)    Lipid Profile    Thyroid Panel With TSH    Hepatic function panel    CBC w/Diff/Platelet    escitalopram (LEXAPRO) 20 MG tablet    OLANZapine (ZYPREXA) 20 MG tablet    traZODone (DESYREL) 50 MG tablet    CBC w/Diff/Platelet    Comprehensive Metabolic Panel (CMET)    Hepatic function panel    Thyroid Panel With TSH    Lipid Profile    Urine Drug Panel 7    Lipid Profile    Urine Drug Panel 7    Thyroid Panel With TSH    Hepatic function panel  Comprehensive Metabolic Panel (CMET)    CBC w/Diff/Platelet    2. Tobacco dependence  F17.200 buPROPion (WELLBUTRIN XL) 300 MG 24 hr tablet    nicotine polacrilex (NICORETTE) 2 MG gum    3. Attention deficit hyperactivity disorder (ADHD), predominantly inattentive type  F90.0 buPROPion (WELLBUTRIN XL) 300 MG 24 hr tablet    4. Generalized anxiety disorder  F41.1 escitalopram (LEXAPRO) 20 MG tablet    hydrOXYzine (ATARAX) 25 MG tablet    OLANZapine (ZYPREXA) 20 MG tablet          Past Psychiatric History:  depression, ADD, bipolar 1 disorder, and anxiety  Past Medical History:  Past Medical History:   Diagnosis Date   ADD 10/22/2009   ALLERGIC RHINITIS 09/26/2008   Anemia    ANXIETY 09/26/2008   Asthma    Complication of anesthesia    "tempermental" per patient, "aggiated and hostile"   CONSTIPATION 09/26/2008   DEPRESSION 09/26/2008   FATIGUE 01/14/2009   Head injury with loss of consciousness (HCC)    Headache    HYPERLIPIDEMIA 09/26/2008   IBS (irritable bowel syndrome)    OTITIS MEDIA, LEFT 01/14/2009   SKIN LESION 09/26/2008   Sleep apnea    TONSILLITIS, ACUTE 01/14/2009   Unconscious state (HCC) 10/13/2017   URI 10/22/2009    Past Surgical History:  Procedure Laterality Date   CERVICAL POLYPECTOMY  2009   CERVICAL POLYPECTOMY  2019   DILATATION & CURETTAGE/HYSTEROSCOPY WITH MYOSURE N/A 11/17/2017   Procedure: DILATATION & CURETTAGE/HYSTEROSCOPY WITH MYOSURE;  Surgeon: Geryl Rankins, MD;  Location: [facility];  Service: Gynecology;  Laterality: N/A;   ORIF ANKLE FRACTURE Right 08/24/2018   Procedure: OPEN REDUCTION INTERNAL FIXATION TRIMALLEOLAR ANKLE FRACTURE;  Surgeon: Eldred Manges, MD;  Location: [facility];  Service: Orthopedics;  Laterality: Right;    Family Psychiatric History: Brother depression and mother anxiety  Family History:  Family History  Problem Relation Age of Onset   Thyroid disease Mother    Hypertension Mother    Irritable bowel syndrome Mother    Clotting disorder Maternal Grandmother    Diabetes Maternal Aunt    Colon cancer Neg Hx    Esophageal cancer Neg Hx     Social History:  Social History   Socioeconomic History   Marital status: Single    Spouse name: Not on file   Number of children: 0   Years of education: Not on file   Highest education level: Not on file  Occupational History   Occupation: full time waitress  Tobacco Use   Smoking status: Every Day    Current packs/day: 1.00    Average packs/day: 1 pack/day for 20.0 years (20.0 ttl pk-yrs)    Types: Cigarettes   Smokeless tobacco: Never  Vaping Use   Vaping status: Some Days   Substance and Sexual Activity   Alcohol use: Yes    Comment: occ   Drug use: No   Sexual activity: Not Currently    Birth control/protection: Abstinence  Other Topics Concern   Not on file  Social History Narrative   Lives with mother   Social Drivers of Health   Financial Resource Strain: Not on file  Food Insecurity: Not on file  Transportation Needs: Not on file  Physical Activity: Not on file  Stress: Not on file  Social Connections: Not on file    Allergies: No Known Allergies  Metabolic Disorder Labs: Lab Results  Component Value Date   HGBA1C 5.3 06/22/2020   MPG 105 06/22/2020  MPG 108 07/25/2019   No results found for: "PROLACTIN" Lab Results  Component Value Date   CHOL 148 06/22/2020   TRIG 111 06/22/2020   HDL 46 06/22/2020   CHOLHDL 3.2 06/22/2020   VLDL 22 06/22/2020   LDLCALC 80 06/22/2020   LDLCALC 80 07/25/2019   Lab Results  Component Value Date   TSH 3.23 08/25/2022   TSH 4.263 06/22/2020    Therapeutic Level Labs: No results found for: "LITHIUM" No results found for: "VALPROATE" Lab Results  Component Value Date   CBMZ 6.8 07/29/2019    Current Medications: Current Outpatient Medications  Medication Sig Dispense Refill   acetaminophen (TYLENOL) 325 MG tablet Take 650 mg by mouth every 6 (six) hours as needed.     albuterol (VENTOLIN HFA) 108 (90 Base) MCG/ACT inhaler Inhale 2 puffs into the lungs every 6 (six) hours as needed for wheezing or shortness of breath. 6.7 g 0   buPROPion (WELLBUTRIN XL) 300 MG 24 hr tablet Take 1 tablet (300 mg total) by mouth every morning. 30 tablet 3   Drospirenone (SLYND) 4 MG TABS Take 1 tablet (4 mg total) by mouth daily. 84 tablet 4   escitalopram (LEXAPRO) 20 MG tablet Take 1 tablet (20 mg total) by mouth daily. 30 tablet 3   hydrOXYzine (ATARAX) 25 MG tablet Take 1 tablet (25 mg total) by mouth 3 (three) times daily as needed for anxiety. 90 tablet 3   hyoscyamine (OSCIMIN) 0.125 MG tablet Take  1 tablet (0.125 mg total) by mouth every 4 (four) hours as needed for cramping. (Patient not taking: Reported on 08/25/2022) 30 tablet 1   loperamide (IMODIUM A-D) 2 MG tablet Take 1 tablet (2 mg total) by mouth 4 (four) times daily as needed for diarrhea or loose stools. 24 tablet 0   nicotine polacrilex (NICORETTE) 2 MG gum Take 1 each (2 mg total) by mouth as needed for smoking cessation. 100 tablet 3   OLANZapine (ZYPREXA) 20 MG tablet Take 1 tablet (20 mg total) by mouth at bedtime. 30 tablet 3   ondansetron (ZOFRAN-ODT) 4 MG disintegrating tablet Take 1 tablet (4 mg total) by mouth every 8 (eight) hours as needed. 20 tablet 0   traZODone (DESYREL) 50 MG tablet Take 1 tablet (50 mg total) by mouth at bedtime as needed for sleep. 30 tablet 3   No current facility-administered medications for this visit.     Musculoskeletal: Strength & Muscle Tone: within normal limits and telehealth visit Gait & Station: normal, telehealth visit Patient leans: N/A  Psychiatric Specialty Exam: Review of Systems  There were no vitals taken for this visit.There is no height or weight on file to calculate BMI.  General Appearance: Well Groomed  Eye Contact:  Good  Speech:  Clear and Coherent and Normal Rate  Volume:  Normal  Mood:  Depressed  Affect:  Appropriate and Congruent  Thought Process:  Coherent, Goal Directed, and Linear  Orientation:  Full (Time, Place, and Person)  Thought Content: WDL and Logical   Suicidal Thoughts:  No  Homicidal Thoughts:  No  Memory:  Immediate;   Good Recent;   Good Remote;   Good  Judgement:  Good  Insight:  Good  Psychomotor Activity:  Normal  Concentration:  Concentration: Good and Attention Span: Good  Recall:  Good  Fund of Knowledge: Good  Language: Good  Akathisia:  No  Handed:  Right  AIMS (if indicated): not done  Assets:  Communication Skills Desire for Improvement  Financial Resources/Insurance Housing Intimacy Physical Health Social Support   ADL's:  Intact  Cognition: WNL  Sleep:  Poor   Screenings: AIMS    Flowsheet Row [facility]) [facility]  AIMS Total Score 0 0 0 0      AUDIT    Flowsheet Row Admission (Discharged) [facility] ([facility]) [facility] Ad[facility] (Discharged[facility] ([facility]) [facility]  Alcohol Use Disorder Identification Test Final Score (AUDIT) 0 0 4 0      GAD-7    Flowsheet Row Video Visit from 03/24/2023 in [facility] Video Visit from 01/14/2023 in [facility] Video Visit from 11/04/2022 in [facility] Video Visit from 08/06/2022 in [facility] Video Visit from 05/20/2022 in [facility]  Total GAD-7 Score 5 6 10 11 7       PHQ2-9    Flowsheet Row Video Visit from 03/24/2023 in [facility] Video Visit from 01/14/2023 in [facility] Video Visit from 11/04/2022 in [facility] Video Visit from 08/06/2022 in [facility] Video Visit from 05/20/2022 in [facility]  PHQ-2 Total Score 2 4 2  0 2  PHQ-9 Total Score 14 11 16 14 13       Flowsheet Row Clinical Support from 09/04/2020 in [facility] Counselor from 07/24/2020 in [facility] Admission (Discharged) from [facility]  C-SSRS RISK CATEGORY No Risk No Risk Moderate Risk        Assessment and Plan: Patient reports that her anxiety is well managed but notes that her depression and sleep have worsened. Patient has not had labs in over 6 months. Provider reordered CBC, LFT, THS, lipid panel, CMP, UDS, prolactin level, and HgbA1c.Wellbutrin 150 mg daily increased to 30 daily to help manage depression. She will continue all other medications as prescribed  1. Bipolar 1 disorder (HCC)  - HgB A1c - Prolactin - Urine Drug Panel 7 - Comprehensive Metabolic Panel (CMET) - Lipid Profile - Thyroid Panel With TSH - Hepatic function panel - CBC w/Diff/Platelet Continue- escitalopram (LEXAPRO) 20 MG tablet; Take 1 tablet (20 mg total) by mouth daily.  Dispense: 30 tablet; Refill: 3 Continue- OLANZapine (ZYPREXA) 20 MG tablet; Take 1 tablet (20 mg total) by mouth at bedtime.  Dispense: 30 tablet; Refill: 3 Continue- traZODone (DESYREL) 50 MG tablet; Take 1 tablet (50 mg total) by mouth at bedtime as needed for sleep.  Dispense: 30 tablet; Refill: 3  2. Tobacco dependence  - buPROPion (WELLBUTRIN XL) 300 MG 24 hr tablet; Take 1 tablet (300 mg total) by mouth every morning.  Dispense: 30 tablet; Refill: 3 Continue- nicotine polacrilex (NICORETTE) 2 MG gum; Take 1 each (2 mg total) by mouth as needed for smoking cessation.  Dispense: 100 tablet; Refill: 3  3. Attention deficit hyperactivity disorder (ADHD), predominantly inattentive type  Increased- buPROPion (WELLBUTRIN XL) 300 MG 24  hr tablet; Take 1 tablet (300 mg total) by mouth every morning.  Dispense: 30 tablet; Refill: 3  4. Generalized anxiety disorder  Continue- escitalopram (LEXAPRO) 20 MG tablet; Take 1 tablet (20 mg total) by mouth daily.  Dispense: 30 tablet; Refill: 3 Continue- hydrOXYzine (ATARAX) 25 MG tablet; Take 1  tablet (25 mg total) by mouth 3 (three) times daily as needed for anxiety.  Dispense: 90 tablet; Refill: 3 Continue- OLANZapine (ZYPREXA) 20 MG tablet; Take 1 tablet (20 mg total) by mouth at bedtime.  Dispense: 30 tablet; Refill: 3    Follow up in 2.5 months Follow up with therapy  Shanna Cisco, NP 03/24/2023, 3:56 PM

## 2023-04-02 ENCOUNTER — Other Ambulatory Visit: Payer: Self-pay

## 2023-04-09 ENCOUNTER — Other Ambulatory Visit: Payer: Self-pay

## 2023-04-15 DIAGNOSIS — H9311 Tinnitus, right ear: Secondary | ICD-10-CM | POA: Insufficient documentation

## 2023-04-15 DIAGNOSIS — H93291 Other abnormal auditory perceptions, right ear: Secondary | ICD-10-CM | POA: Insufficient documentation

## 2023-04-29 ENCOUNTER — Other Ambulatory Visit: Payer: Self-pay

## 2023-04-30 ENCOUNTER — Other Ambulatory Visit: Payer: Self-pay

## 2023-05-07 ENCOUNTER — Other Ambulatory Visit: Payer: Self-pay

## 2023-05-10 ENCOUNTER — Other Ambulatory Visit: Payer: Self-pay

## 2023-05-17 ENCOUNTER — Encounter: Payer: Self-pay | Admitting: Internal Medicine

## 2023-05-17 ENCOUNTER — Ambulatory Visit (INDEPENDENT_AMBULATORY_CARE_PROVIDER_SITE_OTHER): Payer: MEDICAID | Admitting: Internal Medicine

## 2023-05-17 VITALS — BP 126/82 | HR 99 | Temp 98.1°F | Ht 69.25 in | Wt 336.0 lb

## 2023-05-17 DIAGNOSIS — E538 Deficiency of other specified B group vitamins: Secondary | ICD-10-CM

## 2023-05-17 DIAGNOSIS — E785 Hyperlipidemia, unspecified: Secondary | ICD-10-CM

## 2023-05-17 DIAGNOSIS — R7303 Prediabetes: Secondary | ICD-10-CM

## 2023-05-17 DIAGNOSIS — Z0001 Encounter for general adult medical examination with abnormal findings: Secondary | ICD-10-CM

## 2023-05-17 DIAGNOSIS — R739 Hyperglycemia, unspecified: Secondary | ICD-10-CM

## 2023-05-17 DIAGNOSIS — Z0000 Encounter for general adult medical examination without abnormal findings: Secondary | ICD-10-CM

## 2023-05-17 DIAGNOSIS — E559 Vitamin D deficiency, unspecified: Secondary | ICD-10-CM | POA: Diagnosis not present

## 2023-05-17 DIAGNOSIS — R7989 Other specified abnormal findings of blood chemistry: Secondary | ICD-10-CM | POA: Diagnosis not present

## 2023-05-17 DIAGNOSIS — F302 Manic episode, severe with psychotic symptoms: Secondary | ICD-10-CM

## 2023-05-17 DIAGNOSIS — R198 Other specified symptoms and signs involving the digestive system and abdomen: Secondary | ICD-10-CM | POA: Diagnosis not present

## 2023-05-17 DIAGNOSIS — E7800 Pure hypercholesterolemia, unspecified: Secondary | ICD-10-CM

## 2023-05-17 LAB — HEPATIC FUNCTION PANEL
ALT: 21 U/L (ref 0–35)
AST: 16 U/L (ref 0–37)
Albumin: 4.6 g/dL (ref 3.5–5.2)
Alkaline Phosphatase: 128 U/L — ABNORMAL HIGH (ref 39–117)
Bilirubin, Direct: 0.1 mg/dL (ref 0.0–0.3)
Total Bilirubin: 0.4 mg/dL (ref 0.2–1.2)
Total Protein: 7.4 g/dL (ref 6.0–8.3)

## 2023-05-17 LAB — CBC WITH DIFFERENTIAL/PLATELET
Basophils Absolute: 0.1 10*3/uL (ref 0.0–0.1)
Basophils Relative: 0.9 % (ref 0.0–3.0)
Eosinophils Absolute: 0.3 10*3/uL (ref 0.0–0.7)
Eosinophils Relative: 2.5 % (ref 0.0–5.0)
HCT: 43.4 % (ref 36.0–46.0)
Hemoglobin: 14.3 g/dL (ref 12.0–15.0)
Lymphocytes Relative: 25.7 % (ref 12.0–46.0)
Lymphs Abs: 2.8 10*3/uL (ref 0.7–4.0)
MCHC: 32.8 g/dL (ref 30.0–36.0)
MCV: 92 fl (ref 78.0–100.0)
Monocytes Absolute: 0.5 10*3/uL (ref 0.1–1.0)
Monocytes Relative: 4.3 % (ref 3.0–12.0)
Neutro Abs: 7.3 10*3/uL (ref 1.4–7.7)
Neutrophils Relative %: 66.6 % (ref 43.0–77.0)
Platelets: 307 10*3/uL (ref 150.0–400.0)
RBC: 4.72 Mil/uL (ref 3.87–5.11)
RDW: 14.6 % (ref 11.5–15.5)
WBC: 10.9 10*3/uL — ABNORMAL HIGH (ref 4.0–10.5)

## 2023-05-17 LAB — VITAMIN D 25 HYDROXY (VIT D DEFICIENCY, FRACTURES): VITD: 10.4 ng/mL — ABNORMAL LOW (ref 30.00–100.00)

## 2023-05-17 LAB — LIPID PANEL
Cholesterol: 224 mg/dL — ABNORMAL HIGH (ref 0–200)
HDL: 43.2 mg/dL (ref >39.00)
LDL Cholesterol: 143 mg/dL — ABNORMAL HIGH (ref 0–99)
NonHDL: 180.37
Total CHOL/HDL Ratio: 5
Triglycerides: 186 mg/dL — ABNORMAL HIGH (ref 0.0–149.0)
VLDL: 37.2 mg/dL (ref 0.0–40.0)

## 2023-05-17 LAB — VITAMIN B12: Vitamin B-12: 434 pg/mL (ref 211–911)

## 2023-05-17 LAB — BASIC METABOLIC PANEL WITH GFR
BUN: 7 mg/dL (ref 6–23)
CO2: 27 meq/L (ref 19–32)
Calcium: 9.5 mg/dL (ref 8.4–10.5)
Chloride: 102 meq/L (ref 96–112)
Creatinine, Ser: 0.73 mg/dL (ref 0.40–1.20)
GFR: 102.36 mL/min (ref >60.00)
Glucose, Bld: 97 mg/dL (ref 70–99)
Potassium: 3.9 meq/L (ref 3.5–5.1)
Sodium: 137 meq/L (ref 135–145)

## 2023-05-17 LAB — T4, FREE: Free T4: 0.78 ng/dL (ref 0.60–1.60)

## 2023-05-17 LAB — HEMOGLOBIN A1C: Hgb A1c MFr Bld: 5.5 % (ref 4.6–6.5)

## 2023-05-17 LAB — TSH: TSH: 2.02 u[IU]/mL (ref 0.35–5.50)

## 2023-05-17 NOTE — Progress Notes (Signed)
 Patient ID: [name], female   DOB: 07-31-1980, 43 y.o.   MRN: [id number]         Chief Complaint:: wellness exam and diarrhea, bipolar, hld, preDM       HPI:  [name] is a 43 y.o. female here for wellness exam; for hep c screen, declines prevnar 20, o/w up to date                        Also Pt denies chest pain, increased sob or doe, wheezing, orthopnea, PND, increased LE swelling, palpitations, dizziness or syncope.   Pt denies polydipsia, polyuria, or new focal neuro s/s.    Pt denies fever, wt loss, night sweats, loss of appetite, or other constitutional symptoms  Denies worsening reflux, dysphagia, n/v or blood but continues to have alternating diarrhea constipation and generalized abd pain.  Was referred per GI for CTAP in July 2024 but did not follow up.  Pt sees psychiatry regularly, but asks also for counseling referral.   Also needs work note as she has been out of work since apr 18, wants to go back apr 23.     Wt Readings from Last 3 Encounters:  05/17/23 (!) 336 lb (152.4 kg)  10/22/22 (!) 318 lb 9.6 oz (144.5 kg)  08/25/22 (!) 315 lb (142.9 kg)   BP Readings from Last 3 Encounters:  05/17/23 126/82  10/22/22 134/75  08/25/22 126/68   Immunization History  Administered Date(s) Administered   Influenza,inj,Quad PF,6+ Mos 10/20/2016, 11/11/2017   Td 10/22/2009   Tdap 05/11/2015   Health Maintenance Due  Topic Date Due   Hepatitis C Screening  Never done   Pneumococcal Vaccine 35-68 Years old (1 of 2 - PCV) Never done      Past Medical History:  Diagnosis Date   ADD 10/22/2009   ALLERGIC RHINITIS 09/26/2008   Anemia    ANXIETY 09/26/2008   Asthma    Complication of anesthesia    "tempermental" per patient, "aggiated and hostile"   CONSTIPATION 09/26/2008   DEPRESSION 09/26/2008   FATIGUE 01/14/2009   Head injury with loss of consciousness (HCC)    Headache    HYPERLIPIDEMIA 09/26/2008   IBS (irritable bowel syndrome)    OTITIS MEDIA, LEFT 01/14/2009    SKIN LESION 09/26/2008   Sleep apnea    TONSILLITIS, ACUTE 01/14/2009   Unconscious state (HCC) 10/13/2017   URI 10/22/2009   Past Surgical History:  Procedure Laterality Date   CERVICAL POLYPECTOMY  2009   CERVICAL POLYPECTOMY  2019   DILATATION & CURETTAGE/HYSTEROSCOPY WITH MYOSURE N/A 11/17/2017   Procedure: DILATATION & CURETTAGE/HYSTEROSCOPY WITH MYOSURE;  Surgeon: Johnn Najjar, MD;  Location: [facility];  Service: Gynecology;  Laterality: N/A;   ORIF ANKLE FRACTURE Right 08/24/2018   Procedure: OPEN REDUCTION INTERNAL FIXATION TRIMALLEOLAR ANKLE FRACTURE;  Surgeon: Adah Acron, MD;  Location: [facility];  Service: Orthopedics;  Laterality: Right;    reports that she has been smoking cigarettes. She has a 20 pack-year smoking history. She has never used smokeless tobacco. She reports current alcohol use. She reports that she does not use drugs. family history includes Clotting disorder in her maternal grandmother; Diabetes in her maternal aunt; Hypertension in her mother; Irritable bowel syndrome in her mother; Thyroid  disease in her mother. No Known Allergies Current Outpatient Medications on File Prior to Visit  Medication Sig Dispense Refill   acetaminophen  (TYLENOL ) 325 MG tablet Take 650 mg by mouth every  6 (six) hours as needed.     albuterol  (VENTOLIN  HFA) 108 (90 Base) MCG/ACT inhaler Inhale 2 puffs into the lungs every 6 (six) hours as needed for wheezing or shortness of breath. 6.7 g 0   buPROPion  (WELLBUTRIN  XL) 300 MG 24 hr tablet Take 1 tablet (300 mg total) by mouth every morning. 30 tablet 3   Drospirenone  (SLYND ) 4 MG TABS Take 1 tablet (4 mg total) by mouth daily. 84 tablet 4   escitalopram  (LEXAPRO ) 20 MG tablet Take 1 tablet (20 mg total) by mouth daily. 30 tablet 3   hydrOXYzine  (ATARAX ) 25 MG tablet Take 1 tablet (25 mg total) by mouth 3 (three) times daily as needed for anxiety. 90 tablet 3   hyoscyamine  (OSCIMIN ) 0.125 MG tablet Take 1 tablet (0.125 mg total) by mouth  every 4 (four) hours as needed for cramping. 30 tablet 1   loperamide  (IMODIUM  A-D) 2 MG tablet Take 1 tablet (2 mg total) by mouth 4 (four) times daily as needed for diarrhea or loose stools. 24 tablet 0   nicotine  polacrilex (NICORETTE ) 2 MG gum Take 1 each (2 mg total) by mouth as needed for smoking cessation. 100 tablet 3   OLANZapine  (ZYPREXA ) 20 MG tablet Take 1 tablet (20 mg total) by mouth at bedtime. 30 tablet 3   ondansetron  (ZOFRAN -ODT) 4 MG disintegrating tablet Take 1 tablet (4 mg total) by mouth every 8 (eight) hours as needed. 20 tablet 0   traZODone  (DESYREL ) 50 MG tablet Take 1 tablet (50 mg total) by mouth at bedtime as needed for sleep. 30 tablet 3   No current facility-administered medications on file prior to visit.        ROS:  All others reviewed and negative.  Objective        PE:  BP 126/82 (BP Location: Right Arm, Patient Position: Sitting, Cuff Size: Normal)   Pulse 99   Temp 98.1 F (36.7 C) (Oral)   Ht 5' 9.25" (1.759 m)   Wt (!) 336 lb (152.4 kg)   LMP 05/10/2023 (Exact Date)   SpO2 96%   BMI 49.27 kg/m                 Constitutional: Pt appears in NAD               HENT: Head: NCAT.                Right Ear: External ear normal.                 Left Ear: External ear normal.                Eyes: . Pupils are equal, round, and reactive to light. Conjunctivae and EOM are normal               Nose: without d/c or deformity               Neck: Neck supple. Gross normal ROM               Cardiovascular: Normal rate and regular rhythm.                 Pulmonary/Chest: Effort normal and breath sounds without rales or wheezing.                Abd:  Soft, diffuse mild tender, ND, + BS, no organomegaly, no rebound  Neurological: Pt is alert. At baseline orientation, motor grossly intact               Skin: Skin is warm. No rashes, no other new lesions, LE edema - none               Psychiatric: Pt behavior is normal without agitation   Micro:  none  Cardiac tracings I have personally interpreted today:  none  Pertinent Radiological findings (summarize): none   Lab Results  Component Value Date   WBC 13.9 (H) 08/25/2022   HGB 13.8 08/25/2022   HCT 42.4 08/25/2022   PLT 307.0 08/25/2022   GLUCOSE 98 08/25/2022   CHOL 148 06/22/2020   TRIG 111 06/22/2020   HDL 46 06/22/2020   LDLCALC 80 06/22/2020   ALT 26 08/25/2022   AST 21 08/25/2022   NA 137 08/25/2022   K 3.6 08/25/2022   CL 100 08/25/2022   CREATININE 0.79 08/25/2022   BUN 10 08/25/2022   CO2 27 08/25/2022   TSH 3.23 08/25/2022   HGBA1C 5.3 06/22/2020   Assessment/Plan:  GEORGANNE SIPLE is a 43 y.o. White or Caucasian [1] female with  has a past medical history of ADD (10/22/2009), ALLERGIC RHINITIS (09/26/2008), Anemia, ANXIETY (09/26/2008), Asthma, Complication of anesthesia, CONSTIPATION (09/26/2008), DEPRESSION (09/26/2008), FATIGUE (01/14/2009), Head injury with loss of consciousness (HCC), Headache, HYPERLIPIDEMIA (09/26/2008), IBS (irritable bowel syndrome), OTITIS MEDIA, LEFT (01/14/2009), SKIN LESION (09/26/2008), Sleep apnea, TONSILLITIS, ACUTE (01/14/2009), Unconscious state (HCC) (10/13/2017), and URI (10/22/2009).  Encounter for well adult exam with abnormal findings Age and sex appropriate education and counseling updated with regular exercise and diet Referrals for preventative services - none needed Immunizations addressed - none needed Smoking counseling  - none needed Evidence for depression or other mood disorder - also for counseling referral Most recent labs reviewed. I have personally reviewed and have noted: 1) the patient's medical and social history 2) The patient's current medications and supplements 3) The patient's height, weight, and BMI have been recorded in the chart   Bipolar I disorder, single manic episode, severe, with psychosis (HCC) Pt to continue f/u with psychiatry  HLD (hyperlipidemia) Lab Results  Component Value Date    LDLCALC 80 06/22/2020   Stable, pt to continue current statin  - low chol diet   Prediabetes Lab Results  Component Value Date   HGBA1C 5.3 06/22/2020   Stable, pt to continue current medical treatment  - diet, wt control   Alternating constipation and diarrhea Ok for work note, also refer GI per pt request as she plans to f/u with the CTAP  Followup: Return in about 6 months (around 11/16/2023).  Rosalia Colonel, MD 05/17/2023 3:52 PM [facility]

## 2023-05-17 NOTE — Assessment & Plan Note (Signed)
 Pt to continue f/u with psychiatry

## 2023-05-17 NOTE — Assessment & Plan Note (Signed)
 Lab Results  Component Value Date   HGBA1C 5.3 06/22/2020   Stable, pt to continue current medical treatment  - diet, wt control

## 2023-05-17 NOTE — Assessment & Plan Note (Signed)
 Age and sex appropriate education and counseling updated with regular exercise and diet Referrals for preventative services - none needed Immunizations addressed - none needed Smoking counseling  - none needed Evidence for depression or other mood disorder - also for counseling referral Most recent labs reviewed. I have personally reviewed and have noted: 1) the patient's medical and social history 2) The patient's current medications and supplements 3) The patient's height, weight, and BMI have been recorded in the chart

## 2023-05-17 NOTE — Patient Instructions (Signed)
 Ok for the work note  Please continue all other medications as before, and refills have been done if requested.  Please have the pharmacy call with any other refills you may need.  Please continue your efforts at being more active, low cholesterol diet, and weight control.  You are otherwise up to date with prevention measures today.  Please keep your appointments with your specialists as you may have planned  You will be contacted regarding the referral for: counseling  Please go to the LAB at the blood drawing area for the tests to be done  You will be contacted by phone if any changes need to be made immediately.  Otherwise, you will receive a letter about your results with an explanation, but please check with MyChart first.  Please make an Appointment to return in 6 months, or sooner if needed

## 2023-05-17 NOTE — Assessment & Plan Note (Signed)
 Lab Results  Component Value Date   LDLCALC 80 06/22/2020   Stable, pt to continue current statin  - low chol diet

## 2023-05-17 NOTE — Assessment & Plan Note (Signed)
 Ok for work note, also refer GI per pt request as she plans to f/u with the CTAP

## 2023-05-18 ENCOUNTER — Other Ambulatory Visit: Payer: Self-pay

## 2023-05-18 LAB — MICROALBUMIN / CREATININE URINE RATIO
Creatinine,U: 237.3 mg/dL
Microalb Creat Ratio: 6.1 mg/g (ref 0.0–30.0)
Microalb, Ur: 1.5 mg/dL (ref 0.0–1.9)

## 2023-05-19 ENCOUNTER — Other Ambulatory Visit: Payer: Self-pay

## 2023-05-19 LAB — URINALYSIS, ROUTINE W REFLEX MICROSCOPIC
Hgb urine dipstick: NEGATIVE
Ketones, ur: NEGATIVE
Leukocytes,Ua: NEGATIVE
Nitrite: NEGATIVE
RBC / HPF: NONE SEEN (ref 0–?)
Specific Gravity, Urine: 1.02 (ref 1.000–1.030)
Urine Glucose: NEGATIVE
Urobilinogen, UA: 0.2 (ref 0.0–1.0)
pH: 7 (ref 5.0–8.0)

## 2023-06-08 ENCOUNTER — Other Ambulatory Visit: Payer: Self-pay

## 2023-06-09 ENCOUNTER — Other Ambulatory Visit: Payer: Self-pay

## 2023-06-17 ENCOUNTER — Other Ambulatory Visit: Payer: Self-pay

## 2023-06-17 ENCOUNTER — Encounter (HOSPITAL_COMMUNITY): Payer: Self-pay | Admitting: Psychiatry

## 2023-06-17 ENCOUNTER — Telehealth (INDEPENDENT_AMBULATORY_CARE_PROVIDER_SITE_OTHER): Payer: MEDICAID | Admitting: Psychiatry

## 2023-06-17 DIAGNOSIS — F900 Attention-deficit hyperactivity disorder, predominantly inattentive type: Secondary | ICD-10-CM | POA: Diagnosis not present

## 2023-06-17 DIAGNOSIS — F411 Generalized anxiety disorder: Secondary | ICD-10-CM

## 2023-06-17 DIAGNOSIS — F319 Bipolar disorder, unspecified: Secondary | ICD-10-CM | POA: Diagnosis not present

## 2023-06-17 DIAGNOSIS — F17200 Nicotine dependence, unspecified, uncomplicated: Secondary | ICD-10-CM

## 2023-06-17 DIAGNOSIS — F17210 Nicotine dependence, cigarettes, uncomplicated: Secondary | ICD-10-CM | POA: Diagnosis not present

## 2023-06-17 MED ORDER — HYDROXYZINE HCL 25 MG PO TABS
25.0000 mg | ORAL_TABLET | Freq: Three times a day (TID) | ORAL | 3 refills | Status: DC | PRN
  Filled 2023-06-17 – 2023-11-02 (×2): qty 90, 30d supply, fill #0
  Filled 2023-12-04: qty 90, 30d supply, fill #1

## 2023-06-17 MED ORDER — TRAZODONE HCL 50 MG PO TABS
50.0000 mg | ORAL_TABLET | Freq: Every evening | ORAL | 3 refills | Status: DC | PRN
  Filled 2023-06-17 – 2023-12-06 (×2): qty 30, 30d supply, fill #0

## 2023-06-17 MED ORDER — OLANZAPINE 20 MG PO TABS
20.0000 mg | ORAL_TABLET | Freq: Every day | ORAL | 3 refills | Status: DC
  Filled 2023-06-17 – 2023-07-21 (×2): qty 30, 30d supply, fill #0
  Filled 2023-08-23: qty 30, 30d supply, fill #1
  Filled 2023-09-24: qty 30, 30d supply, fill #2
  Filled 2023-11-02: qty 30, 30d supply, fill #3

## 2023-06-17 MED ORDER — NICOTINE POLACRILEX 2 MG MT GUM
2.0000 mg | CHEWING_GUM | OROMUCOSAL | 3 refills | Status: DC | PRN
  Filled 2023-06-17: qty 100, 30d supply, fill #0

## 2023-06-17 MED ORDER — ESCITALOPRAM OXALATE 20 MG PO TABS
20.0000 mg | ORAL_TABLET | Freq: Every day | ORAL | 3 refills | Status: DC
  Filled 2023-06-17 – 2023-07-21 (×2): qty 30, 30d supply, fill #0
  Filled 2023-08-23: qty 30, 30d supply, fill #1
  Filled 2023-09-24: qty 30, 30d supply, fill #2
  Filled 2023-11-02: qty 30, 30d supply, fill #3

## 2023-06-17 MED ORDER — BUPROPION HCL ER (XL) 300 MG PO TB24
300.0000 mg | ORAL_TABLET | ORAL | 3 refills | Status: AC
Start: 2023-06-17 — End: ?
  Filled 2023-06-17 – 2023-07-21 (×2): qty 30, 30d supply, fill #0
  Filled 2023-09-24: qty 30, 30d supply, fill #1
  Filled 2023-11-02: qty 30, 30d supply, fill #2
  Filled 2023-12-06: qty 30, 30d supply, fill #3

## 2023-06-17 NOTE — Progress Notes (Signed)
 [facility] MD/PA/NP OP Progress Note Virtual Visit via Video Note  I connected with [name] on 06/17/23 at  3:30 PM EDT by a video enabled telemedicine application and verified that I am speaking with the correct person using two identifiers.  Location: Patient: Home Provider: Clinic   I discussed the limitations of evaluation and management by telemedicine and the availability of in person appointments. The patient expressed understanding and agreed to proceed.  I provided 30 minutes of non-face-to-face time during this encounter.     06/17/2023 3:45 PM [name]  MRN:  [id number]  Chief Complaint: "I have been tired"    HPI: 43 year old female seen today for follow up psychiatric evaluation.    She has psychiatric history of depression, ADD, bipolar 1 disorder, and anxiety. She is currently managed on Lexapro  20 mg daily, hydroxyzine  25 mg three time daily, Zyprexa  20mg  nightly, NicoDerm 2 mg gum, Wellbutrin  XL 300 mg mg daily,  and trazodone  50mg  nightly as needed. She reports that her medications are effective in managing her psychiatric conditions.   Today she was well-groomed, pleasant, cooperative, and engaged in conversation.  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  Patient does note that her work schedule has increased.  Provider reviewed patient recent labs with patient and informed her that her cholesterol was elevated and she has a vitamin D  deficiency.  Provider recommended patient taking over-the-counter vitamin D  tablets.  Provider also reviewed Dr. Rosalia Colonel note and he recommended "Vitamin D3 at 2000 units per day, indefinitely, and also please follow a lower cholesterol diet".  Patient endorsed understanding and notes that she would implement this into her daily routine.   Since her last visit she reports her anxiety and depression are well-managed.   Today provider conducted a GAD-7 and patient scored a 4, at her last visit she scored a 5.  Provider also  conducted a PHQ-9 the patient scored an 12, at her last visit she scored a 14.  She endorses increased weight appetite.  Patient notes that she believes she has gained a few pounds since her last visit.  Today she denies SI/HI/VAH, mania, paranoia.  Patient notes that she sleeps 10 or more hours per day.  She denies alcohol or drug use.  No medication changes made today.  Patient agreeable to continue medication as prescribed. Patient will take OTC vitamin D3 at 2000 units per day and follow a lower cholesterol diet. No other concerns at this time.     Visit Diagnosis:    ICD-10-CM   1. Tobacco dependence  F17.200 nicotine  polacrilex (NICORETTE ) 2 MG gum    buPROPion  (WELLBUTRIN  XL) 300 MG 24 hr tablet    2. Bipolar 1 disorder (HCC)  F31.9 escitalopram  (LEXAPRO ) 20 MG tablet    OLANZapine  (ZYPREXA ) 20 MG tablet    traZODone  (DESYREL ) 50 MG tablet    3. Generalized anxiety disorder  F41.1 escitalopram  (LEXAPRO ) 20 MG tablet    OLANZapine  (ZYPREXA ) 20 MG tablet    hydrOXYzine  (ATARAX ) 25 MG tablet    4. Attention deficit hyperactivity disorder (ADHD), predominantly inattentive type  F90.0 buPROPion  (WELLBUTRIN  XL) 300 MG 24 hr tablet           Past Psychiatric History:  depression, ADD, bipolar 1 disorder, and anxiety  Past Medical History:  Past Medical History:  Diagnosis Date   ADD 10/22/2009   ALLERGIC RHINITIS 09/26/2008   Anemia    ANXIETY 09/26/2008   Asthma    Complication  of anesthesia    "tempermental" per patient, "aggiated and hostile"   CONSTIPATION 09/26/2008   DEPRESSION 09/26/2008   FATIGUE 01/14/2009   Head injury with loss of consciousness (HCC)    Headache    HYPERLIPIDEMIA 09/26/2008   IBS (irritable bowel syndrome)    OTITIS MEDIA, LEFT 01/14/2009   SKIN LESION 09/26/2008   Sleep apnea    TONSILLITIS, ACUTE 01/14/2009   Unconscious state (HCC) 10/13/2017   URI 10/22/2009    Past Surgical History:  Procedure Laterality Date   CERVICAL POLYPECTOMY  2009    CERVICAL POLYPECTOMY  2019   DILATATION & CURETTAGE/HYSTEROSCOPY WITH MYOSURE N/A 11/17/2017   Procedure: DILATATION & CURETTAGE/HYSTEROSCOPY WITH MYOSURE;  Surgeon: Johnn Najjar, MD;  Location: [facility];  Service: Gynecology;  Laterality: N/A;   ORIF ANKLE FRACTURE Right 08/24/2018   Procedure: OPEN REDUCTION INTERNAL FIXATION TRIMALLEOLAR ANKLE FRACTURE;  Surgeon: Adah Acron, MD;  Location: [facility];  Service: Orthopedics;  Laterality: Right;    Family Psychiatric History: Brother depression and mother anxiety  Family History:  Family History  Problem Relation Age of Onset   Thyroid  disease Mother    Hypertension Mother    Irritable bowel syndrome Mother    Clotting disorder Maternal Grandmother    Diabetes Maternal Aunt    Colon cancer Neg Hx    Esophageal cancer Neg Hx     Social History:  Social History   Socioeconomic History   Marital status: Single    Spouse name: Not on file   Number of children: 0   Years of education: Not on file   Highest education level: Not on file  Occupational History   Occupation: full time waitress  Tobacco Use   Smoking status: Every Day    Current packs/day: 1.00    Average packs/day: 1 pack/day for 20.0 years (20.0 ttl pk-yrs)    Types: Cigarettes   Smokeless tobacco: Never  Vaping Use   Vaping status: Some Days  Substance and Sexual Activity   Alcohol use: Yes    Comment: occ   Drug use: No   Sexual activity: Not Currently    Birth control/protection: Abstinence  Other Topics Concern   Not on file  Social History Narrative   Lives with mother   Social Drivers of Health   Financial Resource Strain: Not on file  Food Insecurity: Not on file  Transportation Needs: Not on file  Physical Activity: Not on file  Stress: Not on file  Social Connections: Not on file    Allergies: No Known Allergies  Metabolic Disorder Labs: Lab Results  Component Value Date   HGBA1C 5.5 05/17/2023   MPG 105 06/22/2020   MPG 108  07/25/2019   No results found for: "PROLACTIN" Lab Results  Component Value Date   CHOL 224 (H) 05/17/2023   TRIG 186.0 (H) 05/17/2023   HDL 43.20 05/17/2023   CHOLHDL 5 05/17/2023   VLDL 37.2 05/17/2023   LDLCALC 143 (H) 05/17/2023   LDLCALC 80 06/22/2020   Lab Results  Component Value Date   TSH 2.02 05/17/2023   TSH 3.23 08/25/2022    Therapeutic Level Labs: No results found for: "LITHIUM" No results found for: "VALPROATE" Lab Results  Component Value Date   CBMZ 6.8 07/29/2019    Current Medications: Current Outpatient Medications  Medication Sig Dispense Refill   acetaminophen  (TYLENOL ) 325 MG tablet Take 650 mg by mouth every 6 (six) hours as needed.     albuterol  (VENTOLIN  HFA) 108 (90  Base) MCG/ACT inhaler Inhale 2 puffs into the lungs every 6 (six) hours as needed for wheezing or shortness of breath. 6.7 g 0   buPROPion  (WELLBUTRIN  XL) 300 MG 24 hr tablet Take 1 tablet (300 mg total) by mouth every morning. 30 tablet 3   Drospirenone  (SLYND ) 4 MG TABS Take 1 tablet (4 mg total) by mouth daily. 84 tablet 4   escitalopram  (LEXAPRO ) 20 MG tablet Take 1 tablet (20 mg total) by mouth daily. 30 tablet 3   hydrOXYzine  (ATARAX ) 25 MG tablet Take 1 tablet (25 mg total) by mouth 3 (three) times daily as needed for anxiety. 90 tablet 3   hyoscyamine  (OSCIMIN ) 0.125 MG tablet Take 1 tablet (0.125 mg total) by mouth every 4 (four) hours as needed for cramping. 30 tablet 1   loperamide  (IMODIUM  A-D) 2 MG tablet Take 1 tablet (2 mg total) by mouth 4 (four) times daily as needed for diarrhea or loose stools. 24 tablet 0   nicotine  polacrilex (NICORETTE ) 2 MG gum Take 1 each (2 mg total) by mouth as needed for smoking cessation. 100 tablet 3   OLANZapine  (ZYPREXA ) 20 MG tablet Take 1 tablet (20 mg total) by mouth at bedtime. 30 tablet 3   ondansetron  (ZOFRAN -ODT) 4 MG disintegrating tablet Take 1 tablet (4 mg total) by mouth every 8 (eight) hours as needed. 20 tablet 0   traZODone   (DESYREL ) 50 MG tablet Take 1 tablet (50 mg total) by mouth at bedtime as needed for sleep. 30 tablet 3   No current facility-administered medications for this visit.     Musculoskeletal: Strength & Muscle Tone: within normal limits and telehealth visit Gait & Station: normal, telehealth visit Patient leans: N/A  Psychiatric Specialty Exam: Review of Systems  There were no vitals taken for this visit.There is no height or weight on file to calculate BMI.  General Appearance: Well Groomed  Eye Contact:  Good  Speech:  Clear and Coherent and Normal Rate  Volume:  Normal  Mood:  Euthymic  Affect:  Appropriate and Congruent  Thought Process:  Coherent, Goal Directed, and Linear  Orientation:  Full (Time, Place, and Person)  Thought Content: WDL and Logical   Suicidal Thoughts:  No  Homicidal Thoughts:  No  Memory:  Immediate;   Good Recent;   Good Remote;   Good  Judgement:  Good  Insight:  Good  Psychomotor Activity:  Normal  Concentration:  Concentration: Good and Attention Span: Good  Recall:  Good  Fund of Knowledge: Good  Language: Good  Akathisia:  No  Handed:  Right  AIMS (if indicated): not done  Assets:  Communication Skills Desire for Improvement Financial Resources/Insurance Housing Intimacy Physical Health Social Support  ADL's:  Intact  Cognition: WNL  Sleep:  Fair   Screenings: AIMS    Flowsheet Row [facility] Ad[facility] (Dis[facility] Ad[facility] ([facility]) [facility]  AIMS Total Score 0 0 0 0      AUDIT    Flowsheet Row Admission (Discharged) [facility]) [facility] (Dis[facility] Ad[facility] (Dis[facility]) [facility]  Alcohol Use  Disorder Identification Test Final Score (AUDIT) 0 0 4 0      GAD-7    Flowsheet Row Video Visit from 06/17/2023 in [facility] Video Visit from 03/24/2023 in [facility] Video Visit from 01/14/2023 in [facility] Video Visit from 11/04/2022 in [facility] Video Visit from 08/06/2022 in [facility]  Total GAD-7 Score 4 5 6 10 11       PHQ2-9    Flowsheet Row Video Visit from 06/17/2023 in [facility] Office Visit from 05/17/2023 in [facility] Video Visit from 03/24/2023 in [facility] Video Visit from 01/14/2023 in [facility] Video Visit from 11/04/2022 in [facility]  PHQ-2 Total Score 2 0 2 4 2   PHQ-9 Total Score 12 -- 14 11 16       Flowsheet Row Clinical Support from 09/04/2020 in [facility] Counselor from 07/24/2020 in [facility] Admission (Discharged) from [facility]  C-SSRS RISK CATEGORY No Risk No Risk Moderate Risk        Assessment and Plan: Patient reports that her depression and anxiety are well managed. She does complain of being tired. No medication changes made today.  Patient agreeable to continue medication as prescribed. Patient will take OTC vitamin D3 at 2000 units per day and follow a lower cholesterol diet to help manage fatigue and cholesterol.  1. Tobacco dependence  Continue- nicotine  polacrilex (NICORETTE ) 2 MG gum; Take 1 each (2 mg total)  by mouth as needed for smoking cessation.  Dispense: 100 tablet; Refill: 3 Continue- buPROPion  (WELLBUTRIN  XL) 300 MG 24 hr tablet; Take 1 tablet (300 mg total) by mouth every morning.  Dispense: 30 tablet; Refill: 3  2. Bipolar 1 disorder (HCC)  Continue- escitalopram  (LEXAPRO ) 20 MG tablet; Take 1 tablet (20 mg total) by mouth daily.  Dispense: 30 tablet; Refill: 3 Continue- OLANZapine  (ZYPREXA ) 20 MG tablet; Take 1 tablet (20 mg total) by mouth at bedtime.  Dispense: 30 tablet; Refill: 3 Continue- traZODone  (DESYREL ) 50 MG tablet; Take 1 tablet (50 mg total) by mouth at bedtime as needed for sleep.  Dispense: 30 tablet; Refill: 3  3. Generalized anxiety disorder  Continue- escitalopram  (LEXAPRO ) 20 MG tablet; Take 1 tablet (20 mg total) by mouth daily.  Dispense: 30 tablet; Refill: 3 Continue- OLANZapine  (ZYPREXA ) 20 MG tablet; Take 1 tablet (20 mg total) by mouth at bedtime.  Dispense: 30 tablet; Refill: 3 Continue- hydrOXYzine  (ATARAX ) 25 MG tablet; Take 1 tablet (25 mg total) by mouth 3 (three) times daily as needed for anxiety.  Dispense: 90 tablet; Refill: 3  4. Attention deficit hyperactivity disorder (ADHD), predominantly inattentive type  Continue- buPROPion  (WELLBUTRIN  XL) 300 MG 24 hr tablet; Take 1 tablet (300 mg total) by mouth every morning.  Dispense: 30 tablet; Refill: 3   Follow up in 2.5 months Follow up with therapy  Arlyne Bering, NP 06/17/2023, 3:45 PM

## 2023-06-29 ENCOUNTER — Other Ambulatory Visit: Payer: Self-pay

## 2023-07-20 ENCOUNTER — Other Ambulatory Visit: Payer: Self-pay

## 2023-07-20 MED ORDER — NORETHINDRONE 0.35 MG PO TABS
1.0000 | ORAL_TABLET | Freq: Every day | ORAL | 4 refills | Status: AC
  Filled 2023-07-20: qty 84, 84d supply, fill #0
  Filled 2023-10-12: qty 84, 84d supply, fill #1
  Filled 2023-12-09: qty 28, 28d supply, fill #2
  Filled 2024-01-06: qty 28, 28d supply, fill #3
  Filled 2024-02-07: qty 28, 28d supply, fill #4

## 2023-07-21 ENCOUNTER — Other Ambulatory Visit: Payer: Self-pay

## 2023-08-25 ENCOUNTER — Other Ambulatory Visit: Payer: Self-pay

## 2023-09-02 ENCOUNTER — Telehealth (HOSPITAL_COMMUNITY): Payer: PRIVATE HEALTH INSURANCE | Admitting: Psychiatry

## 2023-09-06 ENCOUNTER — Telehealth (HOSPITAL_COMMUNITY): Payer: PRIVATE HEALTH INSURANCE | Admitting: Psychiatry

## 2023-09-09 ENCOUNTER — Encounter (HOSPITAL_COMMUNITY): Payer: Self-pay | Admitting: Family

## 2023-09-09 ENCOUNTER — Telehealth (INDEPENDENT_AMBULATORY_CARE_PROVIDER_SITE_OTHER): Payer: PRIVATE HEALTH INSURANCE | Admitting: Family

## 2023-09-09 DIAGNOSIS — F411 Generalized anxiety disorder: Secondary | ICD-10-CM | POA: Diagnosis not present

## 2023-09-09 DIAGNOSIS — F900 Attention-deficit hyperactivity disorder, predominantly inattentive type: Secondary | ICD-10-CM

## 2023-09-09 DIAGNOSIS — F17200 Nicotine dependence, unspecified, uncomplicated: Secondary | ICD-10-CM | POA: Diagnosis not present

## 2023-09-09 DIAGNOSIS — F319 Bipolar disorder, unspecified: Secondary | ICD-10-CM | POA: Diagnosis not present

## 2023-09-09 NOTE — Progress Notes (Signed)
 [facility] MD/PA/NP OP Progress Note Virtual Visit via Video Note  I connected with [name] on 09/09/23 at 11:00 AM EDT by a video enabled telemedicine application and verified that I am speaking with the correct person using two identifiers.  Location: Patient: Home Provider: Clinic   I discussed the limitations of evaluation and management by telemedicine and the availability of in person appointments. The patient expressed understanding and agreed to proceed.  I provided 30 minutes of non-face-to-face time during this encounter.   09/09/2023 3:29 PM [name]  MRN:  [id number]  Chief Complaint: I have been tired    HPI: 43 year old female seen today for follow up psychiatric evaluation.    She has psychiatric history of depression, ADD, bipolar 1 disorder, and anxiety. She is currently managed on Lexapro  20 mg daily, hydroxyzine  25 mg three time daily, Zyprexa  20mg  nightly, NicoDerm 2 mg gum, Wellbutrin  XL 300 mg mg daily,  and trazodone  50mg  nightly as needed. She reports that her medications are effective in managing her psychiatric conditions.   Patient presents well-groomed, pleasant, cooperative, and engaged in conversation. She reports that overall, things are going well with no major changes. She describes herself as being in a good mental place, with stable mood and very minimal anxiety or depression. She states she has been enjoying returning to her normal routine and is having fun engaging in daily activities. 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 She reports that her current medications have been effective. She is compliant with her regimen and rarely takes trazodone , as she typically sleeps well, though she notes intermittent nighttime awakenings. Despite this, she averages six to eight hours of sleep per night. Appetite is good; she reports gaining approximately 20 pounds over the past year and expresses interest in a  weight loss program. She continues to smoke about one pack of cigarettes daily. Patient denies suicidal ideation, homicidal ideation, auditory or visual hallucinations, mania, or paranoia.  Objective: Patient is alert and oriented to person, place, time, and situation. Well-groomed and appropriately dressed. Maintains good eye contact and is cooperative throughout the interview. Thought process is logical and goal-directed. Mood appears euthymic with congruent affect. Speech is clear, coherent, and of normal rate and volume. No abnormal movements or psychomotor agitation/retardation noted. No perceptual disturbances observed or reported. Insight and judgment appear intact. Discussed the impact of tobacco use on mental health and reinforced the importance of maintaining adequate vitamin D  intake. No medication changes made today.  Patient agreeable to continue medication as prescribed. Patient will take OTC vitamin D3 at 2000 units per day and follow a lower cholesterol diet. No other concerns at this time.     Visit Diagnosis:    ICD-10-CM   1. Bipolar 1 disorder (HCC)  F31.9     2. Tobacco dependence  F17.200     3. Generalized anxiety disorder  F41.1     4. Attention deficit hyperactivity disorder (ADHD), predominantly inattentive type  F90.0       Past Psychiatric History:  depression, ADD, bipolar 1 disorder, and anxiety  Past Medical History:  Past Medical History:  Diagnosis Date   ADD 10/22/2009   ALLERGIC RHINITIS 09/26/2008   Anemia    ANXIETY 09/26/2008   Asthma    Complication of anesthesia    tempermental per patient, aggiated and hostile   CONSTIPATION 09/26/2008   DEPRESSION 09/26/2008   FATIGUE 01/14/2009   Head injury with loss of  consciousness (HCC)    Headache    HYPERLIPIDEMIA 09/26/2008   IBS (irritable bowel syndrome)    OTITIS MEDIA, LEFT 01/14/2009   SKIN LESION 09/26/2008   Sleep apnea    TONSILLITIS, ACUTE 01/14/2009   Unconscious state (HCC) 10/13/2017    URI 10/22/2009    Past Surgical History:  Procedure Laterality Date   CERVICAL POLYPECTOMY  2009   CERVICAL POLYPECTOMY  2019   DILATATION & CURETTAGE/HYSTEROSCOPY WITH MYOSURE N/A 11/17/2017   Procedure: DILATATION & CURETTAGE/HYSTEROSCOPY WITH MYOSURE;  Surgeon: Timmie Norris, MD;  Location: [facility];  Service: Gynecology;  Laterality: N/A;   ORIF ANKLE FRACTURE Right 08/24/2018   Procedure: OPEN REDUCTION INTERNAL FIXATION TRIMALLEOLAR ANKLE FRACTURE;  Surgeon: Barbarann Oneil BROCKS, MD;  Location: [facility];  Service: Orthopedics;  Laterality: Right;    Family Psychiatric History: Brother depression and mother anxiety  Family History:  Family History  Problem Relation Age of Onset   Thyroid  disease Mother    Hypertension Mother    Irritable bowel syndrome Mother    Clotting disorder Maternal Grandmother    Diabetes Maternal Aunt    Colon cancer Neg Hx    Esophageal cancer Neg Hx     Social History:  Social History   Socioeconomic History   Marital status: Single    Spouse name: Not on file   Number of children: 0   Years of education: Not on file   Highest education level: Not on file  Occupational History   Occupation: full time waitress  Tobacco Use   Smoking status: Every Day    Current packs/day: 1.00    Average packs/day: 1 pack/day for 20.0 years (20.0 ttl pk-yrs)    Types: Cigarettes   Smokeless tobacco: Never  Vaping Use   Vaping status: Some Days  Substance and Sexual Activity   Alcohol use: Yes    Comment: occ   Drug use: No   Sexual activity: Not Currently    Birth control/protection: Abstinence  Other Topics Concern   Not on file  Social History Narrative   Lives with mother   Social Drivers of Health   Financial Resource Strain: Not on file  Food Insecurity: Not on file  Transportation Needs: Not on file  Physical Activity: Not on file  Stress: Not on file  Social Connections: Not on file    Allergies: No Known Allergies  Metabolic Disorder  Labs: Lab Results  Component Value Date   HGBA1C 5.5 05/17/2023   MPG 105 06/22/2020   MPG 108 07/25/2019   No results found for: PROLACTIN Lab Results  Component Value Date   CHOL 224 (H) 05/17/2023   TRIG 186.0 (H) 05/17/2023   HDL 43.20 05/17/2023   CHOLHDL 5 05/17/2023   VLDL 37.2 05/17/2023   LDLCALC 143 (H) 05/17/2023   LDLCALC 80 06/22/2020   Lab Results  Component Value Date   TSH 2.02 05/17/2023   TSH 3.23 08/25/2022    Therapeutic Level Labs: No results found for: LITHIUM No results found for: VALPROATE Lab Results  Component Value Date   CBMZ 6.8 07/29/2019    Current Medications: Current Outpatient Medications  Medication Sig Dispense Refill   acetaminophen  (TYLENOL ) 325 MG tablet Take 650 mg by mouth every 6 (six) hours as needed.     albuterol  (VENTOLIN  HFA) 108 (90 Base) MCG/ACT inhaler Inhale 2 puffs into the lungs every 6 (six) hours as needed for wheezing or shortness of breath. 6.7 g 0   buPROPion  (WELLBUTRIN  XL) 300  MG 24 hr tablet Take 1 tablet (300 mg total) by mouth every morning. 30 tablet 3   Drospirenone  (SLYND ) 4 MG TABS Take 1 tablet (4 mg total) by mouth daily. 84 tablet 4   escitalopram  (LEXAPRO ) 20 MG tablet Take 1 tablet (20 mg total) by mouth daily. 30 tablet 3   hydrOXYzine  (ATARAX ) 25 MG tablet Take 1 tablet (25 mg total) by mouth 3 (three) times daily as needed for anxiety. 90 tablet 3   hyoscyamine  (OSCIMIN ) 0.125 MG tablet Take 1 tablet (0.125 mg total) by mouth every 4 (four) hours as needed for cramping. 30 tablet 1   loperamide  (IMODIUM  A-D) 2 MG tablet Take 1 tablet (2 mg total) by mouth 4 (four) times daily as needed for diarrhea or loose stools. 24 tablet 0   nicotine  polacrilex (NICORETTE ) 2 MG gum Take 1 each (2 mg total) by mouth as needed for smoking cessation. 100 tablet 3   norethindrone  (MICRONOR ) 0.35 MG tablet Take 1 tablet (0.35 mg total) by mouth daily. 84 tablet 4   OLANZapine  (ZYPREXA ) 20 MG tablet Take 1  tablet (20 mg total) by mouth at bedtime. 30 tablet 3   ondansetron  (ZOFRAN -ODT) 4 MG disintegrating tablet Take 1 tablet (4 mg total) by mouth every 8 (eight) hours as needed. 20 tablet 0   traZODone  (DESYREL ) 50 MG tablet Take 1 tablet (50 mg total) by mouth at bedtime as needed for sleep. 30 tablet 3   No current facility-administered medications for this visit.     Musculoskeletal: Strength & Muscle Tone: within normal limits and telehealth visit Gait & Station: normal, telehealth visit Patient leans: N/A  Psychiatric Specialty Exam: Review of Systems  Constitutional:  Positive for appetite change (increased) and unexpected weight change (20lbs increase over 1 year).    There were no vitals taken for this visit.There is no height or weight on file to calculate BMI.  General Appearance: Well Groomed  Eye Contact:  Good  Speech:  Clear and Coherent and Normal Rate  Volume:  Normal  Mood:  Euthymic  Affect:  Appropriate and Congruent  Thought Process:  Coherent, Goal Directed, and Linear  Orientation:  Full (Time, Place, and Person)  Thought Content: WDL and Logical   Suicidal Thoughts:  No  Homicidal Thoughts:  No  Memory:  Immediate;   Good Recent;   Good Remote;   Good  Judgement:  Good  Insight:  Good  Psychomotor Activity:  Normal  Concentration:  Concentration: Good and Attention Span: Good  Recall:  Good  Fund of Knowledge: Good  Language: Good  Akathisia:  No  Handed:  Right  AIMS (if indicated): not done  Assets:  Communication Skills Desire for Improvement Financial Resources/Insurance Housing Intimacy Physical Health Social Support  ADL's:  Intact  Cognition: WNL  Sleep:  Fair   Screenings: AIMS    Flowsheet Row [facility]  AIMS Total Score 0 0 0 0   AUDIT    Flowsheet Row Admission (Discharged) from [facility] Ad[facility] (Dis[facility] Ad[facility] (Dis[facility]) [facility] ([facility]) [facility]  Alcohol Use Disorder Identification Test Final Score (AUDIT) 0 0 4 0   GAD-7    Flowsheet Row Video Visit from 06/17/2023 in [facility] Video Visit from 03/24/2023 in [facility] Video Visit from 01/14/2023 in [facility] Video Visit from 11/04/2022 in [facility] Video Visit from 08/06/2022 in [facility]  Total GAD-7 Score 4 5 6 10 11    PHQ2-9    Flowsheet Row Video Visit from 06/17/2023 in [facility] Office Visit from 05/17/2023 in [facility] Video Visit from 03/24/2023 in [facility] Video Visit from 01/14/2023 in [facility] Video Visit from 11/04/2022 in [facility]  PHQ-2 Total Score 2 0 2 4 2   PHQ-9 Total Score 12 -- 14 11 16    Flowsheet Row Clinical Support from 09/04/2020 in [facility] Counselor from 07/24/2020 in [facility] Admission (Discharged) from [facility]  C-SSRS RISK CATEGORY No Risk No Risk Moderate Risk     Assessment and Plan: Patient reports that her depression and anxiety are well managed. She does complain of being tired. No medication changes made  today.  Patient agreeable to continue medication as prescribed. Patient will take OTC vitamin D3 at 2000 units per day and follow a lower cholesterol diet to help manage fatigue and cholesterol.  1. Tobacco dependence  Continue- nicotine  polacrilex (NICORETTE ) 2 MG gum; Take 1 each (2 mg total) by mouth as needed for smoking cessation.  Dispense: 100 tablet; Refill: 3 Continue- buPROPion  (WELLBUTRIN  XL) 300 MG 24 hr tablet; Take 1 tablet (300 mg total) by mouth every morning.  Dispense: 30 tablet; Refill: 3  2. Bipolar 1 disorder (HCC)  Continue- escitalopram  (LEXAPRO ) 20 MG tablet; Take 1 tablet (20 mg total) by mouth daily.  Dispense: 30 tablet; Refill: 3 Continue- OLANZapine  (ZYPREXA ) 20 MG tablet; Take 1 tablet (20 mg total) by mouth at bedtime.  Dispense: 30 tablet; Refill: 3 Continue- traZODone  (DESYREL ) 50 MG tablet; Take 1 tablet (50 mg total) by mouth at bedtime as needed for sleep.  Dispense: 30 tablet; Refill: 3  3. Generalized anxiety disorder  Continue- escitalopram  (LEXAPRO ) 20 MG tablet; Take 1 tablet (20 mg total) by mouth daily.  Dispense: 30 tablet; Refill: 3 Continue- OLANZapine  (ZYPREXA ) 20 MG tablet; Take 1 tablet (20 mg total) by mouth at bedtime.  Dispense: 30 tablet; Refill: 3 Continue- hydrOXYzine  (ATARAX ) 25 MG tablet; Take 1 tablet (25 mg total) by mouth 3 (three) times daily as needed for anxiety.  Dispense: 90 tablet; Refill: 3  4. Attention deficit hyperactivity disorder (ADHD), predominantly inattentive type  Continue- buPROPion  (WELLBUTRIN  XL) 300 MG 24 hr tablet; Take 1 tablet (300 mg total) by mouth every morning.  Dispense: 30 tablet; Refill: 3   Follow up in 2.5 months Follow up with therapy  Majel GORMAN Ramp, FNP 09/09/2023, 3:29 PM

## 2023-09-24 ENCOUNTER — Other Ambulatory Visit: Payer: Self-pay

## 2023-10-12 ENCOUNTER — Other Ambulatory Visit: Payer: Self-pay

## 2023-10-18 ENCOUNTER — Ambulatory Visit: Payer: Self-pay

## 2023-10-18 ENCOUNTER — Ambulatory Visit (INDEPENDENT_AMBULATORY_CARE_PROVIDER_SITE_OTHER): Payer: MEDICAID | Admitting: Internal Medicine

## 2023-10-18 ENCOUNTER — Encounter: Payer: Self-pay | Admitting: Internal Medicine

## 2023-10-18 ENCOUNTER — Other Ambulatory Visit: Payer: Self-pay

## 2023-10-18 VITALS — BP 120/62 | HR 103 | Temp 98.2°F | Ht 69.25 in | Wt 337.4 lb

## 2023-10-18 DIAGNOSIS — F17200 Nicotine dependence, unspecified, uncomplicated: Secondary | ICD-10-CM | POA: Diagnosis not present

## 2023-10-18 DIAGNOSIS — R7303 Prediabetes: Secondary | ICD-10-CM | POA: Diagnosis not present

## 2023-10-18 DIAGNOSIS — R062 Wheezing: Secondary | ICD-10-CM | POA: Diagnosis not present

## 2023-10-18 DIAGNOSIS — R058 Other specified cough: Secondary | ICD-10-CM

## 2023-10-18 DIAGNOSIS — Z6841 Body Mass Index (BMI) 40.0 and over, adult: Secondary | ICD-10-CM

## 2023-10-18 MED ORDER — AZITHROMYCIN 250 MG PO TABS
ORAL_TABLET | ORAL | 1 refills | Status: AC
  Filled 2023-10-18: qty 6, 5d supply, fill #0

## 2023-10-18 MED ORDER — METHYLPREDNISOLONE 4 MG PO TBPK
ORAL_TABLET | ORAL | 0 refills | Status: DC
  Filled 2023-10-18: qty 21, 9d supply, fill #0

## 2023-10-18 MED ORDER — ALBUTEROL SULFATE HFA 108 (90 BASE) MCG/ACT IN AERS
2.0000 | INHALATION_SPRAY | Freq: Four times a day (QID) | RESPIRATORY_TRACT | 0 refills | Status: DC | PRN
  Filled 2023-10-18: qty 6.7, 25d supply, fill #0

## 2023-10-18 MED ORDER — HYDROCODONE BIT-HOMATROP MBR 5-1.5 MG/5ML PO SOLN
5.0000 mL | Freq: Four times a day (QID) | ORAL | 0 refills | Status: AC | PRN
  Filled 2023-10-18: qty 180, 9d supply, fill #0

## 2023-10-18 MED ORDER — PHENTERMINE HCL 30 MG PO CAPS
30.0000 mg | ORAL_CAPSULE | ORAL | 1 refills | Status: DC
  Filled 2023-10-18: qty 30, 30d supply, fill #0
  Filled 2023-11-15: qty 30, 30d supply, fill #1
  Filled 2023-12-04 – 2023-12-14 (×2): qty 30, 30d supply, fill #2
  Filled 2024-01-21 (×2): qty 30, 30d supply, fill #3

## 2023-10-18 NOTE — Telephone Encounter (Signed)
 FYI Only or Action Required?: FYI only for provider.  Patient was last seen in primary care on 05/17/2023 by Norleen Lynwood ORN, MD.  Called Nurse Triage reporting Sinusitis and Shortness of Breath.  Symptoms began 2 days ago.  Interventions attempted: OTC medications: cold/flu daytime medicine.  Symptoms are: unchanged.  Triage Disposition: See Physician Within 24 Hours  Patient/caregiver understands and will follow disposition?: Yes                             Copied from CRM [id number]. Topic: Clinical - Red Word Triage >> Oct 18, 2023  8:22 AM [name] wrote: Reason for RMF:enddpaoz sinus infection,trouble breathing, aches, pains and coughing at night Reason for Disposition  Earache  Answer Assessment - Initial Assessment Questions 1. LOCATION: Where does it hurt?      Nose, eyes and forehead 2. ONSET: When did the sinus pain start?  (e.g., hours, days)      2 days ago 3. SEVERITY: How bad is the pain?   (Scale 0-10; or none, mild, moderate or severe)     Rates pain a 7 5. NASAL CONGESTION: Is the nose blocked? If Yes, ask: Can you open it or must you breathe through your mouth?     Yes, states she feels SOB because she is having to take quick/short breaths through her mouth, patient able to speak in clear and complete sentences while on phone with this RN 6. NASAL DISCHARGE: Do you have discharge from your nose? If so ask, What color?     Yellow 7. FEVER: Do you have a fever? If Yes, ask: What is it, how was it measured, and when did it start?      Denies fever 8. OTHER SYMPTOMS: Do you have any other symptoms? (e.g., sore throat, cough, earache, difficulty breathing)     Dry cough that worsens at night, mild right earache, dry throat, body aches and pains, mild puffiness around eyes, denies redness  Protocols used: Sinus Pain or Congestion-A-AH

## 2023-10-18 NOTE — Assessment & Plan Note (Signed)
Mild to mod, for prednisone taper, albuterol hfa prn,  to f/u any worsening symptoms or concerns

## 2023-10-18 NOTE — Assessment & Plan Note (Signed)
 Lab Results  Component Value Date   HGBA1C 5.5 05/17/2023   Stable, pt to continue current medical treatment  - diet,wt control

## 2023-10-18 NOTE — Assessment & Plan Note (Signed)
 Pt with unaffordable for GLP1, ok for phentermine  30 mg every day trial

## 2023-10-18 NOTE — Progress Notes (Signed)
 Patient ID: [name], female   DOB: 27-Jan-1980, 42 y.o.   MRN: [id number]        Chief Complaint: follow up prod cough, wheezing, preDM, anxiett, obesity       HPI:  [name] is a 43 y.o. female Here with acute onset mild to mod 4 days ST, HA, general weakness and malaise, with prod cough greenish sputum and 2 days worsening wheezing sob doe, but Pt denies chest pain, orthopnea, PND, increased LE swelling, palpitations, dizziness or syncope.   Pt denies polydipsia, polyuria, or new focal neuro s/s.    Pt denies wt loss, night sweats, loss of appetite, or other constitutional symptoms  Pt still smoking not ready to quit. Conts to gain wt, unable to control diet and activity.  GLP1 not affordable.  Denies worsening depressive symptoms, suicidal ideation, or panic; has ongoing anxiety, mild increased with illness  Wt Readings from Last 3 Encounters:  10/18/23 (!) 337 lb 6.4 oz (153 kg)  05/17/23 (!) 336 lb (152.4 kg)  10/22/22 (!) 318 lb 9.6 oz (144.5 kg)   BP Readings from Last 3 Encounters:  10/18/23 120/62  05/17/23 126/82  10/22/22 134/75         Past Medical History:  Diagnosis Date   ADD 10/22/2009   ALLERGIC RHINITIS 09/26/2008   Anemia    ANXIETY 09/26/2008   Asthma    Complication of anesthesia    tempermental per patient, aggiated and hostile   CONSTIPATION 09/26/2008   DEPRESSION 09/26/2008   FATIGUE 01/14/2009   Head injury with loss of consciousness (HCC)    Headache    HYPERLIPIDEMIA 09/26/2008   IBS (irritable bowel syndrome)    OTITIS MEDIA, LEFT 01/14/2009   SKIN LESION 09/26/2008   Sleep apnea    TONSILLITIS, ACUTE 01/14/2009   Unconscious state (HCC) 10/13/2017   URI 10/22/2009   Past Surgical History:  Procedure Laterality Date   CERVICAL POLYPECTOMY  2009   CERVICAL POLYPECTOMY  2019   DILATATION & CURETTAGE/HYSTEROSCOPY WITH MYOSURE N/A 11/17/2017   Procedure: DILATATION & CURETTAGE/HYSTEROSCOPY WITH MYOSURE;  Surgeon: Timmie Norris, MD;  Location:  [facility];  Service: Gynecology;  Laterality: N/A;   ORIF ANKLE FRACTURE Right 08/24/2018   Procedure: OPEN REDUCTION INTERNAL FIXATION TRIMALLEOLAR ANKLE FRACTURE;  Surgeon: Barbarann Oneil BROCKS, MD;  Location: [facility];  Service: Orthopedics;  Laterality: Right;    reports that she has been smoking cigarettes. She has a 20 pack-year smoking history. She has never used smokeless tobacco. She reports current alcohol use. She reports that she does not use drugs. family history includes Clotting disorder in her maternal grandmother; Diabetes in her maternal aunt; Hypertension in her mother; Irritable bowel syndrome in her mother; Thyroid  disease in her mother. No Known Allergies Current Outpatient Medications on File Prior to Visit  Medication Sig Dispense Refill   acetaminophen  (TYLENOL ) 325 MG tablet Take 650 mg by mouth every 6 (six) hours as needed.     buPROPion  (WELLBUTRIN  XL) 300 MG 24 hr tablet Take 1 tablet (300 mg total) by mouth every morning. 30 tablet 3   Drospirenone  (SLYND ) 4 MG TABS Take 1 tablet (4 mg total) by mouth daily. 84 tablet 4   escitalopram  (LEXAPRO ) 20 MG tablet Take 1 tablet (20 mg total) by mouth daily. 30 tablet 3   hydrOXYzine  (ATARAX ) 25 MG tablet Take 1 tablet (25 mg total) by mouth 3 (three) times daily as needed for anxiety. 90 tablet 3   hyoscyamine  (OSCIMIN )  0.125 MG tablet Take 1 tablet (0.125 mg total) by mouth every 4 (four) hours as needed for cramping. 30 tablet 1   loperamide  (IMODIUM  A-D) 2 MG tablet Take 1 tablet (2 mg total) by mouth 4 (four) times daily as needed for diarrhea or loose stools. 24 tablet 0   nicotine  polacrilex (NICORETTE ) 2 MG gum Take 1 each (2 mg total) by mouth as needed for smoking cessation. 100 tablet 3   norethindrone  (MICRONOR ) 0.35 MG tablet Take 1 tablet (0.35 mg total) by mouth daily. 84 tablet 4   OLANZapine  (ZYPREXA ) 20 MG tablet Take 1 tablet (20 mg total) by mouth at bedtime. 30 tablet 3   ondansetron  (ZOFRAN -ODT) 4 MG disintegrating  tablet Take 1 tablet (4 mg total) by mouth every 8 (eight) hours as needed. 20 tablet 0   traZODone  (DESYREL ) 50 MG tablet Take 1 tablet (50 mg total) by mouth at bedtime as needed for sleep. 30 tablet 3   No current facility-administered medications on file prior to visit.        ROS:  All others reviewed and negative.  Objective        PE:  BP 120/62   Pulse (!) 103   Temp 98.2 F (36.8 C)   Ht 5' 9.25 (1.759 m)   Wt (!) 337 lb 6.4 oz (153 kg)   SpO2 95%   BMI 49.47 kg/m                 Constitutional: Pt appears mild ill               HENT: Head: NCAT.                Right Ear: External ear normal.                 Left Ear: External ear normal. Bilat tm's with mild erythema.  Max sinus areas non tender.  Pharynx with mild erythema, no exudate               Eyes: . Pupils are equal, round, and reactive to light. Conjunctivae and EOM are normal               Nose: without d/c or deformity               Neck: Neck supple. Gross normal ROM               Cardiovascular: Normal rate and regular rhythm.                 Pulmonary/Chest: Effort normal and breath sounds decreased without rales but with few bilateral wheezing.                Neurological: Pt is alert. At baseline orientation, motor grossly intact               Skin: Skin is warm. No rashes, no other new lesions, LE edema - none               Psychiatric: Pt behavior is normal without agitation , mild nervous  Micro: none  Cardiac tracings I have personally interpreted today:  none  Pertinent Radiological findings (summarize): none   Lab Results  Component Value Date   WBC 10.9 (H) 05/17/2023   HGB 14.3 05/17/2023   HCT 43.4 05/17/2023   PLT 307.0 05/17/2023   GLUCOSE 97 05/17/2023   CHOL 224 (H) 05/17/2023   TRIG 186.0 (H) 05/17/2023  HDL 43.20 05/17/2023   LDLCALC 143 (H) 05/17/2023   ALT 21 05/17/2023   AST 16 05/17/2023   NA 137 05/17/2023   K 3.9 05/17/2023   CL 102 05/17/2023   CREATININE 0.73  05/17/2023   BUN 7 05/17/2023   CO2 27 05/17/2023   TSH 2.02 05/17/2023   HGBA1C 5.5 05/17/2023   MICROALBUR 1.5 05/17/2023   POCT- covid - neg,  flu neg  Assessment/Plan:  [name] is a 43 y.o. White or Caucasian [1] female with  has a past medical history of ADD (10/22/2009), ALLERGIC RHINITIS (09/26/2008), Anemia, ANXIETY (09/26/2008), Asthma, Complication of anesthesia, CONSTIPATION (09/26/2008), DEPRESSION (09/26/2008), FATIGUE (01/14/2009), Head injury with loss of consciousness (HCC), Headache, HYPERLIPIDEMIA (09/26/2008), IBS (irritable bowel syndrome), OTITIS MEDIA, LEFT (01/14/2009), SKIN LESION (09/26/2008), Sleep apnea, TONSILLITIS, ACUTE (01/14/2009), Unconscious state (HCC) (10/13/2017), and URI (10/22/2009).  Wheezing Mild to mod, for prednisone  taper, albuterol  hfa prn,  to f/u any worsening symptoms or concerns  Productive cough Mild to mod, c/w bronchitis vs pna, declines cxr, now for antibx course zpack, and cough med prn,  to f/u any worsening symptoms or concerns  Prediabetes Lab Results  Component Value Date   HGBA1C 5.5 05/17/2023   Stable, pt to continue current medical treatment  - diet,wt control   Obesity, morbid (HCC) Pt with unaffordable for GLP1, ok for phentermine  30 mg every day trial  Tobacco dependence Pt counsled to quit, pt not ready  Followup: Return if symptoms worsen or fail to improve.  Lynwood Rush, MD 10/18/2023 6:56 PM [facility]

## 2023-10-18 NOTE — Patient Instructions (Signed)
 Your COVID and Flu testing is negative  Please take all new medication as prescribed - the antibiotic, cough medicine, medrol  and inhaler as needed  Please take all new medication as prescribed  - the phentermine  for wt loss  Please continue all other medications as before, and refills have been done if requested.  Please have the pharmacy call with any other refills you may need.  Please keep your appointments with your specialists as you may have planned

## 2023-10-18 NOTE — Assessment & Plan Note (Signed)
 Pt counsled to quit, pt not ready

## 2023-10-18 NOTE — Assessment & Plan Note (Signed)
 Mild to mod, c/w bronchitis vs pna, declines cxr, now for antibx course zpack, and cough med prn,  to f/u any worsening symptoms or concerns

## 2023-10-19 ENCOUNTER — Other Ambulatory Visit: Payer: Self-pay

## 2023-11-02 ENCOUNTER — Other Ambulatory Visit: Payer: Self-pay

## 2023-11-02 ENCOUNTER — Other Ambulatory Visit (HOSPITAL_COMMUNITY): Payer: Self-pay

## 2023-11-15 ENCOUNTER — Other Ambulatory Visit: Payer: Self-pay

## 2023-11-16 ENCOUNTER — Other Ambulatory Visit: Payer: Self-pay

## 2023-12-01 ENCOUNTER — Ambulatory Visit: Payer: MEDICAID | Admitting: Internal Medicine

## 2023-12-04 ENCOUNTER — Other Ambulatory Visit (HOSPITAL_COMMUNITY): Payer: Self-pay | Admitting: Psychiatry

## 2023-12-04 DIAGNOSIS — F411 Generalized anxiety disorder: Secondary | ICD-10-CM

## 2023-12-04 DIAGNOSIS — F319 Bipolar disorder, unspecified: Secondary | ICD-10-CM

## 2023-12-06 ENCOUNTER — Other Ambulatory Visit: Payer: Self-pay

## 2023-12-06 MED ORDER — OLANZAPINE 20 MG PO TABS
20.0000 mg | ORAL_TABLET | Freq: Every day | ORAL | 3 refills | Status: DC
  Filled 2023-12-06: qty 30, 30d supply, fill #0

## 2023-12-06 MED ORDER — ESCITALOPRAM OXALATE 20 MG PO TABS
20.0000 mg | ORAL_TABLET | Freq: Every day | ORAL | 3 refills | Status: DC
  Filled 2023-12-06: qty 30, 30d supply, fill #0

## 2023-12-09 ENCOUNTER — Other Ambulatory Visit: Payer: Self-pay

## 2023-12-09 ENCOUNTER — Encounter (HOSPITAL_COMMUNITY): Payer: Self-pay | Admitting: Psychiatry

## 2023-12-09 ENCOUNTER — Telehealth (INDEPENDENT_AMBULATORY_CARE_PROVIDER_SITE_OTHER): Payer: MEDICAID | Admitting: Psychiatry

## 2023-12-09 DIAGNOSIS — F411 Generalized anxiety disorder: Secondary | ICD-10-CM | POA: Diagnosis not present

## 2023-12-09 DIAGNOSIS — F319 Bipolar disorder, unspecified: Secondary | ICD-10-CM

## 2023-12-09 DIAGNOSIS — F17200 Nicotine dependence, unspecified, uncomplicated: Secondary | ICD-10-CM

## 2023-12-09 DIAGNOSIS — F900 Attention-deficit hyperactivity disorder, predominantly inattentive type: Secondary | ICD-10-CM

## 2023-12-09 MED ORDER — BUPROPION HCL ER (XL) 300 MG PO TB24
300.0000 mg | ORAL_TABLET | ORAL | 3 refills | Status: DC
  Filled 2024-02-07: qty 30, 30d supply, fill #0

## 2023-12-09 MED ORDER — OLANZAPINE 20 MG PO TABS
20.0000 mg | ORAL_TABLET | Freq: Every day | ORAL | 3 refills | Status: DC
  Filled 2024-01-06: qty 30, 30d supply, fill #0
  Filled 2024-02-07: qty 30, 30d supply, fill #1

## 2023-12-09 MED ORDER — TRAZODONE HCL 50 MG PO TABS
50.0000 mg | ORAL_TABLET | Freq: Every evening | ORAL | 3 refills | Status: DC | PRN
  Filled 2024-02-07: qty 30, 30d supply, fill #0

## 2023-12-09 MED ORDER — ESCITALOPRAM OXALATE 20 MG PO TABS
20.0000 mg | ORAL_TABLET | Freq: Every day | ORAL | 3 refills | Status: DC
  Filled 2024-01-06: qty 30, 30d supply, fill #0
  Filled 2024-02-07: qty 30, 30d supply, fill #1

## 2023-12-09 MED ORDER — HYDROXYZINE HCL 25 MG PO TABS
25.0000 mg | ORAL_TABLET | Freq: Three times a day (TID) | ORAL | 3 refills | Status: DC | PRN
  Filled 2024-02-07: qty 90, 30d supply, fill #0

## 2023-12-09 MED ORDER — NICOTINE POLACRILEX 2 MG MT GUM
2.0000 mg | CHEWING_GUM | OROMUCOSAL | 3 refills | Status: DC | PRN
  Filled 2023-12-09 – 2024-01-24 (×2): qty 100, 30d supply, fill #0

## 2023-12-09 MED ORDER — NORETHINDRONE 0.35 MG PO TABS: 1.0000 | ORAL_TABLET | Freq: Every day | ORAL | 0 refills | Status: AC

## 2023-12-09 NOTE — Progress Notes (Addendum)
 [facility] MD/PA/NP OP Progress Note Virtual Visit via Video Note  I connected with [name] on 12/09/23 at 11:00 AM EST by a video enabled telemedicine application and verified that I am speaking with the correct person using two identifiers.  Location: Patient: Work Provider: Clinic   I discussed the limitations of evaluation and management by telemedicine and the availability of in person appointments. The patient expressed understanding and agreed to proceed.  I provided 30 minutes of non-face-to-face time during this encounter.     12/09/2023 11:19 AM [name]  MRN:  [id number]  Chief Complaint: I have been doing good    HPI: 43 year old female seen today for follow up psychiatric evaluation.    She has psychiatric history of depression, ADD, bipolar 1 disorder, and anxiety. She is currently managed on Lexapro  20 mg daily, hydroxyzine  25 mg three time daily, Zyprexa  20mg  nightly, NicoDerm 2 mg gum, Wellbutrin  XL 300 mg mg daily,  and trazodone  50mg  nightly as needed. She reports that her medications are effective in managing her psychiatric conditions.   Today she was well-groomed, pleasant, cooperative, and engaged in conversation.  She informed clinical research associate that she has been doing well.  She informed her mood is stable and notes that she has minimal anxiety and depression.  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  Today provider conducted a GAD-7 and patient scored a 6.  Provider also conducted a PHQ-9 the patient scored an 10.Today she denies SI/HI/VAH, mania, paranoia.  She endorses adequate sleep and appetite.  Patient reports that she has been taking phentermine  and has lost 10 pounds.  No medication changes made today.  Patient agreeable to continue medication as prescribed. No other concerns at this time.     Visit Diagnosis:    ICD-10-CM   1. Tobacco dependence  F17.200 nicotine  polacrilex (NICORETTE ) 2 MG gum    buPROPion  (WELLBUTRIN  XL) 300 MG 24 hr tablet    2.  Bipolar 1 disorder (HCC)  F31.9 escitalopram  (LEXAPRO ) 20 MG tablet    OLANZapine  (ZYPREXA ) 20 MG tablet    traZODone  (DESYREL ) 50 MG tablet    3. Generalized anxiety disorder  F41.1 escitalopram  (LEXAPRO ) 20 MG tablet    OLANZapine  (ZYPREXA ) 20 MG tablet    hydrOXYzine  (ATARAX ) 25 MG tablet    4. Attention deficit hyperactivity disorder (ADHD), predominantly inattentive type  F90.0 buPROPion  (WELLBUTRIN  XL) 300 MG 24 hr tablet            Past Psychiatric History:  depression, ADD, bipolar 1 disorder, and anxiety  Past Medical History:  Past Medical History:  Diagnosis Date   ADD 10/22/2009   ALLERGIC RHINITIS 09/26/2008   Anemia    ANXIETY 09/26/2008   Asthma    Complication of anesthesia    tempermental per patient, aggiated and hostile   CONSTIPATION 09/26/2008   DEPRESSION 09/26/2008   FATIGUE 01/14/2009   Head injury with loss of consciousness (HCC)    Headache    HYPERLIPIDEMIA 09/26/2008   IBS (irritable bowel syndrome)    OTITIS MEDIA, LEFT 01/14/2009   SKIN LESION 09/26/2008   Sleep apnea    TONSILLITIS, ACUTE 01/14/2009   Unconscious state (HCC) 10/13/2017   URI 10/22/2009    Past Surgical History:  Procedure Laterality Date   CERVICAL POLYPECTOMY  2009   CERVICAL POLYPECTOMY  2019   DILATATION & CURETTAGE/HYSTEROSCOPY WITH MYOSURE N/A 11/17/2017   Procedure: DILATATION & CURETTAGE/HYSTEROSCOPY WITH MYOSURE;  Surgeon: Timmie Norris, MD;  Location: [facility];  Service: Gynecology;  Laterality: N/A;   ORIF ANKLE FRACTURE Right 08/24/2018   Procedure: OPEN REDUCTION INTERNAL FIXATION TRIMALLEOLAR ANKLE FRACTURE;  Surgeon: Barbarann Oneil BROCKS, MD;  Location: [facility];  Service: Orthopedics;  Laterality: Right;    Family Psychiatric History: Brother depression and mother anxiety  Family History:  Family History  Problem Relation Age of Onset   Thyroid  disease Mother    Hypertension Mother    Irritable bowel syndrome Mother    Clotting disorder Maternal Grandmother     Diabetes Maternal Aunt    Colon cancer Neg Hx    Esophageal cancer Neg Hx     Social History:  Social History   Socioeconomic History   Marital status: Single    Spouse name: Not on file   Number of children: 0   Years of education: Not on file   Highest education level: Not on file  Occupational History   Occupation: full time waitress  Tobacco Use   Smoking status: Every Day    Current packs/day: 1.00    Average packs/day: 1 pack/day for 20.0 years (20.0 ttl pk-yrs)    Types: Cigarettes   Smokeless tobacco: Never  Vaping Use   Vaping status: Some Days  Substance and Sexual Activity   Alcohol use: Yes    Comment: occ   Drug use: No   Sexual activity: Not Currently    Birth control/protection: Abstinence  Other Topics Concern   Not on file  Social History Narrative   Lives with mother   Social Drivers of Health   Financial Resource Strain: Not on file  Food Insecurity: Not on file  Transportation Needs: Not on file  Physical Activity: Not on file  Stress: Not on file  Social Connections: Not on file    Allergies: No Known Allergies  Metabolic Disorder Labs: Lab Results  Component Value Date   HGBA1C 5.5 05/17/2023   MPG 105 06/22/2020   MPG 108 07/25/2019   No results found for: PROLACTIN Lab Results  Component Value Date   CHOL 224 (H) 05/17/2023   TRIG 186.0 (H) 05/17/2023   HDL 43.20 05/17/2023   CHOLHDL 5 05/17/2023   VLDL 37.2 05/17/2023   LDLCALC 143 (H) 05/17/2023   LDLCALC 80 06/22/2020   Lab Results  Component Value Date   TSH 2.02 05/17/2023   TSH 3.23 08/25/2022    Therapeutic Level Labs: No results found for: LITHIUM No results found for: VALPROATE Lab Results  Component Value Date   CBMZ 6.8 07/29/2019    Current Medications: Current Outpatient Medications  Medication Sig Dispense Refill   acetaminophen  (TYLENOL ) 325 MG tablet Take 650 mg by mouth every 6 (six) hours as needed.     albuterol  (VENTOLIN  HFA) 108 (90  Base) MCG/ACT inhaler Inhale 2 puffs into the lungs every 6 (six) hours as needed for wheezing or shortness of breath. 6.7 g 0   buPROPion  (WELLBUTRIN  XL) 300 MG 24 hr tablet Take 1 tablet (300 mg total) by mouth every morning. 30 tablet 3   Drospirenone  (SLYND ) 4 MG TABS Take 1 tablet (4 mg total) by mouth daily. 84 tablet 4   escitalopram  (LEXAPRO ) 20 MG tablet Take 1 tablet (20 mg total) by mouth daily. 30 tablet 3   hydrOXYzine  (ATARAX ) 25 MG tablet Take 1 tablet (25 mg total) by mouth 3 (three) times daily as needed for anxiety. 90 tablet 3   hyoscyamine  (OSCIMIN ) 0.125 MG tablet Take 1 tablet (0.125 mg total) by mouth every 4 (  four) hours as needed for cramping. 30 tablet 1   loperamide  (IMODIUM  A-D) 2 MG tablet Take 1 tablet (2 mg total) by mouth 4 (four) times daily as needed for diarrhea or loose stools. 24 tablet 0   methylPREDNISolone  (MEDROL  DOSEPAK) 4 MG TBPK tablet Take 4 tab by mouth x 3 days, 2 tab x 3 days, 1 tab x 3 days 21 tablet 0   nicotine  polacrilex (NICORETTE ) 2 MG gum Take 1 each (2 mg total) by mouth as needed for smoking cessation. 100 tablet 3   norethindrone  (MICRONOR ) 0.35 MG tablet Take 1 tablet (0.35 mg total) by mouth daily. 84 tablet 4   OLANZapine  (ZYPREXA ) 20 MG tablet Take 1 tablet (20 mg total) by mouth at bedtime. 30 tablet 3   ondansetron  (ZOFRAN -ODT) 4 MG disintegrating tablet Take 1 tablet (4 mg total) by mouth every 8 (eight) hours as needed. 20 tablet 0   phentermine  30 MG capsule Take 1 capsule (30 mg total) by mouth every morning. 90 capsule 1   traZODone  (DESYREL ) 50 MG tablet Take 1 tablet (50 mg total) by mouth at bedtime as needed for sleep. 30 tablet 3   No current facility-administered medications for this visit.     Musculoskeletal: Strength & Muscle Tone: within normal limits and telehealth visit Gait & Station: normal, telehealth visit Patient leans: N/A  Psychiatric Specialty Exam: Review of Systems  There were no vitals taken for this  visit.There is no height or weight on file to calculate BMI.  General Appearance: Well Groomed  Eye Contact:  Good  Speech:  Clear and Coherent and Normal Rate  Volume:  Normal  Mood:  Euthymic  Affect:  Appropriate and Congruent  Thought Process:  Coherent, Goal Directed, and Linear  Orientation:  Full (Time, Place, and Person)  Thought Content: WDL and Logical   Suicidal Thoughts:  No  Homicidal Thoughts:  No  Memory:  Immediate;   Good Recent;   Good Remote;   Good  Judgement:  Good  Insight:  Good  Psychomotor Activity:  Normal  Concentration:  Concentration: Good and Attention Span: Good  Recall:  Good  Fund of Knowledge: Good  Language: Good  Akathisia:  No  Handed:  Right  AIMS (if indicated): not done  Assets:  Communication Skills Desire for Improvement Financial Resources/Insurance Housing Intimacy Physical Health Social Support  ADL's:  Intact  Cognition: WNL  Sleep:  Good   Screenings: AIMS    Flowsheet Row [facility] Ad[facility] (Dis[facility] Ad[facility] ([facility]) [facility]  AIMS Total Score 0 0 0 0   AUDIT    Flowsheet Row Admission (Discharged) from [facility] Ad[facility] (Dis[facility]) [facility] Ad[facility] (Discharged) [facility] Ad[facility] ([facility]) [facility]  Alcohol Use Disorder Identification Test Final Score (AUDIT) 0 0 4 0   GAD-7    Flowsheet Row Video Visit from 12/09/2023 in [facility] Video Visit from 06/17/2023 in  [facility] Video Visit from 03/24/2023 in [facility] Video Visit from 01/14/2023 in [facility] Video Visit from 11/04/2022 in  [facility]  Total GAD-7 Score 6 4 5 6 10    PHQ2-9    Flowsheet Row Video Visit from 12/09/2023 in [facility] Video Visit from 06/17/2023 in [facility] Visit from 05[facility]17/2023 in [facility] Video Visit from 03/24/2023 in [facility] Video Visit from 01/14/2023 in [facility]  PHQ-2 Total Score 2 2 0 2 4  PHQ-9 Total Score 10 12 -- 14 11   Flowsheet Row Clinical Support from 09/04/2020 in [facility] Counselor from 07/24/2020 in [facility] Admission (Discharged) from [facility]  C-SSRS RISK CATEGORY No Risk No Risk Moderate Risk     Assessment and Plan: Patient reports that her depression and anxiety are well managed. No medication changes made today.  Patient agreeable to continue medication as prescribed.   1. Tobacco dependence  Continue- nicotine  polacrilex (NICORETTE ) 2 MG gum; Take 1 each (2 mg total) by mouth as needed for smoking cessation.  Dispense: 100 tablet; Refill: 3 Continue- buPROPion  (WELLBUTRIN  XL) 300 MG 24 hr tablet; Take 1 tablet (300 mg total) by mouth every morning.  Dispense: 30 tablet; Refill: 3  2. Bipolar 1 disorder (HCC)  Continue- escitalopram  (LEXAPRO ) 20 MG tablet; Take 1 tablet (20 mg total) by mouth daily.  Dispense: 30 tablet; Refill: 3 - OLANZapine  (ZYPREXA ) 20 MG tablet; Take 1 tablet (20 mg total) by mouth at bedtime.  Dispense: 30 tablet; Refill: 3 Continue- traZODone  (DESYREL ) 50 MG tablet; Take 1 tablet (50 mg total) by mouth at bedtime as  needed for sleep.  Dispense: 30 tablet; Refill: 3  3. Generalized anxiety disorder  Continue- escitalopram  (LEXAPRO ) 20 MG tablet; Take 1 tablet (20 mg total) by mouth daily.  Dispense: 30 tablet; Refill: 3 Continue- OLANZapine  (ZYPREXA ) 20 MG tablet; Take 1 tablet (20 mg total) by mouth at bedtime.  Dispense: 30 tablet; Refill: 3 Continue- hydrOXYzine  (ATARAX ) 25 MG tablet; Take 1 tablet (25 mg total) by mouth 3 (three) times daily as needed for anxiety.  Dispense: 90 tablet; Refill: 3  4. Attention deficit hyperactivity disorder (ADHD), predominantly inattentive type  Continue- buPROPion  (WELLBUTRIN  XL) 300 MG 24 hr tablet; Take 1 tablet (300 mg total) by mouth every morning.  Dispense: 30 tablet; Refill: 3   Follow up in 2.5 months Follow up with therapy  Zane FORBES Bach, NP 12/09/2023, 11:19 AM

## 2023-12-14 ENCOUNTER — Other Ambulatory Visit: Payer: Self-pay

## 2023-12-15 ENCOUNTER — Other Ambulatory Visit: Payer: Self-pay

## 2023-12-16 ENCOUNTER — Telehealth: Payer: Self-pay

## 2023-12-16 NOTE — Telephone Encounter (Signed)
 Called and let Pt know she may come by the office & the front desk will print out a list of her medications.

## 2023-12-16 NOTE — Telephone Encounter (Signed)
 Copied from CRM [id number]. Topic: General - Other >> Dec 16, 2023  3:58 PM Franky GRADE wrote: Reason for CRM: Patient is calling to a list of her active medication list, she would like to come and pick it up. Please notify the patient when she can pick it up.

## 2024-01-06 ENCOUNTER — Other Ambulatory Visit: Payer: Self-pay

## 2024-01-09 ENCOUNTER — Other Ambulatory Visit: Payer: Self-pay

## 2024-01-09 ENCOUNTER — Emergency Department (HOSPITAL_COMMUNITY): Payer: MEDICAID

## 2024-01-09 ENCOUNTER — Emergency Department (HOSPITAL_COMMUNITY)
Admission: EM | Admit: 2024-01-09 | Discharge: 2024-01-09 | Disposition: A | Discharge status: Home / Self Care | Payer: MEDICAID | Attending: Emergency Medicine | Admitting: Emergency Medicine

## 2024-01-09 DIAGNOSIS — S8992XA Unspecified injury of left lower leg, initial encounter: Secondary | ICD-10-CM | POA: Insufficient documentation

## 2024-01-09 DIAGNOSIS — Z79899 Other long term (current) drug therapy: Secondary | ICD-10-CM | POA: Insufficient documentation

## 2024-01-09 DIAGNOSIS — W010XXA Fall on same level from slipping, tripping and stumbling without subsequent striking against object, initial encounter: Secondary | ICD-10-CM | POA: Insufficient documentation

## 2024-01-09 DIAGNOSIS — M25562 Pain in left knee: Secondary | ICD-10-CM

## 2024-01-09 DIAGNOSIS — W19XXXA Unspecified fall, initial encounter: Secondary | ICD-10-CM

## 2024-01-09 MED ORDER — OXYCODONE-ACETAMINOPHEN 5-325 MG PO TABS
1.0000 | ORAL_TABLET | Freq: Once | ORAL | Status: AC
  Administered 2024-01-09: 1 via ORAL
  Filled 2024-01-09: qty 1

## 2024-01-09 MED ORDER — OXYCODONE-ACETAMINOPHEN 5-325 MG PO TABS: 1.0000 | ORAL_TABLET | Freq: Four times a day (QID) | ORAL | 0 refills | Status: AC | PRN

## 2024-01-09 MED ORDER — FENTANYL CITRATE (PF) 50 MCG/ML IJ SOSY
50.0000 ug | PREFILLED_SYRINGE | Freq: Once | INTRAMUSCULAR | Status: AC
  Administered 2024-01-09: 50 ug via INTRAVENOUS
  Filled 2024-01-09: qty 1

## 2024-01-09 NOTE — ED Provider Notes (Signed)
 [place] EMERGENCY DEPARTMENT AT [facility] Provider Note   CSN: [id number] Arrival date & time: 01/09/24  1735     Patient presents with: Knee Pain   [name] is a 43 y.o. female.  With history of hyperlipidemia, depression, generalized anxiety disorder, tobacco dependence, bipolar, TBI, and prediabetes.   Patient is here for evaluation of left knee pain after mechanical fall.  Patient states that she tripped over a package and fell forward mostly to her left knee.  She has been unable to to bear weight on this knee without excruciating pain since that time.  She has been using a cane to help her ambulate since the fall.  She does not usually use a cane.  She states if she bears weight on the left lower extremity it feels like something is going to pop within her knee.  Denies use of blood thinners.  Denies head injury or LOC.  Denies any other injuries.  The history is provided by the patient.  Knee Pain Location:  Knee      Prior to Admission medications  Medication Sig Start Date End Date Taking? Authorizing Provider  acetaminophen  (TYLENOL ) 325 MG tablet Take 650 mg by mouth every 6 (six) hours as needed.    [provider]  albuterol  (VENTOLIN  HFA) 108 (90 Base) MCG/ACT inhaler Inhale 2 puffs into the lungs every 6 (six) hours as needed for wheezing or shortness of breath. 10/18/23   Norleen Lynwood ORN, MD  buPROPion  (WELLBUTRIN  XL) 300 MG 24 hr tablet Take 1 tablet (300 mg total) by mouth every morning. 12/09/23   Harl Zane BRAVO, NP  Drospirenone  (SLYND ) 4 MG TABS Take 1 tablet (4 mg total) by mouth daily. 10/22/22   Dawson, Rolitta, CNM  escitalopram  (LEXAPRO ) 20 MG tablet Take 1 tablet (20 mg total) by mouth daily. 12/09/23   Harl Zane BRAVO, NP  hydrOXYzine  (ATARAX ) 25 MG tablet Take 1 tablet (25 mg total) by mouth 3 (three) times daily as needed for anxiety. 12/09/23   Harl Zane BRAVO, NP  hyoscyamine  (OSCIMIN ) 0.125 MG tablet Take 1  tablet (0.125 mg total) by mouth every 4 (four) hours as needed for cramping. 04/06/22   Craig Alan SAUNDERS, PA-C  loperamide  (IMODIUM  A-D) 2 MG tablet Take 1 tablet (2 mg total) by mouth 4 (four) times daily as needed for diarrhea or loose stools. 02/12/23   Vivienne Delon HERO, PA-C  methylPREDNISolone  (MEDROL  DOSEPAK) 4 MG TBPK tablet Take 4 tab by mouth x 3 days, 2 tab x 3 days, 1 tab x 3 days 10/18/23   Norleen Lynwood ORN, MD  nicotine  polacrilex (NICORETTE ) 2 MG gum Take 1 each (2 mg total) by mouth as needed for smoking cessation. 12/09/23   Harl Zane BRAVO, NP  norethindrone  (MICRONOR ) 0.35 MG tablet Take 1 tablet (0.35 mg total) by mouth daily. 07/20/23     norethindrone  (MICRONOR ) 0.35 MG tablet Take 1 tablet (0.35 mg total) by mouth daily. 12/09/23     OLANZapine  (ZYPREXA ) 20 MG tablet Take 1 tablet (20 mg total) by mouth at bedtime. 12/09/23   Harl Zane BRAVO, NP  ondansetron  (ZOFRAN -ODT) 4 MG disintegrating tablet Take 1 tablet (4 mg total) by mouth every 8 (eight) hours as needed. 02/12/23   Vivienne Delon HERO, PA-C  phentermine  30 MG capsule Take 1 capsule (30 mg total) by mouth every morning. 10/18/23   Norleen Lynwood ORN, MD  traZODone  (DESYREL ) 50 MG tablet Take 1 tablet (50 mg total)  by mouth at bedtime as needed for sleep. 12/09/23   Harl Zane BRAVO, NP    Allergies: Patient has no known allergies.    Review of Systems  Musculoskeletal:        Left knee pain    Updated Vital Signs BP (!) 210/143 (BP Location: Right Arm)   Pulse (!) 108   Temp 98.1 F (36.7 C) (Oral)   Resp 16   LMP  (LMP Unknown) Comment: Oral contraceptives  SpO2 97%   Physical Exam Vitals and nursing note reviewed.  Constitutional:      General: She is not in acute distress.    Appearance: Normal appearance. She is not ill-appearing, toxic-appearing or diaphoretic.  HENT:     Head: Normocephalic and atraumatic.     Nose: Nose normal.  Eyes:     General: No scleral icterus.    Extraocular  Movements: Extraocular movements intact.     Conjunctiva/sclera: Conjunctivae normal.  Pulmonary:     Effort: Pulmonary effort is normal. No respiratory distress.  Musculoskeletal:        General: Swelling, tenderness and signs of injury present. Normal range of motion.     Cervical back: Normal range of motion.     Comments: Left knee pain with palpation, movement, and weightbearing.  Left knee appears slightly swollen and no obvious deformities noted though exam is limited due to patient body habitus.  Position of comfort to the left knee is slightly bent. Left side dorsal pedal pulses intact as well as sensory and motor.  Skin:    General: Skin is warm and dry.     Coloration: Skin is not jaundiced or pale.  Neurological:     Mental Status: She is alert and oriented to person, place, and time.     (all labs ordered are listed, but only abnormal results are displayed) Labs Reviewed - No data to display  EKG: None  Radiology: CT Knee Left Wo Contrast Result Date: 01/09/2024 EXAM: CT LEFT KNEE, WITHOUT IV CONTRAST 01/09/2024 07:10:51 PM TECHNIQUE: Axial images were acquired through the left knee without IV contrast. Reformatted images were reviewed. Automated exposure control, iterative reconstruction, and/or weight based adjustment of the mA/kV was utilized to reduce the radiation dose to as low as reasonably achievable. COMPARISON: X-ray from earlier in the same day. CLINICAL HISTORY: Knee trauma, internal derangement suspected, x-ray done. Recent follow-up with abnormal plain film. FINDINGS: BONES: Fracture involving the lateral tibial plateau which extends vertically into the lateral metaphysis without significant displacement or impaction at the fracture site. Fracture line extends towards the intercondylar eminence. No other fracture is identified. JOINTS: Small joint effusion is seen. No dislocation. The cruciate ligaments appear intact SOFT TISSUES: The soft tissues are  unremarkable. IMPRESSION: 1. Fracture involving the lateral tibial plateau extending vertically into the lateral metaphysis and toward the intercondylar eminence, without significant displacement or impaction. 2. Small joint effusion. Electronically signed by: Oneil Devonshire MD 01/09/2024 07:17 PM EST RP Workstation: [id number]   DG Knee Complete 4 Views Left Result Date: 01/09/2024 CLINICAL DATA:  Left knee pain following fall. EXAM: LEFT KNEE - COMPLETE 4+ VIEW COMPARISON:  None Available. FINDINGS: Linear lucency is noted in the lateral tibial plateau. The remaining bony structures appear intact. There is no dislocation. There is a suspected small suprapatellar joint effusion. The soft tissues are otherwise within normal limits. IMPRESSION: 1. Lucency in the lateral tibial plateau on multiple images, possible artifact versus nondisplaced fracture. CT is suggested for further evaluation. 2.  Suspected small joint effusion. Electronically Signed   By: Leita Birmingham M.D.   On: 01/09/2024 18:27    Procedures   Medications Ordered in the ED  fentaNYL  (SUBLIMAZE ) injection 50 mcg (has no administration in time range)     Patient presents to the ED for concern of left knee pain secondary to fall, this involves an extensive number of treatment options, and is a complaint that carries with it a high risk of complications and morbidity.  The differential diagnosis includes fracture, dislocation, soft tissue injury, meniscal tear/injury.   Co morbidities that complicate the patient evaluation  Morbid obesity   Imaging Studies ordered:  I ordered imaging studies including left knee x-ray and CT. Imaging which showed fracture involving the lateral tibial plateau without displacement or impaction.  As well as a small right effusion.   Medicines ordered and prescription drug management:  I ordered medication including IV fentanyl  and p.o. Percocet for pain management. Reevaluation of the patient after  these medicines showed that the patient improved.  Initial marked hypertension and tachycardia improved with pain management.   Consultations Obtained:  I requested consultation with orthopedics Alphonse MD),  and discussed lab and imaging findings as well as pertinent plan - they recommend: Placement of knee immobilizer with crutches and recommendation for no weightbearing on the left lower extremity.  They will see her in their office at 8:30 AM tomorrow for ongoing management.   Problem List / ED Course:  Clinical Course as of 01/09/24 2111  [place] Jan 09, 2024  2110 Discharge pending placement of knee immobilizer and crutches. [EA]    Clinical Course User Index [EA] Rosina Norris A, PA-C    Left knee fracture.  Imaging performed and pain management given.  Orthopedics consulted and their recommendation was followed for knee immobilizer and crutches.  Patient will follow-up with them in the outpatient setting for ongoing management.   Reevaluation:  After the interventions noted above, I reevaluated the patient and found that they have :improved   Dispostion:  After consideration of the diagnostic results and the patients response to treatment, I feel that the patent would benefit from supportive care in the home setting with knee immobilizer, crutches, and short course pain medication with recommendation for nonweightbearing on the left lower extremity and close follow-up with orthopedics for ongoing management.  Return precautions given.  Medical Decision Making Amount and/or Complexity of Data Reviewed Radiology: ordered.   This note was produced using Electronics Engineer. While the provider has reviewed and verified all clinical information, transcription errors may remain.    Final diagnoses:  None    ED Discharge Orders     None          Rosina Norris DELENA DEVONNA 01/09/24 2119    Garrick Charleston, MD 01/09/24 2232

## 2024-01-09 NOTE — Discharge Instructions (Addendum)
 As we does test there was a fracture involving the lateral tibial plateau of your left knee found on imaging today.  I have sent a short course of pain medication to your pharmacy.  You have an appointment scheduled with outpatient orthopedics with Dr. Beverley tomorrow at 8:30 AM.  Information for this office is included in this paperwork.  Please return for further evaluation if pain worsens, you have additional falls, or any other concerning symptoms develop.  Please use Tylenol  or ibuprofen  for pain.  You may use 600 mg ibuprofen  every 6 hours or 1000 mg of Tylenol  every 6 hours.  You may choose to alternate between the 2.  This would be most effective.  Not to exceed 4 g of Tylenol  within 24 hours.  Not to exceed 3200 mg ibuprofen  24 hours.

## 2024-01-09 NOTE — ED Triage Notes (Signed)
 Pt arrives to triage via wheelchair, with complaints of LEFT knee pain. PT states that she stepped on a package that was just outside her door, which made her fall down onto her LEFT knee.

## 2024-01-09 NOTE — ED Notes (Signed)
 Patient back from CT

## 2024-01-10 ENCOUNTER — Other Ambulatory Visit (HOSPITAL_COMMUNITY): Payer: Self-pay

## 2024-01-10 ENCOUNTER — Other Ambulatory Visit: Payer: Self-pay

## 2024-01-10 DIAGNOSIS — S82142A Displaced bicondylar fracture of left tibia, initial encounter for closed fracture: Secondary | ICD-10-CM | POA: Insufficient documentation

## 2024-01-10 MED ORDER — HYDROCODONE-ACETAMINOPHEN 10-325 MG PO TABS
1.0000 | ORAL_TABLET | Freq: Three times a day (TID) | ORAL | 0 refills | Status: AC | PRN
  Filled 2024-01-10: qty 21, 7d supply, fill #0

## 2024-01-10 NOTE — H&P (Signed)
 PREOPERATIVE H&P  Chief Complaint: LEFT TIBIA PLATEAU FRACTURE  HPI: [name] is a 43 y.o. female who presents with a diagnosis of LEFT TIBIA PLATEAU FRACTURE. Symptoms are rated as moderate to severe, and have been worsening.  This is significantly impairing activities of daily living.  She has elected for surgical management.   Past Medical History:  Diagnosis Date   ADD 10/22/2009   ALLERGIC RHINITIS 09/26/2008   Anemia    ANXIETY 09/26/2008   Asthma    Complication of anesthesia    tempermental per patient, aggiated and hostile   CONSTIPATION 09/26/2008   DEPRESSION 09/26/2008   FATIGUE 01/14/2009   Head injury with loss of consciousness (HCC)    Headache    HYPERLIPIDEMIA 09/26/2008   IBS (irritable bowel syndrome)    OTITIS MEDIA, LEFT 01/14/2009   SKIN LESION 09/26/2008   Sleep apnea    TONSILLITIS, ACUTE 01/14/2009   Unconscious state (HCC) 10/13/2017   URI 10/22/2009   Past Surgical History:  Procedure Laterality Date   CERVICAL POLYPECTOMY  2009   CERVICAL POLYPECTOMY  2019   DILATATION & CURETTAGE/HYSTEROSCOPY WITH MYOSURE N/A 11/17/2017   Procedure: DILATATION & CURETTAGE/HYSTEROSCOPY WITH MYOSURE;  Surgeon: Timmie Norris, MD;  Location: [facility];  Service: Gynecology;  Laterality: N/A;   ORIF ANKLE FRACTURE Right 08/24/2018   Procedure: OPEN REDUCTION INTERNAL FIXATION TRIMALLEOLAR ANKLE FRACTURE;  Surgeon: Barbarann Oneil BROCKS, MD;  Location: [facility];  Service: Orthopedics;  Laterality: Right;   Social History   Socioeconomic History   Marital status: Single    Spouse name: Not on file   Number of children: 0   Years of education: Not on file   Highest education level: Not on file  Occupational History   Occupation: full time waitress  Tobacco Use   Smoking status: Every Day    Current packs/day: 1.00    Average packs/day: 1 pack/day for 20.0 years (20.0 ttl pk-yrs)    Types: Cigarettes   Smokeless tobacco: Never  Vaping Use   Vaping status: Some Days   Substance and Sexual Activity   Alcohol use: Yes    Comment: occ   Drug use: No   Sexual activity: Not Currently    Birth control/protection: Abstinence  Other Topics Concern   Not on file  Social History Narrative   Lives with mother   Social Drivers of Health   Tobacco Use: High Risk (12/09/2023)   Patient History    Smoking Tobacco Use: Every Day    Smokeless Tobacco Use: Never    Passive Exposure: Not on file  Financial Resource Strain: Not on file  Food Insecurity: Not on file  Transportation Needs: Not on file  Physical Activity: Not on file  Stress: Not on file  Social Connections: Not on file  Depression (PHQ2-9): Medium Risk (12/09/2023)   Depression (PHQ2-9)    PHQ-2 Score: 10  Alcohol Screen: Not on file  Housing: Not on file  Utilities: Not on file  Health Literacy: Not on file   Family History  Problem Relation Age of Onset   Thyroid  disease Mother    Hypertension Mother    Irritable bowel syndrome Mother    Clotting disorder Maternal Grandmother    Diabetes Maternal Aunt    Colon cancer Neg Hx    Esophageal cancer Neg Hx    Allergies[1] Prior to Admission medications  Medication Sig Start Date End Date Taking? Authorizing Provider  acetaminophen  (TYLENOL ) 325 MG tablet Take 650 mg by mouth  every 6 (six) hours as needed.    [provider]  albuterol  (VENTOLIN  HFA) 108 (90 Base) MCG/ACT inhaler Inhale 2 puffs into the lungs every 6 (six) hours as needed for wheezing or shortness of breath. 10/18/23   Norleen Lynwood ORN, MD  buPROPion  (WELLBUTRIN  XL) 300 MG 24 hr tablet Take 1 tablet (300 mg total) by mouth every morning. 12/09/23   Harl Zane BRAVO, NP  Drospirenone  (SLYND ) 4 MG TABS Take 1 tablet (4 mg total) by mouth daily. 10/22/22   Dawson, Rolitta, CNM  escitalopram  (LEXAPRO ) 20 MG tablet Take 1 tablet (20 mg total) by mouth daily. 12/09/23   Harl Zane BRAVO, NP  hydrOXYzine  (ATARAX ) 25 MG tablet Take 1 tablet (25 mg total) by mouth 3  (three) times daily as needed for anxiety. 12/09/23   Harl Zane BRAVO, NP  hyoscyamine  (OSCIMIN ) 0.125 MG tablet Take 1 tablet (0.125 mg total) by mouth every 4 (four) hours as needed for cramping. 04/06/22   Craig Alan SAUNDERS, PA-C  loperamide  (IMODIUM  A-D) 2 MG tablet Take 1 tablet (2 mg total) by mouth 4 (four) times daily as needed for diarrhea or loose stools. 02/12/23   Vivienne Delon HERO, PA-C  methylPREDNISolone  (MEDROL  DOSEPAK) 4 MG TBPK tablet Take 4 tab by mouth x 3 days, 2 tab x 3 days, 1 tab x 3 days 10/18/23   Norleen Lynwood ORN, MD  nicotine  polacrilex (NICORETTE ) 2 MG gum Take 1 each (2 mg total) by mouth as needed for smoking cessation. 12/09/23   Harl Zane BRAVO, NP  norethindrone  (MICRONOR ) 0.35 MG tablet Take 1 tablet (0.35 mg total) by mouth daily. 07/20/23     norethindrone  (MICRONOR ) 0.35 MG tablet Take 1 tablet (0.35 mg total) by mouth daily. 12/09/23     OLANZapine  (ZYPREXA ) 20 MG tablet Take 1 tablet (20 mg total) by mouth at bedtime. 12/09/23   Harl Zane BRAVO, NP  ondansetron  (ZOFRAN -ODT) 4 MG disintegrating tablet Take 1 tablet (4 mg total) by mouth every 8 (eight) hours as needed. 02/12/23   Vivienne Delon HERO, PA-C  oxyCODONE -acetaminophen  (PERCOCET/ROXICET) 5-325 MG tablet Take 1 tablet by mouth every 6 (six) hours as needed for severe pain (pain score 7-10). 01/09/24   Rosina Almarie LABOR, PA-C  phentermine  30 MG capsule Take 1 capsule (30 mg total) by mouth every morning. 10/18/23   Norleen Lynwood ORN, MD  traZODone  (DESYREL ) 50 MG tablet Take 1 tablet (50 mg total) by mouth at bedtime as needed for sleep. 12/09/23   Harl Zane BRAVO, NP     Positive ROS: All other systems have been reviewed and were otherwise negative with the exception of those mentioned in the HPI and as above.  Physical Exam: General: Alert, no acute distress Cardiovascular: No pedal edema Respiratory: No cyanosis, no use of accessory musculature GI: No organomegaly, abdomen is soft and  non-tender Skin: No lesions in the area of chief complaint Neurologic: Sensation intact distally Psychiatric: Patient is competent for consent with normal mood and affect Lymphatic: No axillary or cervical lymphadenopathy  MUSCULOSKELETAL: ***   Imaging:   Assessment: LEFT TIBIA PLATEAU FRACTURE  Plan: Plan for Procedures: OPEN REDUCTION INTERNAL FIXATION (ORIF) TIBIAL PLATEAU REPAIR, MENISCUS, KNEE  The risks benefits and alternatives were discussed with the patient including but not limited to the risks of nonoperative treatment, versus surgical intervention including infection, bleeding, nerve injury,  blood clots, cardiopulmonary complications, morbidity, mortality, among others, and they were willing to proceed.   Weightbearing: Orthopedic devices: Showering:  Dressing: Medicines:  Discharge: Follow up:    Axxel Gude M Serenitee Fuertes, PA-C Office [phone] 01/10/2024 5:03 PM       [1] No Known Allergies

## 2024-01-11 ENCOUNTER — Other Ambulatory Visit: Payer: Self-pay

## 2024-01-11 ENCOUNTER — Encounter (HOSPITAL_BASED_OUTPATIENT_CLINIC_OR_DEPARTMENT_OTHER): Payer: Self-pay | Admitting: Orthopedic Surgery

## 2024-01-11 NOTE — Progress Notes (Signed)
°   01/11/24 1603  PAT Phone Screen  Is the patient taking a GLP-1 receptor agonist? No  Do You Have Diabetes? No  Do You Have Hypertension? No  Have You Ever Been to the ER for Asthma? No  Have You Taken Oral Steroids in the Past 3 Months? No  Do you Take Phenteramine or any Other Diet Drugs? No  Recent  Lab Work, EKG, CXR? No  Do you have a history of heart problems? No  Any Recent Hospitalizations? No  Height 5' 9 (1.753 m)  Weight (!) 163.3 kg  [employer] (S)  Yes (ht/ wt , anesthesia consult)

## 2024-01-12 ENCOUNTER — Encounter (HOSPITAL_BASED_OUTPATIENT_CLINIC_OR_DEPARTMENT_OTHER)
Admission: RE | Admit: 2024-01-12 | Discharge: 2024-01-12 | Disposition: A | Discharge status: Home / Self Care | Payer: MEDICAID | Source: Ambulatory Visit | Attending: Orthopedic Surgery | Admitting: Orthopedic Surgery

## 2024-01-12 NOTE — Progress Notes (Signed)
 Ms. Kiesel is scheduled to have a tibial plateau ORIF on 01/18/24 at [facility] and she was scheduled for an anesthesia consult due to BMI >50. On review of her chart, she has a current BMI 54.73. Her comorbidities include current cigarette smoking, asthma requiring albuterol  inhaler, and OSA for which she does not use a CPAP. I met the patient in our preoperative area where she was seated in a wheelchair after having just been assisted to stand for weight check. She is pursed lip breathing but otherwise appears in no acute distress. She states that her breathing does not feel labored or any different than usual. Due to her morbid obesity and comorbidities, I do not feel that she is a safe candidate for her surgery to be performed in our outpatient surgical center and advised her and her mother who accompanied her that I would recommend she be rescheduled at our main hospital. The patient and her mother were in agreement with this plan. I spoke to her surgeon via telephone and informed him of my concerns. He plans to reschedule the case at [facility].   LOIS Almarie Row, MD, MPH Anesthesiology

## 2024-01-17 ENCOUNTER — Encounter (HOSPITAL_COMMUNITY): Payer: Self-pay | Admitting: Orthopedic Surgery

## 2024-01-17 ENCOUNTER — Other Ambulatory Visit: Payer: Self-pay

## 2024-01-17 NOTE — Pre-Procedure Instructions (Signed)
 SDW CALL  Patient was given pre-op instructions over the phone. The opportunity was given for the patient to ask questions. No further questions asked. Patient verbalized understanding of instructions given.   PCP - Norleen Lynwood ORN, MD  Cardiologist - denies  PPM/ICD - denies   Chest x-ray -  EKG - NA Stress Test - denies ECHO - denies Cardiac Cath - denies  Sleep Study - has not been diagnosed by MD   Fasting Blood Sugar - n/a  Blood Thinner Instructions: n/a Aspirin  Instructions:n/a  ERAS Protcol - clears until 9:45 AM  COVID TEST-  N/A   Anesthesia review: yes- see note from Dr. Paul  Patient denies shortness of breath, fever, cough and chest pain over the phone call    Surgical Instructions    Your procedure is scheduled on January 18, 2024  Report to [facility] Main Entrance A at 10:15 A.M., then check in with the Admitting office.  Call this number if you have problems the morning of surgery:  [phone]    Remember:  Do not eat after midnight the night before your surgery  You may drink clear liquids until 9:45AM the morning of your surgery.   Clear liquids allowed are: Water , Non-Citrus Juices (without pulp), Carbonated Beverages, Clear Tea, Black Coffee ONLY (NO MILK, no honey, CREAM OR POWDERED CREAMER of any kind), and Gatorade   Take these medicines the morning of surgery with A SIP OF WATER :  buPROPion  (WELLBUTRIN  XL)    If needed:  acetaminophen  (TYLENOL ) OR HYDROcodone -acetaminophen  (NORCO)   albuterol  (VENTOLIN  HFA) 108 (90 Base) MCG/ACT inhaler -bring to hospital  hydrOXYzine  (ATARAX )    As of today, STOP taking any Aspirin  (unless otherwise instructed by your surgeon) Aleve , Naproxen , Ibuprofen , Motrin , Advil , Goody's, BC's, all herbal medications, fish oil, and all vitamins.  [facility] is not responsible for any belongings or valuables.   Do NOT Smoke (Tobacco/Vaping)  24 hours prior to your procedure  Contacts, glasses,  hearing aids, dentures or partials may not be worn into surgery, please bring cases for these belongings   Patients discharged the day of surgery will not be allowed to drive home, and someone needs to stay with them for 24 hours.   SURGICAL WAITING ROOM VISITATION You may have 1 visitor in the pre-op area at a time determined by the pre-op nurse. (Visitor may not switch out) Patients having surgery or a procedure in a hospital may have two support people in the waiting room. Children under the age of 46 must have an adult with them who is not the patient.     Day of Surgery:  Take a shower the day of or night before with antibacterial soap. Wear Clean/Comfortable clothing the morning of surgery Do not apply any deodorants/lotions.   Do not wear jewelry or makeup Do not wear lotions, powders, perfumes/colognes, or deodorant. Do not shave 48 hours prior to surgery.   Do not bring valuables to the hospital. Do not wear nail polish, gel polish, artificial nails, or any other type of covering on natural nails (fingers and toes)  Remember to brush your teeth WITH YOUR REGULAR TOOTHPASTE.

## 2024-01-18 ENCOUNTER — Ambulatory Visit (HOSPITAL_BASED_OUTPATIENT_CLINIC_OR_DEPARTMENT_OTHER): Payer: MEDICAID | Admitting: Anesthesiology

## 2024-01-18 ENCOUNTER — Encounter (HOSPITAL_COMMUNITY): Payer: Self-pay | Admitting: Orthopedic Surgery

## 2024-01-18 ENCOUNTER — Encounter (HOSPITAL_COMMUNITY): Payer: Self-pay | Admitting: Anesthesiology

## 2024-01-18 ENCOUNTER — Ambulatory Visit (HOSPITAL_COMMUNITY): Payer: MEDICAID

## 2024-01-18 ENCOUNTER — Encounter: Admission: RE | Disposition: A | Payer: Self-pay | Attending: Orthopedic Surgery

## 2024-01-18 ENCOUNTER — Ambulatory Visit (HOSPITAL_COMMUNITY)
Admission: RE | Admit: 2024-01-18 | Discharge: 2024-01-18 | Disposition: A | Discharge status: Home / Self Care | Payer: MEDICAID | Attending: Orthopedic Surgery | Admitting: Orthopedic Surgery

## 2024-01-18 DIAGNOSIS — S82142A Displaced bicondylar fracture of left tibia, initial encounter for closed fracture: Secondary | ICD-10-CM

## 2024-01-18 DIAGNOSIS — F17210 Nicotine dependence, cigarettes, uncomplicated: Secondary | ICD-10-CM | POA: Diagnosis not present

## 2024-01-18 DIAGNOSIS — X58XXXA Exposure to other specified factors, initial encounter: Secondary | ICD-10-CM | POA: Insufficient documentation

## 2024-01-18 DIAGNOSIS — F418 Other specified anxiety disorders: Secondary | ICD-10-CM | POA: Diagnosis not present

## 2024-01-18 DIAGNOSIS — J449 Chronic obstructive pulmonary disease, unspecified: Secondary | ICD-10-CM

## 2024-01-18 HISTORY — PX: MENISCUS REPAIR: SHX5179

## 2024-01-18 HISTORY — PX: ORIF TIBIA PLATEAU: SHX2132

## 2024-01-18 LAB — CBC
HCT: 46.5 % — ABNORMAL HIGH (ref 36.0–46.0)
Hemoglobin: 14.5 g/dL (ref 12.0–15.0)
MCH: 29.7 pg (ref 26.0–34.0)
MCHC: 31.2 g/dL (ref 30.0–36.0)
MCV: 95.3 fL (ref 80.0–100.0)
Platelets: 323 K/uL (ref 150–400)
RBC: 4.88 MIL/uL (ref 3.87–5.11)
RDW: 13.9 % (ref 11.5–15.5)
WBC: 11.1 K/uL — ABNORMAL HIGH (ref 4.0–10.5)
nRBC: 0 % (ref 0.0–0.2)

## 2024-01-18 LAB — POCT PREGNANCY, URINE: Preg Test, Ur: NEGATIVE

## 2024-01-18 SURGERY — OPEN REDUCTION INTERNAL FIXATION (ORIF) TIBIAL PLATEAU
Anesthesia: General | Site: Knee | Laterality: Left

## 2024-01-18 MED ORDER — FENTANYL CITRATE (PF) 100 MCG/2ML IJ SOLN
INTRAMUSCULAR | Status: AC
  Filled 2024-01-18: qty 2

## 2024-01-18 MED ORDER — OXYCODONE HCL 5 MG PO TABS
ORAL_TABLET | ORAL | Status: AC
  Filled 2024-01-18: qty 1

## 2024-01-18 MED ORDER — ROCURONIUM BROMIDE 100 MG/10ML IV SOLN
INTRAVENOUS | Status: DC | PRN
  Administered 2024-01-18: 70 mg via INTRAVENOUS
  Administered 2024-01-18: 20 mg via INTRAVENOUS
  Administered 2024-01-18: 10 mg via INTRAVENOUS

## 2024-01-18 MED ORDER — CHLORHEXIDINE GLUCONATE 0.12 % MT SOLN: 15.0000 mL | Freq: Once | OROMUCOSAL | Status: AC

## 2024-01-18 MED ORDER — MIDAZOLAM HCL 2 MG/2ML IJ SOLN
INTRAMUSCULAR | Status: AC
  Administered 2024-01-18: 2 mg via INTRAVENOUS
  Filled 2024-01-18: qty 2

## 2024-01-18 MED ORDER — MIDAZOLAM HCL (PF) 2 MG/2ML IJ SOLN: 2.0000 mg | Freq: Once | INTRAMUSCULAR | Status: AC

## 2024-01-18 MED ORDER — FENTANYL CITRATE (PF) 100 MCG/2ML IJ SOLN: 100.0000 ug | Freq: Once | INTRAMUSCULAR | Status: AC

## 2024-01-18 MED ORDER — 0.9 % SODIUM CHLORIDE (POUR BTL) OPTIME
TOPICAL | Status: DC | PRN
  Administered 2024-01-18: 1000 mL

## 2024-01-18 MED ORDER — PROPOFOL 10 MG/ML IV BOLUS
INTRAVENOUS | Status: DC | PRN
  Administered 2024-01-18: 200 mg via INTRAVENOUS

## 2024-01-18 MED ORDER — NALOXONE HCL 0.4 MG/ML IJ SOLN
INTRAMUSCULAR | Status: AC
  Filled 2024-01-18: qty 1

## 2024-01-18 MED ORDER — MIDAZOLAM HCL (PF) 2 MG/2ML IJ SOLN: 0.5000 mg | Freq: Once | INTRAMUSCULAR | Status: DC | PRN

## 2024-01-18 MED ORDER — ONDANSETRON HCL 4 MG/2ML IJ SOLN
INTRAMUSCULAR | Status: DC | PRN
  Administered 2024-01-18: 4 mg via INTRAVENOUS

## 2024-01-18 MED ORDER — PHENYLEPHRINE 80 MCG/ML (10ML) SYRINGE FOR IV PUSH (FOR BLOOD PRESSURE SUPPORT)
PREFILLED_SYRINGE | INTRAVENOUS | Status: AC
  Filled 2024-01-18: qty 20

## 2024-01-18 MED ORDER — ACETAMINOPHEN 500 MG PO TABS
1000.0000 mg | ORAL_TABLET | Freq: Once | ORAL | Status: AC
  Administered 2024-01-18: 1000 mg via ORAL
  Filled 2024-01-18: qty 2

## 2024-01-18 MED ORDER — VANCOMYCIN HCL 1000 MG IV SOLR
INTRAVENOUS | Status: DC | PRN
  Administered 2024-01-18: 1000 mg via TOPICAL

## 2024-01-18 MED ORDER — POVIDONE-IODINE 10 % EX SWAB
2.0000 | Freq: Once | CUTANEOUS | Status: AC
  Administered 2024-01-18: 2 via TOPICAL

## 2024-01-18 MED ORDER — SUGAMMADEX SODIUM 200 MG/2ML IV SOLN
INTRAVENOUS | Status: DC | PRN
  Administered 2024-01-18: 400 mg via INTRAVENOUS

## 2024-01-18 MED ORDER — HYDROMORPHONE HCL 1 MG/ML IJ SOLN
0.2500 mg | INTRAMUSCULAR | Status: DC | PRN
  Administered 2024-01-18 (×2): 0.5 mg via INTRAVENOUS

## 2024-01-18 MED ORDER — LACTATED RINGERS IV SOLN: INTRAVENOUS | Status: DC

## 2024-01-18 MED ORDER — VANCOMYCIN HCL 1000 MG IV SOLR
INTRAVENOUS | Status: AC
  Filled 2024-01-18: qty 20

## 2024-01-18 MED ORDER — CHLORHEXIDINE GLUCONATE 0.12 % MT SOLN
OROMUCOSAL | Status: AC
  Administered 2024-01-18: 15 mL via OROMUCOSAL
  Filled 2024-01-18: qty 15

## 2024-01-18 MED ORDER — PHENYLEPHRINE 80 MCG/ML (10ML) SYRINGE FOR IV PUSH (FOR BLOOD PRESSURE SUPPORT)
PREFILLED_SYRINGE | INTRAVENOUS | Status: AC
  Filled 2024-01-18: qty 10

## 2024-01-18 MED ORDER — SUGAMMADEX SODIUM 200 MG/2ML IV SOLN
INTRAVENOUS | Status: AC
  Filled 2024-01-18: qty 4

## 2024-01-18 MED ORDER — FENTANYL CITRATE (PF) 100 MCG/2ML IJ SOLN
INTRAMUSCULAR | Status: AC
  Administered 2024-01-18: 100 ug via INTRAVENOUS
  Filled 2024-01-18: qty 2

## 2024-01-18 MED ORDER — TRANEXAMIC ACID-NACL 1000-0.7 MG/100ML-% IV SOLN
1000.0000 mg | INTRAVENOUS | Status: DC
  Filled 2024-01-18: qty 100

## 2024-01-18 MED ORDER — MEPERIDINE HCL 25 MG/ML IJ SOLN: 6.2500 mg | INTRAMUSCULAR | Status: DC | PRN

## 2024-01-18 MED ORDER — OXYCODONE HCL 5 MG PO TABS
5.0000 mg | ORAL_TABLET | Freq: Once | ORAL | Status: AC | PRN
  Administered 2024-01-18: 5 mg via ORAL

## 2024-01-18 MED ORDER — OXYCODONE HCL 5 MG PO TABS: 5.0000 mg | ORAL_TABLET | Freq: Four times a day (QID) | ORAL | 0 refills | Status: AC | PRN

## 2024-01-18 MED ORDER — ACETAMINOPHEN 500 MG PO TABS: 1000.0000 mg | ORAL_TABLET | Freq: Four times a day (QID) | ORAL | 0 refills | Status: AC | PRN

## 2024-01-18 MED ORDER — SENNA-DOCUSATE SODIUM 8.6-50 MG PO TABS: 2.0000 | ORAL_TABLET | Freq: Every day | ORAL | 1 refills | Status: AC | PRN

## 2024-01-18 MED ORDER — BACLOFEN 10 MG PO TABS: 10.0000 mg | ORAL_TABLET | Freq: Three times a day (TID) | ORAL | 0 refills | Status: AC | PRN

## 2024-01-18 MED ORDER — LIDOCAINE 2% (20 MG/ML) 5 ML SYRINGE
INTRAMUSCULAR | Status: AC
  Filled 2024-01-18: qty 10

## 2024-01-18 MED ORDER — LACTATED RINGERS IV SOLN: INTRAVENOUS | Status: DC | PRN

## 2024-01-18 MED ORDER — DEXAMETHASONE SOD PHOSPHATE PF 10 MG/ML IJ SOLN
INTRAMUSCULAR | Status: DC | PRN
  Administered 2024-01-18: 10 mg via INTRAVENOUS

## 2024-01-18 MED ORDER — HYDROMORPHONE HCL 1 MG/ML IJ SOLN
INTRAMUSCULAR | Status: AC
  Filled 2024-01-18: qty 1

## 2024-01-18 MED ORDER — ONDANSETRON HCL 4 MG/2ML IJ SOLN
INTRAMUSCULAR | Status: AC
  Filled 2024-01-18: qty 2

## 2024-01-18 MED ORDER — FENTANYL CITRATE (PF) 100 MCG/2ML IJ SOLN
INTRAMUSCULAR | Status: DC | PRN
  Administered 2024-01-18 (×2): 25 ug via INTRAVENOUS
  Administered 2024-01-18: 50 ug via INTRAVENOUS

## 2024-01-18 MED ORDER — LIDOCAINE 2% (20 MG/ML) 5 ML SYRINGE
INTRAMUSCULAR | Status: AC
  Filled 2024-01-18: qty 5

## 2024-01-18 MED ORDER — ASPIRIN 81 MG PO TBEC: 81.0000 mg | DELAYED_RELEASE_TABLET | Freq: Two times a day (BID) | ORAL | 0 refills | Status: AC

## 2024-01-18 MED ORDER — ONDANSETRON 4 MG PO TBDP: 4.0000 mg | ORAL_TABLET | Freq: Three times a day (TID) | ORAL | 0 refills | Status: AC | PRN

## 2024-01-18 MED ORDER — ORAL CARE MOUTH RINSE: 15.0000 mL | Freq: Once | OROMUCOSAL | Status: AC

## 2024-01-18 MED ORDER — PHENYLEPHRINE HCL (PRESSORS) 10 MG/ML IV SOLN
INTRAVENOUS | Status: DC | PRN
  Administered 2024-01-18 (×2): 80 ug via INTRAVENOUS

## 2024-01-18 MED ORDER — KETAMINE HCL 50 MG/5ML IJ SOSY
PREFILLED_SYRINGE | INTRAMUSCULAR | Status: AC
  Filled 2024-01-18: qty 5

## 2024-01-18 MED ORDER — BUPIVACAINE-EPINEPHRINE (PF) 0.5% -1:200000 IJ SOLN
INTRAMUSCULAR | Status: DC | PRN
  Administered 2024-01-18: 30 mL via PERINEURAL
  Administered 2024-01-18: 15 mL via PERINEURAL

## 2024-01-18 MED ORDER — OXYCODONE HCL 5 MG/5ML PO SOLN: 5.0000 mg | Freq: Once | ORAL | Status: AC | PRN

## 2024-01-18 MED ORDER — LIDOCAINE 2% (20 MG/ML) 5 ML SYRINGE
INTRAMUSCULAR | Status: DC | PRN
  Administered 2024-01-18: 100 mg via INTRAVENOUS

## 2024-01-18 MED ORDER — ROCURONIUM BROMIDE 10 MG/ML (PF) SYRINGE
PREFILLED_SYRINGE | INTRAVENOUS | Status: AC
  Filled 2024-01-18: qty 10

## 2024-01-18 MED ORDER — CEFAZOLIN SODIUM-DEXTROSE 3-4 GM/150ML-% IV SOLN
3.0000 g | INTRAVENOUS | Status: AC
  Administered 2024-01-18: 3 g via INTRAVENOUS
  Filled 2024-01-18: qty 150

## 2024-01-18 MED ORDER — MELOXICAM 15 MG PO TABS: 15.0000 mg | ORAL_TABLET | Freq: Every day | ORAL | 0 refills | Status: AC | PRN

## 2024-01-18 MED ORDER — MIDAZOLAM HCL 2 MG/2ML IJ SOLN
INTRAMUSCULAR | Status: AC
  Filled 2024-01-18: qty 2

## 2024-01-18 MED ORDER — TRANEXAMIC ACID-NACL 1000-0.7 MG/100ML-% IV SOLN
INTRAVENOUS | Status: DC | PRN
  Administered 2024-01-18: 1000 mg via INTRAVENOUS

## 2024-01-18 SURGICAL SUPPLY — 61 items
BAG COUNTER SPONGE SURGICOUNT (BAG) ×2 IMPLANT
BIT DRILL MIS AO 2.5X215 (BIT) IMPLANT
BLADE SURG 10 STRL SS (BLADE) ×2 IMPLANT
BLADE SURG 15 STRL LF DISP TIS (BLADE) ×2 IMPLANT
BNDG COHESIVE 4X5 TAN STRL LF (GAUZE/BANDAGES/DRESSINGS) ×2 IMPLANT
BNDG COMPR ESMARK 6X3 LF (GAUZE/BANDAGES/DRESSINGS) ×2 IMPLANT
BNDG ELASTIC 4INX 5YD STR LF (GAUZE/BANDAGES/DRESSINGS) IMPLANT
BNDG ELASTIC 6INX 5YD STR LF (GAUZE/BANDAGES/DRESSINGS) IMPLANT
BNDG ELASTIC 6X10 VLCR STRL LF (GAUZE/BANDAGES/DRESSINGS) IMPLANT
BNDG GAUZE DERMACEA FLUFF 4 (GAUZE/BANDAGES/DRESSINGS) ×2 IMPLANT
COVER SURGICAL LIGHT HANDLE (MISCELLANEOUS) ×2 IMPLANT
CUFF TRNQT CYL 34X4.125X (TOURNIQUET CUFF) IMPLANT
DRAPE C-ARM 42X72 X-RAY (DRAPES) ×2 IMPLANT
DRAPE C-ARMOR (DRAPES) IMPLANT
DRAPE IMP U-DRAPE 54X76 (DRAPES) ×4 IMPLANT
DRAPE INCISE IOBAN 66X45 STRL (DRAPES) ×2 IMPLANT
DRAPE U-SHAPE 47X51 STRL (DRAPES) ×2 IMPLANT
DRSG ADAPTIC 3X8 NADH LF (GAUZE/BANDAGES/DRESSINGS) IMPLANT
ELECTRODE REM PT RTRN 9FT ADLT (ELECTROSURGICAL) ×2 IMPLANT
GAUZE PAD ABD 8X10 STRL (GAUZE/BANDAGES/DRESSINGS) ×8 IMPLANT
GAUZE SPONGE 4X4 12PLY STRL (GAUZE/BANDAGES/DRESSINGS) ×2 IMPLANT
GLOVE BIO SURGEON STRL SZ7.5 (GLOVE) ×2 IMPLANT
GLOVE BIOGEL PI IND STRL 7.0 (GLOVE) ×2 IMPLANT
GLOVE BIOGEL PI IND STRL 8 (GLOVE) ×2 IMPLANT
GLOVE SURG SYN 6.5 PF PI (GLOVE) ×2 IMPLANT
GOWN STRL REUS W/ TWL LRG LVL3 (GOWN DISPOSABLE) ×4 IMPLANT
GOWN STRL REUS W/ TWL XL LVL3 (GOWN DISPOSABLE) ×4 IMPLANT
IMMOBILIZER KNEE 20 THIGH 36 (SOFTGOODS) IMPLANT
IMMOBILIZER KNEE 22 UNIV (SOFTGOODS) IMPLANT
KIT BASIN OR (CUSTOM PROCEDURE TRAY) ×2 IMPLANT
KIT TURNOVER KIT B (KITS) ×2 IMPLANT
KWIRE SMOOTH 2.0X150 (WIRE) IMPLANT
LEFT PROXIMAL TIBIA  PLATE (Plate) IMPLANT
MANIFOLD NEPTUNE II (INSTRUMENTS) ×2 IMPLANT
NDL SUT 6 .5 CRC .975X.05 MAYO (NEEDLE) IMPLANT
PACK ORTHO EXTREMITY (CUSTOM PROCEDURE TRAY) ×2 IMPLANT
PAD ARMBOARD POSITIONER FOAM (MISCELLANEOUS) ×4 IMPLANT
PADDING CAST COTTON 6X4 STRL (CAST SUPPLIES) IMPLANT
PLATE TIB PROX 3.5X95 2H LT (Plate) IMPLANT
SCREW BONE 40X3.5MM (Screw) IMPLANT
SCREW CANC 4.0X70MM (Screw) IMPLANT
SCREW CANCELLOUS 4.0MMX60MM (Screw) IMPLANT
SCREW CANCELLOUS 4.0X65MM (Screw) IMPLANT
SCREW CORTICAL 3.5MMX46MM (Screw) IMPLANT
SOLN 0.9% NACL POUR BTL 1000ML (IV SOLUTION) ×2 IMPLANT
SOLN STERILE WATER BTL 1000 ML (IV SOLUTION) ×4 IMPLANT
SPONGE T-LAP 18X18 ~~LOC~~+RFID (SPONGE) ×2 IMPLANT
STOCKINETTE IMPERVIOUS LG (DRAPES) ×2 IMPLANT
SUCTION TUBE FRAZIER 10FR DISP (SUCTIONS) ×2 IMPLANT
SUT ETHILON 3 0 PS 1 (SUTURE) IMPLANT
SUT MNCRL AB 4-0 PS2 18 (SUTURE) IMPLANT
SUT MON AB 2-0 CT1 36 (SUTURE) ×2 IMPLANT
SUT VIC AB 0 CT1 27XBRD ANBCTR (SUTURE) ×4 IMPLANT
SUT VIC AB 2-0 CT1 TAPERPNT 27 (SUTURE) IMPLANT
SUTURE FIBERWR #2 38 T-5 BLUE (SUTURE) IMPLANT
SUTURE FIBERWR 2-0 18 17.9 3/8 (SUTURE) ×2 IMPLANT
TOWEL GREEN STERILE (TOWEL DISPOSABLE) ×4 IMPLANT
TOWEL GREEN STERILE FF (TOWEL DISPOSABLE) ×2 IMPLANT
TOWEL OR 17X24 6PK STRL BLUE (TOWEL DISPOSABLE) ×2 IMPLANT
TUBE CONNECTING 12X1/4 (SUCTIONS) ×2 IMPLANT
YANKAUER SUCT BULB TIP NO VENT (SUCTIONS) ×2 IMPLANT

## 2024-01-18 NOTE — Anesthesia Postprocedure Evaluation (Signed)
"   Anesthesia Post Note  Patient: [name]  Procedure(s) Performed: OPEN REDUCTION INTERNAL FIXATION (ORIF) TIBIAL PLATEAU AND OPEN LATERAL MENISCUS REPAIR (Left) REPAIR, MENISCUS, KNEE (Left: Knee)     Patient location during evaluation: PACU Anesthesia Type: General Level of consciousness: awake and alert, patient cooperative and oriented Pain management: pain level controlled Vital Signs Assessment: post-procedure vital signs reviewed and stable Respiratory status: spontaneous breathing, nonlabored ventilation and respiratory function stable Cardiovascular status: blood pressure returned to baseline and stable Postop Assessment: no apparent nausea or vomiting and adequate PO intake Anesthetic complications: no   No notable events documented.  Last Vitals:  Vitals:   01/18/24 1530 01/18/24 1545  BP: (!) 136/90 (!) 131/91  Pulse: 98 98  Resp: 12 15  Temp:    SpO2: 94% 93%    Last Pain:  Vitals:   01/18/24 1527  TempSrc:   PainSc: 5                  Kasheem Toner,E. Parminder Cupples      "

## 2024-01-18 NOTE — Anesthesia Procedure Notes (Signed)
 Anesthesia Regional Block: Popliteal block   Pre-Anesthetic Checklist: , timeout performed,  Correct Patient, Correct Site, Correct Laterality,  Correct Procedure, Correct Position, site marked,  Risks and benefits discussed,  Surgical consent,  Pre-op evaluation,  At surgeon's request and post-op pain management  Laterality: Left and Lower  Prep: chloraprep       Needles:  Injection technique: Single-shot  Needle Type: Echogenic Needle     Needle Length: 9cm  Needle Gauge: 21     Additional Needles:   Procedures:,,,, ultrasound used (permanent image in chart),,    Narrative:  Start time: 01/18/2024 11:35 AM End time: 01/18/2024 11:38 AM Injection made incrementally with aspirations every 5 mL.  Performed by: Personally  Anesthesiologist: Leonce Athens, MD  Additional Notes: Pt identified in Holding room.  Monitors applied. Working IV access confirmed. Timeout, Sterile prep L distal lateral thigh/knee.  #21ga ECHOgenic Arrow block needle to sciatic nerve at split in popliteal fossa with US  guidance.  30cc 0.5% Bupivacaine  1:200 epi injected incrementally after negative test dose.  Patient asymptomatic, VSS, no heme aspirated, tolerated well.   JAYSON Leonce, MD

## 2024-01-18 NOTE — Anesthesia Preprocedure Evaluation (Addendum)
"                                    Anesthesia Evaluation  Patient identified by MRN, date of birth, ID band Patient awake    Reviewed: Allergy & Precautions, NPO status , Patient's Chart, lab work & pertinent test results  History of Anesthesia Complications Negative for: history of anesthetic complications  Airway Mallampati: I  TM Distance: >3 FB Neck ROM: Full    Dental  (+) Dental Advisory Given   Pulmonary asthma , COPD,  COPD inhaler, Current Smoker and Patient abstained from smoking.   breath sounds clear to auscultation       Cardiovascular (-) angina negative cardio ROS  Rhythm:Regular Rate:Normal     Neuro/Psych  Headaches  Anxiety Depression Bipolar Disorder      GI/Hepatic negative GI ROS, Neg liver ROS,,,  Endo/Other  BMI 54  Renal/GU negative Renal ROS     Musculoskeletal   Abdominal   Peds  Hematology Hb 14.5, plt 323k   Anesthesia Other Findings   Reproductive/Obstetrics OCPs                              Anesthesia Physical Anesthesia Plan  ASA: 3  Anesthesia Plan: General   Post-op Pain Management: Tylenol  PO (pre-op)* and Regional block*   Induction: Intravenous  PONV Risk Score and Plan: 2 and Ondansetron , Dexamethasone  and Treatment may vary due to age or medical condition  Airway Management Planned: Oral ETT  Additional Equipment: None  Intra-op Plan:   Post-operative Plan: Extubation in OR  Informed Consent: I have reviewed the patients History and Physical, chart, labs and discussed the procedure including the risks, benefits and alternatives for the proposed anesthesia with the patient or authorized representative who has indicated his/her understanding and acceptance.     Dental advisory given  Plan Discussed with: CRNA and Surgeon  Anesthesia Plan Comments: (Plan routine monitors, GA with popliteal and adductor canal blocks for post op analgesia)         Anesthesia Quick  Evaluation  "

## 2024-01-18 NOTE — Discharge Instructions (Addendum)

## 2024-01-18 NOTE — Interval H&P Note (Signed)
 History and Physical Interval Note:  01/18/2024 10:52 AM  [name]  has presented today for surgery, with the diagnosis of LEFT TIBIA PLATEAU FRACTURE.  The various methods of treatment have been discussed with the patient and family. After consideration of risks, benefits and other options for treatment, the patient has consented to  Procedures: OPEN REDUCTION INTERNAL FIXATION (ORIF) TIBIAL PLATEAU (Left) REPAIR, MENISCUS, KNEE (Left) as a surgical intervention.  The patient's history has been reviewed, patient examined, no change in status, stable for surgery.  I have reviewed the patient's chart and labs.  Questions were answered to the patient's satisfaction.     [name]

## 2024-01-18 NOTE — Anesthesia Procedure Notes (Signed)
 Anesthesia Regional Block: Adductor canal block   Pre-Anesthetic Checklist: , timeout performed,  Correct Patient, Correct Site, Correct Laterality,  Correct Procedure, Correct Position, site marked,  Risks and benefits discussed,  Surgical consent,  Pre-op evaluation,  At surgeon's request and post-op pain management  Laterality: Left and Lower  Prep: chloraprep       Needles:  Injection technique: Single-shot  Needle Type: Echogenic Needle     Needle Length: 9cm  Needle Gauge: 21     Additional Needles:   Procedures:,,,, ultrasound used (permanent image in chart),,    Narrative:  Start time: 01/18/2024 11:28 AM End time: 01/18/2024 11:34 AM Injection made incrementally with aspirations every 5 mL.  Performed by: Personally  Anesthesiologist: Leonce Athens, MD  Additional Notes: Pt identified in Holding room.  Monitors applied. Working IV access confirmed. Timeout, Sterile prep L thigh.  #21ga ECHOgenic Arrow block needle into adductor canal with US  guidance.  15cc 0.5% Bupivacaine  1:200k epi injected incrementally after negative test dose.  Patient asymptomatic, VSS, no heme aspirated, tolerated well.   [name] Leonce, MD

## 2024-01-18 NOTE — Anesthesia Procedure Notes (Signed)
 Procedure Name: Intubation Date/Time: 01/18/2024 1:34 PM  Performed by: Obadiah Reyes BROCKS, CRNAPre-anesthesia Checklist: Patient identified, Emergency Drugs available, Suction available and Patient being monitored Patient Re-evaluated:Patient Re-evaluated prior to induction Oxygen Delivery Method: Circle System Utilized Preoxygenation: Pre-oxygenation with 100% oxygen Induction Type: IV induction Ventilation: Mask ventilation without difficulty Laryngoscope Size: Glidescope and 3 Grade View: Grade II Tube type: Oral Tube size: 7.0 mm Number of attempts: 1 Airway Equipment and Method: Stylet and Oral airway Placement Confirmation: ETT inserted through vocal cords under direct vision, positive ETCO2 and breath sounds checked- equal and bilateral Secured at: 21 cm Tube secured with: Tape Dental Injury: Teeth and Oropharynx as per pre-operative assessment

## 2024-01-18 NOTE — Transfer of Care (Signed)
 Immediate Anesthesia Transfer of Care Note  Patient: [name]  Procedure(s) Performed: OPEN REDUCTION INTERNAL FIXATION (ORIF) TIBIAL PLATEAU AND OPEN LATERAL MENISCUS REPAIR (Left) REPAIR, MENISCUS, KNEE (Left: Knee)  Patient Location: PACU  Anesthesia Type:General and Regional  Level of Consciousness: awake, alert , and oriented  Airway & Oxygen Therapy: Patient Spontanous Breathing and Patient connected to face mask oxygen  Post-op Assessment: Report given to RN and Post -op Vital signs reviewed and stable  Post vital signs: Reviewed and stable  Last Vitals:  Vitals Value Taken Time  BP 108/94 01/18/24 14:50  Temp 36.4 C 01/18/24 14:50  Pulse 112 01/18/24 14:53  Resp 17 01/18/24 14:53  SpO2 91 % 01/18/24 14:53  Vitals shown include unfiled device data.  Last Pain:  Vitals:   01/18/24 0946  TempSrc:   PainSc: 8       Patients Stated Pain Goal: 0 (01/18/24 0946)  Complications: No notable events documented.

## 2024-01-19 ENCOUNTER — Encounter (HOSPITAL_COMMUNITY): Payer: Self-pay | Admitting: Orthopedic Surgery

## 2024-01-19 NOTE — Op Note (Addendum)
 01/18/2024  8:11 AM  PATIENT:  [name]    PRE-OPERATIVE DIAGNOSIS:  LEFT TIBIA PLATEAU FRACTURE  POST-OPERATIVE DIAGNOSIS:  Same  PROCEDURE:  OPEN REDUCTION INTERNAL FIXATION (ORIF) TIBIAL PLATEAU AND OPEN LATERAL MENISCUS REPAIR, REPAIR, MENISCUS, KNEE  SURGEON:  Evalene JONETTA Chancy, MD  ASSISTANT: Gerard Large, PA-C, he was present and scrubbed throughout the case, critical for completion in a timely fashion, and for retraction, instrumentation, and closure.   ANESTHESIA:   gen  PREOPERATIVE INDICATIONS:  [name] is a  43 y.o. female with a diagnosis of LEFT TIBIA PLATEAU FRACTURE who failed conservative measures and elected for surgical management.    The risks benefits and alternatives were discussed with the patient preoperatively including but not limited to the risks of infection, bleeding, nerve injury, cardiopulmonary complications, the need for revision surgery, among others, and the patient was willing to proceed.  OPERATIVE IMPLANTS: stryker plate  OPERATIVE FINDINGS: Unstable fracture lateral meniscus tear the capsule  BLOOD LOSS: Minimal  COMPLICATIONS: None  TOURNIQUET TIME: 70 minutes  OPERATIVE PROCEDURE:  Patient was identified in the preoperative holding area and site was marked by me She was transported to the operating theater and placed on the table in supine position taking care to pad all bony prominences. After a preincinduction time out anesthesia was induced. The left lower extremity was prepped and draped in normal sterile fashion and a pre-incision timeout was performed. She received Ancef  for preoperative antibiotics.   I made a curvilinear incision to the lateral tibial plateau maintaining hemostasis I dissected down to the level of the IT band I incised this longitudinally in the and then elevated off of Gertie's tubercle to allow placement of the plate I performed a  I performed a fasciotomy of the anterior compartment and elevated  anterior compartment off the tibia for placement of the plate  I then performed a submeniscal arthrotomy I noted a partial tear of the lateral meniscus at the capsular junction I placed two 2-0 FiberWire stitches across this meniscus tear repairing that  Next I confirm anatomic reduction of the fracture and pinned a plate into place maintaining fracture alignment with these pins  I placed proximal and distal screws securing this  I took 4 + xrays of the knee confirming appropriate alignment and hardware placement.   Next I repaired the capsulotomy and closed her incision in layers after thorough irrigation  POST OPERATIVE PLAN: Nonweightbearing minimal range of motion

## 2024-01-21 ENCOUNTER — Other Ambulatory Visit: Payer: Self-pay

## 2024-01-24 ENCOUNTER — Other Ambulatory Visit: Payer: Self-pay

## 2024-01-28 ENCOUNTER — Other Ambulatory Visit: Payer: Self-pay

## 2024-01-28 MED ORDER — BACLOFEN 10 MG PO TABS
10.0000 mg | ORAL_TABLET | Freq: Three times a day (TID) | ORAL | 0 refills | Status: AC | PRN
  Filled 2024-01-28: qty 21, 7d supply, fill #0

## 2024-01-28 MED ORDER — OXYCODONE HCL 5 MG PO TABS
5.0000 mg | ORAL_TABLET | ORAL | 0 refills | Status: AC
  Filled 2024-01-28: qty 28, 7d supply, fill #0

## 2024-02-01 ENCOUNTER — Ambulatory Visit: Payer: MEDICAID | Admitting: Internal Medicine

## 2024-02-02 ENCOUNTER — Encounter (HOSPITAL_COMMUNITY): Payer: Self-pay | Admitting: Orthopedic Surgery

## 2024-02-07 ENCOUNTER — Other Ambulatory Visit: Payer: Self-pay

## 2024-02-08 ENCOUNTER — Other Ambulatory Visit: Payer: Self-pay

## 2024-02-10 ENCOUNTER — Other Ambulatory Visit: Payer: Self-pay

## 2024-02-10 ENCOUNTER — Ambulatory Visit: Payer: MEDICAID | Admitting: Internal Medicine

## 2024-02-10 ENCOUNTER — Encounter: Payer: Self-pay | Admitting: Internal Medicine

## 2024-02-10 ENCOUNTER — Ambulatory Visit: Payer: Self-pay | Admitting: Internal Medicine

## 2024-02-10 VITALS — BP 128/76 | HR 80 | Temp 98.2°F | Ht 69.0 in

## 2024-02-10 DIAGNOSIS — E7800 Pure hypercholesterolemia, unspecified: Secondary | ICD-10-CM | POA: Diagnosis not present

## 2024-02-10 DIAGNOSIS — Z23 Encounter for immunization: Secondary | ICD-10-CM

## 2024-02-10 DIAGNOSIS — R7303 Prediabetes: Secondary | ICD-10-CM

## 2024-02-10 DIAGNOSIS — Z0000 Encounter for general adult medical examination without abnormal findings: Secondary | ICD-10-CM

## 2024-02-10 DIAGNOSIS — Z1231 Encounter for screening mammogram for malignant neoplasm of breast: Secondary | ICD-10-CM

## 2024-02-10 DIAGNOSIS — L918 Other hypertrophic disorders of the skin: Secondary | ICD-10-CM

## 2024-02-10 DIAGNOSIS — Z6841 Body Mass Index (BMI) 40.0 and over, adult: Secondary | ICD-10-CM | POA: Diagnosis not present

## 2024-02-10 LAB — CBC WITH DIFFERENTIAL/PLATELET
Basophils Absolute: 0.1 K/uL (ref 0.0–0.1)
Basophils Relative: 0.7 % (ref 0.0–3.0)
Eosinophils Absolute: 0.3 K/uL (ref 0.0–0.7)
Eosinophils Relative: 2.6 % (ref 0.0–5.0)
HCT: 39.9 % (ref 36.0–46.0)
Hemoglobin: 13.4 g/dL (ref 12.0–15.0)
Lymphocytes Relative: 31.3 % (ref 12.0–46.0)
Lymphs Abs: 3.1 K/uL (ref 0.7–4.0)
MCHC: 33.6 g/dL (ref 30.0–36.0)
MCV: 89.9 fl (ref 78.0–100.0)
Monocytes Absolute: 0.6 K/uL (ref 0.1–1.0)
Monocytes Relative: 5.7 % (ref 3.0–12.0)
Neutro Abs: 5.8 K/uL (ref 1.4–7.7)
Neutrophils Relative %: 59.7 % (ref 43.0–77.0)
Platelets: 249 K/uL (ref 150.0–400.0)
RBC: 4.44 Mil/uL (ref 3.87–5.11)
RDW: 13.9 % (ref 11.5–15.5)
WBC: 9.8 K/uL (ref 4.0–10.5)

## 2024-02-10 LAB — HEPATIC FUNCTION PANEL
ALT: 16 U/L (ref 3–35)
AST: 15 U/L (ref 5–37)
Albumin: 4.3 g/dL (ref 3.5–5.2)
Alkaline Phosphatase: 123 U/L — ABNORMAL HIGH (ref 39–117)
Bilirubin, Direct: 0 mg/dL — ABNORMAL LOW (ref 0.1–0.3)
Total Bilirubin: 0.3 mg/dL (ref 0.2–1.2)
Total Protein: 6.9 g/dL (ref 6.0–8.3)

## 2024-02-10 LAB — BASIC METABOLIC PANEL WITH GFR
BUN: 9 mg/dL (ref 6–23)
CO2: 31 meq/L (ref 19–32)
Calcium: 9.5 mg/dL (ref 8.4–10.5)
Chloride: 102 meq/L (ref 96–112)
Creatinine, Ser: 0.87 mg/dL (ref 0.40–1.20)
GFR: 82.5 mL/min
Glucose, Bld: 84 mg/dL (ref 70–99)
Potassium: 4.2 meq/L (ref 3.5–5.1)
Sodium: 140 meq/L (ref 135–145)

## 2024-02-10 LAB — LIPID PANEL
Cholesterol: 203 mg/dL — ABNORMAL HIGH (ref 28–200)
HDL: 39 mg/dL — ABNORMAL LOW
LDL Cholesterol: 102 mg/dL — ABNORMAL HIGH (ref 10–99)
NonHDL: 163.75
Total CHOL/HDL Ratio: 5
Triglycerides: 309 mg/dL — ABNORMAL HIGH (ref 10.0–149.0)
VLDL: 61.8 mg/dL — ABNORMAL HIGH (ref 0.0–40.0)

## 2024-02-10 LAB — TSH: TSH: 2.82 u[IU]/mL (ref 0.35–5.50)

## 2024-02-10 LAB — HEMOGLOBIN A1C: Hgb A1c MFr Bld: 5.6 % (ref 4.6–6.5)

## 2024-02-10 MED ORDER — ALBUTEROL SULFATE HFA 108 (90 BASE) MCG/ACT IN AERS
2.0000 | INHALATION_SPRAY | Freq: Four times a day (QID) | RESPIRATORY_TRACT | 2 refills | Status: AC | PRN
  Filled 2024-02-10: qty 6.7, 25d supply, fill #0

## 2024-02-10 MED ORDER — PHENTERMINE HCL 37.5 MG PO CAPS
37.5000 mg | ORAL_CAPSULE | ORAL | 3 refills | Status: AC
  Filled 2024-02-10: qty 30, 30d supply, fill #0

## 2024-02-10 NOTE — Progress Notes (Signed)
 Patient ID: [name], female   DOB: [birth year], 44 y.o.   MRN: [id number]         Chief Complaint:: wellness exam and Medication Management (Medication for weight loss and would a inhaler prescribed )  , obesity, skin tag, preDM, hld       HPI:  [name] is a 44 y.o. female here for wellness exam; due  for mammogram, due for flu shot today, declines prevnar and Hep B for now, o/w up to date                        Also has large skin tag with irritation near the right axilla.  Unable to lose wt after unfortunate left tibial plateua fracture requiring surgury.  Pt denies chest pain, increased sob or doe, wheezing, orthopnea, PND, increased LE swelling, palpitations, dizziness or syncope.   Pt denies polydipsia, polyuria, or new focal neuro s/s.    Pt denies fever, wt loss, night sweats, loss of appetite, or other constitutional symptoms     Wt Readings from Last 3 Encounters:  01/18/24 (!) 368 lb (166.9 kg)  01/09/24 (!) 360 lb (163.3 kg)  10/18/23 (!) 337 lb 6.4 oz (153 kg)   BP Readings from Last 3 Encounters:  02/10/24 128/76  01/18/24 (!) 131/91  01/09/24 (!) 149/70   Immunization History  Administered Date(s) Administered   Influenza, Seasonal, Injecte, Preservative Fre 02/10/2024   Influenza,inj,Quad PF,6+ Mos 10/20/2016, 11/11/2017   Td 10/22/2009   Tdap 05/11/2015   Health Maintenance Due  Topic Date Due   Hepatitis C Screening  Never done   Pneumococcal Vaccine (1 of 2 - PCV) Never done   Hepatitis B Vaccines 19-59 Average Risk (1 of 3 - 19+ 3-dose series) Never done   Mammogram  Never done      Past Medical History:  Diagnosis Date   ADD 10/22/2009   ALLERGIC RHINITIS 09/26/2008   Anemia    ANXIETY 09/26/2008   Asthma    Complication of anesthesia    tempermental per patient, aggiated and hostile   CONSTIPATION 09/26/2008   DEPRESSION 09/26/2008   FATIGUE 01/14/2009   Head injury with loss of consciousness (HCC)    Headache    HYPERLIPIDEMIA  09/26/2008   IBS (irritable bowel syndrome)    OTITIS MEDIA, LEFT 01/14/2009   SKIN LESION 09/26/2008   Sleep apnea    not formally diagnosed by MD per pt report   TONSILLITIS, ACUTE 01/14/2009   Unconscious state (HCC) 10/13/2017   URI 10/22/2009   Past Surgical History:  Procedure Laterality Date   CERVICAL POLYPECTOMY  2009   CERVICAL POLYPECTOMY  2019   DILATATION & CURETTAGE/HYSTEROSCOPY WITH MYOSURE N/A 11/17/2017   Procedure: DILATATION & CURETTAGE/HYSTEROSCOPY WITH MYOSURE;  Surgeon: Timmie Norris, MD;  Location: [facility];  Service: Gynecology;  Laterality: N/A;   MENISCUS REPAIR Left 01/18/2024   Procedure: REPAIR, MENISCUS, KNEE;  Surgeon: Beverley Evalene BIRCH, MD;  Location: [facility] OR;  Service: Orthopedics;  Laterality: Left;   ORIF ANKLE FRACTURE Right 08/24/2018   Procedure: OPEN REDUCTION INTERNAL FIXATION TRIMALLEOLAR ANKLE FRACTURE;  Surgeon: Barbarann Oneil BROCKS, MD;  Location: [facility] OR;  Service: Orthopedics;  Laterality: Right;   ORIF TIBIA PLATEAU Left 01/18/2024   Procedure: OPEN REDUCTION INTERNAL FIXATION (ORIF) TIBIAL PLATEAU AND OPEN LATERAL MENISCUS REPAIR;  Surgeon: Beverley Evalene BIRCH, MD;  Location: [facility] OR;  Service: Orthopedics;  Laterality: Left;    reports that she has  been smoking cigarettes. She has a 20 pack-year smoking history. She has never used smokeless tobacco. She reports current alcohol use. She reports that she does not use drugs. family history includes Clotting disorder in her maternal grandmother; Diabetes in her maternal aunt; Hypertension in her mother; Irritable bowel syndrome in her mother; Thyroid  disease in her mother. Allergies[1] Medications Ordered Prior to Encounter[2]      ROS:  All others reviewed and negative.  Objective        PE:  BP 128/76 (BP Location: Left Arm, Patient Position: Sitting, Cuff Size: Normal)   Pulse 80   Temp 98.2 F (36.8 C) (Oral)   Ht 5' 9 (1.753 m)   SpO2 95%   BMI 54.34 kg/m                 Constitutional: Pt  appears in NAD               HENT: Head: NCAT.                Right Ear: External ear normal.                 Left Ear: External ear normal.                Eyes: . Pupils are equal, round, and reactive to light. Conjunctivae and EOM are normal               Nose: without d/c or deformity               Neck: Neck supple. Gross normal ROM               Cardiovascular: Normal rate and regular rhythm.                 Pulmonary/Chest: Effort normal and breath sounds without rales or wheezing.                Abd:  Soft, NT, ND, + BS, no organomegaly               Neurological: Pt is alert. At baseline orientation, motor grossly intact               Skin: Skin is warm. No rashes, skin tag noted to right axilla area, LE edema - none               Psychiatric: Pt behavior is normal without agitation   Micro: none  Cardiac tracings I have personally interpreted today:  none  Pertinent Radiological findings (summarize): none   Lab Results  Component Value Date   WBC 9.8 02/10/2024   HGB 13.4 02/10/2024   HCT 39.9 02/10/2024   PLT 249.0 02/10/2024   GLUCOSE 84 02/10/2024   CHOL 203 (H) 02/10/2024   TRIG 309.0 (H) 02/10/2024   HDL 39.00 (L) 02/10/2024   LDLCALC 102 (H) 02/10/2024   ALT 16 02/10/2024   AST 15 02/10/2024   NA 140 02/10/2024   K 4.2 02/10/2024   CL 102 02/10/2024   CREATININE 0.87 02/10/2024   BUN 9 02/10/2024   CO2 31 02/10/2024   TSH 2.82 02/10/2024   HGBA1C 5.6 02/10/2024   MICROALBUR 1.5 05/17/2023   Assessment/Plan:  [name] is a 44 y.o. White or Caucasian [1] female with  has a past medical history of ADD (10/22/2009), ALLERGIC RHINITIS (09/26/2008), Anemia, ANXIETY (09/26/2008), Asthma, Complication of anesthesia, CONSTIPATION (09/26/2008), DEPRESSION (09/26/2008), FATIGUE (01/14/2009), Head injury with loss  of consciousness (HCC), Headache, HYPERLIPIDEMIA (09/26/2008), IBS (irritable bowel syndrome), OTITIS MEDIA, LEFT (01/14/2009), SKIN LESION  (09/26/2008), Sleep apnea, TONSILLITIS, ACUTE (01/14/2009), Unconscious state (HCC) (10/13/2017), and URI (10/22/2009).  Encounter for well adult exam with abnormal findings Age and sex appropriate education and counseling updated with regular exercise and diet Referrals for preventative services - for mammogram Immunizations addressed - declines prevnar and hep B, for flu shot today Smoking counseling  - none needed Evidence for depression or other mood disorder - stable bipolar Most recent labs reviewed. I have personally reviewed and have noted: 1) the patient's medical and social history 2) The patient's current medications and supplements 3) The patient's height, weight, and BMI have been recorded in the chart   Prediabetes Lab Results  Component Value Date   HGBA1C 5.6 02/10/2024   Stable, pt to continue current medical treatment  - diet, wt control   HLD (hyperlipidemia) Lab Results  Component Value Date   LDLCALC 102 (H) 02/10/2024   Mild uncontrolled, declines statin, for lower chol diet   Obesity, morbid (HCC) Pt for phentermine  37.5 mg every day limited trial  Skin tag Mild to mod, for dermatology referral,  to f/u any worsening symptoms or concerns  Preventative health care ge and sex appropriate education and counseling updated with regular exercise and diet Referrals for preventative services - for mammogram Immunizations addressed - declines prevnar and hep B, for flu shot today Smoking counseling  - none needed Evidence for depression or other mood disorder - stable bipolar Most recent labs reviewed. I have personally reviewed and have noted: 1) the patient's medical and social history 2) The patient's current medications and supplements 3) The patient's height, weight, and BMI have been recorded in the chart   Followup: Return in about 6 months (around 08/09/2024).  Lynwood Rush, MD 02/10/2024 8:18 PM [facility] -  [facility]     [1] No Known Allergies [2]  Current Outpatient Medications on File Prior to Visit  Medication Sig Dispense Refill   acetaminophen  (TYLENOL ) 325 MG tablet Take 650 mg by mouth every 6 (six) hours as needed.     acetaminophen  (TYLENOL ) 500 MG tablet Take 2 tablets (1,000 mg total) by mouth every 6 (six) hours as needed for mild pain (pain score 1-3) or moderate pain (pain score 4-6). 60 tablet 0   aspirin  EC 81 MG tablet Take 1 tablet (81 mg total) by mouth 2 (two) times daily. To prevent blood clots for 30 days after surgery. 60 tablet 0   baclofen  (LIORESAL ) 10 MG tablet Take 1 tablet (10 mg total) by mouth every 8 (eight) hours as needed for muscle spasms. 21 tablet 0   baclofen  (LIORESAL ) 10 MG tablet Take 1 tablet (10 mg total) by mouth every 8 (eight) hours as needed FOR MUSCLE SPASMS. 21 tablet 0   buPROPion  (WELLBUTRIN  XL) 300 MG 24 hr tablet Take 1 tablet (300 mg total) by mouth every morning. 30 tablet 3   Drospirenone  (SLYND ) 4 MG TABS Take 1 tablet (4 mg total) by mouth daily. 84 tablet 4   escitalopram  (LEXAPRO ) 20 MG tablet Take 1 tablet (20 mg total) by mouth daily. 30 tablet 3   HYDROcodone -acetaminophen  (NORCO) 10-325 MG tablet Take 1 tablet by mouth every 8 (eight) hours as needed for severe pain in knee from fracture. 21 tablet 0   hydrOXYzine  (ATARAX ) 25 MG tablet Take 1 tablet (25 mg total) by mouth 3 (three)  times daily as needed for anxiety. 90 tablet 3   hyoscyamine  (OSCIMIN ) 0.125 MG tablet Take 1 tablet (0.125 mg total) by mouth every 4 (four) hours as needed for cramping. (Patient not taking: Reported on 01/11/2024) 30 tablet 1   loperamide  (IMODIUM  A-D) 2 MG tablet Take 1 tablet (2 mg total) by mouth 4 (four) times daily as needed for diarrhea or loose stools. (Patient not taking: Reported on 01/11/2024) 24 tablet 0   meloxicam  (MOBIC ) 15 MG tablet Take 1 tablet (15 mg total) by mouth daily as needed for pain (and inflammation). 30  tablet 0   nicotine  polacrilex (NICORETTE ) 2 MG gum Take 1 each (2 mg total) by mouth as needed for smoking cessation. 100 tablet 3   norethindrone  (MICRONOR ) 0.35 MG tablet Take 1 tablet (0.35 mg total) by mouth daily. 84 tablet 4   norethindrone  (MICRONOR ) 0.35 MG tablet Take 1 tablet (0.35 mg total) by mouth daily. 28 tablet 0   OLANZapine  (ZYPREXA ) 20 MG tablet Take 1 tablet (20 mg total) by mouth at bedtime. 30 tablet 3   ondansetron  (ZOFRAN -ODT) 4 MG disintegrating tablet Take 1 tablet (4 mg total) by mouth every 8 (eight) hours as needed. 20 tablet 0   ondansetron  (ZOFRAN -ODT) 4 MG disintegrating tablet Take 1 tablet (4 mg total) by mouth every 8 (eight) hours as needed for nausea or vomiting. 15 tablet 0   oxyCODONE  (OXY IR/ROXICODONE ) 5 MG immediate release tablet Take 1 tablet (5 mg total) by mouth every 6 to 8 hours as needed FOR SEVERE PAIN IN KNEE AFTER SURGERY FOR FRACTURE. 28 tablet 0   oxyCODONE  (ROXICODONE ) 5 MG immediate release tablet Take 1 tablet (5 mg total) by mouth every 6 (six) hours as needed for severe pain (pain score 7-10). in leg after surgery for fracture 28 tablet 0   oxyCODONE -acetaminophen  (PERCOCET/ROXICET) 5-325 MG tablet Take 1 tablet by mouth every 6 (six) hours as needed for severe pain (pain score 7-10). 9 tablet 0   sennosides-docusate sodium  (SENOKOT-S) 8.6-50 MG tablet Take 2 tablets by mouth daily as needed for constipation (while taking narcotics). 30 tablet 1   traZODone  (DESYREL ) 50 MG tablet Take 1 tablet (50 mg total) by mouth at bedtime as needed for sleep. 30 tablet 3   No current facility-administered medications on file prior to visit.

## 2024-02-10 NOTE — Assessment & Plan Note (Signed)
 ge and sex appropriate education and counseling updated with regular exercise and diet Referrals for preventative services - for mammogram Immunizations addressed - declines prevnar and hep B, for flu shot today Smoking counseling  - none needed Evidence for depression or other mood disorder - stable bipolar Most recent labs reviewed. I have personally reviewed and have noted: 1) the patient's medical and social history 2) The patient's current medications and supplements 3) The patient's height, weight, and BMI have been recorded in the chart

## 2024-02-10 NOTE — Assessment & Plan Note (Signed)
 Mild to mod, for dermatology referral,  to f/u any worsening symptoms or concerns

## 2024-02-10 NOTE — Assessment & Plan Note (Signed)
 Age and sex appropriate education and counseling updated with regular exercise and diet Referrals for preventative services - for mammogram Immunizations addressed - declines prevnar and hep B, for flu shot today Smoking counseling  - none needed Evidence for depression or other mood disorder - stable bipolar Most recent labs reviewed. I have personally reviewed and have noted: 1) the patient's medical and social history 2) The patient's current medications and supplements 3) The patient's height, weight, and BMI have been recorded in the chart

## 2024-02-10 NOTE — Assessment & Plan Note (Signed)
 Pt for phentermine  37.5 mg every day limited trial

## 2024-02-10 NOTE — Assessment & Plan Note (Signed)
 Lab Results  Component Value Date   LDLCALC 102 (H) 02/10/2024   Mild uncontrolled, declines statin, for lower chol diet

## 2024-02-10 NOTE — Assessment & Plan Note (Signed)
 Lab Results  Component Value Date   HGBA1C 5.6 02/10/2024   Stable, pt to continue current medical treatment  - diet, wt control

## 2024-02-10 NOTE — Patient Instructions (Signed)
 You had the flu shot today  Ok for phentermine  refill at 37.5 mg  Please continue all other medications as before, and refills have been done for the inhaler  Please have the pharmacy call with any other refills you may need.  Please continue your efforts at being more active, low cholesterol diet, and weight control.  You are otherwise up to date with prevention measures today.  Please keep your appointments with your specialists as you may have planned  You will be contacted regarding the referral for: mammogram, and dermatology  Please go to the LAB at the blood drawing area for the tests to be done  You will be contacted by phone if any changes need to be made immediately.  Otherwise, you will receive a letter about your results with an explanation, but please check with MyChart first.  Please make an Appointment to return in 6 months, or sooner if needed

## 2024-02-11 NOTE — Telephone Encounter (Signed)
 Copied from CRM [id number]. Topic: Clinical - Lab/Test Results >> Feb 11, 2024  3:37 PM Macario HERO wrote: Reason for CRM: Patient called regarding lab test results.

## 2024-02-23 ENCOUNTER — Encounter (HOSPITAL_COMMUNITY): Payer: Self-pay | Admitting: Psychiatry

## 2024-02-23 ENCOUNTER — Telehealth (INDEPENDENT_AMBULATORY_CARE_PROVIDER_SITE_OTHER): Payer: PRIVATE HEALTH INSURANCE | Admitting: Psychiatry

## 2024-02-23 ENCOUNTER — Other Ambulatory Visit: Payer: Self-pay

## 2024-02-23 DIAGNOSIS — F319 Bipolar disorder, unspecified: Secondary | ICD-10-CM | POA: Diagnosis not present

## 2024-02-23 DIAGNOSIS — F17210 Nicotine dependence, cigarettes, uncomplicated: Secondary | ICD-10-CM | POA: Diagnosis not present

## 2024-02-23 DIAGNOSIS — F411 Generalized anxiety disorder: Secondary | ICD-10-CM | POA: Diagnosis not present

## 2024-02-23 DIAGNOSIS — F900 Attention-deficit hyperactivity disorder, predominantly inattentive type: Secondary | ICD-10-CM

## 2024-02-23 DIAGNOSIS — F17200 Nicotine dependence, unspecified, uncomplicated: Secondary | ICD-10-CM

## 2024-02-23 MED ORDER — TRAZODONE HCL 50 MG PO TABS
50.0000 mg | ORAL_TABLET | Freq: Every evening | ORAL | 3 refills | Status: AC | PRN
  Filled 2024-02-23: qty 30, 30d supply, fill #0

## 2024-02-23 MED ORDER — ESCITALOPRAM OXALATE 20 MG PO TABS
20.0000 mg | ORAL_TABLET | Freq: Every day | ORAL | 3 refills | Status: AC
  Filled 2024-02-23: qty 30, 30d supply, fill #0

## 2024-02-23 MED ORDER — OLANZAPINE 20 MG PO TABS
20.0000 mg | ORAL_TABLET | Freq: Every day | ORAL | 3 refills | Status: AC
  Filled 2024-02-23: qty 30, 30d supply, fill #0

## 2024-02-23 MED ORDER — HYDROXYZINE HCL 25 MG PO TABS
25.0000 mg | ORAL_TABLET | Freq: Three times a day (TID) | ORAL | 3 refills | Status: AC | PRN
  Filled 2024-02-23: qty 90, 30d supply, fill #0

## 2024-02-23 MED ORDER — BUPROPION HCL ER (XL) 300 MG PO TB24
300.0000 mg | ORAL_TABLET | ORAL | 3 refills | Status: AC
  Filled 2024-02-23: qty 30, 30d supply, fill #0

## 2024-02-23 MED ORDER — NICOTINE POLACRILEX 2 MG MT GUM
2.0000 mg | CHEWING_GUM | OROMUCOSAL | 3 refills | Status: AC | PRN
  Filled 2024-02-23: qty 100, 30d supply, fill #0

## 2024-02-23 NOTE — Progress Notes (Signed)
 [facility] MD/PA/NP OP Progress Note Virtual Visit via Video Note  I connected with [name] on 02/23/24 at  3:00 PM EST by a video enabled telemedicine application and verified that I am speaking with the correct person using two identifiers.  Location: Patient: Home Provider: Clinic   I discussed the limitations of evaluation and management by telemedicine and the availability of in person appointments. The patient expressed understanding and agreed to proceed.  I provided 30 minutes of non-face-to-face time during this encounter.     02/23/2024 9:50 AM [name]  MRN:  [id number]  Chief Complaint: I think 1 of my medication is making me gain weight    HPI: 44 year old female seen today for follow up psychiatric evaluation.    She has psychiatric history of depression, ADD, bipolar 1 disorder, and anxiety. She is currently managed on Lexapro  20 mg daily, hydroxyzine  25 mg three time daily, Zyprexa  20mg  nightly, NicoDerm 2 mg gum, Wellbutrin  XL 300 mg mg daily,  and trazodone  50mg  nightly as needed. She reports that her medications are effective in managing her psychiatric conditions.   Today she was well-groomed, pleasant, cooperative, and engaged in conversation.  She informed clinical research associate that she believes one of her medications are making her gain weight.  Provider informed patient that Zyprexa  has a side effect of increasing appetite therefore may increase her weight.  Provider also informed patient that she is on phentermine  as well as Wellbutrin  which is helpful in suppressing her appetite.  Patient does note that she has lost 10 pounds and continues to lose weight.  Provider informed patient that she does not recommend switching medications right now but told her that in the future if she would like to switch Lybalvi may be a good option.  She endorsed understanding and agreed.    Since her last visit she notes that she has been doing well.  She informed her mood is stable and  notes that she has minimal anxiety and depression.  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  She inform her that she is somewhat worried about her health but notes that her pain is subsiding.  Today she quantifies her pain as 5/10.  Patient reports that her job is holding her position.  Despite this stressors patient notes that she is able to cope.  Today provider conducted a GAD-7 and patient scored a 4, at her last visit she scored a 6.  Provider also conducted a PHQ-9 the patient scored a 3, at her last visit she scored a 10.Today she denies SI/HI/VAH, mania, paranoia.  She endorses adequate sleep and appetite.  Patient reports that she has been taking phentermine  and has lost 10 pounds.  No medication changes made today.  Patient agreeable to continue medication as prescribed. No other concerns at this time.     Visit Diagnosis:    ICD-10-CM   1. Bipolar 1 disorder (HCC)  F31.9 traZODone  (DESYREL ) 50 MG tablet    OLANZapine  (ZYPREXA ) 20 MG tablet    escitalopram  (LEXAPRO ) 20 MG tablet    2. Tobacco dependence  F17.200 buPROPion  (WELLBUTRIN  XL) 300 MG 24 hr tablet    3. Attention deficit hyperactivity disorder (ADHD), predominantly inattentive type  F90.0 buPROPion  (WELLBUTRIN  XL) 300 MG 24 hr tablet    4. Generalized anxiety disorder  F41.1 OLANZapine  (ZYPREXA ) 20 MG tablet    hydrOXYzine  (ATARAX ) 25 MG tablet  escitalopram  (LEXAPRO ) 20 MG tablet             Past Psychiatric History:  depression, ADD, bipolar 1 disorder, and anxiety  Past Medical History:  Past Medical History:  Diagnosis Date   ADD 10/22/2009   ALLERGIC RHINITIS 09/26/2008   Anemia    ANXIETY 09/26/2008   Asthma    Complication of anesthesia    tempermental per patient, aggiated and hostile   CONSTIPATION 09/26/2008   DEPRESSION 09/26/2008   FATIGUE 01/14/2009   Head injury with loss of consciousness (HCC)    Headache     HYPERLIPIDEMIA 09/26/2008   IBS (irritable bowel syndrome)    OTITIS MEDIA, LEFT 01/14/2009   SKIN LESION 09/26/2008   Sleep apnea    not formally diagnosed by MD per pt report   TONSILLITIS, ACUTE 01/14/2009   Unconscious state (HCC) 10/13/2017   URI 10/22/2009    Past Surgical History:  Procedure Laterality Date   CERVICAL POLYPECTOMY  2009   CERVICAL POLYPECTOMY  2019   DILATATION & CURETTAGE/HYSTEROSCOPY WITH MYOSURE N/A 11/17/2017   Procedure: DILATATION & CURETTAGE/HYSTEROSCOPY WITH MYOSURE;  Surgeon: Timmie Norris, MD;  Location: [facility];  Service: Gynecology;  Laterality: N/A;   MENISCUS REPAIR Left 01/18/2024   Procedure: REPAIR, MENISCUS, KNEE;  Surgeon: Beverley Evalene BIRCH, MD;  Location: [facility] OR;  Service: Orthopedics;  Laterality: Left;   ORIF ANKLE FRACTURE Right 08/24/2018   Procedure: OPEN REDUCTION INTERNAL FIXATION TRIMALLEOLAR ANKLE FRACTURE;  Surgeon: Barbarann Oneil BROCKS, MD;  Location: [facility] OR;  Service: Orthopedics;  Laterality: Right;   ORIF TIBIA PLATEAU Left 01/18/2024   Procedure: OPEN REDUCTION INTERNAL FIXATION (ORIF) TIBIAL PLATEAU AND OPEN LATERAL MENISCUS REPAIR;  Surgeon: Beverley Evalene BIRCH, MD;  Location: [facility] OR;  Service: Orthopedics;  Laterality: Left;    Family Psychiatric History: Brother depression and mother anxiety  Family History:  Family History  Problem Relation Age of Onset   Thyroid  disease Mother    Hypertension Mother    Irritable bowel syndrome Mother    Clotting disorder Maternal Grandmother    Diabetes Maternal Aunt    Colon cancer Neg Hx    Esophageal cancer Neg Hx     Social History:  Social History   Socioeconomic History   Marital status: Single    Spouse name: Not on file   Number of children: 0   Years of education: Not on file   Highest education level: Not on file  Occupational History   Occupation: full time waitress  Tobacco Use   Smoking status: Every Day    Current packs/day: 1.00    Average packs/day: 1 pack/day for  20.0 years (20.0 ttl pk-yrs)    Types: Cigarettes   Smokeless tobacco: Never  Vaping Use   Vaping status: Some Days  Substance and Sexual Activity   Alcohol use: Yes    Comment: occ   Drug use: No   Sexual activity: Not Currently    Birth control/protection: Abstinence  Other Topics Concern   Not on file  Social History Narrative   Lives with mother   Social Drivers of Health   Tobacco Use: High Risk (02/10/2024)   Patient History    Smoking Tobacco Use: Every Day    Smokeless Tobacco Use: Never    Passive Exposure: Not on file  Financial Resource Strain: Not on file  Food Insecurity: Not on file  Transportation Needs: Not on file  Physical Activity: Not on file  Stress: Not on file  Social  Connections: Not on file  Depression (PHQ2-9): Low Risk (02/23/2024)   Depression (PHQ2-9)    PHQ-2 Score: 3  Recent Concern: Depression (PHQ2-9) - Medium Risk (12/09/2023)   Depression (PHQ2-9)    PHQ-2 Score: 10  Alcohol Screen: Not on file  Housing: Not on file  Utilities: Not on file  Health Literacy: Not on file    Allergies: No Known Allergies  Metabolic Disorder Labs: Lab Results  Component Value Date   HGBA1C 5.6 02/10/2024   MPG 105 06/22/2020   MPG 108 07/25/2019   No results found for: PROLACTIN Lab Results  Component Value Date   CHOL 203 (H) 02/10/2024   TRIG 309.0 (H) 02/10/2024   HDL 39.00 (L) 02/10/2024   CHOLHDL 5 02/10/2024   VLDL 61.8 (H) 02/10/2024   LDLCALC 102 (H) 02/10/2024   LDLCALC 143 (H) 05/17/2023   Lab Results  Component Value Date   TSH 2.82 02/10/2024   TSH 2.02 05/17/2023    Therapeutic Level Labs: No results found for: LITHIUM No results found for: VALPROATE Lab Results  Component Value Date   CBMZ 6.8 07/29/2019    Current Medications: Current Outpatient Medications  Medication Sig Dispense Refill   acetaminophen  (TYLENOL ) 325 MG tablet Take 650 mg by mouth every 6 (six) hours as needed.     acetaminophen  (TYLENOL )  500 MG tablet Take 2 tablets (1,000 mg total) by mouth every 6 (six) hours as needed for mild pain (pain score 1-3) or moderate pain (pain score 4-6). 60 tablet 0   albuterol  (VENTOLIN  HFA) 108 (90 Base) MCG/ACT inhaler Inhale 2 puffs into the lungs every 6 (six) hours as needed for wheezing or shortness of breath. 6.7 g 2   aspirin  EC 81 MG tablet Take 1 tablet (81 mg total) by mouth 2 (two) times daily. To prevent blood clots for 30 days after surgery. 60 tablet 0   baclofen  (LIORESAL ) 10 MG tablet Take 1 tablet (10 mg total) by mouth every 8 (eight) hours as needed for muscle spasms. 21 tablet 0   baclofen  (LIORESAL ) 10 MG tablet Take 1 tablet (10 mg total) by mouth every 8 (eight) hours as needed FOR MUSCLE SPASMS. 21 tablet 0   buPROPion  (WELLBUTRIN  XL) 300 MG 24 hr tablet Take 1 tablet (300 mg total) by mouth every morning. 30 tablet 3   Drospirenone  (SLYND ) 4 MG TABS Take 1 tablet (4 mg total) by mouth daily. 84 tablet 4   escitalopram  (LEXAPRO ) 20 MG tablet Take 1 tablet (20 mg total) by mouth daily. 30 tablet 3   HYDROcodone -acetaminophen  (NORCO) 10-325 MG tablet Take 1 tablet by mouth every 8 (eight) hours as needed for severe pain in knee from fracture. 21 tablet 0   hydrOXYzine  (ATARAX ) 25 MG tablet Take 1 tablet (25 mg total) by mouth 3 (three) times daily as needed for anxiety. 90 tablet 3   hyoscyamine  (OSCIMIN ) 0.125 MG tablet Take 1 tablet (0.125 mg total) by mouth every 4 (four) hours as needed for cramping. (Patient not taking: Reported on 01/11/2024) 30 tablet 1   loperamide  (IMODIUM  A-D) 2 MG tablet Take 1 tablet (2 mg total) by mouth 4 (four) times daily as needed for diarrhea or loose stools. (Patient not taking: Reported on 01/11/2024) 24 tablet 0   meloxicam  (MOBIC ) 15 MG tablet Take 1 tablet (15 mg total) by mouth daily as needed for pain (and inflammation). 30 tablet 0   nicotine  polacrilex (NICORETTE ) 2 MG gum Take 1 each (2 mg total)  by mouth as needed for smoking cessation.  100 tablet 3   norethindrone  (MICRONOR ) 0.35 MG tablet Take 1 tablet (0.35 mg total) by mouth daily. 84 tablet 4   norethindrone  (MICRONOR ) 0.35 MG tablet Take 1 tablet (0.35 mg total) by mouth daily. 28 tablet 0   OLANZapine  (ZYPREXA ) 20 MG tablet Take 1 tablet (20 mg total) by mouth at bedtime. 30 tablet 3   ondansetron  (ZOFRAN -ODT) 4 MG disintegrating tablet Take 1 tablet (4 mg total) by mouth every 8 (eight) hours as needed. 20 tablet 0   ondansetron  (ZOFRAN -ODT) 4 MG disintegrating tablet Take 1 tablet (4 mg total) by mouth every 8 (eight) hours as needed for nausea or vomiting. 15 tablet 0   oxyCODONE  (OXY IR/ROXICODONE ) 5 MG immediate release tablet Take 1 tablet (5 mg total) by mouth every 6 to 8 hours as needed FOR SEVERE PAIN IN KNEE AFTER SURGERY FOR FRACTURE. 28 tablet 0   oxyCODONE  (ROXICODONE ) 5 MG immediate release tablet Take 1 tablet (5 mg total) by mouth every 6 (six) hours as needed for severe pain (pain score 7-10). in leg after surgery for fracture 28 tablet 0   oxyCODONE -acetaminophen  (PERCOCET/ROXICET) 5-325 MG tablet Take 1 tablet by mouth every 6 (six) hours as needed for severe pain (pain score 7-10). 9 tablet 0   phentermine  37.5 MG capsule Take 1 capsule (37.5 mg total) by mouth every morning. 30 capsule 3   sennosides-docusate sodium  (SENOKOT-S) 8.6-50 MG tablet Take 2 tablets by mouth daily as needed for constipation (while taking narcotics). 30 tablet 1   traZODone  (DESYREL ) 50 MG tablet Take 1 tablet (50 mg total) by mouth at bedtime as needed for sleep. 30 tablet 3   No current facility-administered medications for this visit.     Musculoskeletal: Strength & Muscle Tone: within normal limits and telehealth visit Gait & Station: normal, telehealth visit Patient leans: N/A  Psychiatric Specialty Exam: Review of Systems  There were no vitals taken for this visit.There is no height or weight on file to calculate BMI.  General Appearance: Well Groomed  Eye  Contact:  Good  Speech:  Clear and Coherent and Normal Rate  Volume:  Normal  Mood:  Euthymic  Affect:  Appropriate and Congruent  Thought Process:  Coherent, Goal Directed, and Linear  Orientation:  Full (Time, Place, and Person)  Thought Content: WDL and Logical   Suicidal Thoughts:  No  Homicidal Thoughts:  No  Memory:  Immediate;   Good Recent;   Good Remote;   Good  Judgement:  Good  Insight:  Good  Psychomotor Activity:  Normal  Concentration:  Concentration: Good and Attention Span: Good  Recall:  Good  Fund of Knowledge: Good  Language: Good  Akathisia:  No  Handed:  Right  AIMS (if indicated): not done  Assets:  Communication Skills Desire for Improvement Financial Resources/Insurance Housing Intimacy Physical Health Social Support  ADL's:  Intact  Cognition: WNL  Sleep:  Good   Screenings: AIMS    Flowsheet Row [facility] ([facility]) [facility]  AIMS Total Score 0 0 0 0   AUDIT    Flowsheet Row Admission (Discharged) from [facility]) [facility] (Dis[facility] ([facility]) [facility]  Alcohol Use Disorder Identification Test Final Score (AUDIT) 0 0 4 0   GAD-7    Flowsheet Row Video Visit from 02/23/2024 in [facility] Video Visit from 12/09/2023 in [facility] Video Visit from 06/17/2023 in [facility] Video Visit from 03/24/2023 in [facility] Video Visit from 01/14/2023 in [facility]  Total GAD-7 Score 4 6 4 5 6    PHQ2-9    Flowsheet Row Video Visit from 02/23/2024 in [facility] Office Visit from 02/10/2024 in [facility] Video Visit from 12/09/2023 in [facility] Video Visit from 06/17/2023 in [facility] Office Visit from 05/17/2023 in [facility]  PHQ-2 Total Score 0 0 2 2 0  PHQ-9 Total Score 3 -- 10 12 --   Flowsheet Row Admission (Discharged) from 01/18/2024 in [facility] from 01/09/2024 in [facility] at [facility] Clinical Support from 09/04/2020 in [facility]  C-SSRS RISK CATEGORY No Risk No Risk No Risk     Assessment and Plan: Patient reports that her depression and anxiety are well managed. No medication changes made today.  Patient agreeable to continue medication as prescribed.  Patient concerned about weight gain but is on Wellbutrin  and phentermine  which is helping to suppress her appetite.  She has lost 10 pounds.  Provider does not recommend switching medications at this time but informed her that if it needs to be switched in the future Lybalvi could be trialed.  1. Tobacco dependence  Continue- nicotine  polacrilex (NICORETTE ) 2 MG gum; Take 1 each (2 mg total) by mouth as needed for smoking cessation.  Dispense: 100 tablet; Refill: 3 Continue- buPROPion  (WELLBUTRIN  XL) 300 MG 24 hr tablet; Take 1 tablet (300 mg total) by mouth every morning.  Dispense: 30 tablet; Refill: 3  2. Bipolar 1 disorder (HCC)  Continue- escitalopram  (LEXAPRO ) 20 MG tablet; Take 1 tablet (20 mg total) by mouth daily.  Dispense: 30 tablet; Refill: 3 - OLANZapine  (ZYPREXA ) 20 MG tablet; Take 1 tablet  (20 mg total) by mouth at bedtime.  Dispense: 30 tablet; Refill: 3 Continue- traZODone  (DESYREL ) 50 MG tablet; Take 1 tablet (50 mg total) by mouth at bedtime as needed for sleep.  Dispense: 30 tablet; Refill: 3  3. Generalized anxiety disorder  Continue- escitalopram  (LEXAPRO ) 20 MG tablet; Take 1 tablet (20 mg total) by mouth daily.  Dispense: 30 tablet; Refill: 3 Continue- OLANZapine  (ZYPREXA ) 20 MG tablet; Take 1 tablet (20 mg total) by mouth at bedtime.  Dispense: 30 tablet; Refill: 3 Continue- hydrOXYzine  (ATARAX ) 25 MG tablet; Take 1 tablet (25 mg total) by mouth 3 (three) times daily as needed for anxiety.  Dispense: 90 tablet; Refill: 3  4. Attention deficit hyperactivity disorder (ADHD), predominantly inattentive type  Continue- buPROPion  (WELLBUTRIN  XL) 300 MG 24 hr tablet; Take 1 tablet (300 mg total) by mouth every morning.  Dispense: 30 tablet; Refill: 3   Follow up in 2.5 months Follow up with therapy  Zane FORBES Bach, NP 02/23/2024, 9:50 AM

## 2024-03-03 ENCOUNTER — Other Ambulatory Visit: Payer: Self-pay

## 2024-03-28 ENCOUNTER — Ambulatory Visit: Payer: PRIVATE HEALTH INSURANCE

## 2024-04-26 ENCOUNTER — Telehealth (HOSPITAL_COMMUNITY): Payer: PRIVATE HEALTH INSURANCE | Admitting: Psychiatry

## 2024-10-25 ENCOUNTER — Ambulatory Visit: Payer: PRIVATE HEALTH INSURANCE | Admitting: Physician Assistant
